# Patient Record
Sex: Female | Born: 1945 | Race: White | Hispanic: No | Smoking: Current every day smoker
Health system: Southern US, Community
[De-identification: ages and names within clinical notes are randomized; demographics above are authoritative.]

## PROBLEM LIST (undated history)

## (undated) ENCOUNTER — Encounter

## (undated) ENCOUNTER — Telehealth

## (undated) ENCOUNTER — Ambulatory Visit

## (undated) ENCOUNTER — Encounter
Attending: Student in an Organized Health Care Education/Training Program | Primary: Student in an Organized Health Care Education/Training Program

## (undated) ENCOUNTER — Ambulatory Visit: Payer: MEDICARE

## (undated) ENCOUNTER — Telehealth
Attending: Student in an Organized Health Care Education/Training Program | Primary: Student in an Organized Health Care Education/Training Program

## (undated) ENCOUNTER — Ambulatory Visit
Payer: MEDICARE | Attending: Student in an Organized Health Care Education/Training Program | Primary: Student in an Organized Health Care Education/Training Program

## (undated) ENCOUNTER — Encounter: Attending: Dermatology | Primary: Dermatology

## (undated) DIAGNOSIS — M1990 Unspecified osteoarthritis, unspecified site: Secondary | ICD-10-CM

## (undated) DIAGNOSIS — I639 Cerebral infarction, unspecified: Secondary | ICD-10-CM

## (undated) DIAGNOSIS — H9190 Unspecified hearing loss, unspecified ear: Secondary | ICD-10-CM

## (undated) DIAGNOSIS — Z974 Presence of external hearing-aid: Secondary | ICD-10-CM

## (undated) DIAGNOSIS — Z8639 Personal history of other endocrine, nutritional and metabolic disease: Secondary | ICD-10-CM

## (undated) DIAGNOSIS — I10 Essential (primary) hypertension: Secondary | ICD-10-CM

## (undated) HISTORY — DX: Cerebral infarction, unspecified: I63.9

## (undated) HISTORY — PX: ABDOMINAL HYSTERECTOMY: SHX81

---

## 2011-03-07 ENCOUNTER — Inpatient Hospital Stay: Payer: Self-pay | Admitting: Internal Medicine

## 2011-04-06 ENCOUNTER — Ambulatory Visit: Payer: Self-pay | Admitting: Internal Medicine

## 2011-08-11 LAB — CK TOTAL AND CKMB (NOT AT ARMC): CK, Total: 93 U/L (ref 21–215)

## 2011-08-11 LAB — COMPREHENSIVE METABOLIC PANEL
Albumin: 4 g/dL (ref 3.4–5.0)
Alkaline Phosphatase: 59 U/L (ref 50–136)
BUN: 26 mg/dL — ABNORMAL HIGH (ref 7–18)
Chloride: 104 mmol/L (ref 98–107)
Creatinine: 1.04 mg/dL (ref 0.60–1.30)
EGFR (African American): >60
Glucose: 83 mg/dL (ref 65–99)
Potassium: 3.7 mmol/L (ref 3.5–5.1)
SGOT(AST): 17 U/L (ref 15–37)

## 2011-08-11 LAB — URINALYSIS, COMPLETE
Bilirubin,UR: NEGATIVE
Glucose,UR: NEGATIVE mg/dL (ref 0–75)
Hyaline Cast: 3
Ketone: NEGATIVE
Leukocyte Esterase: NEGATIVE
Ph: 6 (ref 4.5–8.0)
Specific Gravity: 1.011 (ref 1.003–1.030)
Squamous Epithelial: 2

## 2011-08-11 LAB — CBC
HCT: 43.8 % (ref 35.0–47.0)
HGB: 14.8 g/dL (ref 12.0–16.0)
MCV: 100 fL (ref 80–100)
Platelet: 235 10*3/uL (ref 150–440)
RDW: 13 % (ref 11.5–14.5)
WBC: 10.9 10*3/uL (ref 3.6–11.0)

## 2011-08-11 LAB — PROTIME-INR: Prothrombin Time: 13.4 secs (ref 11.5–14.7)

## 2011-08-11 LAB — APTT: Activated PTT: 29 secs (ref 23.6–35.9)

## 2011-08-12 ENCOUNTER — Observation Stay: Payer: Self-pay | Admitting: Internal Medicine

## 2013-01-29 ENCOUNTER — Ambulatory Visit: Payer: Self-pay | Admitting: Internal Medicine

## 2013-03-02 ENCOUNTER — Ambulatory Visit: Payer: Self-pay

## 2014-04-16 DIAGNOSIS — I10 Essential (primary) hypertension: Secondary | ICD-10-CM | POA: Insufficient documentation

## 2014-04-16 DIAGNOSIS — I679 Cerebrovascular disease, unspecified: Secondary | ICD-10-CM | POA: Insufficient documentation

## 2014-04-16 DIAGNOSIS — E0520 Thyrotoxicosis with toxic multinodular goiter without thyrotoxic crisis or storm: Secondary | ICD-10-CM | POA: Insufficient documentation

## 2014-04-16 DIAGNOSIS — E785 Hyperlipidemia, unspecified: Secondary | ICD-10-CM | POA: Insufficient documentation

## 2014-10-20 NOTE — H&P (Signed)
PATIENT NAME:  Victoria Gutierrez MR#:  [id number] DATE OF BIRTH:  Jul 05, 1945  DATE OF ADMISSION:  08/12/2011  PRIMARY CARE PHYSICIAN: Dr. Lisette Grinder   CHIEF COMPLAINT: Slurred speech and disorientation.   HISTORY OF PRESENT ILLNESS: [name] is 69 year old Caucasian female with history of systemic hypertension and hyperlipidemia and chronic smoker. Patient was admitted September of last year with transient ischemic attack, however, her MRI was consistent with lacunar infarct. This time the daughter called EMS and patient was transported to the Emergency Department after an episode of slurred speech that lasted more than one hour associated with disorientation and confusion. The patient could not state her name when she was asked nor she could specify the date and could not name her son or her daughter. She was also noted to be fidgety, keep moving her left arm in a repetitive pattern and this was new. All these symptoms had resolved by the time she came to the Emergency Department. The patient denies having any headache. No blurring of vision. No double vision. No focal weakness.    REVIEW OF SYSTEMS: CONSTITUTIONAL: Denies any fever. No chills. No night sweats. No fatigue. EYES: Denies any blurring of vision. No double vision. ENT: No hearing impairment. No sore throat. No dysphagia. CARDIOVASCULAR: No chest pain. No shortness of breath. No edema. No syncope. RESPIRATORY: No cough. No sputum production. No chest pain. No shortness of breath. GASTROINTESTINAL: No abdominal pain. No nausea. No vomiting. No diarrhea. GENITOURINARY: No dysuria or frequency of urination. MUSCULOSKELETAL: No joint swelling or pain. No muscular pain or swelling. INTEGUMENTARY: No skin rash. No ulcers. NEUROLOGIC: No focal weakness. No seizure activity. No headache. She had an episode of disorientation and slurred speech as stated above. PSYCHIATRY: No anxiety. There is history of depression. ENDOCRINE: No heat or cold intolerance.  No night sweats. No nocturia.   PAST MEDICAL HISTORY:  1. History of systemic hypertension.  2. History of tobacco abuse.  3. History of hypercholesterolemia.  4. History of stroke in September 2012.   PAST SURGICAL HISTORY: History of hysterectomy.   SOCIAL HABITS: Chronic heavy smoker, 1 pack per day since age of 79. No history of alcohol or other drug abuse.   FAMILY HISTORY: Her mother suffered from stroke and hypertension.   SOCIAL HISTORY: She lives at home alone, cared by her daughter who keeps an eye on her. She works as Probation officer.   ADMISSION MEDICATIONS:  1. Simvastatin 40 mg once a day.  2. Potassium chloride 10 mEq once a day. 3. Metoprolol 100 mg twice a day. 4. Lisinopril 20 mg a day. 5. Hydrochlorothiazide 12.5 mg once a day. 6. Aspirin 325 mg once a day.  7. Citalopram 20 mg once a day.   ALLERGIES: No known drug allergies.   PHYSICAL EXAMINATION:  VITAL SIGNS: Blood pressure 157/78, respiratory rate 18, pulse 64, temperature 98.5, oxygen saturation 94%.   GENERAL APPEARANCE: Elderly female, healthy looking, in no acute distress. She appears to be upset that her daughter called EMS and brought to the Emergency Department.   HEAD/NECK: No pallor. No icterus. No cyanosis.   ENT: Hearing was normal. Nasal mucosa, lips, tongue were normal.   EYES: Normal iris and conjunctivae. Pupils about 4 mm, equal and reactive to light.   NECK: Supple. Trachea at midline. No thyromegaly. No cervical lymphadenopathy.   HEART: Normal S1, S2. No S3, S4. There is grade 2 faint systolic murmur at the aortic area. No carotid bruits.   RESPIRATORY:  Normal breathing pattern without use of accessory muscles. No rales. No wheezing.   ABDOMEN: Soft without tenderness. No hepatosplenomegaly. No masses. No hernias.   MUSCULOSKELETAL: No joint swelling. No clubbing.   SKIN: No ulcers. No subcutaneous nodules.   NEUROLOGIC: Cranial nerves II through XII were intact. No focal  motor deficit. Coordination movements and cerebellar signs were normal. Patient has no slurred speech at the time of my examination.   PSYCHIATRIC: Patient is alert, oriented to the place, the people and time. This is different than earlier when she was disoriented.   LABORATORY, DIAGNOSTIC AND RADIOLOGICAL DATA: CAT scan of the head showed no acute findings. 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 EKG showed normal sinus rhythm at rate of 62 per minute, Q waves in leads III and aVF. Incomplete right bundle branch block, otherwise unremarkable EKG and this is unchanged compared to her EKG in September 2012. Serum glucose 83, BUN 26, creatinine 1, sodium 142, potassium 3.7. Her liver function tests were normal. CPK 93. Troponin less than 0.02. CBC showed white count 10,000, hemoglobin 14, hematocrit 43, platelet count 235. Prothrombin time 13, INR 1. APTT 29. Urinalysis was unremarkable.   IMPRESSION:  1. An episode of slurred speech associated with confusion and disorientation lasted more than an hour and subsided. Symptoms are consistent with transient ischemic attack versus another lacunar infarct.  2. Systemic hypertension.  3. Hypercholesterolemia. 4. Ongoing tobacco abuse.  5. History of stroke in September 2012.   PLAN: Admit the patient to the medical floor and will place on telemetry. I will change aspirin to Plavix 75 mg a day. Continue her home medications, in particular her blood pressure medications. Frequent neurologic examination and follow up. Neurology consultation with Dr. Manuella Ghazi. I ordered MRI for the morning. The patient had echocardiogram done in September of last year and that showed normal findings, no evidence of thrombosis and ejection fraction was 55%. There is a note from the primary care that she had carotid ultrasound during that admission, however, I did not see that report in the medical records. The  patient herself indicates that she did have carotid ultrasound done at that time. Patient has to quit smoking and I placed her on nicotine patch. For deep vein thrombosis prophylaxis I placed her on Lovenox 40 mg subcutaneous once a day and for peptic ulcer disease prophylaxis on Protonix 40 mg a day.   TIME NEEDED TO EVALUATE THIS PATIENT: Took more than 55 minutes.   ____________________________ Clovis Pu. Lenore Manner, MD amd:cms D: 08/12/2011 02:23:39 ET T: 08/12/2011 08:21:17 ET JOB#: [id number]  cc: Clovis Pu. Lenore Manner, MD, <Dictator> John B. Sarina Ser, MD Mike Craze Irven Coe MD ELECTRONICALLY SIGNED 08/20/2011 22:16

## 2014-12-10 DIAGNOSIS — Z5181 Encounter for therapeutic drug level monitoring: Secondary | ICD-10-CM | POA: Diagnosis not present

## 2014-12-10 DIAGNOSIS — L400 Psoriasis vulgaris: Secondary | ICD-10-CM | POA: Diagnosis not present

## 2014-12-30 ENCOUNTER — Emergency Department
Admission: EM | Admit: 2014-12-30 | Discharge: 2014-12-30 | Disposition: A | Discharge status: Home / Self Care | Payer: Commercial Managed Care - HMO | Attending: Student | Admitting: Student

## 2014-12-30 ENCOUNTER — Emergency Department: Payer: Commercial Managed Care - HMO

## 2014-12-30 ENCOUNTER — Other Ambulatory Visit: Payer: Self-pay

## 2014-12-30 ENCOUNTER — Encounter: Payer: Self-pay | Admitting: Radiology

## 2014-12-30 DIAGNOSIS — S299XXA Unspecified injury of thorax, initial encounter: Secondary | ICD-10-CM | POA: Diagnosis not present

## 2014-12-30 DIAGNOSIS — I701 Atherosclerosis of renal artery: Secondary | ICD-10-CM | POA: Diagnosis not present

## 2014-12-30 DIAGNOSIS — R109 Unspecified abdominal pain: Secondary | ICD-10-CM | POA: Diagnosis not present

## 2014-12-30 DIAGNOSIS — I10 Essential (primary) hypertension: Secondary | ICD-10-CM | POA: Diagnosis not present

## 2014-12-30 DIAGNOSIS — M549 Dorsalgia, unspecified: Secondary | ICD-10-CM | POA: Diagnosis not present

## 2014-12-30 DIAGNOSIS — I700 Atherosclerosis of aorta: Secondary | ICD-10-CM | POA: Diagnosis not present

## 2014-12-30 DIAGNOSIS — J984 Other disorders of lung: Secondary | ICD-10-CM | POA: Diagnosis not present

## 2014-12-30 DIAGNOSIS — R112 Nausea with vomiting, unspecified: Secondary | ICD-10-CM | POA: Diagnosis not present

## 2014-12-30 DIAGNOSIS — M546 Pain in thoracic spine: Secondary | ICD-10-CM | POA: Insufficient documentation

## 2014-12-30 HISTORY — DX: Essential (primary) hypertension: I10

## 2014-12-30 LAB — URINALYSIS COMPLETE WITH MICROSCOPIC (ARMC ONLY)
Bilirubin Urine: NEGATIVE
Glucose, UA: 50 mg/dL — AB
Leukocytes, UA: NEGATIVE
NITRITE: NEGATIVE
PH: 7 (ref 5.0–8.0)
Protein, ur: 30 mg/dL — AB
Specific Gravity, Urine: 1.018 (ref 1.005–1.030)

## 2014-12-30 LAB — COMPREHENSIVE METABOLIC PANEL
ALT: 23 U/L (ref 14–54)
ANION GAP: 9 (ref 5–15)
AST: 24 U/L (ref 15–41)
Albumin: 4.4 g/dL (ref 3.5–5.0)
Alkaline Phosphatase: 58 U/L (ref 38–126)
BUN: 12 mg/dL (ref 6–20)
CO2: 27 mmol/L (ref 22–32)
Calcium: 9.1 mg/dL (ref 8.9–10.3)
Chloride: 102 mmol/L (ref 101–111)
Creatinine, Ser: 0.85 mg/dL (ref 0.44–1.00)
GFR calc non Af Amer: >60 mL/min (ref >60)
Glucose, Bld: 157 mg/dL — ABNORMAL HIGH (ref 65–99)
Potassium: 3.7 mmol/L (ref 3.5–5.1)
Sodium: 138 mmol/L (ref 135–145)
Total Bilirubin: 1 mg/dL (ref 0.3–1.2)
Total Protein: 7 g/dL (ref 6.5–8.1)

## 2014-12-30 LAB — CBC WITH DIFFERENTIAL/PLATELET
Basophils Absolute: 0 10*3/uL (ref 0–0.1)
Basophils Relative: 0 %
EOS ABS: 0.1 10*3/uL (ref 0–0.7)
Eosinophils Relative: 1 %
HCT: 44 % (ref 35.0–47.0)
HEMOGLOBIN: 15.1 g/dL (ref 12.0–16.0)
Lymphocytes Relative: 13 %
Lymphs Abs: 1.4 10*3/uL (ref 1.0–3.6)
MCH: 33.8 pg (ref 26.0–34.0)
MCHC: 34.4 g/dL (ref 32.0–36.0)
MCV: 98.3 fL (ref 80.0–100.0)
Monocytes Absolute: 0.3 10*3/uL (ref 0.2–0.9)
Monocytes Relative: 3 %
Neutro Abs: 9.1 10*3/uL — ABNORMAL HIGH (ref 1.4–6.5)
Neutrophils Relative %: 83 %
Platelets: 204 10*3/uL (ref 150–440)
RBC: 4.47 MIL/uL (ref 3.80–5.20)
RDW: 13.3 % (ref 11.5–14.5)
WBC: 10.9 10*3/uL (ref 3.6–11.0)

## 2014-12-30 LAB — LIPASE, BLOOD: Lipase: 35 U/L (ref 22–51)

## 2014-12-30 LAB — TROPONIN I

## 2014-12-30 MED ORDER — ONDANSETRON HCL 4 MG PO TABS: 4.0000 mg | ORAL_TABLET | Freq: Four times a day (QID) | ORAL | Status: DC | PRN

## 2014-12-30 MED ORDER — IOHEXOL 350 MG/ML SOLN
100.0000 mL | Freq: Once | INTRAVENOUS | Status: AC | PRN
  Administered 2014-12-30: 100 mL via INTRAVENOUS

## 2014-12-30 MED ORDER — ONDANSETRON HCL 4 MG/2ML IJ SOLN
INTRAMUSCULAR | Status: AC
  Administered 2014-12-30: 4 mg via INTRAVENOUS
  Filled 2014-12-30: qty 2

## 2014-12-30 MED ORDER — MORPHINE SULFATE 4 MG/ML IJ SOLN
INTRAMUSCULAR | Status: AC
  Administered 2014-12-30: 4 mg via INTRAVENOUS
  Filled 2014-12-30: qty 1

## 2014-12-30 MED ORDER — DIAZEPAM 2 MG PO TABS: 2.0000 mg | ORAL_TABLET | Freq: Three times a day (TID) | ORAL | Status: AC | PRN

## 2014-12-30 MED ORDER — SODIUM CHLORIDE 0.9 % IV BOLUS (SEPSIS)
1000.0000 mL | Freq: Once | INTRAVENOUS | Status: AC
  Administered 2014-12-30: 1000 mL via INTRAVENOUS

## 2014-12-30 MED ORDER — MORPHINE SULFATE 4 MG/ML IJ SOLN
4.0000 mg | Freq: Once | INTRAMUSCULAR | Status: AC
  Administered 2014-12-30: 4 mg via INTRAVENOUS

## 2014-12-30 MED ORDER — ONDANSETRON HCL 4 MG/2ML IJ SOLN
4.0000 mg | Freq: Once | INTRAMUSCULAR | Status: AC
  Administered 2014-12-30: 4 mg via INTRAVENOUS

## 2014-12-30 NOTE — ED Notes (Signed)
Pt reports bending down earlier today and having sudden onset of right sided back pain and nausea and vomiting x 2. Pt denies other symptom. No tenderness upon palpation of abd or right side of pts back.

## 2014-12-30 NOTE — ED Provider Notes (Signed)
[facility] Emergency Department Provider Note  ____________________________________________  Time seen: Approximately 6:41 PM  I have reviewed the triage vital signs and the nursing notes.   HISTORY  Chief Complaint Back Pain and Nausea    HPI Victoria Gutierrez is a 69 y.o. female with history of hypertension, hyperlipidemia, prior CVA presents for evaluation of sudden right back/flank pain which occurred just prior to arrival, constant since onset. Patient reports that she was bending down earlier when she developed the pain. It is described as sharp. Currently her pain is 10 out of 10/severe. It does not radiate. She has had associated nausea as well as 2 episodes of nonbloody nonbilious emesis. No chest pain, difficulty breathing, diarrhea, dysuria or hematuria. Prior to this morning, she had been in her usual state of health other than productive cough last week. Movement does not make her pain worse.   Past Medical History  Diagnosis Date  . Hypertension     There are no active problems to display for this patient.   No past surgical history on file.  No current outpatient prescriptions on file.  Allergies Review of patient's allergies indicates no known allergies.  No family history on file.  Social History History  Substance Use Topics  . Smoking status: Not on file  . Smokeless tobacco: Not on file  . Alcohol Use: Not on file   +smoker  Review of Systems Constitutional: No fever/chills Eyes: No visual changes. ENT: No sore throat. Cardiovascular: Denies chest pain. Respiratory: Denies shortness of breath. Gastrointestinal: No abdominal pain.  + nausea, + vomiting.  No diarrhea.  No constipation. Genitourinary: Negative for dysuria. Musculoskeletal: + for right flank/back pain. Skin: Negative for rash. Neurological: Negative for headaches, focal weakness or numbness.  10-point ROS otherwise  negative.  ____________________________________________   PHYSICAL EXAM:  VITAL SIGNS: ED Triage Vitals  Enc Vitals Group     BP 12/30/14 1839 176/85 mmHg     Pulse Rate 12/30/14 1839 62     Resp 12/30/14 1839 16     Temp 12/30/14 1839 98.5 F (36.9 C)     Temp Source 12/30/14 1839 Oral     SpO2 12/30/14 1839 99 %     Weight 12/30/14 1839 165 lb (74.844 kg)     Height 12/30/14 1839 5\' 6"  (1.676 m)     Head Cir --      Peak Flow --      Pain Score 12/30/14 1834 8     Pain Loc --      Pain Edu? --      Excl. in [place]? --     Constitutional: Alert and oriented. Appears pale and nauseated. Eyes: Conjunctivae are normal. PERRL. EOMI. Head: Atraumatic. Nose: No congestion/rhinnorhea. Mouth/Throat: Mucous membranes are moist.  Oropharynx non-erythematous. Neck: No stridor.   Cardiovascular: Normal rate, regular rhythm. Grossly normal heart sounds.  Good peripheral circulation. Respiratory: Normal respiratory effort.  No retractions. Lungs CTAB. Gastrointestinal: Soft and nontender. No distention. No abdominal bruits. No CVA tenderness. Genitourinary: deferred Musculoskeletal: No lower extremity tenderness nor edema.  No joint effusions. Neurologic:  Normal speech and language. No gross focal neurologic deficits are appreciated. Speech is normal. No gait instability. Skin:  Skin is warm, dry and intact. No rash noted. Psychiatric: Mood and affect are normal. Speech and behavior are normal.  ____________________________________________   LABS (all labs ordered are listed, but only abnormal results are displayed)  Labs Reviewed  CBC WITH DIFFERENTIAL/PLATELET - Abnormal; Notable  for the following:    Neutro Abs 9.1 (*)    All other components within normal limits  COMPREHENSIVE METABOLIC PANEL - Abnormal; Notable for the following:    Glucose, Bld 157 (*)    All other components within normal limits  URINALYSIS COMPLETEWITH MICROSCOPIC ([facility] ONLY) - Abnormal; Notable for the  following:    Color, Urine YELLOW (*)    APPearance HAZY (*)    Glucose, UA 50 (*)    Ketones, ur TRACE (*)    Hgb urine dipstick 1+ (*)    Protein, ur 30 (*)    Bacteria, UA RARE (*)    Squamous Epithelial / LPF 6-30 (*)    All other components within normal limits  LIPASE, BLOOD  TROPONIN I   ____________________________________________  EKG  ED ECG REPORT I, Joanne Gavel, the attending physician, personally viewed and interpreted this ECG.   Date: 12/30/2014  EKG Time: 18:50  Rate: 54  Rhythm: sinus bradycardia with marked sinus arrhythmia  Axis: Normal  Intervals:right bundle branch block  ST&T Change: No acute ST segment elevation, Q waves in lead 3, aVF  ____________________________________________  RADIOLOGY  CXR  IMPRESSION: Borderline cardiomegaly. No acute or active process. No fracture seen.   ____________________________________________   PROCEDURES  Procedure(s) performed: None  Critical Care performed: No  ____________________________________________   INITIAL IMPRESSION / ASSESSMENT AND PLAN / ED COURSE  Pertinent labs & imaging results that were available during my care of the patient were reviewed by me and considered in my medical decision making (see chart for details).  Victoria Gutierrez is a 69 y.o. female with history of hypertension, hyperlipidemia, prior CVA presents for evaluation of sudden right upper back/flank pain which occurred just prior to arrival. On exam, she appears quite nauseated, pale, is in distress secondary to pain. She has no tenderness to palpation throughout the back, no CVA tenderness, no change in pain with movement of the back/rotation of the torso. Plan for screening labs, EKG, CXR, urinalysis, antiemetics and pain control as well as IV fluids. Discussed EKG with Dr. Acie Fredrickson who has reviewed EKG and reports it is most consistent with right bundle-branch block, no  STEMI.  ----------------------------------------- 9:05 PM on 12/30/2014 ----------------------------------------- Labs reviewed and are generally unremarkable. Troponin negative. Urinalysis is notable for 6-30 red blood cells with multiple squamous cells. Her symptoms may represent nephrolithiasis however she is complaining of pain in the upper back just under the scapula (somewhat atypical for kidney stone) and given risk factors, somewhat ill appearance on arrival, will obtain CTA chest abdomen pelvis to rule out dissection. Pain and nausea have improved at this time. Care transferred to Dr. Joni Fears. ____________________________________________   FINAL CLINICAL IMPRESSION(S) / ED DIAGNOSES  Final diagnoses:  Flank pain, acute       Joanne Gavel, MD 12/30/14 2107

## 2014-12-30 NOTE — ED Provider Notes (Signed)
-----------------------------------------   11:29 PM on 12/30/2014 -----------------------------------------  Workup unremarkable. Repeat exam reveals patient is calm and comfortable sitting upright, drinking fluids. She has tenderness in the right paraspinous tissues of the back which reproduces her pain. This appears to be a musculoskeletal back pain and muscle strain. Workup is unremarkable and reassuring, she is medically stable good condition we'll discharge home to follow up with primary care.  Carrie Mew, MD 12/30/14 2329

## 2015-05-02 DIAGNOSIS — I679 Cerebrovascular disease, unspecified: Secondary | ICD-10-CM | POA: Diagnosis not present

## 2015-05-02 DIAGNOSIS — Z9119 Patient's noncompliance with other medical treatment and regimen: Secondary | ICD-10-CM | POA: Diagnosis not present

## 2015-05-02 DIAGNOSIS — I10 Essential (primary) hypertension: Secondary | ICD-10-CM | POA: Diagnosis not present

## 2015-05-02 DIAGNOSIS — E7800 Pure hypercholesterolemia, unspecified: Secondary | ICD-10-CM | POA: Diagnosis not present

## 2015-07-14 DIAGNOSIS — H2513 Age-related nuclear cataract, bilateral: Secondary | ICD-10-CM | POA: Diagnosis not present

## 2015-07-29 DIAGNOSIS — I10 Essential (primary) hypertension: Secondary | ICD-10-CM | POA: Diagnosis not present

## 2015-07-29 DIAGNOSIS — E7800 Pure hypercholesterolemia, unspecified: Secondary | ICD-10-CM | POA: Diagnosis not present

## 2015-08-05 DIAGNOSIS — E0520 Thyrotoxicosis with toxic multinodular goiter without thyrotoxic crisis or storm: Secondary | ICD-10-CM | POA: Diagnosis not present

## 2015-08-05 DIAGNOSIS — R319 Hematuria, unspecified: Secondary | ICD-10-CM | POA: Diagnosis not present

## 2015-08-05 DIAGNOSIS — Z720 Tobacco use: Secondary | ICD-10-CM | POA: Diagnosis not present

## 2015-08-05 DIAGNOSIS — I679 Cerebrovascular disease, unspecified: Secondary | ICD-10-CM | POA: Diagnosis not present

## 2015-08-05 DIAGNOSIS — I10 Essential (primary) hypertension: Secondary | ICD-10-CM | POA: Diagnosis not present

## 2015-08-05 DIAGNOSIS — E2839 Other primary ovarian failure: Secondary | ICD-10-CM | POA: Diagnosis not present

## 2015-08-05 DIAGNOSIS — Z0001 Encounter for general adult medical examination with abnormal findings: Secondary | ICD-10-CM | POA: Diagnosis not present

## 2015-08-05 DIAGNOSIS — E7800 Pure hypercholesterolemia, unspecified: Secondary | ICD-10-CM | POA: Diagnosis not present

## 2015-08-14 DIAGNOSIS — H04123 Dry eye syndrome of bilateral lacrimal glands: Secondary | ICD-10-CM | POA: Diagnosis not present

## 2016-02-06 DIAGNOSIS — I10 Essential (primary) hypertension: Secondary | ICD-10-CM | POA: Diagnosis not present

## 2016-02-11 DIAGNOSIS — I10 Essential (primary) hypertension: Secondary | ICD-10-CM | POA: Diagnosis not present

## 2016-02-11 DIAGNOSIS — E7800 Pure hypercholesterolemia, unspecified: Secondary | ICD-10-CM | POA: Diagnosis not present

## 2016-03-29 DIAGNOSIS — L400 Psoriasis vulgaris: Secondary | ICD-10-CM | POA: Diagnosis not present

## 2016-04-26 DIAGNOSIS — L400 Psoriasis vulgaris: Secondary | ICD-10-CM | POA: Diagnosis not present

## 2016-04-27 DIAGNOSIS — Z0000 Encounter for general adult medical examination without abnormal findings: Secondary | ICD-10-CM | POA: Diagnosis not present

## 2016-07-29 DIAGNOSIS — L400 Psoriasis vulgaris: Secondary | ICD-10-CM | POA: Diagnosis not present

## 2016-07-29 DIAGNOSIS — M2550 Pain in unspecified joint: Secondary | ICD-10-CM | POA: Diagnosis not present

## 2016-08-25 DIAGNOSIS — L409 Psoriasis, unspecified: Secondary | ICD-10-CM | POA: Diagnosis not present

## 2016-08-25 DIAGNOSIS — M2550 Pain in unspecified joint: Secondary | ICD-10-CM | POA: Diagnosis not present

## 2016-08-25 DIAGNOSIS — L408 Other psoriasis: Secondary | ICD-10-CM | POA: Diagnosis not present

## 2016-08-25 DIAGNOSIS — M151 Heberden's nodes (with arthropathy): Secondary | ICD-10-CM | POA: Diagnosis not present

## 2016-09-23 DIAGNOSIS — M19041 Primary osteoarthritis, right hand: Secondary | ICD-10-CM | POA: Diagnosis not present

## 2016-09-23 DIAGNOSIS — M19042 Primary osteoarthritis, left hand: Secondary | ICD-10-CM | POA: Diagnosis not present

## 2016-09-23 DIAGNOSIS — L409 Psoriasis, unspecified: Secondary | ICD-10-CM | POA: Diagnosis not present

## 2016-10-26 DIAGNOSIS — L400 Psoriasis vulgaris: Secondary | ICD-10-CM | POA: Diagnosis not present

## 2017-01-04 DIAGNOSIS — M25551 Pain in right hip: Secondary | ICD-10-CM | POA: Diagnosis not present

## 2017-01-04 DIAGNOSIS — G8929 Other chronic pain: Secondary | ICD-10-CM | POA: Diagnosis not present

## 2017-02-08 DIAGNOSIS — M5136 Other intervertebral disc degeneration, lumbar region: Secondary | ICD-10-CM | POA: Diagnosis not present

## 2017-02-08 DIAGNOSIS — M9902 Segmental and somatic dysfunction of thoracic region: Secondary | ICD-10-CM | POA: Diagnosis not present

## 2017-02-08 DIAGNOSIS — M5431 Sciatica, right side: Secondary | ICD-10-CM | POA: Diagnosis not present

## 2017-02-08 DIAGNOSIS — M9903 Segmental and somatic dysfunction of lumbar region: Secondary | ICD-10-CM | POA: Diagnosis not present

## 2017-02-09 DIAGNOSIS — M5431 Sciatica, right side: Secondary | ICD-10-CM | POA: Diagnosis not present

## 2017-02-09 DIAGNOSIS — M9902 Segmental and somatic dysfunction of thoracic region: Secondary | ICD-10-CM | POA: Diagnosis not present

## 2017-02-09 DIAGNOSIS — M9903 Segmental and somatic dysfunction of lumbar region: Secondary | ICD-10-CM | POA: Diagnosis not present

## 2017-02-09 DIAGNOSIS — M5136 Other intervertebral disc degeneration, lumbar region: Secondary | ICD-10-CM | POA: Diagnosis not present

## 2017-02-10 ENCOUNTER — Other Ambulatory Visit: Payer: Self-pay | Admitting: Internal Medicine

## 2017-02-10 DIAGNOSIS — M5431 Sciatica, right side: Secondary | ICD-10-CM | POA: Diagnosis not present

## 2017-02-11 DIAGNOSIS — M5136 Other intervertebral disc degeneration, lumbar region: Secondary | ICD-10-CM | POA: Diagnosis not present

## 2017-02-11 DIAGNOSIS — M9902 Segmental and somatic dysfunction of thoracic region: Secondary | ICD-10-CM | POA: Diagnosis not present

## 2017-02-11 DIAGNOSIS — M5431 Sciatica, right side: Secondary | ICD-10-CM | POA: Diagnosis not present

## 2017-02-11 DIAGNOSIS — M9903 Segmental and somatic dysfunction of lumbar region: Secondary | ICD-10-CM | POA: Diagnosis not present

## 2017-02-14 DIAGNOSIS — M9902 Segmental and somatic dysfunction of thoracic region: Secondary | ICD-10-CM | POA: Diagnosis not present

## 2017-02-14 DIAGNOSIS — M5431 Sciatica, right side: Secondary | ICD-10-CM | POA: Diagnosis not present

## 2017-02-14 DIAGNOSIS — M9903 Segmental and somatic dysfunction of lumbar region: Secondary | ICD-10-CM | POA: Diagnosis not present

## 2017-02-14 DIAGNOSIS — M5136 Other intervertebral disc degeneration, lumbar region: Secondary | ICD-10-CM | POA: Diagnosis not present

## 2017-02-17 ENCOUNTER — Ambulatory Visit
Admission: RE | Admit: 2017-02-17 | Discharge: 2017-02-17 | Disposition: A | Discharge status: Home / Self Care | Payer: Medicare HMO | Source: Ambulatory Visit | Attending: Internal Medicine | Admitting: Internal Medicine

## 2017-02-17 DIAGNOSIS — M48061 Spinal stenosis, lumbar region without neurogenic claudication: Secondary | ICD-10-CM | POA: Insufficient documentation

## 2017-02-17 DIAGNOSIS — M5126 Other intervertebral disc displacement, lumbar region: Secondary | ICD-10-CM | POA: Diagnosis not present

## 2017-02-17 DIAGNOSIS — M47896 Other spondylosis, lumbar region: Secondary | ICD-10-CM | POA: Diagnosis not present

## 2017-02-17 DIAGNOSIS — M5431 Sciatica, right side: Secondary | ICD-10-CM | POA: Diagnosis not present

## 2017-03-01 DIAGNOSIS — M5126 Other intervertebral disc displacement, lumbar region: Secondary | ICD-10-CM | POA: Diagnosis not present

## 2017-03-09 ENCOUNTER — Ambulatory Visit
Payer: Medicare HMO | Attending: Student in an Organized Health Care Education/Training Program | Admitting: Student in an Organized Health Care Education/Training Program

## 2017-03-09 ENCOUNTER — Encounter: Payer: Self-pay | Admitting: Student in an Organized Health Care Education/Training Program

## 2017-03-09 VITALS — BP 121/90 | HR 63 | Temp 98.5°F | Resp 22 | Ht 66.0 in | Wt 175.0 lb

## 2017-03-09 DIAGNOSIS — M9983 Other biomechanical lesions of lumbar region: Secondary | ICD-10-CM

## 2017-03-09 DIAGNOSIS — M79661 Pain in right lower leg: Secondary | ICD-10-CM | POA: Insufficient documentation

## 2017-03-09 DIAGNOSIS — E785 Hyperlipidemia, unspecified: Secondary | ICD-10-CM | POA: Diagnosis not present

## 2017-03-09 DIAGNOSIS — M25551 Pain in right hip: Secondary | ICD-10-CM | POA: Insufficient documentation

## 2017-03-09 DIAGNOSIS — M5126 Other intervertebral disc displacement, lumbar region: Secondary | ICD-10-CM | POA: Diagnosis not present

## 2017-03-09 DIAGNOSIS — I129 Hypertensive chronic kidney disease with stage 1 through stage 4 chronic kidney disease, or unspecified chronic kidney disease: Secondary | ICD-10-CM | POA: Diagnosis not present

## 2017-03-09 DIAGNOSIS — G8929 Other chronic pain: Secondary | ICD-10-CM | POA: Insufficient documentation

## 2017-03-09 DIAGNOSIS — M545 Low back pain: Secondary | ICD-10-CM | POA: Diagnosis not present

## 2017-03-09 DIAGNOSIS — M5116 Intervertebral disc disorders with radiculopathy, lumbar region: Secondary | ICD-10-CM | POA: Diagnosis not present

## 2017-03-09 DIAGNOSIS — M5416 Radiculopathy, lumbar region: Secondary | ICD-10-CM

## 2017-03-09 DIAGNOSIS — M48061 Spinal stenosis, lumbar region without neurogenic claudication: Secondary | ICD-10-CM | POA: Insufficient documentation

## 2017-03-09 DIAGNOSIS — Z8673 Personal history of transient ischemic attack (TIA), and cerebral infarction without residual deficits: Secondary | ICD-10-CM | POA: Diagnosis not present

## 2017-03-09 DIAGNOSIS — G458 Other transient cerebral ischemic attacks and related syndromes: Secondary | ICD-10-CM | POA: Diagnosis not present

## 2017-03-09 DIAGNOSIS — Z87442 Personal history of urinary calculi: Secondary | ICD-10-CM | POA: Insufficient documentation

## 2017-03-09 DIAGNOSIS — N189 Chronic kidney disease, unspecified: Secondary | ICD-10-CM | POA: Diagnosis not present

## 2017-03-09 NOTE — Progress Notes (Signed)
Patient's Name: Victoria Gutierrez  MRN: [id number]  Referring Provider: Madelyn Brunner, MD  DOB: [birth year]  PCP: Madelyn Brunner, MD  DOS: 03/09/2017  Note by: Gillis Santa, MD  Service setting: Ambulatory outpatient  Specialty: Interventional Pain Management  Location: [facility] (AMB) Pain Management Facility  Visit type: Initial Patient Evaluation  Patient type: New Patient   Primary Reason(s) for Visit: Encounter for initial evaluation of one or more chronic problems (new to examiner) potentially causing chronic pain, and posing a threat to normal musculoskeletal function. (Level of risk: High) CC: Back Pain (low and right); Hip Pain (right); and Leg Pain (to just below right knee)  HPI  Ms. Kun is a 71 y.o. year old, female patient, who comes today to see [place] for the first time for an initial evaluation of her chronic pain. She has Cerebral vascular disease; Hyperlipidemia; Hypertension; Localized osteoarthritis of hands, bilateral; Multiple joint pain; Psoriasis, unspecified; and Toxic multinodular goiter on her problem list. Today she comes in for evaluation of her Back Pain (low and right); Hip Pain (right); and Leg Pain (to just below right knee)  Pain Assessment: Location: Lower Back Radiating: right leg Onset: More than a month ago Duration: Chronic pain Quality: Dull, Constant, Aching, Burning Severity: 9 /10 (self-reported pain score)  Note: Reported level is compatible with observation.                   Effect on ADL:   Timing: Constant Modifying factors: rest ,medications  Onset and Duration: Sudden Cause of pain: Unknown Severity: Getting worse Timing: Not influenced by the time of the day Aggravating Factors: "always" Alleviating Factors: Lying down and Medications Associated Problems: Constipation, Dizziness and Pain that does not allow patient to sleep Quality of Pain: Aching, Disabling, Distressing, Dreadful, Lancinating, Sickening and Throbbing Previous  Examinations or Tests: MRI scan, X-rays and Chiropractic evaluation Previous Treatments: Narcotic medications, Steroid treatments by mouth and Traction  The patient comes into the clinics today for the first time for a chronic pain management evaluation.   71 year old female with a past medical history of cerebrovascular disease (on Plavix), hyperlipidemia, hypertension, psoriasis, toxic multinodular goiter who presents with right lower back pain that extends into her right hips and right thigh stopping at knee and occasionally radiates down her right leg. The pain started approximately 8 weeks ago. No inciting event, no heavy lifting or straining. Patient states that she has trouble standing up for extended periods of time. Sitting seems to help her pain. No bowel or bladder dysfunction. Patient has seen a chiropractor in the past but chiropractor was limited in what he could do secondary to patient's pain.  Patient has tried meloxicam in the past. 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  Today I took the time to provide the patient with information regarding my pain practice. The patient was informed that my practice is divided into two sections: an interventional pain management section, as well as a completely separate and distinct medication management section. I explained that I have procedure days for my interventional therapies, and evaluation days for follow-ups and medication management. Because of the amount of documentation required during both, they  are kept separated. This means that there is the possibility that she may be scheduled for a procedure on  one day, and medication management the next. I have also informed her that because of staffing and facility limitations, I no longer take patients for medication management only. To illustrate the reasons for this, I gave the patient the example of surgeons, and how inappropriate it would be to refer a patient to his/her care, just to write for the post-surgical antibiotics on a surgery done by a different surgeon.   Because interventional pain management is my board-certified specialty, the patient was informed that joining my practice means that they are open to any and all interventional therapies. I made it clear that this does not mean that they will be forced to have any procedures done. What this means is that I believe interventional therapies to be essential part of the diagnosis and proper management of chronic pain conditions. Therefore, patients not interested in these interventional alternatives will be better served under the care of a different practitioner.  The patient was also made aware of my Comprehensive Pain Management Safety Guidelines where by joining my practice, they limit all of their nerve blocks and joint injections to those done by our practice, for as long as we are retained to manage their care.   Historic Controlled Substance Pharmacotherapy Review  PMP and historical list of controlled substances:Percocet, 5 mg, quantity 60, last fill 03/01/2017 Highest opioid analgesic regimen found: As above Most recent opioid analgesic: As above Current opioid analgesics: As above Highest recorded MME/day: Approximately 15 mg mg/day MME/day: 15 mg/day Medications: The patient did not bring the medication(s) to the appointment, as requested in our "New Patient Package" Pharmacodynamics: Desired effects: Analgesia: The patient reports >50% benefit. Reported improvement in function: The patient reports medication allows her to accomplish basic ADLs. Clinically meaningful  improvement in function (CMIF): Sustained CMIF goals met Perceived effectiveness: Described as relatively effective, allowing for increase in activities of daily living (ADL) Undesirable effects: Side-effects or Adverse reactions: None reported Historical Monitoring: The patient  reports that she does not use drugs. List of all UDS Test(s): No results found for: MDMA, COCAINSCRNUR, PCPSCRNUR, PCPQUANT, CANNABQUANT, THCU, [place] List of all Serum Drug Screening Test(s):  No results found for: AMPHSCRSER, BARBSCRSER, BENZOSCRSER, COCAINSCRSER, PCPSCRSER, PCPQUANT, THCSCRSER, CANNABQUANT, OPIATESCRSER, OXYSCRSER, PROPOXSCRSER Historical Background Evaluation: [place] PDMP: Six (6) year initial data search conducted.              Department of public safety, offender search: Editor, commissioning Information) Non-contributory Risk Assessment Profile: Aberrant behavior: None observed or detected today Risk factors for fatal opioid overdose: None identified today Fatal overdose hazard ratio (HR): Calculation deferred Non-fatal overdose hazard ratio (HR): Calculation deferred Risk of opioid abuse or dependence: 0.7-3.0% with doses ? 36 MME/day and 6.1-26% with doses ? 120 MME/day. Substance use disorder (SUD) risk level: Moderate Opioid risk tool (ORT) (Total Score): 0     Opioid Risk Tool - 03/09/17 1418      Family History of Substance Abuse   Alcohol Negative   Illegal Drugs Negative   Rx Drugs Negative     Personal History of Substance Abuse   Alcohol Negative   Illegal Drugs Negative   Rx Drugs Negative     Age   Age between 43-45 years  No     History of Preadolescent Sexual Abuse   History of Preadolescent Sexual Abuse Negative or Female     Psychological Disease   Psychological  Disease Negative   Depression Negative     Total Score   Opioid Risk Tool Scoring 0   Opioid Risk Interpretation Low Risk     ORT Scoring interpretation table:  Score <3 = Low Risk for SUD  Score between 4-7 =  Moderate Risk for SUD  Score >8 = High Risk for Opioid Abuse    Meds   Current Outpatient Prescriptions:  .  citalopram (CELEXA) 20 MG tablet, TAKE 1 TABLET EVERY NIGHT, Disp: , Rfl:  .  clopidogrel (PLAVIX) 75 MG tablet, TAKE 1 TABLET EVERY DAY, Disp: , Rfl:  .  lisinopril (PRINIVIL,ZESTRIL) 20 MG tablet, TAKE 1 TABLET EVERY DAY, Disp: , Rfl:  .  Melatonin 5 MG TBDP, , Disp: , Rfl:  .  meloxicam (MOBIC) 7.5 MG tablet, TAKE 1 TABLET ONCE DAILY WITH FOOD AS NEEDED FOR JOINT PAIN , Disp: , Rfl:  .  metoprolol tartrate (LOPRESSOR) 50 MG tablet, TAKE 1 TABLET TWICE DAILY, Disp: , Rfl:  .  oxyCODONE-acetaminophen (PERCOCET/ROXICET) 5-325 MG tablet, Take 1 tablet by mouth every 6 (six) hours as needed for severe pain., Disp: , Rfl:  .  ondansetron (ZOFRAN) 4 MG tablet, Take 1 tablet (4 mg total) by mouth every 6 (six) hours as needed for nausea or vomiting., Disp: 20 tablet, Rfl: 1  Imaging Review    Lumbosacral Imaging: Lumbar MR wo contrast:  Results for orders placed during the hospital encounter of 02/17/17  MR LUMBAR SPINE WO CONTRAST   Narrative CLINICAL DATA:  One month history of right leg sciatica to the calf.  EXAM: MRI LUMBAR SPINE WITHOUT CONTRAST  TECHNIQUE: Multiplanar, multisequence MR imaging of the lumbar spine was performed. No intravenous contrast was administered.  COMPARISON:  CT chest, abdomen, and pelvis dated December 30, 2014.  FINDINGS: Segmentation:  Standard.  Alignment: Trace anterolisthesis of L4 on L5. Otherwise physiologic.  Vertebrae: Marrow heterogeneity is present. No fracture, evidence of discitis, or focal suspicious bone lesion. Scattered hemangiomas throughout the vertebral bodies.  Conus medullaris: Extends to the L1-L2 level and appears normal.  Paraspinal and other soft tissues: Negative.  Disc levels:  T11-T12: Only seen on the sagittal images. No spinal canal or neuroforaminal stenosis.  T12-L1: Only seen on the sagittal images. No  spinal canal or neuroforaminal stenosis.  L1-L2:  No spinal canal or neuroforaminal stenosis.  L2-L3: Diffuse disc bulge resulting in mild central spinal canal stenosis and mild left lateral recess narrowing. No significant neuroforaminal stenosis.  L3-L4: Diffuse disc bulge with superimposed right foraminal disc extrusion migrating superiorly resulting in severe right neuroforaminal stenosis and impingement on the exiting L3 nerve root. There is moderate right lateral recess narrowing and mild central spinal canal stenosis. Mild left neuroforaminal stenosis.  L4-L5: Small diffuse disc bulge and mild bilateral facet arthropathy resulting in mild bilateral neuroforaminal stenosis. No significant central spinal canal stenosis.  L5-S1: Small disc bulge and bilateral facet arthropathy resulting in mild left neuroforaminal stenosis. No significant central spinal canal or right neuroforaminal stenosis.  IMPRESSION: 1. Multilevel degenerative changes throughout the lumbar spine, as described above, with disc bulging and superimposed right foraminal disc extrusion at L3-L4 resulting in severe right neuroforaminal stenosis and impingement on the exiting L3 nerve root. 2. Mild central spinal canal stenosis at L2-L3 and L3-L4. 3. Marrow heterogeneity is present. Although this can be caused by marrow infiltrative processes, the most common causes include anemia, smoking, obesity, or advancing age.   Electronically Signed   By: Orville Govern.D.  On: 02/17/2017 09:32      Complexity Note: Imaging results reviewed. Results discussed using Layman's terms.               ROS  Cardiovascular History: High blood pressure and Blood thinners:  Anticoagulant Pulmonary or Respiratory History: Smoking Neurological History: Stroke (Residual deficits or weakness: first TIA 3 years ago) Review of Past Neurological Studies: No results found for this or any previous  visit. Psychological-Psychiatric History: No reported psychological or psychiatric signs or symptoms such as difficulty sleeping, anxiety, depression, delusions or hallucinations (schizophrenial), mood swings (bipolar disorders) or suicidal ideations or attempts Gastrointestinal History: No reported gastrointestinal signs or symptoms such as vomiting or evacuating blood, reflux, heartburn, alternating episodes of diarrhea and constipation, inflamed or scarred liver, or pancreas or irrregular and/or infrequent bowel movements Genitourinary History: No reported renal or genitourinary signs or symptoms such as difficulty voiding or producing urine, peeing blood, non-functioning kidney, kidney stones, difficulty emptying the bladder, difficulty controlling the flow of urine, or chronic kidney disease Hematological History: No reported hematological signs or symptoms such as prolonged bleeding, low or poor functioning platelets, bruising or bleeding easily, hereditary bleeding problems, low energy levels due to low hemoglobin or being anemic Endocrine History: No reported endocrine signs or symptoms such as high or low blood sugar, rapid heart rate due to high thyroid levels, obesity or weight gain due to slow thyroid or thyroid disease Rheumatologic History: Joint aches and or swelling due to excess weight (Osteoarthritis) Musculoskeletal History: Negative for myasthenia gravis, muscular dystrophy, multiple sclerosis or malignant hyperthermia Work History: Retired  Allergies  Ms. Maultsby has No Known Allergies.  Laboratory Chemistry  Inflammation Markers (CRP: Acute Phase) (ESR: Chronic Phase) No results found for: CRP, ESRSEDRATE               Renal Function Markers Lab Results  Component Value Date   BUN 12 12/30/2014   CREATININE 0.85 12/30/2014   GFRAA >60 12/30/2014   GFRNONAA >60 12/30/2014                 Hepatic Function Markers Lab Results  Component Value Date   AST 24 12/30/2014    ALT 23 12/30/2014   ALBUMIN 4.4 12/30/2014   ALKPHOS 58 12/30/2014                 Electrolytes Lab Results  Component Value Date   NA 138 12/30/2014   K 3.7 12/30/2014   CL 102 12/30/2014   CALCIUM 9.1 12/30/2014                 Neuropathy Markers No results found for: [id number]               Bone Pathology Markers Lab Results  Component Value Date   ALKPHOS 58 12/30/2014   CALCIUM 9.1 12/30/2014                 Coagulation Parameters Lab Results  Component Value Date   INR 1.0 08/11/2011   LABPROT 13.4 08/11/2011   APTT 29.0 08/11/2011   PLT 204 12/30/2014                 Cardiovascular Markers Lab Results  Component Value Date   HGB 15.1 12/30/2014   HCT 44.0 12/30/2014                 Note: Lab results reviewed.  [place]  Drug: Ms. Brissett  reports that she does not use drugs. Alcohol:  reports that she does not drink alcohol. Tobacco:  reports that she has been smoking Cigarettes.  She started smoking about 30 years ago. She has a 15.00 pack-year smoking history. She has never used smokeless tobacco. Medical:  has a past medical history of Hypertension. Family: family history includes Heart disease in her father and mother.  Past Surgical History:  Procedure Laterality Date  . ABDOMINAL HYSTERECTOMY     Active Ambulatory Problems    Diagnosis Date Noted  . Cerebral vascular disease 04/16/2014  . Hyperlipidemia 04/16/2014  . Hypertension 04/16/2014  . Localized osteoarthritis of hands, bilateral 09/23/2016  . Multiple joint pain 08/25/2016  . Psoriasis, unspecified 08/25/2016  . 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   Resolved Ambulatory Problems    Diagnosis Date Noted  . No Resolved Ambulatory Problems   Past Medical History:  Diagnosis Date  . Hypertension    Constitutional Exam  General appearance: Well nourished, well developed, and well hydrated. In no apparent acute distress Vitals:   03/09/17 1357  BP: 121/90  Pulse: 63  Resp: (!) 22   Temp: 98.5 F (36.9 C)  TempSrc: Oral  SpO2: 100%  Weight: 175 lb (79.4 kg)  Height: 5' 6"  (1.676 m)   BMI Assessment: Estimated body mass index is 28.25 kg/m as calculated from the following:   Height as of this encounter: 5' 6"  (1.676 m).   Weight as of this encounter: 175 lb (79.4 kg).  BMI interpretation table: BMI level Category Range association with higher incidence of chronic pain  <18 kg/m2 Underweight   18.5-24.9 kg/m2 Ideal body weight   25-29.9 kg/m2 Overweight Increased incidence by 20%  30-34.9 kg/m2 Obese (Class I) Increased incidence by 68%  35-39.9 kg/m2 Severe obesity (Class II) Increased incidence by 136%  >40 kg/m2 Extreme obesity (Class III) Increased incidence by 254%   BMI Readings from Last 4 Encounters:  03/09/17 28.25 kg/m  12/30/14 26.63 kg/m   Wt Readings from Last 4 Encounters:  03/09/17 175 lb (79.4 kg)  12/30/14 165 lb (74.8 kg)  Psych/Mental status: Alert, oriented x 3 (person, place, & time)       Eyes: PERLA Respiratory: No evidence of acute respiratory distress  Cervical Spine Area Exam  Skin & Axial Inspection: No masses, redness, edema, swelling, or associated skin lesions Alignment: Symmetrical Functional ROM: Unrestricted ROM      Stability: No instability detected Muscle Tone/Strength: Functionally intact. No obvious neuro-muscular anomalies detected. Sensory (Neurological): Unimpaired Palpation: No palpable anomalies              Upper Extremity (UE) Exam    Side: Right upper extremity  Side: Left upper extremity  Skin & Extremity Inspection: Skin color, temperature, and hair growth are WNL. No peripheral edema or cyanosis. No masses, redness, swelling, asymmetry, or associated skin lesions. No contractures.  Skin & Extremity Inspection: Skin color, temperature, and hair growth are WNL. No peripheral edema or cyanosis. No masses, redness, swelling, asymmetry, or associated skin lesions. No contractures.  Functional ROM:  Unrestricted ROM          Functional ROM: Unrestricted ROM          Muscle Tone/Strength: Functionally intact. No obvious neuro-muscular anomalies detected.  Muscle Tone/Strength: Functionally intact. No obvious neuro-muscular anomalies detected.  Sensory (Neurological): Unimpaired          Sensory (Neurological): Unimpaired          Palpation: No palpable anomalies  Palpation: No palpable anomalies              Specialized Test(s): Deferred         Specialized Test(s): Deferred          Thoracic Spine Area Exam  Skin & Axial Inspection: No masses, redness, or swelling Alignment: Symmetrical Functional ROM: Unrestricted ROM Stability: No instability detected Muscle Tone/Strength: Functionally intact. No obvious neuro-muscular anomalies detected. Sensory (Neurological): Unimpaired Muscle strength & Tone: No palpable anomalies  Lumbar Spine Area Exam  Skin & Axial Inspection: No masses, redness, or swelling Alignment: Asymmetric Functional ROM: Decreased ROM      Stability: No instability detected Muscle Tone/Strength: Functionally intact. No obvious neuro-muscular anomalies detected. Sensory (Neurological): Dermatomal pain pattern as well as articular pain pattern Palpation: Complains of area being tender to palpation Bilateral Fist Percussion Test Provocative Tests: Lumbar Hyperextension and rotation test: Positive on the right for facet joint pain. Lumbar Lateral bending test: Positive ipsilateral radicular pain, on the right. Positive for right-sided foraminal stenosis. Patrick's Maneuver: evaluation deferred today                    Gait & Posture Assessment  Ambulation: Unassisted Gait: Relatively normal for age and body habitus Posture: WNL   Lower Extremity Exam    Side: Right lower extremity  Side: Left lower extremity  Skin & Extremity Inspection: Skin color, temperature, and hair growth are WNL. No peripheral edema or cyanosis. No masses, redness, swelling,  asymmetry, or associated skin lesions. No contractures.  Skin & Extremity Inspection: Skin color, temperature, and hair growth are WNL. No peripheral edema or cyanosis. No masses, redness, swelling, asymmetry, or associated skin lesions. No contractures.  Functional ROM: Pain restricted ROM for all joints of the lower extremity  Functional ROM: Unrestricted ROM          Muscle Tone/Strength: L3 weakness  Muscle Tone/Strength: Functionally intact. No obvious neuro-muscular anomalies detected.  Sensory (Neurological): Dermatomal pain pattern  Sensory (Neurological): Unimpaired  Palpation: No palpable anomalies  Palpation: No palpable anomalies   Assessment  Primary Diagnosis & Pertinent Problem List: The primary encounter diagnosis was Lumbar radiculopathy. Diagnoses of Herniated lumbar intervertebral disc and Neuroforaminal stenosis of lumbar spine were also pertinent to this visit.  Visit Diagnosis (New problems to examiner): 1. Lumbar radiculopathy   2. Herniated lumbar intervertebral disc   3. Neuroforaminal stenosis of lumbar spine    Plan of Care (Initial workup plan)   71 year old female with a past medical history of cerebrovascular disease (on Plavix), hyperlipidemia, hypertension, psoriasis, toxic multinodular goiter who presents with right lower back pain that extends into her right hips and right thigh stopping at knee and occasionally radiates down her right leg. The pain started approximately 8 weeks ago. No inciting event, no heavy lifting or straining. Patient states that she has trouble standing up for extended periods of time. Sitting seems to help her pain. No bowel or bladder dysfunction. Patient has seen a chiropractor in the past but chiropractor was limited in what he could do secondary to patient's pain.  Patient has tried meloxicam in the past. Patient did have a lumbar MRI performed results of which are below but to summarize showed multilevel degenerative disc disease  throughout the lumbar spine with disc bulging and superimposed right foraminal disc extrusion at L3-L4 resulting in severe right neuroforaminal stenosis and impingement on the exiting L3 nerve root along with mild central canal stenosis at L2-L3 and L3-L4.  Patient was  expecting a lumbar epidural steroid injection today however I informed her that this was an evaluation in visit only. She was somewhat disappointed. I explained to her that she is currently on Plavix and that this precludes [place] from safely doing a lumbar epidural steroid injection. She has been on Plavix for the last 3 years for TIAs that she is still continuing to have. She is somewhat nervous about coming off of her Plavix given that she is still having many TIAs. I have told her that for me to do the procedure safely she will need to be off of her Plavix for 7 days. I've encouraged her to discuss this with her primary care physician and her neurosurgeon. We discussed the potential risks of coming off Plavix for her elective procedure as well as the perceived potential benefit that she could get from the procedure. The patient states that she will discuss this with her sister and her other caregivers further.  I will place a when necessary order for an L3-L4 lumbar epidural steroid injection targeting the right side. Once the patient has been cleared to calm off of Plavix, she can call our clinic to schedule her procedure. It is ok for patient to be on ASA 81 mg for procedure if discontinuing Plavix for 7 days.  Plan: -RIGHT L3-L4 lumbar epidural steroid injection pending discontinuation of Plavix for 7 days prior to procedure based on ASRA Neuraxial Anticoagulation Guidelines. Patient will discuss this with her primary care physician and her neurosurgeon especially since she is having mini TIAs and the risk of having a stroke while being off of Plavix will be higher in her if she wants to have procedure performed. Patient made aware of this  and understands risk of coming of Plavix for procedure.  -Ok for patient to be on ASA 81 mg for procedure if discontinuing Plavix for 7 days prior to procedure   Ordered Lab-work, Procedure(s), Referral(s), & Consult(s): Orders Placed This Encounter  Procedures  . Lumbar Epidural Injection   Ms. Porchia was informed that there is no guarantee that she would be a candidate for interventional therapies. The decision will be based on the results of diagnostic studies, as well as Ms. Louderback risk profile.  Procedure(s) under consideration:  -Right L3-L4 lumbar epidural steroid injection pending discontinuation of Plavix   Provider-requested follow-up: No Follow-up on file.  No future appointments.  Primary Care Physician: Madelyn Brunner, MD Location: [facility] Note by: Gillis Santa, M.D, Date: 03/09/2017; Time: 3:27 PM  Patient Instructions    It was nice meeting you today. As we discussed, to performing the lumbar epidural steroid injection safely, you will need to be off of your Plavix for 7 days.  Please have a discussion with your primary care physician and or neurosurgeon regarding the risks and benefits of discontinuing Plavix given your history of mini strokes.  I will place a order for a lumbar epidural steroid injection. If it is safe for you to stop your Plavix, and he would like to proceed with lumbar epidural steroid injection, please call our clinic to schedule that injection 7 days after you have stopped Plavix.    Pain Management Discharge Instructions  General Discharge Instructions :  If you need to reach your doctor call: Monday-Friday 8:00 am - 4:00 pm at [phone] or toll free [phone].  After clinic hours [phone] to have operator reach doctor.  Bring all of your medication bottles to all your appointments in the  pain clinic.  To cancel or reschedule your appointment with Pain Management please remember to call 24  hours in advance to avoid a fee.  Refer to the educational materials which you have been given on: General Risks, I had my Procedure. Discharge Instructions, Post Sedation.  Post Procedure Instructions:  The drugs you were given will stay in your system until tomorrow, so for the next 24 hours you should not drive, make any legal decisions or drink any alcoholic beverages.  You may eat anything you prefer, but it is better to start with liquids then soups and crackers, and gradually work up to solid foods.  Please notify your doctor immediately if you have any unusual bleeding, trouble breathing or pain that is not related to your normal pain.  Depending on the type of procedure that was done, some parts of your body may feel week and/or numb.  This usually clears up by tonight or the next day.  Walk with the use of an assistive device or accompanied by an adult for the 24 hours.  You may use ice on the affected area for the first 24 hours.  Put ice in a Ziploc bag and cover with a towel and place against area 15 minutes on 15 minutes off.  You may switch to heat after 24 hours.Epidural Steroid Injection Patient Information  Description: The epidural space surrounds the nerves as they exit the spinal cord.  In some patients, the nerves can be compressed and inflamed by a bulging disc or a tight spinal canal (spinal stenosis).  By injecting steroids into the epidural space, we can bring irritated nerves into direct contact with a potentially helpful medication.  These steroids act directly on the irritated nerves and can reduce swelling and inflammation which often leads to decreased pain.  Epidural steroids may be injected anywhere along the spine and from the neck to the low back depending upon the location of your pain.   After numbing the skin with local anesthetic (like Novocaine), a small needle is passed into the epidural space slowly.  You may experience a sensation of pressure while this  is being done.  The entire block usually last less than 10 minutes.  Conditions which may be treated by epidural steroids:   Low back and leg pain  Neck and arm pain  Spinal stenosis  Post-laminectomy syndrome  Herpes zoster (shingles) pain  Pain from compression fractures  Preparation for the injection:  1. Do not eat any solid food or dairy products within 8 hours of your appointment.  2. You may drink clear liquids up to 3 hours before appointment.  Clear liquids include water, black coffee, juice or soda.  No milk or cream please. 3. You may take your regular medication, including pain medications, with a sip of water before your appointment  Diabetics should hold regular insulin (if taken separately) and take 1/2 normal NPH dos the morning of the procedure.  Carry some sugar containing items with you to your appointment. 4. A driver must accompany you and be prepared to drive you home after your procedure.  5. Bring all your current medications with your. 6. An IV may be inserted and sedation may be given at the discretion of the physician.   7. A blood pressure cuff, EKG and other monitors will often be applied during the procedure.  Some patients may need to have extra oxygen administered for a short period. 8. You will be asked to provide medical information, including  your allergies, prior to the procedure.  We must know immediately if you are taking blood thinners (like Coumadin/Warfarin)  Or if you are allergic to IV iodine contrast (dye). We must know if you could possible be pregnant.  Possible side-effects:  Bleeding from needle site  Infection (rare, may require surgery)  Nerve injury (rare)  Numbness & tingling (temporary)  Difficulty urinating (rare, temporary)  Spinal headache ( a headache worse with upright posture)  Light -headedness (temporary)  Pain at injection site (several days)  Decreased blood pressure (temporary)  Weakness in arm/leg  (temporary)  Pressure sensation in back/neck (temporary)  Call if you experience:  Fever/chills associated with headache or increased back/neck pain.  Headache worsened by an upright position.  New onset weakness or numbness of an extremity below the injection site  Hives or difficulty breathing (go to the emergency room)  Inflammation or drainage at the infection site  Severe back/neck pain  Any new symptoms which are concerning to you  Please note:  Although the local anesthetic injected can often make your back or neck feel good for several hours after the injection, the pain will likely return.  It takes 3-7 days for steroids to work in the epidural space.  You may not notice any pain relief for at least that one week.  If effective, we will often do a series of three injections spaced 3-6 weeks apart to maximally decrease your pain.  After the initial series, we generally will wait several months before considering a repeat injection of the same type.  If you have any questions, please call [phone] [place]  What are the risk, side effects and possible complications? Generally speaking, most procedures are safe.  However, with any procedure there are risks, side effects, and the possibility of complications.  The risks and complications are dependent upon the sites that are lesioned, or the type of nerve block to be performed.  The closer the procedure is to the spine, the more serious the risks are.  Great care is taken when placing the radio frequency needles, block needles or lesioning probes, but sometimes complications can occur. 1. Infection: Any time there is an injection through the skin, there is a risk of infection.  This is why sterile conditions are used for these blocks.  There are four possible types of infection. 1. Localized skin infection. 2. Central Nervous System Infection-This can be in  the form of Meningitis, which can be deadly. 3. Epidural Infections-This can be in the form of an epidural abscess, which can cause pressure inside of the spine, causing compression of the spinal cord with subsequent paralysis. This would require an emergency surgery to decompress, and there are no guarantees that the patient would recover from the paralysis. 4. Discitis-This is an infection of the intervertebral discs.  It occurs in about 1% of discography procedures.  It is difficult to treat and it may lead to surgery.        2. Pain: the needles have to go through skin and soft tissues, will cause soreness.       3. Damage to internal structures:  The nerves to be lesioned may be near blood vessels or    other nerves which can be potentially damaged.       4. Bleeding: Bleeding is more common if the patient is taking blood thinners such as  aspirin, Coumadin, Ticiid, Plavix, etc., or if he/she have some genetic  predisposition  such as hemophilia. Bleeding into the spinal canal can cause compression of the spinal  cord with subsequent paralysis.  This would require an emergency surgery to  decompress and there are no guarantees that the patient would recover from the  paralysis.       5. Pneumothorax:  Puncturing of a lung is a possibility, every time a needle is introduced in  the area of the chest or upper back.  Pneumothorax refers to free air around the  collapsed lung(s), inside of the thoracic cavity (chest cavity).  Another two possible  complications related to a similar event would include: Hemothorax and Chylothorax.   These are variations of the Pneumothorax, where instead of air around the collapsed  lung(s), you may have blood or chyle, respectively.       6. Spinal headaches: They may occur with any procedures in the area of the spine.       7. Persistent CSF (Cerebro-Spinal Fluid) leakage: This is a rare problem, but may occur  with prolonged intrathecal or epidural catheters either due  to the formation of a fistulous  track or a dural tear.       8. Nerve damage: By working so close to the spinal cord, there is always a possibility of  nerve damage, which could be as serious as a permanent spinal cord injury with  paralysis.       9. Death:  Although rare, severe deadly allergic reactions known as "Anaphylactic  reaction" can occur to any of the medications used.      10. Worsening of the symptoms:  We can always make thing worse.  What are the chances of something like this happening? Chances of any of this occuring are extremely low.  By statistics, you have more of a chance of getting killed in a motor vehicle accident: while driving to the hospital than any of the above occurring .  Nevertheless, you should be aware that they are possibilities.  In general, it is similar to taking a shower.  Everybody knows that you can slip, hit your head and get killed.  Does that mean that you should not shower again?  Nevertheless always keep in mind that statistics do not mean anything if you happen to be on the wrong side of them.  Even if a procedure has a 1 (one) in a 1,000,000 (million) chance of going wrong, it you happen to be that one..Also, keep in mind that by statistics, you have more of a chance of having something go wrong when taking medications.  Who should not have this procedure? If you are on a blood thinning medication (e.g. Coumadin, Plavix, see list of "Blood Thinners"), or if you have an active infection going on, you should not have the procedure.  If you are taking any blood thinners, please inform your physician.  How should I prepare for this procedure?  Do not eat or drink anything at least six hours prior to the procedure.  Bring a driver with you .  It cannot be a taxi.  Come accompanied by an adult that can drive you back, and that is strong enough to help you if your legs get weak or numb from the local anesthetic.  Take all of your medicines the morning of  the procedure with just enough water to swallow them.  If you have diabetes, make sure that you are scheduled to have your procedure done first thing in the morning, whenever possible.  If you have diabetes, take only half of your insulin dose and notify our nurse that you have done so as soon as you arrive at the clinic.  If you are diabetic, but only take blood sugar pills (oral hypoglycemic), then do not take them on the morning of your procedure.  You may take them after you have had the procedure.  Do not take aspirin or any aspirin-containing medications, at least eleven (11) days prior to the procedure.  They may prolong bleeding.  Wear loose fitting clothing that may be easy to take off and that you would not mind if it got stained with Betadine or blood.  Do not wear any jewelry or perfume  Remove any nail coloring.  It will interfere with some of our monitoring equipment.  NOTE: Remember that this is not meant to be interpreted as a complete list of all possible complications.  Unforeseen problems may occur.  BLOOD THINNERS The following drugs contain aspirin or other products, which can cause increased bleeding during surgery and should not be taken for 2 weeks prior to and 1 week after surgery.  If you should need take something for relief of minor pain, you may take acetaminophen which is found in Tylenol,m Datril, Anacin-3 and Panadol. It is not blood thinner. The products listed below are.  Do not take any of the products listed below in addition to any listed on your instruction sheet.  A.P.C or A.P.C with Codeine Codeine Phosphate Capsules #3 Ibuprofen Ridaura  ABC compound Congesprin Imuran rimadil  Advil Cope Indocin Robaxisal  Alka-Seltzer Effervescent Pain Reliever and Antacid Coricidin or Coricidin-D  Indomethacin Rufen  Alka-Seltzer plus Cold Medicine Cosprin Ketoprofen S-A-C Tablets  Anacin Analgesic Tablets or Capsules Coumadin Korlgesic Salflex  Anacin Extra  Strength Analgesic tablets or capsules CP-2 Tablets Lanoril Salicylate  Anaprox Cuprimine Capsules Levenox Salocol  Anexsia-D Dalteparin Magan Salsalate  Anodynos Darvon compound Magnesium Salicylate Sine-off  Ansaid Dasin Capsules Magsal Sodium Salicylate  Anturane Depen Capsules Marnal Soma  APF Arthritis pain formula Dewitt's Pills Measurin Stanback  Argesic Dia-Gesic Meclofenamic Sulfinpyrazone  Arthritis Bayer Timed Release Aspirin Diclofenac Meclomen Sulindac  Arthritis pain formula Anacin Dicumarol Medipren Supac  Analgesic (Safety coated) Arthralgen Diffunasal Mefanamic Suprofen  Arthritis Strength Bufferin Dihydrocodeine Mepro Compound Suprol  Arthropan liquid Dopirydamole Methcarbomol with Aspirin Synalgos  ASA tablets/Enseals Disalcid Micrainin Tagament  Ascriptin Doan's Midol Talwin  Ascriptin A/D Dolene Mobidin Tanderil  Ascriptin Extra Strength Dolobid Moblgesic Ticlid  Ascriptin with Codeine Doloprin or Doloprin with Codeine Momentum Tolectin  Asperbuf Duoprin Mono-gesic Trendar  Aspergum Duradyne Motrin or Motrin IB Triminicin  Aspirin plain, buffered or enteric coated Durasal Myochrisine Trigesic  Aspirin Suppositories Easprin Nalfon Trillsate  Aspirin with Codeine Ecotrin Regular or Extra Strength Naprosyn Uracel  Atromid-S Efficin Naproxen Ursinus  Auranofin Capsules Elmiron Neocylate Vanquish  Axotal Emagrin Norgesic Verin  Azathioprine Empirin or Empirin with Codeine Normiflo Vitamin E  Azolid Emprazil Nuprin Voltaren  Bayer Aspirin plain, buffered or children's or timed BC Tablets or powders Encaprin Orgaran Warfarin Sodium  Buff-a-Comp Enoxaparin Orudis Zorpin  Buff-a-Comp with Codeine Equegesic Os-Cal-Gesic   Buffaprin Excedrin plain, buffered or Extra Strength Oxalid   Bufferin Arthritis Strength Feldene Oxphenbutazone   Bufferin plain or Extra Strength Feldene Capsules Oxycodone with Aspirin   Bufferin with Codeine Fenoprofen Fenoprofen Pabalate or  Pabalate-SF   Buffets II Flogesic Panagesic   Buffinol plain or Extra Strength Florinal or Florinal with Codeine Panwarfarin   Buf-Tabs Flurbiprofen Penicillamine   Butalbital  Compound Four-way cold tablets Penicillin   Butazolidin Fragmin Pepto-Bismol   Carbenicillin Geminisyn Percodan   Carna Arthritis Reliever Geopen Persantine   Carprofen Gold's salt Persistin   Chloramphenicol Goody's Phenylbutazone   Chloromycetin Haltrain Piroxlcam   Clmetidine heparin Plaquenil   Cllnoril Hyco-pap Ponstel   Clofibrate Hydroxy chloroquine Propoxyphen         Before stopping any of these medications, be sure to consult the physician who ordered them.  Some, such as Coumadin (Warfarin) are ordered to prevent or treat serious conditions such as "deep thrombosis", "pumonary embolisms", and other heart problems.  The amount of time that you may need off of the medication may also vary with the medication and the reason for which you were taking it.  If you are taking any of these medications, please make sure you notify your pain physician before you undergo any procedures.

## 2017-03-09 NOTE — Patient Instructions (Addendum)
It was nice meeting you today. As we discussed, to performing the lumbar epidural steroid injection safely, you will need to be off of your Plavix for 7 days.  Please have a discussion with your primary care physician and or neurosurgeon regarding the risks and benefits of discontinuing Plavix given your history of mini strokes.  I will place a order for a lumbar epidural steroid injection. If it is safe for you to stop your Plavix, and he would like to proceed with lumbar epidural steroid injection, please call our clinic to schedule that injection 7 days after you have stopped Plavix.    Pain Management Discharge Instructions  General Discharge Instructions :  If you need to reach your doctor call: Monday-Friday 8:00 am - 4:00 pm at [phone] or toll free [phone].  After clinic hours [phone] to have operator reach doctor.  Bring all of your medication bottles to all your appointments in the pain clinic.  To cancel or reschedule your appointment with Pain Management please remember to call 24 hours in advance to avoid a fee.  Refer to the educational materials which you have been given on: General Risks, I had my Procedure. Discharge Instructions, Post Sedation.  Post Procedure Instructions:  The drugs you were given will stay in your system until tomorrow, so for the next 24 hours you should not drive, make any legal decisions or drink any alcoholic beverages.  You may eat anything you prefer, but it is better to start with liquids then soups and crackers, and gradually work up to solid foods.  Please notify your doctor immediately if you have any unusual bleeding, trouble breathing or pain that is not related to your normal pain.  Depending on the type of procedure that was done, some parts of your body may feel week and/or numb.  This usually clears up by tonight or the next day.  Walk with the use of an assistive device or accompanied by an adult for the 24  hours.  You may use ice on the affected area for the first 24 hours.  Put ice in a Ziploc bag and cover with a towel and place against area 15 minutes on 15 minutes off.  You may switch to heat after 24 hours.Epidural Steroid Injection Patient Information  Description: The epidural space surrounds the nerves as they exit the spinal cord.  In some patients, the nerves can be compressed and inflamed by a bulging disc or a tight spinal canal (spinal stenosis).  By injecting steroids into the epidural space, we can bring irritated nerves into direct contact with a potentially helpful medication.  These steroids act directly on the irritated nerves and can reduce swelling and inflammation which often leads to decreased pain.  Epidural steroids may be injected anywhere along the spine and from the neck to the low back depending upon the location of your pain.   After numbing the skin with local anesthetic (like Novocaine), a small needle is passed into the epidural space slowly.  You may experience a sensation of pressure while this is being done.  The entire block usually last less than 10 minutes.  Conditions which may be treated by epidural steroids:   Low back and leg pain  Neck and arm pain  Spinal stenosis  Post-laminectomy syndrome  Herpes zoster (shingles) pain  Pain from compression fractures  Preparation for the injection:  1. Do not eat any solid food or dairy products within 8 hours of your appointment.  2. You  may drink clear liquids up to 3 hours before appointment.  Clear liquids include water, black coffee, juice or soda.  No milk or cream please. 3. You may take your regular medication, including pain medications, with a sip of water before your appointment  Diabetics should hold regular insulin (if taken separately) and take 1/2 normal NPH dos the morning of the procedure.  Carry some sugar containing items with you to your appointment. 4. A driver must accompany you and be  prepared to drive you home after your procedure.  5. Bring all your current medications with your. 6. An IV may be inserted and sedation may be given at the discretion of the physician.   7. A blood pressure cuff, EKG and other monitors will often be applied during the procedure.  Some patients may need to have extra oxygen administered for a short period. 8. You will be asked to provide medical information, including your allergies, prior to the procedure.  We must know immediately if you are taking blood thinners (like Coumadin/Warfarin)  Or if you are allergic to IV iodine contrast (dye). We must know if you could possible be pregnant.  Possible side-effects:  Bleeding from needle site  Infection (rare, may require surgery)  Nerve injury (rare)  Numbness & tingling (temporary)  Difficulty urinating (rare, temporary)  Spinal headache ( a headache worse with upright posture)  Light -headedness (temporary)  Pain at injection site (several days)  Decreased blood pressure (temporary)  Weakness in arm/leg (temporary)  Pressure sensation in back/neck (temporary)  Call if you experience:  Fever/chills associated with headache or increased back/neck pain.  Headache worsened by an upright position.  New onset weakness or numbness of an extremity below the injection site  Hives or difficulty breathing (go to the emergency room)  Inflammation or drainage at the infection site  Severe back/neck pain  Any new symptoms which are concerning to you  Please note:  Although the local anesthetic injected can often make your back or neck feel good for several hours after the injection, the pain will likely return.  It takes 3-7 days for steroids to work in the epidural space.  You may not notice any pain relief for at least that one week.  If effective, we will often do a series of three injections spaced 3-6 weeks apart to maximally decrease your pain.  After the initial series, we  generally will wait several months before considering a repeat injection of the same type.  If you have any questions, please call [phone] [place]  What are the risk, side effects and possible complications? Generally speaking, most procedures are safe.  However, with any procedure there are risks, side effects, and the possibility of complications.  The risks and complications are dependent upon the sites that are lesioned, or the type of nerve block to be performed.  The closer the procedure is to the spine, the more serious the risks are.  Great care is taken when placing the radio frequency needles, block needles or lesioning probes, but sometimes complications can occur. 1. Infection: Any time there is an injection through the skin, there is a risk of infection.  This is why sterile conditions are used for these blocks.  There are four possible types of infection. 1. Localized skin infection. 2. Central Nervous System Infection-This can be in the form of Meningitis, which can be deadly. 3. Epidural Infections-This can be in the form of  an epidural abscess, which can cause pressure inside of the spine, causing compression of the spinal cord with subsequent paralysis. This would require an emergency surgery to decompress, and there are no guarantees that the patient would recover from the paralysis. 4. Discitis-This is an infection of the intervertebral discs.  It occurs in about 1% of discography procedures.  It is difficult to treat and it may lead to surgery.        2. Pain: the needles have to go through skin and soft tissues, will cause soreness.       3. Damage to internal structures:  The nerves to be lesioned may be near blood vessels or    other nerves which can be potentially damaged.       4. Bleeding: Bleeding is more common if the patient is taking blood thinners such as  aspirin, Coumadin, Ticiid, Plavix,  etc., or if he/she have some genetic predisposition  such as hemophilia. Bleeding into the spinal canal can cause compression of the spinal  cord with subsequent paralysis.  This would require an emergency surgery to  decompress and there are no guarantees that the patient would recover from the  paralysis.       5. Pneumothorax:  Puncturing of a lung is a possibility, every time a needle is introduced in  the area of the chest or upper back.  Pneumothorax refers to free air around the  collapsed lung(s), inside of the thoracic cavity (chest cavity).  Another two possible  complications related to a similar event would include: Hemothorax and Chylothorax.   These are variations of the Pneumothorax, where instead of air around the collapsed  lung(s), you may have blood or chyle, respectively.       6. Spinal headaches: They may occur with any procedures in the area of the spine.       7. Persistent CSF (Cerebro-Spinal Fluid) leakage: This is a rare problem, but may occur  with prolonged intrathecal or epidural catheters either due to the formation of a fistulous  track or a dural tear.       8. Nerve damage: By working so close to the spinal cord, there is always a possibility of  nerve damage, which could be as serious as a permanent spinal cord injury with  paralysis.       9. Death:  Although rare, severe deadly allergic reactions known as "Anaphylactic  reaction" can occur to any of the medications used.      10. Worsening of the symptoms:  We can always make thing worse.  What are the chances of something like this happening? Chances of any of this occuring are extremely low.  By statistics, you have more of a chance of getting killed in a motor vehicle accident: while driving to the hospital than any of the above occurring .  Nevertheless, you should be aware that they are possibilities.  In general, it is similar to taking a shower.  Everybody knows that you can slip, hit your head and get killed.   Does that mean that you should not shower again?  Nevertheless always keep in mind that statistics do not mean anything if you happen to be on the wrong side of them.  Even if a procedure has a 1 (one) in a 1,000,000 (million) chance of going wrong, it you happen to be that one..Also, keep in mind that by statistics, you have more of a chance of having something go wrong when taking  medications.  Who should not have this procedure? If you are on a blood thinning medication (e.g. Coumadin, Plavix, see list of "Blood Thinners"), or if you have an active infection going on, you should not have the procedure.  If you are taking any blood thinners, please inform your physician.  How should I prepare for this procedure?  Do not eat or drink anything at least six hours prior to the procedure.  Bring a driver with you .  It cannot be a taxi.  Come accompanied by an adult that can drive you back, and that is strong enough to help you if your legs get weak or numb from the local anesthetic.  Take all of your medicines the morning of the procedure with just enough water to swallow them.  If you have diabetes, make sure that you are scheduled to have your procedure done first thing in the morning, whenever possible.  If you have diabetes, take only half of your insulin dose and notify our nurse that you have done so as soon as you arrive at the clinic.  If you are diabetic, but only take blood sugar pills (oral hypoglycemic), then do not take them on the morning of your procedure.  You may take them after you have had the procedure.  Do not take aspirin or any aspirin-containing medications, at least eleven (11) days prior to the procedure.  They may prolong bleeding.  Wear loose fitting clothing that may be easy to take off and that you would not mind if it got stained with Betadine or blood.  Do not wear any jewelry or perfume  Remove any nail coloring.  It will interfere with some of our monitoring  equipment.  NOTE: Remember that this is not meant to be interpreted as a complete list of all possible complications.  Unforeseen problems may occur.  BLOOD THINNERS The following drugs contain aspirin or other products, which can cause increased bleeding during surgery and should not be taken for 2 weeks prior to and 1 week after surgery.  If you should need take something for relief of minor pain, you may take acetaminophen which is found in Tylenol,m Datril, Anacin-3 and Panadol. It is not blood thinner. The products listed below are.  Do not take any of the products listed below in addition to any listed on your instruction sheet.  A.P.C or A.P.C with Codeine Codeine Phosphate Capsules #3 Ibuprofen Ridaura  ABC compound Congesprin Imuran rimadil  Advil Cope Indocin Robaxisal  Alka-Seltzer Effervescent Pain Reliever and Antacid Coricidin or Coricidin-D  Indomethacin Rufen  Alka-Seltzer plus Cold Medicine Cosprin Ketoprofen S-A-C Tablets  Anacin Analgesic Tablets or Capsules Coumadin Korlgesic Salflex  Anacin Extra Strength Analgesic tablets or capsules CP-2 Tablets Lanoril Salicylate  Anaprox Cuprimine Capsules Levenox Salocol  Anexsia-D Dalteparin Magan Salsalate  Anodynos Darvon compound Magnesium Salicylate Sine-off  Ansaid Dasin Capsules Magsal Sodium Salicylate  Anturane Depen Capsules Marnal Soma  APF Arthritis pain formula Dewitt's Pills Measurin Stanback  Argesic Dia-Gesic Meclofenamic Sulfinpyrazone  Arthritis Bayer Timed Release Aspirin Diclofenac Meclomen Sulindac  Arthritis pain formula Anacin Dicumarol Medipren Supac  Analgesic (Safety coated) Arthralgen Diffunasal Mefanamic Suprofen  Arthritis Strength Bufferin Dihydrocodeine Mepro Compound Suprol  Arthropan liquid Dopirydamole Methcarbomol with Aspirin Synalgos  ASA tablets/Enseals Disalcid Micrainin Tagament  Ascriptin Doan's Midol Talwin  Ascriptin A/D Dolene Mobidin Tanderil  Ascriptin Extra Strength Dolobid  Moblgesic Ticlid  Ascriptin with Codeine Doloprin or Doloprin with Codeine Momentum Tolectin  Asperbuf Duoprin Mono-gesic Nurse, mental health  Aspergum Duradyne Motrin or Motrin IB Triminicin  Aspirin plain, buffered or enteric coated Durasal Myochrisine Trigesic  Aspirin Suppositories Easprin Nalfon Trillsate  Aspirin with Codeine Ecotrin Regular or Extra Strength Naprosyn Uracel  Atromid-S Efficin Naproxen Ursinus  Auranofin Capsules Elmiron Neocylate Vanquish  Axotal Emagrin Norgesic Verin  Azathioprine Empirin or Empirin with Codeine Normiflo Vitamin E  Azolid Emprazil Nuprin Voltaren  Bayer Aspirin plain, buffered or children's or timed BC Tablets or powders Encaprin Orgaran Warfarin Sodium  Buff-a-Comp Enoxaparin Orudis Zorpin  Buff-a-Comp with Codeine Equegesic Os-Cal-Gesic   Buffaprin Excedrin plain, buffered or Extra Strength Oxalid   Bufferin Arthritis Strength Feldene Oxphenbutazone   Bufferin plain or Extra Strength Feldene Capsules Oxycodone with Aspirin   Bufferin with Codeine Fenoprofen Fenoprofen Pabalate or Pabalate-SF   Buffets II Flogesic Panagesic   Buffinol plain or Extra Strength Florinal or Florinal with Codeine Panwarfarin   Buf-Tabs Flurbiprofen Penicillamine   Butalbital Compound Four-way cold tablets Penicillin   Butazolidin Fragmin Pepto-Bismol   Carbenicillin Geminisyn Percodan   Carna Arthritis Reliever Geopen Persantine   Carprofen Gold's salt Persistin   Chloramphenicol Goody's Phenylbutazone   Chloromycetin Haltrain Piroxlcam   Clmetidine heparin Plaquenil   Cllnoril Hyco-pap Ponstel   Clofibrate Hydroxy chloroquine Propoxyphen         Before stopping any of these medications, be sure to consult the physician who ordered them.  Some, such as Coumadin (Warfarin) are ordered to prevent or treat serious conditions such as "deep thrombosis", "pumonary embolisms", and other heart problems.  The amount of time that you may need off of the medication may also vary with  the medication and the reason for which you were taking it.  If you are taking any of these medications, please make sure you notify your pain physician before you undergo any procedures.

## 2017-03-09 NOTE — Progress Notes (Signed)
Safety precautions to be maintained throughout the outpatient stay will include: orient to surroundings, keep bed in low position, maintain call bell within reach at all times, provide assistance with transfer out of bed and ambulation.  

## 2017-03-10 ENCOUNTER — Encounter: Payer: Self-pay | Admitting: Physician Assistant

## 2017-03-10 ENCOUNTER — Emergency Department
Admission: EM | Admit: 2017-03-10 | Discharge: 2017-03-11 | Disposition: A | Discharge status: Home / Self Care | Payer: Medicare HMO | Attending: Emergency Medicine | Admitting: Emergency Medicine

## 2017-03-10 DIAGNOSIS — M79604 Pain in right leg: Secondary | ICD-10-CM | POA: Diagnosis present

## 2017-03-10 DIAGNOSIS — Z79899 Other long term (current) drug therapy: Secondary | ICD-10-CM | POA: Insufficient documentation

## 2017-03-10 DIAGNOSIS — M5126 Other intervertebral disc displacement, lumbar region: Secondary | ICD-10-CM

## 2017-03-10 DIAGNOSIS — F17210 Nicotine dependence, cigarettes, uncomplicated: Secondary | ICD-10-CM | POA: Diagnosis not present

## 2017-03-10 DIAGNOSIS — M5116 Intervertebral disc disorders with radiculopathy, lumbar region: Secondary | ICD-10-CM | POA: Diagnosis not present

## 2017-03-10 DIAGNOSIS — M5417 Radiculopathy, lumbosacral region: Secondary | ICD-10-CM | POA: Diagnosis not present

## 2017-03-10 DIAGNOSIS — I10 Essential (primary) hypertension: Secondary | ICD-10-CM | POA: Diagnosis not present

## 2017-03-10 MED ORDER — HYDROMORPHONE HCL 1 MG/ML IJ SOLN
1.0000 mg | Freq: Once | INTRAMUSCULAR | Status: AC
  Administered 2017-03-10: 1 mg via INTRAMUSCULAR
  Filled 2017-03-10: qty 1

## 2017-03-10 MED ORDER — GABAPENTIN 300 MG PO CAPS
300.0000 mg | ORAL_CAPSULE | Freq: Once | ORAL | Status: DC
  Filled 2017-03-10: qty 1

## 2017-03-10 NOTE — ED Notes (Signed)
Difficult ambulation observed by this rn

## 2017-03-10 NOTE — ED Triage Notes (Signed)
Pt in with co chronic right hip pain states has had xray which were negative. Went to chiropractor without relief, had MRI done and was told she had some bulging discs. Went to orthopoedist and pain clinic and is scheduled to get injections done. Pt is on prednisone and percocet so is here for persistent hip and leg pain.

## 2017-03-10 NOTE — ED Provider Notes (Signed)
[facility] Emergency Department Provider Note ____________________________________________  Time seen: 2242  I have reviewed the triage vital signs and the nursing notes.  HISTORY  Chief Complaint  Back Pain  HPI Victoria Gutierrez is a 71 y.o. female presents to the ED for evaluation of persistent RLE pain. 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 She was scheduled for an [facility] yesterday with Dr. Holley Raring, but it was postponed due to her not having been told to discontinue her Plavix. She is currently taking oxycodone 5-325 mg TID-QID, tizanidine BID, and is finishing a medrol dose pack. She denies any recent injury, foot drop, or falls. She is here for intense RLE pain without relief with her current meds.   Past Medical History:  Diagnosis Date  . Hypertension     Patient Active Problem List   Diagnosis Date Noted  . Localized osteoarthritis of hands, bilateral 09/23/2016  . Multiple joint pain 08/25/2016  . Psoriasis, unspecified 08/25/2016  . Cerebral vascular disease 04/16/2014  . Hyperlipidemia 04/16/2014  . Hypertension 04/16/2014  . 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 ABDOMINAL HYSTERECTOMY      Prior to Admission medications   Medication Sig Start Date End Date Taking? Authorizing Provider  citalopram (CELEXA) 20 MG tablet TAKE 1 TABLET EVERY NIGHT 11/11/16   [provider]  clopidogrel (PLAVIX) 75 MG tablet TAKE 1 TABLET EVERY DAY 11/11/16   [provider]  lisinopril (PRINIVIL,ZESTRIL) 20 MG tablet TAKE 1 TABLET EVERY DAY 11/11/16   [provider]  Melatonin 5 MG TBDP  05/12/16   [provider]  meloxicam (MOBIC) 7.5 MG tablet TAKE 1 TABLET ONCE DAILY WITH FOOD AS NEEDED FOR JOINT PAIN  10/06/16   [provider]  metoprolol tartrate (LOPRESSOR) 50 MG tablet TAKE 1 TABLET TWICE DAILY 11/11/16   [provider]  ondansetron (ZOFRAN) 4 MG tablet Take 1 tablet (4 mg total) by mouth every 6 (six) hours as needed for nausea or vomiting. 12/30/14   Carrie Mew, MD  oxyCODONE (ROXICODONE) 5 MG immediate release tablet Take 1 tablet (5 mg total) by mouth every 8 (eight) hours as needed. 03/10/17 03/16/17  Jozalyn Baglio, Dannielle Karvonen, PA-C  oxyCODONE-acetaminophen (PERCOCET/ROXICET) 5-325 MG tablet Take 1 tablet by mouth every 6 (six) hours as needed for severe pain.    [provider]  pregabalin (LYRICA) 75 MG capsule Take 1 BID x 3 days; then increase to 2 tab BID 03/10/17   Nilo Fallin, Dannielle Karvonen, PA-C    Allergies Patient has no known allergies.  Family History  Problem Relation Age of Onset  . Heart disease Mother   . Heart disease Father     Social History Social History  Substance Use Topics  . Smoking status: Current Every Day Smoker    Packs/day: 0.50    Years: 30.00    Types: Cigarettes    Start date: 03/10/1987  . Smokeless tobacco: Never Used  . Alcohol use No    Review of Systems  Constitutional: Negative for fever. Gastrointestinal: Negative for abdominal pain, vomiting and diarrhea. Genitourinary: Negative for dysuria. Musculoskeletal: Positive for back pain with RLE radicular symptoms. Skin: Negative for rash. Neurological: Negative for headaches, focal weakness or numbness. ____________________________________________  PHYSICAL EXAM:  VITAL SIGNS: ED Triage Vitals  Enc Vitals Group     BP 03/10/17 2211 137/88     Pulse Rate 03/10/17  2211 86     Resp 03/10/17 2211 20     Temp 03/10/17 2211 98.5 F (36.9 C)     Temp Source 03/10/17 2211 Oral     SpO2 03/10/17 2211 96 %     Weight 03/10/17 2210 175 lb (79.4 kg)     Height 03/10/17 2210 5\' 6"  (1.676 m)     Head Circumference --      Peak Flow --      Pain Score 03/10/17 2210 10     Pain Loc --      Pain Edu? --      Excl. in [place]? --     Constitutional: Alert and oriented. Well appearing  and in no distress. Head: Normocephalic and atraumatic. Cardiovascular: Normal rate, regular rhythm. Normal distal pulses. Respiratory: Normal respiratory effort. No wheezes/rales/rhonchi. Gastrointestinal: Soft and nontender. No distention. Musculoskeletal: Nontender with normal range of motion in all extremities.  Neurologic: CN II-XII grossly intact. Equivocal LE DTRs bilaterally. Antalgic gait without ataxia. Normal speech and language. No gross focal neurologic deficits are appreciated. Skin:  Skin is warm, dry and intact. No rash noted. ____________________________________________   RADIOLOGY  MRI Lumbar Spine (02/17/2017)  IMPRESSION: 1. Multilevel degenerative changes throughout the lumbar spine, as described above, with disc bulging and superimposed right foraminal disc extrusion at L3-L4 resulting in severe right neuroforaminal stenosis and impingement on the exiting L3 nerve root. 2. Mild central spinal canal stenosis at L2-L3 and L3-L4. 3. Marrow heterogeneity is present. Although this can be caused by marrow infiltrative processes, the most common causes include anemia, smoking, obesity, or advancing age. ____________________________________________  LABORATORY  Labs Reviewed  URINALYSIS, COMPLETE (UACMP) WITH MICROSCOPIC - Abnormal; Notable for the following:       Result Value   Color, Urine YELLOW (*)    APPearance HAZY (*)    Glucose, UA 50 (*)    Hgb urine dipstick MODERATE (*)    Bacteria, UA RARE (*)    Squamous Epithelial / LPF 0-5 (*)    All other components within normal limits   PROCEDURES  Dilaudid 1 mg IM ____________________________________________  INITIAL IMPRESSION / ASSESSMENT AND PLAN / ED COURSE  Patient with MRI confirmed right L3-4 HNP with L3 nerve impingement.  Her MRI results are reviewed and the patient is found to have an acute on chronic pain presentation. The majority of the exam time was spent discussing the possible treatment  options and need to reschedule the ESI. She should follow-up with Dr. Gilford Rile, or a neurologist for ongoing management. Return precautions are reviewed. The patient and her sister thanked me for my time and consideration.  ____________________________________________  FINAL CLINICAL IMPRESSION(S) / ED DIAGNOSES  Final diagnoses:  HNP (herniated nucleus pulposus), lumbar  Lumbosacral radiculopathy at L3      Akoni Parton, Dannielle Karvonen, PA-C 03/11/17 0076    Hinda Kehr, MD 03/12/17 0330

## 2017-03-11 LAB — URINALYSIS, COMPLETE (UACMP) WITH MICROSCOPIC
Bilirubin Urine: NEGATIVE
Glucose, UA: 50 mg/dL — AB
Ketones, ur: NEGATIVE mg/dL
Leukocytes, UA: NEGATIVE
Nitrite: NEGATIVE
PH: 5 (ref 5.0–8.0)
Protein, ur: NEGATIVE mg/dL
Specific Gravity, Urine: 1.018 (ref 1.005–1.030)

## 2017-03-11 MED ORDER — OXYCODONE HCL 5 MG PO TABS: 5.0000 mg | ORAL_TABLET | Freq: Three times a day (TID) | ORAL | 0 refills | Status: AC | PRN

## 2017-03-11 MED ORDER — PREGABALIN 75 MG PO CAPS: ORAL_CAPSULE | ORAL | 1 refills | Status: DC

## 2017-03-11 NOTE — Discharge Instructions (Signed)
Take the prescription meds as directed. Follow-up with Dr. Holley Raring or a neurologist for ongoing management. Return to the ED as needed.

## 2017-03-16 ENCOUNTER — Telehealth: Payer: Self-pay | Admitting: *Deleted

## 2017-03-16 NOTE — Telephone Encounter (Signed)
Spoke with patients daughter and she states that Dr Derry Skill office called patient and told her it was OK for her to stop her Plavix for 7 days.  Informed daughter that we would need it in writing.  Will send document for approval to stop Plavix.

## 2017-03-22 DIAGNOSIS — Z8673 Personal history of transient ischemic attack (TIA), and cerebral infarction without residual deficits: Secondary | ICD-10-CM | POA: Diagnosis not present

## 2017-04-01 DIAGNOSIS — R4781 Slurred speech: Secondary | ICD-10-CM | POA: Diagnosis not present

## 2017-04-01 DIAGNOSIS — R55 Syncope and collapse: Secondary | ICD-10-CM | POA: Diagnosis not present

## 2017-04-04 ENCOUNTER — Other Ambulatory Visit: Payer: Self-pay | Admitting: Neurology

## 2017-04-04 DIAGNOSIS — R55 Syncope and collapse: Secondary | ICD-10-CM

## 2017-04-07 DIAGNOSIS — I10 Essential (primary) hypertension: Secondary | ICD-10-CM | POA: Diagnosis not present

## 2017-04-07 DIAGNOSIS — R55 Syncope and collapse: Secondary | ICD-10-CM | POA: Diagnosis not present

## 2017-04-07 DIAGNOSIS — S8012XA Contusion of left lower leg, initial encounter: Secondary | ICD-10-CM | POA: Diagnosis not present

## 2017-04-07 DIAGNOSIS — Z0000 Encounter for general adult medical examination without abnormal findings: Secondary | ICD-10-CM | POA: Diagnosis not present

## 2017-04-08 ENCOUNTER — Ambulatory Visit
Admission: RE | Admit: 2017-04-08 | Discharge: 2017-04-08 | Disposition: A | Discharge status: Home / Self Care | Payer: Medicare HMO | Source: Ambulatory Visit | Attending: Neurology | Admitting: Neurology

## 2017-04-08 DIAGNOSIS — G319 Degenerative disease of nervous system, unspecified: Secondary | ICD-10-CM | POA: Insufficient documentation

## 2017-04-08 DIAGNOSIS — R55 Syncope and collapse: Secondary | ICD-10-CM

## 2017-04-08 DIAGNOSIS — Z8673 Personal history of transient ischemic attack (TIA), and cerebral infarction without residual deficits: Secondary | ICD-10-CM | POA: Insufficient documentation

## 2017-04-08 DIAGNOSIS — R9082 White matter disease, unspecified: Secondary | ICD-10-CM | POA: Diagnosis not present

## 2017-04-08 DIAGNOSIS — R4781 Slurred speech: Secondary | ICD-10-CM | POA: Diagnosis not present

## 2017-04-21 ENCOUNTER — Telehealth: Payer: Self-pay

## 2017-04-21 ENCOUNTER — Ambulatory Visit: Payer: Self-pay | Admitting: Neurology

## 2017-04-21 NOTE — Telephone Encounter (Signed)
PT saw neurologist in [place] on 04/01/2017.

## 2017-04-24 DIAGNOSIS — R55 Syncope and collapse: Secondary | ICD-10-CM | POA: Diagnosis not present

## 2017-04-24 DIAGNOSIS — R4781 Slurred speech: Secondary | ICD-10-CM | POA: Diagnosis not present

## 2017-04-27 DIAGNOSIS — R9082 White matter disease, unspecified: Secondary | ICD-10-CM | POA: Diagnosis not present

## 2017-04-27 DIAGNOSIS — Z8673 Personal history of transient ischemic attack (TIA), and cerebral infarction without residual deficits: Secondary | ICD-10-CM | POA: Diagnosis not present

## 2017-04-27 DIAGNOSIS — R55 Syncope and collapse: Secondary | ICD-10-CM | POA: Diagnosis not present

## 2017-04-27 DIAGNOSIS — R4781 Slurred speech: Secondary | ICD-10-CM | POA: Diagnosis not present

## 2017-05-11 DIAGNOSIS — M545 Low back pain: Secondary | ICD-10-CM | POA: Diagnosis not present

## 2017-05-11 DIAGNOSIS — H9193 Unspecified hearing loss, bilateral: Secondary | ICD-10-CM | POA: Diagnosis not present

## 2017-05-11 DIAGNOSIS — J339 Nasal polyp, unspecified: Secondary | ICD-10-CM | POA: Diagnosis not present

## 2017-05-11 DIAGNOSIS — I10 Essential (primary) hypertension: Secondary | ICD-10-CM | POA: Diagnosis not present

## 2017-05-11 DIAGNOSIS — E785 Hyperlipidemia, unspecified: Secondary | ICD-10-CM | POA: Diagnosis not present

## 2017-05-11 DIAGNOSIS — I679 Cerebrovascular disease, unspecified: Secondary | ICD-10-CM | POA: Diagnosis not present

## 2017-05-12 ENCOUNTER — Telehealth: Payer: Self-pay | Admitting: Student in an Organized Health Care Education/Training Program

## 2017-05-12 NOTE — Telephone Encounter (Signed)
Dr Manuella Ghazi  Has given permission for patient to stop plavix. Michela Pitcher it would be in her chart she has [employer] so will need prior [place]. Patient wants nurse to call about plavix

## 2017-05-12 NOTE — Telephone Encounter (Signed)
Attempted to call, no answer, no voicemail

## 2017-05-16 NOTE — Telephone Encounter (Signed)
Attempted to reach patient, no answer and no voice mail.

## 2017-05-25 ENCOUNTER — Ambulatory Visit
Admission: RE | Admit: 2017-05-25 | Discharge: 2017-05-25 | Disposition: A | Discharge status: Home / Self Care | Payer: Medicare HMO | Source: Ambulatory Visit | Attending: Student in an Organized Health Care Education/Training Program | Admitting: Student in an Organized Health Care Education/Training Program

## 2017-05-25 ENCOUNTER — Encounter: Payer: Self-pay | Admitting: Student in an Organized Health Care Education/Training Program

## 2017-05-25 ENCOUNTER — Ambulatory Visit (HOSPITAL_BASED_OUTPATIENT_CLINIC_OR_DEPARTMENT_OTHER): Payer: Medicare HMO | Admitting: Student in an Organized Health Care Education/Training Program

## 2017-05-25 ENCOUNTER — Other Ambulatory Visit: Payer: Self-pay

## 2017-05-25 DIAGNOSIS — Z7902 Long term (current) use of antithrombotics/antiplatelets: Secondary | ICD-10-CM | POA: Diagnosis not present

## 2017-05-25 DIAGNOSIS — M5416 Radiculopathy, lumbar region: Secondary | ICD-10-CM

## 2017-05-25 DIAGNOSIS — M545 Low back pain: Secondary | ICD-10-CM | POA: Diagnosis not present

## 2017-05-25 DIAGNOSIS — M79604 Pain in right leg: Secondary | ICD-10-CM | POA: Diagnosis not present

## 2017-05-25 MED ORDER — LACTATED RINGERS IV SOLN
1000.0000 mL | Freq: Once | INTRAVENOUS | Status: AC
  Administered 2017-05-25: 1000 mL via INTRAVENOUS

## 2017-05-25 MED ORDER — DEXAMETHASONE SODIUM PHOSPHATE 10 MG/ML IJ SOLN
INTRAMUSCULAR | Status: AC
  Filled 2017-05-25: qty 1

## 2017-05-25 MED ORDER — SODIUM CHLORIDE 0.9 % IJ SOLN
INTRAMUSCULAR | Status: AC
  Filled 2017-05-25: qty 10

## 2017-05-25 MED ORDER — SODIUM CHLORIDE 0.9% FLUSH
2.0000 mL | Freq: Once | INTRAVENOUS | Status: AC
  Administered 2017-05-25: 10 mL

## 2017-05-25 MED ORDER — FENTANYL CITRATE (PF) 100 MCG/2ML IJ SOLN
25.0000 ug | INTRAMUSCULAR | Status: DC | PRN
  Administered 2017-05-25: 50 ug via INTRAVENOUS

## 2017-05-25 MED ORDER — DEXAMETHASONE SODIUM PHOSPHATE 10 MG/ML IJ SOLN
10.0000 mg | Freq: Once | INTRAMUSCULAR | Status: AC
  Administered 2017-05-25: 10 mg

## 2017-05-25 MED ORDER — ROPIVACAINE HCL 2 MG/ML IJ SOLN
INTRAMUSCULAR | Status: AC
  Filled 2017-05-25: qty 10

## 2017-05-25 MED ORDER — LIDOCAINE HCL (PF) 1 % IJ SOLN
4.5000 mL | Freq: Once | INTRAMUSCULAR | Status: AC
  Administered 2017-05-25: 5 mL

## 2017-05-25 MED ORDER — IOPAMIDOL (ISOVUE-M 200) INJECTION 41%
10.0000 mL | Freq: Once | INTRAMUSCULAR | Status: AC
  Administered 2017-05-25: 10 mL via EPIDURAL
  Filled 2017-05-25: qty 10

## 2017-05-25 MED ORDER — FENTANYL CITRATE (PF) 100 MCG/2ML IJ SOLN
INTRAMUSCULAR | Status: AC
  Filled 2017-05-25: qty 2

## 2017-05-25 MED ORDER — ROPIVACAINE HCL 2 MG/ML IJ SOLN
2.0000 mL | Freq: Once | INTRAMUSCULAR | Status: AC
  Administered 2017-05-25: 10 mL via EPIDURAL

## 2017-05-25 MED ORDER — LIDOCAINE HCL (PF) 1 % IJ SOLN
INTRAMUSCULAR | Status: AC
  Filled 2017-05-25: qty 5

## 2017-05-25 NOTE — Progress Notes (Signed)
Patient's Name: Victoria Gutierrez  MRN: [id number]  Referring Provider: Gillis Santa, MD  DOB: 11-07-1945  PCP: Madelyn Brunner, MD  DOS: 05/25/2017  Note by: Gillis Santa, MD  Service setting: Ambulatory outpatient  Specialty: Interventional Pain Management  Patient type: Established  Location: [facility] (AMB) Pain Management Facility  Visit type: Interventional Procedure   Primary Reason for Visit: Interventional Pain Management Treatment. CC: Back Pain (lower) and Leg Pain (right)  Procedure:  Anesthesia, Analgesia, Anxiolysis:  Type: Therapeutic Inter-Laminar Epidural Steroid Injection Region: Lumbar Level: L4-5 Level. Laterality: Right-Sided         Type: Local Anesthesia with Moderate (Conscious) Sedation Local Anesthetic: Lidocaine 1% Route: Intravenous (IV) IV Access: Secured Sedation: Meaningful verbal contact was maintained at all times during the procedure  Indication(s): Analgesia and Anxiety   Indications: 1. Lumbar radiculopathy    Pain Score: Pre-procedure: 4 /10 Post-procedure: 0-No pain/10  Pre-op Assessment:  Victoria Gutierrez is a 71 y.o. (year old), female patient, seen today for interventional treatment. She  has a past surgical history that includes Abdominal hysterectomy. Victoria Gutierrez has a current medication list which includes the following prescription(s): citalopram, clopidogrel, lisinopril, melatonin, pregabalin, hydrochlorothiazide, ibuprofen, meloxicam, potassium, and tramadol, and the following Facility-Administered Medications: fentanyl. Her primarily concern today is the Back Pain (lower) and Leg Pain (right)   Patient has received clearance from neurologist to stop Plavix for lumbar epidural steroid injection.  Patient's last dose of Plavix was 05/17/2017.   Initial Vital Signs: Blood pressure (!) 136/92, pulse 98, temperature 98.3 F (36.8 C), temperature source Oral, resp. rate 16, height 5\' 6"  (1.676 m), weight 173 lb (78.5 kg), SpO2 98 %. BMI: Estimated body  mass index is 27.92 kg/m as calculated from the following:   Height as of this encounter: 5\' 6"  (1.676 m).   Weight as of this encounter: 173 lb (78.5 kg).  Risk Assessment: Allergies: Reviewed. She has No Known Allergies.  Allergy Precautions: None required Coagulopathies: Reviewed. None identified.  Blood-thinner therapy: None at this time Active Infection(s): Reviewed. None identified. [name] is afebrile  Site Confirmation: Victoria Gutierrez was asked to confirm the procedure and laterality before marking the site Procedure checklist: Completed Consent: Before the procedure and under the influence of no sedative(s), amnesic(s), or anxiolytics, the patient was informed of the treatment options, risks and possible complications. To fulfill our ethical and legal obligations, as recommended by the American Medical Association's Code of Ethics, I have informed the patient of my clinical impression; the nature and purpose of the treatment or procedure; the risks, benefits, and possible complications of the intervention; the alternatives, including doing nothing; the risk(s) and benefit(s) of the alternative treatment(s) or procedure(s); and the risk(s) and benefit(s) of doing nothing. The patient was provided information about the general risks and possible complications associated with the procedure. These may include, but are not limited to: failure to achieve desired goals, infection, bleeding, organ or nerve damage, allergic reactions, paralysis, and death. In addition, the patient was informed of those risks and complications associated to Spine-related procedures, such as failure to decrease pain; infection (i.e.: Meningitis, epidural or intraspinal abscess); bleeding (i.e.: epidural hematoma, subarachnoid hemorrhage, or any other type of intraspinal or peri-dural bleeding); organ or nerve damage (i.e.: Any type of peripheral nerve, nerve root, or spinal cord injury) with subsequent damage to sensory,  motor, and/or autonomic systems, resulting in permanent pain, numbness, and/or weakness of one or several areas of the body; allergic reactions; (i.e.: anaphylactic reaction);  and/or death. Furthermore, the patient was informed of those risks and complications associated with the medications. These include, but are not limited to: allergic reactions (i.e.: anaphylactic or anaphylactoid reaction(s)); adrenal axis suppression; blood sugar elevation that in diabetics may result in ketoacidosis or comma; water retention that in patients with history of congestive heart failure may result in shortness of breath, pulmonary edema, and decompensation with resultant heart failure; weight gain; swelling or edema; medication-induced neural toxicity; particulate matter embolism and blood vessel occlusion with resultant organ, and/or nervous system infarction; and/or aseptic necrosis of one or more joints. Finally, the patient was informed that Medicine is not an exact science; therefore, there is also the possibility of unforeseen or unpredictable risks and/or possible complications that may result in a catastrophic outcome. The patient indicated having understood very clearly. We have given the patient no guarantees and we have made no promises. Enough time was given to the patient to ask questions, all of which were answered to the patient's satisfaction. Victoria Gutierrez has indicated that she wanted to continue with the procedure. Attestation: I, the ordering provider, attest that I have discussed with the patient the benefits, risks, side-effects, alternatives, likelihood of achieving goals, and potential problems during recovery for the procedure that I have provided informed consent. Date: 05/25/2017; Time: 9:17 AM  Pre-Procedure Preparation:  Monitoring: As per clinic protocol. Respiration, ETCO2, SpO2, BP, heart rate and rhythm monitor placed and checked for adequate function Safety Precautions: Patient was assessed  for positional comfort and pressure points before starting the procedure. Time-out: I initiated and conducted the "Time-out" before starting the procedure, as per protocol. The patient was asked to participate by confirming the accuracy of the "Time Out" information. Verification of the correct person, site, and procedure were performed and confirmed by me, the nursing staff, and the patient. "Time-out" conducted as per Joint Commission's Universal Protocol (UP.01.01.01). "Time-out" Date & Time: 05/25/2017; 0941 hrs.  Description of Procedure Process:   Position: Prone with head of the table was raised to facilitate breathing. Target Area: The interlaminar space, initially targeting the lower laminar border of the superior vertebral body. Approach: Paramedial approach. Area Prepped: Entire Posterior Lumbar Region Prepping solution: ChloraPrep (2% chlorhexidine gluconate and 70% isopropyl alcohol) Safety Precautions: Aspiration looking for blood return was conducted prior to all injections. At no point did we inject any substances, as a needle was being advanced. No attempts were made at seeking any paresthesias. Safe injection practices and needle disposal techniques used. Medications properly checked for expiration dates. SDV (single dose vial) medications used. Description of the Procedure: Protocol guidelines were followed. The procedure needle was introduced through the skin, ipsilateral to the reported pain, and advanced to the target area. Bone was contacted and the needle walked caudad, until the lamina was cleared. The epidural space was identified using "loss-of-resistance technique" with 2-3 ml of PF-NaCl (0.9% NSS), in a 5cc LOR glass syringe. Vitals:   05/25/17 0952 05/25/17 1002 05/25/17 1012 05/25/17 1022  BP: 139/82 106/70 126/70 (!) 141/68  Pulse: 92 82 82 90  Resp: 17 16 14 17   Temp: 97.7 F (36.5 C)   98.3 F (36.8 C)  TempSrc:      SpO2: 98% 94% 97% 97%  Weight:      Height:         Start Time: 0942 hrs. End Time: 0952 hrs. Materials:  Needle(s) Type: Epidural needle Gauge: 17G Length: 3.5-in Medication(s): We administered lactated ringers, fentaNYL, iopamidol, ropivacaine (PF) 2 mg/mL (0.2%),  sodium chloride flush, dexamethasone, and lidocaine (PF). Please see chart orders for dosing details. 8 cc total: 5 cc preservative-free saline, 2 cc of 0.2% ropivacaine, 1 cc of Decadron 10 mg/cc. Imaging Guidance (Spinal):  Type of Imaging Technique: Fluoroscopy Guidance (Spinal) Indication(s): Assistance in needle guidance and placement for procedures requiring needle placement in or near specific anatomical locations not easily accessible without such assistance. Exposure Time: Please see nurses notes. Contrast: Before injecting any contrast, we confirmed that the patient did not have an allergy to iodine, shellfish, or radiological contrast. Once satisfactory needle placement was completed at the desired level, radiological contrast was injected. Contrast injected under live fluoroscopy. No contrast complications. See chart for type and volume of contrast used. Fluoroscopic Guidance: I was personally present during the use of fluoroscopy. "Tunnel Vision Technique" used to obtain the best possible view of the target area. Parallax error corrected before commencing the procedure. "Direction-depth-direction" technique used to introduce the needle under continuous pulsed fluoroscopy. Once target was reached, antero-posterior, oblique, and lateral fluoroscopic projection used confirm needle placement in all planes. Images permanently stored in EMR. Interpretation: I personally interpreted the imaging intraoperatively. Adequate needle placement confirmed in multiple planes. Appropriate spread of contrast into desired area was observed. No evidence of afferent or efferent intravascular uptake. No intrathecal or subarachnoid spread observed. Permanent images saved into the patient's  record.  Antibiotic Prophylaxis:  Indication(s): None identified Antibiotic given: None  Post-operative Assessment:  EBL: None Complications: No immediate post-treatment complications observed by team, or reported by patient. Note: The patient tolerated the entire procedure well. A repeat set of vitals were taken after the procedure and the patient was kept under observation following institutional policy, for this type of procedure. Post-procedural neurological assessment was performed, showing return to baseline, prior to discharge. The patient was provided with post-procedure discharge instructions, including a section on how to identify potential problems. Should any problems arise concerning this procedure, the patient was given instructions to immediately contact us, at any time, without hesitation. In any case, we plan to contact the patient by telephone for a follow-up status report regarding this interventional procedure. Comments:  No additional relevant information. 5 out of 5 strength bilateral lower extremity: Plantar flexion, dorsiflexion, knee flexion, knee extension.   Plan of Care   Imaging Orders     DG C-Arm 1-60 Min-No Report Procedure Orders    No procedure(s) ordered today   Follow-up in 2 weeks for postprocedural evaluation.  Patient instructed to restart Plavix tomorrow, 24 hours after procedure.  Medications ordered for procedure: Meds ordered this encounter  Medications  . lactated ringers infusion 1,000 mL  . fentaNYL (SUBLIMAZE) injection 25-100 mcg    Make sure Narcan is available in the pyxis when using this medication. In the event of respiratory depression (RR< 8/min): Titrate NARCAN (naloxone) in increments of 0.1 to 0.2 mg IV at 2-3 minute intervals, until desired degree of reversal.  . iopamidol (ISOVUE-M) 41 % intrathecal injection 10 mL  . ropivacaine (PF) 2 mg/mL (0.2%) (NAROPIN) injection 2 mL  . sodium chloride flush (NS) 0.9 % injection 2 mL  .  dexamethasone (DECADRON) injection 10 mg  . lidocaine (PF) (XYLOCAINE) 1 % injection 4.5 mL   Medications administered: We administered lactated ringers, fentaNYL, iopamidol, ropivacaine (PF) 2 mg/mL (0.2%), sodium chloride flush, dexamethasone, and lidocaine (PF).  See the medical record for exact dosing, route, and time of administration.  This SmartLink is deprecated. Use AVSMEDLIST instead to display the medication list  for a patient. Disposition: Discharge home  Discharge Date & Time: 05/25/2017; 1025 hrs.   Physician-requested Follow-up: Return in about 2 weeks (around 06/08/2017) for Post Procedure Evaluation. Future Appointments  Date Time Provider [place]  06/09/2017 11:30 AM Gillis Santa, MD [facility] None   Primary Care Physician: Madelyn Brunner, MD Location: [facility] Outpatient Pain Management Facility Note by: Gillis Santa, MD Date: 05/25/2017; Time: 11:25 AM  Disclaimer:  Medicine is not an exact science. The only guarantee in medicine is that nothing is guaranteed. It is important to note that the decision to proceed with this intervention was based on the information collected from the patient. The Data and conclusions were drawn from the patient's questionnaire, the interview, and the physical examination. Because the information was provided in large part by the patient, it cannot be guaranteed that it has not been purposely or unconsciously manipulated. Every effort has been made to obtain as much relevant data as possible for this evaluation. It is important to note that the conclusions that lead to this procedure are derived in large part from the available data. Always take into account that the treatment will also be dependent on availability of resources and existing treatment guidelines, considered by other Pain Management Practitioners as being common knowledge and practice, at the time of the intervention. For Medico-Legal purposes, it is also important to  point out that variation in procedural techniques and pharmacological choices are the acceptable norm. The indications, contraindications, technique, and results of the above procedure should only be interpreted and judged by a Board-Certified Interventional Pain Specialist with extensive familiarity and expertise in the same exact procedure and technique.

## 2017-05-25 NOTE — Progress Notes (Signed)
Safety precautions to be maintained throughout the outpatient stay will include: orient to surroundings, keep bed in low position, maintain call bell within reach at all times, provide assistance with transfer out of bed and ambulation.  

## 2017-05-25 NOTE — Patient Instructions (Addendum)
Restart your Plavix tomorrow afternoon.  GENERAL RISKS AND COMPLICATIONS  What are the risk, side effects and possible complications? Generally speaking, most procedures are safe.  However, with any procedure there are risks, side effects, and the possibility of complications.  The risks and complications are dependent upon the sites that are lesioned, or the type of nerve block to be performed.  The closer the procedure is to the spine, the more serious the risks are.  Great care is taken when placing the radio frequency needles, block needles or lesioning probes, but sometimes complications can occur. 1. Infection: Any time there is an injection through the skin, there is a risk of infection.  This is why sterile conditions are used for these blocks.  There are four possible types of infection. 1. Localized skin infection. 2. Central Nervous System Infection-This can be in the form of Meningitis, which can be deadly. 3. Epidural Infections-This can be in the form of an epidural abscess, which can cause pressure inside of the spine, causing compression of the spinal cord with subsequent paralysis. This would require an emergency surgery to decompress, and there are no guarantees that the patient would recover from the paralysis. 4. Discitis-This is an infection of the intervertebral discs.  It occurs in about 1% of discography procedures.  It is difficult to treat and it may lead to surgery.        2. Pain: the needles have to go through skin and soft tissues, will cause soreness.       3. Damage to internal structures:  The nerves to be lesioned may be near blood vessels or    other nerves which can be potentially damaged.       4. Bleeding: Bleeding is more common if the patient is taking blood thinners such as  aspirin, Coumadin, Ticiid, Plavix, etc., or if he/she have some genetic predisposition  such as hemophilia. Bleeding into the spinal canal can cause compression of the spinal  cord with  subsequent paralysis.  This would require an emergency surgery to  decompress and there are no guarantees that the patient would recover from the  paralysis.       5. Pneumothorax:  Puncturing of a lung is a possibility, every time a needle is introduced in  the area of the chest or upper back.  Pneumothorax refers to free air around the  collapsed lung(s), inside of the thoracic cavity (chest cavity).  Another two possible  complications related to a similar event would include: Hemothorax and Chylothorax.   These are variations of the Pneumothorax, where instead of air around the collapsed  lung(s), you may have blood or chyle, respectively.       6. Spinal headaches: They may occur with any procedures in the area of the spine.       7. Persistent CSF (Cerebro-Spinal Fluid) leakage: This is a rare problem, but may occur  with prolonged intrathecal or epidural catheters either due to the formation of a fistulous  track or a dural tear.       8. Nerve damage: By working so close to the spinal cord, there is always a possibility of  nerve damage, which could be as serious as a permanent spinal cord injury with  paralysis.       9. Death:  Although rare, severe deadly allergic reactions known as "Anaphylactic  reaction" can occur to any of the medications used.      10. Worsening of the symptoms:  We can always make thing worse.  What are the chances of something like this happening? Chances of any of this occuring are extremely low.  By statistics, you have more of a chance of getting killed in a motor vehicle accident: while driving to the hospital than any of the above occurring .  Nevertheless, you should be aware that they are possibilities.  In general, it is similar to taking a shower.  Everybody knows that you can slip, hit your head and get killed.  Does that mean that you should not shower again?  Nevertheless always keep in mind that statistics do not mean anything if you happen to be on the wrong  side of them.  Even if a procedure has a 1 (one) in a 1,000,000 (million) chance of going wrong, it you happen to be that one..Also, keep in mind that by statistics, you have more of a chance of having something go wrong when taking medications.  Who should not have this procedure? If you are on a blood thinning medication (e.g. Coumadin, Plavix, see list of "Blood Thinners"), or if you have an active infection going on, you should not have the procedure.  If you are taking any blood thinners, please inform your physician.  How should I prepare for this procedure?  Do not eat or drink anything at least six hours prior to the procedure.  Bring a driver with you .  It cannot be a taxi.  Come accompanied by an adult that can drive you back, and that is strong enough to help you if your legs get weak or numb from the local anesthetic.  Take all of your medicines the morning of the procedure with just enough water to swallow them.  If you have diabetes, make sure that you are scheduled to have your procedure done first thing in the morning, whenever possible.  If you have diabetes, take only half of your insulin dose and notify our nurse that you have done so as soon as you arrive at the clinic.  If you are diabetic, but only take blood sugar pills (oral hypoglycemic), then do not take them on the morning of your procedure.  You may take them after you have had the procedure.  Do not take aspirin or any aspirin-containing medications, at least eleven (11) days prior to the procedure.  They may prolong bleeding.  Wear loose fitting clothing that may be easy to take off and that you would not mind if it got stained with Betadine or blood.  Do not wear any jewelry or perfume  Remove any nail coloring.  It will interfere with some of our monitoring equipment.  NOTE: Remember that this is not meant to be interpreted as a complete list of all possible complications.  Unforeseen problems may  occur.  BLOOD THINNERS The following drugs contain aspirin or other products, which can cause increased bleeding during surgery and should not be taken for 2 weeks prior to and 1 week after surgery.  If you should need take something for relief of minor pain, you may take acetaminophen which is found in Tylenol,m Datril, Anacin-3 and Panadol. It is not blood thinner. The products listed below are.  Do not take any of the products listed below in addition to any listed on your instruction sheet.  A.P.C or A.P.C with Codeine Codeine Phosphate Capsules #3 Ibuprofen Ridaura  ABC compound Congesprin Imuran rimadil  Advil Cope Indocin Robaxisal  Alka-Seltzer Effervescent Pain Reliever and Antacid  Coricidin or Coricidin-D  Indomethacin Rufen  Alka-Seltzer plus Cold Medicine Cosprin Ketoprofen S-A-C Tablets  Anacin Analgesic Tablets or Capsules Coumadin Korlgesic Salflex  Anacin Extra Strength Analgesic tablets or capsules CP-2 Tablets Lanoril Salicylate  Anaprox Cuprimine Capsules Levenox Salocol  Anexsia-D Dalteparin Magan Salsalate  Anodynos Darvon compound Magnesium Salicylate Sine-off  Ansaid Dasin Capsules Magsal Sodium Salicylate  Anturane Depen Capsules Marnal Soma  APF Arthritis pain formula Dewitt's Pills Measurin Stanback  Argesic Dia-Gesic Meclofenamic Sulfinpyrazone  Arthritis Bayer Timed Release Aspirin Diclofenac Meclomen Sulindac  Arthritis pain formula Anacin Dicumarol Medipren Supac  Analgesic (Safety coated) Arthralgen Diffunasal Mefanamic Suprofen  Arthritis Strength Bufferin Dihydrocodeine Mepro Compound Suprol  Arthropan liquid Dopirydamole Methcarbomol with Aspirin Synalgos  ASA tablets/Enseals Disalcid Micrainin Tagament  Ascriptin Doan's Midol Talwin  Ascriptin A/D Dolene Mobidin Tanderil  Ascriptin Extra Strength Dolobid Moblgesic Ticlid  Ascriptin with Codeine Doloprin or Doloprin with Codeine Momentum Tolectin  Asperbuf Duoprin Mono-gesic Trendar  Aspergum Duradyne  Motrin or Motrin IB Triminicin  Aspirin plain, buffered or enteric coated Durasal Myochrisine Trigesic  Aspirin Suppositories Easprin Nalfon Trillsate  Aspirin with Codeine Ecotrin Regular or Extra Strength Naprosyn Uracel  Atromid-S Efficin Naproxen Ursinus  Auranofin Capsules Elmiron Neocylate Vanquish  Axotal Emagrin Norgesic Verin  Azathioprine Empirin or Empirin with Codeine Normiflo Vitamin E  Azolid Emprazil Nuprin Voltaren  Bayer Aspirin plain, buffered or children's or timed BC Tablets or powders Encaprin Orgaran Warfarin Sodium  Buff-a-Comp Enoxaparin Orudis Zorpin  Buff-a-Comp with Codeine Equegesic Os-Cal-Gesic   Buffaprin Excedrin plain, buffered or Extra Strength Oxalid   Bufferin Arthritis Strength Feldene Oxphenbutazone   Bufferin plain or Extra Strength Feldene Capsules Oxycodone with Aspirin   Bufferin with Codeine Fenoprofen Fenoprofen Pabalate or Pabalate-SF   Buffets II Flogesic Panagesic   Buffinol plain or Extra Strength Florinal or Florinal with Codeine Panwarfarin   Buf-Tabs Flurbiprofen Penicillamine   Butalbital Compound Four-way cold tablets Penicillin   Butazolidin Fragmin Pepto-Bismol   Carbenicillin Geminisyn Percodan   Carna Arthritis Reliever Geopen Persantine   Carprofen Gold's salt Persistin   Chloramphenicol Goody's Phenylbutazone   Chloromycetin Haltrain Piroxlcam   Clmetidine heparin Plaquenil   Cllnoril Hyco-pap Ponstel   Clofibrate Hydroxy chloroquine Propoxyphen         Before stopping any of these medications, be sure to consult the physician who ordered them.  Some, such as Coumadin (Warfarin) are ordered to prevent or treat serious conditions such as "deep thrombosis", "pumonary embolisms", and other heart problems.  The amount of time that you may need off of the medication may also vary with the medication and the reason for which you were taking it.  If you are taking any of these medications, please make sure you notify your pain  physician before you undergo any procedures.         Pain Management Discharge Instructions  General Discharge Instructions :  If you need to reach your doctor call: Monday-Friday 8:00 am - 4:00 pm at [phone] or toll free [phone].  After clinic hours [phone] to have operator reach doctor.  Bring all of your medication bottles to all your appointments in the pain clinic.  To cancel or reschedule your appointment with Pain Management please remember to call 24 hours in advance to avoid a fee.  Refer to the educational materials which you have been given on: General Risks, I had my Procedure. Discharge Instructions, Post Sedation.  Post Procedure Instructions:  The drugs you were given will stay in your system until tomorrow, so  for the next 24 hours you should not drive, make any legal decisions or drink any alcoholic beverages.  You may eat anything you prefer, but it is better to start with liquids then soups and crackers, and gradually work up to solid foods.  Please notify your doctor immediately if you have any unusual bleeding, trouble breathing or pain that is not related to your normal pain.  Depending on the type of procedure that was done, some parts of your body may feel week and/or numb.  This usually clears up by tonight or the next day.  Walk with the use of an assistive device or accompanied by an adult for the 24 hours.  You may use ice on the affected area for the first 24 hours.  Put ice in a Ziploc bag and cover with a towel and place against area 15 minutes on 15 minutes off.  You may switch to heat after 24 hours.

## 2017-05-26 ENCOUNTER — Telehealth: Payer: Self-pay | Admitting: *Deleted

## 2017-05-26 NOTE — Telephone Encounter (Signed)
Denies complications post procedure. 

## 2017-06-09 ENCOUNTER — Ambulatory Visit
Payer: Medicare HMO | Attending: Student in an Organized Health Care Education/Training Program | Admitting: Student in an Organized Health Care Education/Training Program

## 2017-06-09 ENCOUNTER — Other Ambulatory Visit: Payer: Self-pay

## 2017-06-09 ENCOUNTER — Ambulatory Visit
Admission: RE | Admit: 2017-06-09 | Discharge: 2017-06-09 | Disposition: A | Discharge status: Home / Self Care | Payer: Medicare HMO | Source: Ambulatory Visit | Attending: Student in an Organized Health Care Education/Training Program | Admitting: Student in an Organized Health Care Education/Training Program

## 2017-06-09 ENCOUNTER — Encounter: Payer: Self-pay | Admitting: Student in an Organized Health Care Education/Training Program

## 2017-06-09 VITALS — BP 93/65 | HR 101 | Temp 97.8°F | Resp 18 | Ht 66.0 in | Wt 175.0 lb

## 2017-06-09 DIAGNOSIS — M25561 Pain in right knee: Principal | ICD-10-CM

## 2017-06-09 DIAGNOSIS — E785 Hyperlipidemia, unspecified: Secondary | ICD-10-CM | POA: Diagnosis not present

## 2017-06-09 DIAGNOSIS — M9983 Other biomechanical lesions of lumbar region: Secondary | ICD-10-CM

## 2017-06-09 DIAGNOSIS — I10 Essential (primary) hypertension: Secondary | ICD-10-CM | POA: Insufficient documentation

## 2017-06-09 DIAGNOSIS — F17210 Nicotine dependence, cigarettes, uncomplicated: Secondary | ICD-10-CM | POA: Insufficient documentation

## 2017-06-09 DIAGNOSIS — Z7902 Long term (current) use of antithrombotics/antiplatelets: Secondary | ICD-10-CM | POA: Diagnosis not present

## 2017-06-09 DIAGNOSIS — M48061 Spinal stenosis, lumbar region without neurogenic claudication: Secondary | ICD-10-CM

## 2017-06-09 DIAGNOSIS — Z8673 Personal history of transient ischemic attack (TIA), and cerebral infarction without residual deficits: Secondary | ICD-10-CM | POA: Diagnosis not present

## 2017-06-09 DIAGNOSIS — G8929 Other chronic pain: Secondary | ICD-10-CM | POA: Diagnosis not present

## 2017-06-09 DIAGNOSIS — M5416 Radiculopathy, lumbar region: Secondary | ICD-10-CM | POA: Diagnosis not present

## 2017-06-09 DIAGNOSIS — G458 Other transient cerebral ischemic attacks and related syndromes: Secondary | ICD-10-CM | POA: Diagnosis not present

## 2017-06-09 DIAGNOSIS — E0520 Thyrotoxicosis with toxic multinodular goiter without thyrotoxic crisis or storm: Secondary | ICD-10-CM | POA: Diagnosis not present

## 2017-06-09 DIAGNOSIS — Z79899 Other long term (current) drug therapy: Secondary | ICD-10-CM | POA: Insufficient documentation

## 2017-06-09 DIAGNOSIS — M4316 Spondylolisthesis, lumbar region: Secondary | ICD-10-CM | POA: Insufficient documentation

## 2017-06-09 DIAGNOSIS — M5126 Other intervertebral disc displacement, lumbar region: Secondary | ICD-10-CM

## 2017-06-09 DIAGNOSIS — M549 Dorsalgia, unspecified: Secondary | ICD-10-CM | POA: Diagnosis present

## 2017-06-09 DIAGNOSIS — I6782 Cerebral ischemia: Secondary | ICD-10-CM | POA: Diagnosis not present

## 2017-06-09 MED ORDER — PREGABALIN 75 MG PO CAPS: 75.0000 mg | ORAL_CAPSULE | Freq: Three times a day (TID) | ORAL | 1 refills | Status: DC

## 2017-06-09 MED ORDER — MELOXICAM 15 MG PO TABS: 15.0000 mg | ORAL_TABLET | Freq: Every day | ORAL | 0 refills | Status: AC

## 2017-06-09 NOTE — Patient Instructions (Signed)
1. Increase Lyrica to 75 mg three times a day 2. Mobic 15 mg daily for 14 days. Take after meal 3. Right knee xray 4. Follow up in 5-6 weeks

## 2017-06-09 NOTE — Progress Notes (Signed)
Patient's Name: Victoria Gutierrez  MRN: [id number]  Referring Provider: Madelyn Brunner, MD  DOB: 07-31-1945  PCP: Maryland Pink, MD  DOS: 06/09/2017  Note by: Gillis Santa, MD  Service setting: Ambulatory outpatient  Specialty: Interventional Pain Management  Location: [facility] (AMB) Pain Management Facility    Patient type: Established   Primary Reason(s) for Visit: Encounter for post-procedure evaluation of chronic illness with mild to moderate exacerbation CC: Back Pain  HPI  Ms. Ranker is a 71 y.o. year old, female patient, who comes today for a post-procedure evaluation. She has Cerebral vascular disease; Hyperlipidemia; Hypertension; Localized osteoarthritis of hands, bilateral; Multiple joint pain; Psoriasis, unspecified; and Toxic multinodular goiter on their problem list. Her primarily concern today is the Back Pain  Pain Assessment: Location: Left Back Radiating: goes down the left leg. Onset: More than a month ago Duration: Chronic pain Quality: Aching Severity: 2 /10 (self-reported pain score)  Note: Reported level is compatible with observation.                         When using our objective Pain Scale, levels between 6 and 10/10 are said to belong in an emergency room, as it progressively worsens from a 6/10, described as severely limiting, requiring emergency care not usually available at an outpatient pain management facility. At a 6/10 level, communication becomes difficult and requires great effort. Assistance to reach the emergency department may be required. Facial flushing and profuse sweating along with potentially dangerous increases in heart rate and blood pressure will be evident. Effect on ADL: pace self. Timing: Constant Modifying factors: medicines and rest.  Ms. Ladley comes in today for post-procedure evaluation after the treatment done on 05/25/2017.  Further details on both, my assessment(s), as well as the proposed treatment plan, please see  below.  Post-Procedure Assessment  05/25/2017 Procedure: R L4-5 ESI Pre-procedure pain score:  4/10 Post-procedure pain score: 0/10         Influential Factors: BMI: 28.25 kg/m Intra-procedural challenges: None observed.         Assessment challenges: None detected.              Reported side-effects: None.        Post-procedural adverse reactions or complications: None reported         Sedation: Please see nurses note. When no sedatives are used, the analgesic levels obtained are directly associated to the effectiveness of the local anesthetics. However, when sedation is provided, the level of analgesia obtained during the initial 1 hour following the intervention, is believed to be the result of a combination of factors. These factors may include, but are not limited to: 1. The effectiveness of the local anesthetics used. 2. The effects of the analgesic(s) and/or anxiolytic(s) used. 3. The degree of discomfort experienced by the patient at the time of the procedure. 4. The patients ability and reliability in recalling and recording the events. 5. The presence and influence of possible secondary gains and/or psychosocial factors. Reported result: Relief experienced during the 1st hour after the procedure: 20 % (Ultra-Short Term Relief)            Interpretative annotation: Clinically appropriate result. Analgesia during this period is likely to be Local Anesthetic and/or IV Sedative (Analgesic/Anxiolytic) related.          Effects of local anesthetic: The analgesic effects attained during this period are directly associated to the localized infiltration of local anesthetics and therefore cary  significant diagnostic value as to the etiological location, or anatomical origin, of the pain. Expected duration of relief is directly dependent on the pharmacodynamics of the local anesthetic used. Long-acting (4-6 hours) anesthetics used.  Reported result: Relief during the next 4 to 6 hour after the  procedure: 0 % (Short-Term Relief)            Interpretative annotation: Unexpected result. Analgesia during this period is likely to be Local Anesthetic-related.          Long-term benefit: Defined as the period of time past the expected duration of local anesthetics (1 hour for short-acting and 4-6 hours for long-acting). With the possible exception of prolonged sympathetic blockade from the local anesthetics, benefits during this period are typically attributed to, or associated with, other factors such as analgesic sensory neuropraxia, antiinflammatory effects, or beneficial biochemical changes provided by agents other than the local anesthetics.  Reported result: Extended relief following procedure: 0 % (Long-Term Relief)            Interpretative annotation: Unexpected result. No benefit. No permanent benefit expected. Inflammation plays a part in the etiology to the pain.          Current benefits: Defined as reported results that persistent at this point in time.   Analgesia: 0 %            Function: No benefit ROM: No benefit Interpretative annotation: No benefit. No long-term benefit expected. Results would argue against repeating therapy.          Interpretation: Results would suggest failure of therapy in achieving desired goal(s).                  Plan:  Please see "Plan of Care" for details.        Laboratory Chemistry  Inflammation Markers (CRP: Acute Phase) (ESR: Chronic Phase) No results found for: CRP, ESRSEDRATE, LATICACIDVEN               Rheumatology Markers No results found for: RF, ANA, Therisa [place], [facility]              Renal Function Markers Lab Results  Component Value Date   BUN 12 12/30/2014   CREATININE 0.85 12/30/2014   GFRAA >60 12/30/2014   GFRNONAA >60 12/30/2014                 Hepatic Function Markers Lab Results  Component Value Date   AST 24 12/30/2014   ALT 23 12/30/2014   ALBUMIN 4.4 12/30/2014   ALKPHOS 58 12/30/2014    LIPASE 35 12/30/2014                 Electrolytes Lab Results  Component Value Date   NA 138 12/30/2014   K 3.7 12/30/2014   CL 102 12/30/2014   CALCIUM 9.1 12/30/2014                 Neuropathy Markers No results found for: VITAMINB12, FOLATE, HGBA1C, HIV               Bone Pathology Markers No results found for: VD25OH, VD125OH2TOT, [id number], TD3220UR4, 25OHVITD1, 25OHVITD2, 25OHVITD3, TESTOFREE, TESTOSTERONE               Coagulation Parameters Lab Results  Component Value Date   INR 1.0 08/11/2011   LABPROT 13.4 08/11/2011   APTT 29.0 08/11/2011   PLT 204 12/30/2014  Cardiovascular Markers Lab Results  Component Value Date   CKTOTAL 93 08/11/2011   CKMB 0.8 08/11/2011   TROPONINI <0.03 12/30/2014   HGB 15.1 12/30/2014   HCT 44.0 12/30/2014                 CA Markers No results found for: CEA, CA125, LABCA2               Note: Lab results reviewed.    Meds   Current Outpatient Medications:  .  citalopram (CELEXA) 20 MG tablet, TAKE 1 TABLET EVERY NIGHT, Disp: , Rfl:  .  clopidogrel (PLAVIX) 75 MG tablet, TAKE 1 TABLET EVERY DAY, Disp: , Rfl:  .  hydrochlorothiazide (MICROZIDE) 12.5 MG capsule, Take 12.5 mg by mouth daily., Disp: , Rfl:  .  ibuprofen (ADVIL,MOTRIN) 800 MG tablet, Take 800 mg by mouth every 6 (six) hours as needed., Disp: , Rfl:  .  lisinopril (PRINIVIL,ZESTRIL) 20 MG tablet, TAKE 1 TABLET EVERY DAY, Disp: , Rfl:  .  Melatonin 5 MG TBDP, Take 5 mg by mouth at bedtime. , Disp: , Rfl:  .  Potassium 99 MG TABS, Take 0.5 tablets by mouth daily., Disp: , Rfl:  .  pregabalin (LYRICA) 75 MG capsule, Take 1 capsule (75 mg total) by mouth 3 (three) times daily., Disp: 90 capsule, Rfl: 1 .  traMADol (ULTRAM) 50 MG tablet, Take 50 mg by mouth every 12 (twelve) hours as needed., Disp: , Rfl: 0 .  meloxicam (MOBIC) 15 MG tablet, Take 1 tablet (15 mg total) by mouth daily for 14 days., Disp: 14 tablet, Rfl: 0  ROS  Constitutional:  Denies any fever or chills Gastrointestinal: No reported hemesis, hematochezia, vomiting, or acute GI distress Musculoskeletal: Denies any acute onset joint swelling, redness, loss of ROM, or weakness Neurological: No reported episodes of acute onset apraxia, aphasia, dysarthria, agnosia, amnesia, paralysis, loss of coordination, or loss of consciousness  Allergies  Ms. Mcelwee has No Known Allergies.  [place]  Drug: Ms. Ariel  reports that she does not use drugs. Alcohol:  reports that she does not drink alcohol. Tobacco:  reports that she has been smoking cigarettes.  She started smoking about 30 years ago. She has a 15.00 pack-year smoking history. she has never used smokeless tobacco. Medical:  has a past medical history of Hypertension and Stroke ([place]). Surgical: Ms. Moening  has a past surgical history that includes Abdominal hysterectomy. Family: family history includes Heart disease in her father and mother.  Constitutional Exam  General appearance: Well nourished, well developed, and well hydrated. In no apparent acute distress Vitals:   06/09/17 1155  BP: 93/65  Pulse: (!) 101  Resp: 18  Temp: 97.8 F (36.6 C)  TempSrc: Oral  SpO2: 100%  Weight: 175 lb (79.4 kg)  Height: '5\' 6"'$  (1.676 m)   BMI Assessment: Estimated body mass index is 28.25 kg/m as calculated from the following:   Height as of this encounter: '5\' 6"'$  (1.676 m).   Weight as of this encounter: 175 lb (79.4 kg).  BMI interpretation table: BMI level Category Range association with higher incidence of chronic pain  <18 kg/m2 Underweight   18.5-24.9 kg/m2 Ideal body weight   25-29.9 kg/m2 Overweight Increased incidence by 20%  30-34.9 kg/m2 Obese (Class I) Increased incidence by 68%  35-39.9 kg/m2 Severe obesity (Class II) Increased incidence by 136%  >40 kg/m2 Extreme obesity (Class III) Increased incidence by 254%   BMI Readings from Last 4 Encounters:  06/09/17  28.25 kg/m  05/25/17 27.92 kg/m  03/10/17  28.25 kg/m  03/09/17 28.25 kg/m   Wt Readings from Last 4 Encounters:  06/09/17 175 lb (79.4 kg)  05/25/17 173 lb (78.5 kg)  03/10/17 175 lb (79.4 kg)  03/09/17 175 lb (79.4 kg)  Psych/Mental status: Alert, oriented x 3 (person, place, & time)       Eyes: PERLA Respiratory: No evidence of acute respiratory distress  Cervical Spine Area Exam  Skin & Axial Inspection: No masses, redness, edema, swelling, or associated skin lesions Alignment: Symmetrical Functional ROM: Unrestricted ROM      Stability: No instability detected Muscle Tone/Strength: Functionally intact. No obvious neuro-muscular anomalies detected. Sensory (Neurological): Unimpaired Palpation: No palpable anomalies              Upper Extremity (UE) Exam    Side: Right upper extremity  Side: Left upper extremity  Skin & Extremity Inspection: Skin color, temperature, and hair growth are WNL. No peripheral edema or cyanosis. No masses, redness, swelling, asymmetry, or associated skin lesions. No contractures.  Skin & Extremity Inspection: Skin color, temperature, and hair growth are WNL. No peripheral edema or cyanosis. No masses, redness, swelling, asymmetry, or associated skin lesions. No contractures.  Functional ROM: Unrestricted ROM          Functional ROM: Unrestricted ROM          Muscle Tone/Strength: Functionally intact. No obvious neuro-muscular anomalies detected.  Muscle Tone/Strength: Functionally intact. No obvious neuro-muscular anomalies detected.  Sensory (Neurological): Unimpaired          Sensory (Neurological): Unimpaired          Palpation: No palpable anomalies              Palpation: No palpable anomalies              Specialized Test(s): Deferred         Specialized Test(s): Deferred          Thoracic Spine Area Exam  Skin & Axial Inspection: No masses, redness, or swelling Alignment: Symmetrical Functional ROM: Unrestricted ROM Stability: No instability detected Muscle Tone/Strength: Functionally  intact. No obvious neuro-muscular anomalies detected. Sensory (Neurological): Unimpaired Muscle strength & Tone: No palpable anomalies  Lumbar Spine Area Exam  Skin & Axial Inspection: No masses, redness, or swelling Alignment: Asymmetric Functional ROM: Decreased ROM      Stability: No instability detected Muscle Tone/Strength: Functionally intact. No obvious neuro-muscular anomalies detected. Sensory (Neurological): Dermatomal pain pattern as well as articular pain pattern Palpation: Complains of area being tender to palpation Bilateral Fist Percussion Test Provocative Tests: Lumbar Hyperextension and rotation test: Positive on the right for facet joint pain. Lumbar Lateral bending test: Positive ipsilateral radicular pain, on the right. Positive for right-sided foraminal stenosis. Patrick's Maneuver: evaluation deferred today                    Gait & Posture Assessment  Ambulation: Unassisted Gait: Relatively normal for age and body habitus Posture: WNL   Lower Extremity Exam    Side: Right lower extremity  Side: Left lower extremity  Skin & Extremity Inspection: Skin color, temperature, and hair growth are WNL. No peripheral edema or cyanosis. No masses, redness, swelling, asymmetry, or associated skin lesions. No contractures.  Skin & Extremity Inspection: Skin color, temperature, and hair growth are WNL. No peripheral edema or cyanosis. No masses, redness, swelling, asymmetry, or associated skin lesions. No contractures.  Functional ROM: Unrestricted ROM  Functional ROM: Unrestricted ROM          Muscle Tone/Strength: Functionally intact. No obvious neuro-muscular anomalies detected.  Muscle Tone/Strength: Functionally intact. No obvious neuro-muscular anomalies detected.  Sensory (Neurological): Unimpaired  Sensory (Neurological): Unimpaired  Palpation: No palpable anomalies  Palpation: No palpable anomalies   Assessment  Primary Diagnosis & Pertinent Problem List: The  primary encounter diagnosis was Lumbar radiculopathy. Diagnoses of Herniated lumbar intervertebral disc, Neuroforaminal stenosis of lumbar spine, Other specified transient cerebral ischemias, and Chronic pain of right knee were also pertinent to this visit.  Status Diagnosis  Persistent Persistent Persistent 1. Lumbar radiculopathy   2. Herniated lumbar intervertebral disc   3. Neuroforaminal stenosis of lumbar spine   4. Other specified transient cerebral ischemias   5. Chronic pain of right knee      71 year old female with a past medical history of cerebrovascular disease (on Plavix), hyperlipidemia, hypertension, psoriasis, toxic multinodular goiter who presents with right lower back pain that extends into her right hips and right thigh stopping at knee and occasionally radiates down her right leg. The pain started approximately 8 weeks ago. No inciting event, no heavy lifting or straining. Patient states that she has trouble standing up for extended periods of time. Sitting seems to help her pain. No bowel or bladder dysfunction. Patient has seen a chiropractor in the past but chiropractor was limited in what he could do secondary to patient's pain. Patient has tried meloxicam in the past which was somewhat effective. 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  Patient returns for follow-up status post right L3-L4 epidural steroid injection on 05/25/2017 which had no significant impact on her chronic pain symptoms.  At this point I do not recommend repeating this therapy.  We will try to optimize non-opioid analgesics today.  I will have her increase Lyrica to  75 mg 3 times daily along with meloxicam 50 mg daily for 14 days.  I will also obtain x-rays of her right knee to rule out any acute pathology since the patient did sustain a fall weeks ago.  Plan: -Do not recommend repeating lumbar epidural steroid injection -Optimization of non-opioid analgesics including increasing Lyrica 75 mg 3 times daily, meloxicam 15 mg daily for 14 days. -Right knee x-ray. -Follow-up in 4-6 weeks for medication management.  Plan of Care  Pharmacotherapy (Medications Ordered): Meds ordered this encounter  Medications  . pregabalin (LYRICA) 75 MG capsule    Sig: Take 1 capsule (75 mg total) by mouth 3 (three) times daily.    Dispense:  90 capsule    Refill:  1  . meloxicam (MOBIC) 15 MG tablet    Sig: Take 1 tablet (15 mg total) by mouth daily for 14 days.    Dispense:  14 tablet    Refill:  0   Lab-work, procedure(s), and/or referral(s): Orders Placed This Encounter  Procedures  . DG Knee 1-2 Views Right   Time Note: Greater than 50% of the 25 minute(s) of face-to-face time spent with Ms. [place], was spent in counseling/coordination of care regarding: treatment alternatives, medication side effects and realistic expectations.  Provider-requested follow-up: Return in about 6 weeks (around 07/21/2017) for Medication Management.  Future  Appointments  Date Time Provider [place]  07/21/2017 10:30 AM Gillis Santa, MD [facility] None    Primary Care Physician: [place] Pink, MD Location: [facility] Note by: Gillis Santa, M.D Date: 06/09/2017; Time: 1:21 PM  Patient Instructions  1. Increase Lyrica to 75 mg three times a day 2. Mobic 15 mg daily for 14 days. Take after meal 3. Right knee xray 4. Follow up in 5-6 weeks

## 2017-06-09 NOTE — Progress Notes (Signed)
Safety precautions to be maintained throughout the outpatient stay will include: orient to surroundings, keep bed in low position, maintain call bell within reach at all times, provide assistance with transfer out of bed and ambulation.  

## 2017-06-14 ENCOUNTER — Telehealth: Payer: Self-pay

## 2017-06-14 NOTE — Telephone Encounter (Signed)
Would you like [place] to call her, or would you like too?  Please advise. Thanks!!!!!

## 2017-06-16 NOTE — Telephone Encounter (Signed)
Spoke with patient and read findings from knee xray to her with no elaboration.  Patient continued with questions about treatment plan that I was unable to answer.

## 2017-06-16 NOTE — Telephone Encounter (Signed)
Patient called asking to get results of Knee xray, appt not until 07-21-17

## 2017-06-16 NOTE — Telephone Encounter (Signed)
Patient states that she does not have enough mobic to last until next appt time.  Continues to have pain in the sharp pain in the knee with some burning sensation from knee down to ankle.

## 2017-07-21 ENCOUNTER — Encounter: Payer: Self-pay | Admitting: Student in an Organized Health Care Education/Training Program

## 2017-07-21 ENCOUNTER — Other Ambulatory Visit: Payer: Self-pay

## 2017-07-21 ENCOUNTER — Ambulatory Visit
Payer: Medicare HMO | Attending: Student in an Organized Health Care Education/Training Program | Admitting: Student in an Organized Health Care Education/Training Program

## 2017-07-21 VITALS — BP 121/81 | HR 93 | Temp 98.7°F | Resp 16 | Ht 66.0 in | Wt 174.0 lb

## 2017-07-21 DIAGNOSIS — M5126 Other intervertebral disc displacement, lumbar region: Secondary | ICD-10-CM | POA: Diagnosis not present

## 2017-07-21 DIAGNOSIS — M5116 Intervertebral disc disorders with radiculopathy, lumbar region: Secondary | ICD-10-CM | POA: Diagnosis not present

## 2017-07-21 DIAGNOSIS — Z5181 Encounter for therapeutic drug level monitoring: Secondary | ICD-10-CM | POA: Insufficient documentation

## 2017-07-21 DIAGNOSIS — I10 Essential (primary) hypertension: Secondary | ICD-10-CM | POA: Insufficient documentation

## 2017-07-21 DIAGNOSIS — Z79899 Other long term (current) drug therapy: Secondary | ICD-10-CM | POA: Insufficient documentation

## 2017-07-21 DIAGNOSIS — F17210 Nicotine dependence, cigarettes, uncomplicated: Secondary | ICD-10-CM | POA: Insufficient documentation

## 2017-07-21 DIAGNOSIS — Z8673 Personal history of transient ischemic attack (TIA), and cerebral infarction without residual deficits: Secondary | ICD-10-CM | POA: Insufficient documentation

## 2017-07-21 DIAGNOSIS — M48061 Spinal stenosis, lumbar region without neurogenic claudication: Secondary | ICD-10-CM | POA: Diagnosis not present

## 2017-07-21 DIAGNOSIS — G458 Other transient cerebral ischemic attacks and related syndromes: Secondary | ICD-10-CM | POA: Diagnosis not present

## 2017-07-21 DIAGNOSIS — E0520 Thyrotoxicosis with toxic multinodular goiter without thyrotoxic crisis or storm: Secondary | ICD-10-CM | POA: Insufficient documentation

## 2017-07-21 DIAGNOSIS — L409 Psoriasis, unspecified: Secondary | ICD-10-CM | POA: Diagnosis not present

## 2017-07-21 DIAGNOSIS — E785 Hyperlipidemia, unspecified: Secondary | ICD-10-CM | POA: Insufficient documentation

## 2017-07-21 DIAGNOSIS — M5416 Radiculopathy, lumbar region: Secondary | ICD-10-CM

## 2017-07-21 DIAGNOSIS — M9983 Other biomechanical lesions of lumbar region: Secondary | ICD-10-CM | POA: Diagnosis not present

## 2017-07-21 DIAGNOSIS — G8929 Other chronic pain: Secondary | ICD-10-CM | POA: Diagnosis not present

## 2017-07-21 DIAGNOSIS — M25561 Pain in right knee: Secondary | ICD-10-CM

## 2017-07-21 MED ORDER — PREGABALIN 100 MG PO CAPS: 100.0000 mg | ORAL_CAPSULE | Freq: Three times a day (TID) | ORAL | 3 refills | Status: DC

## 2017-07-21 NOTE — Patient Instructions (Addendum)
Increase Lyrica to 100 mg three times a day  Rx for 3 months given to patient

## 2017-07-21 NOTE — Progress Notes (Signed)
Safety precautions to be maintained throughout the outpatient stay will include: orient to surroundings, keep bed in low position, maintain call bell within reach at all times, provide assistance with transfer out of bed and ambulation.  

## 2017-07-21 NOTE — Progress Notes (Signed)
Patient's Name: Victoria Gutierrez  MRN: [id number]  Referring Provider: Maryland Pink, MD  DOB: February 02, 1946  PCP: Maryland Pink, MD  DOS: 07/21/2017  Note by: Victoria Santa, MD  Service setting: Ambulatory outpatient  Specialty: Interventional Pain Management  Location: [facility] (AMB) Pain Management Facility    Patient type: Established   Primary Reason(s) for Visit: Encounter for prescription drug management. (Level of risk: moderate)  CC: Back Pain  HPI  Victoria Gutierrez is a 72 y.o. year old, female patient, who comes today for a medication management evaluation. She has Cerebral vascular disease; Hyperlipidemia; Hypertension; Localized osteoarthritis of hands, bilateral; Multiple joint pain; Psoriasis, unspecified; and Toxic multinodular goiter on their problem list. Her primarily concern today is the Back Pain  Pain Assessment: Location: Medial Back Radiating: goes down both legs, worse on the right. Onset: More than a month ago Duration: Chronic pain Quality: Aching, Sharp Severity: 5 /10 (self-reported pain score)  Note: Reported level is inconsistent with clinical observations. Clinically the patient looks like a 2/10 A 2/10 is viewed as "Mild to Moderate" and described as noticeable and distracting. Impossible to hide from other people. More frequent flare-ups. Still possible to adapt and function close to normal. It can be very annoying and may have occasional stronger flare-ups. With discipline, patients may get used to it and adapt.       When using our objective Pain Scale, levels between 6 and 10/10 are said to belong in an emergency room, as it progressively worsens from a 6/10, described as severely limiting, requiring emergency care not usually available at an outpatient pain management facility. At a 6/10 level, communication becomes difficult and requires great effort. Assistance to reach the emergency department may be required. Facial flushing and profuse sweating along with potentially  dangerous increases in heart rate and blood pressure will be evident. Effect on ADL: paxce self. Timing: Constant Modifying factors: medicines and rest.  Ms. Haydon was last scheduled for an appointment on 06/09/2017 for medication management. During today's appointment we reviewed Victoria Gutierrez chronic pain status, as well as her outpatient medication regimen.  The patient  reports that she does not use drugs. Her body mass index is 28.08 kg/m.  Further details on both, my assessment(s), as well as the proposed treatment plan, please see below.  Patient presents today for follow-up.  Right knee x-ray was reviewed which did not show any acute fractures. Patient did not notice any benefit from meloxicam 50 mg daily.  Is endorsing benefit from Lyrica 75 mg 3 times a day without any significant side effects.  Will increase dose to 100 mg 3 times daily.  Laboratory Chemistry  Inflammation Markers (CRP: Acute Phase) (ESR: Chronic Phase) No results found for: CRP, ESRSEDRATE, LATICACIDVEN               Rheumatology Markers No results found for: RF, ANA, Therisa Doyne, [facility]              Renal Function Markers Lab Results  Component Value Date   BUN 12 12/30/2014   CREATININE 0.85 12/30/2014   GFRAA >60 12/30/2014   GFRNONAA >60 12/30/2014                 Hepatic Function Markers Lab Results  Component Value Date   AST 24 12/30/2014   ALT 23 12/30/2014   ALBUMIN 4.4 12/30/2014   ALKPHOS 58 12/30/2014   LIPASE 35 12/30/2014  Electrolytes Lab Results  Component Value Date   NA 138 12/30/2014   K 3.7 12/30/2014   CL 102 12/30/2014   CALCIUM 9.1 12/30/2014                 Neuropathy Markers No results found for: VITAMINB12, FOLATE, HGBA1C, HIV               Bone Pathology Markers No results found for: VD25OH, Gertie Baron, [id number], [id number], 25OHVITD1, 25OHVITD2, 25OHVITD3, TESTOFREE, TESTOSTERONE               Coagulation  Parameters Lab Results  Component Value Date   INR 1.0 08/11/2011   LABPROT 13.4 08/11/2011   APTT 29.0 08/11/2011   PLT 204 12/30/2014                 Cardiovascular Markers Lab Results  Component Value Date   CKTOTAL 93 08/11/2011   CKMB 0.8 08/11/2011   TROPONINI <0.03 12/30/2014   HGB 15.1 12/30/2014   HCT 44.0 12/30/2014                 CA Markers No results found for: CEA, CA125, LABCA2               Note: Lab results reviewed.  Recent Diagnostic Imaging Results  DG Knee 1-2 Views Right CLINICAL DATA:  Pain in the right knee for several months after a fall  EXAM: RIGHT KNEE - 1-2 VIEW  COMPARISON:  None.  FINDINGS: Consider the patient's age, the knee joint spaces are well preserved. Very little degenerative spurring is present. No fracture is seen in no definite joint effusion is noted. Some calcification of the superficial femoral artery is noted into the popliteal artery.  IMPRESSION: No acute abnormality. The joint spaces are well preserved for age. No joint effusion.  Electronically Signed   By: Ivar Drape M.D.   On: 06/09/2017 14:57  Complexity Note: Imaging results reviewed. Results shared with Ms. Randleman, using Layman's terms.                         Meds   Current Outpatient Medications:  .  citalopram (CELEXA) 20 MG tablet, Take 20 mg by mouth daily., Disp: , Rfl:  .  clopidogrel (PLAVIX) 75 MG tablet, TAKE 1 TABLET EVERY DAY, Disp: , Rfl:  .  hydrochlorothiazide (MICROZIDE) 12.5 MG capsule, Take 12.5 mg by mouth daily., Disp: , Rfl:  .  ibuprofen (ADVIL,MOTRIN) 800 MG tablet, Take 800 mg by mouth every 6 (six) hours as needed., Disp: , Rfl:  .  lisinopril (PRINIVIL,ZESTRIL) 20 MG tablet, TAKE 1 TABLET EVERY DAY, Disp: , Rfl:  .  Melatonin 5 MG TBDP, Take 5 mg by mouth at bedtime. , Disp: , Rfl:  .  Potassium 99 MG TABS, Take 0.5 tablets by mouth daily., Disp: , Rfl:  .  pregabalin (LYRICA) 100 MG capsule, Take 1 capsule (100 mg total) by  mouth 3 (three) times daily., Disp: 90 capsule, Rfl: 3 .  traMADol (ULTRAM) 50 MG tablet, Take 50 mg by mouth every 12 (twelve) hours as needed., Disp: , Rfl: 0  ROS  Constitutional: Denies any fever or chills Gastrointestinal: No reported hemesis, hematochezia, vomiting, or acute GI distress Musculoskeletal: Denies any acute onset joint swelling, redness, loss of ROM, or weakness Neurological: No reported episodes of acute onset apraxia, aphasia, dysarthria, agnosia, amnesia, paralysis, loss of coordination, or loss of consciousness  Allergies  Ms. Enslow has  No Known Allergies.  [place]  Drug: Ms. Domke  reports that she does not use drugs. Alcohol:  reports that she does not drink alcohol. Tobacco:  reports that she has been smoking cigarettes.  She started smoking about 30 years ago. She has a 15.00 pack-year smoking history. she has never used smokeless tobacco. Medical:  has a past medical history of Hypertension and Stroke ([place]). Surgical: Ms. Zook  has a past surgical history that includes Abdominal hysterectomy. Family: family history includes Heart disease in her father and mother.  Constitutional Exam  General appearance: Well nourished, well developed, and well hydrated. In no apparent acute distress Vitals:   07/21/17 1059  BP: 121/81  Pulse: 93  Resp: 16  Temp: 98.7 F (37.1 C)  TempSrc: Oral  SpO2: 98%  Weight: 174 lb (78.9 kg)  Height: 5' 6"  (1.676 m)   BMI Assessment: Estimated body mass index is 28.08 kg/m as calculated from the following:   Height as of this encounter: 5' 6"  (1.676 m).   Weight as of this encounter: 174 lb (78.9 kg).  BMI interpretation table: BMI level Category Range association with higher incidence of chronic pain  <18 kg/m2 Underweight   18.5-24.9 kg/m2 Ideal body weight   25-29.9 kg/m2 Overweight Increased incidence by 20%  30-34.9 kg/m2 Obese (Class I) Increased incidence by 68%  35-39.9 kg/m2 Severe obesity (Class II) Increased  incidence by 136%  >40 kg/m2 Extreme obesity (Class III) Increased incidence by 254%   BMI Readings from Last 4 Encounters:  07/21/17 28.08 kg/m  06/09/17 28.25 kg/m  05/25/17 27.92 kg/m  03/10/17 28.25 kg/m   Wt Readings from Last 4 Encounters:  07/21/17 174 lb (78.9 kg)  06/09/17 175 lb (79.4 kg)  05/25/17 173 lb (78.5 kg)  03/10/17 175 lb (79.4 kg)  Psych/Mental status: Alert, oriented x 3 (person, place, & time)       Eyes: PERLA Respiratory: No evidence of acute respiratory distress  Cervical Spine Area Exam  Skin & Axial Inspection: No masses, redness, edema, swelling, or associated skin lesions Alignment: Symmetrical Functional ROM: Unrestricted ROM      Stability: No instability detected Muscle Tone/Strength: Functionally intact. No obvious neuro-muscular anomalies detected. Sensory (Neurological): Unimpaired Palpation: No palpable anomalies              Upper Extremity (UE) Exam    Side: Right upper extremity  Side: Left upper extremity  Skin & Extremity Inspection: Skin color, temperature, and hair growth are WNL. No peripheral edema or cyanosis. No masses, redness, swelling, asymmetry, or associated skin lesions. No contractures.  Skin & Extremity Inspection: Skin color, temperature, and hair growth are WNL. No peripheral edema or cyanosis. No masses, redness, swelling, asymmetry, or associated skin lesions. No contractures.  Functional ROM: Unrestricted ROM          Functional ROM: Unrestricted ROM          Muscle Tone/Strength: Functionally intact. No obvious neuro-muscular anomalies detected.  Muscle Tone/Strength: Functionally intact. No obvious neuro-muscular anomalies detected.  Sensory (Neurological): Unimpaired          Sensory (Neurological): Unimpaired          Palpation: No palpable anomalies              Palpation: No palpable anomalies              Specialized Test(s): Deferred         Specialized Test(s): Deferred  Thoracic Spine Area Exam   Skin & Axial Inspection: No masses, redness, or swelling Alignment: Symmetrical Functional ROM: Unrestricted ROM Stability: No instability detected Muscle Tone/Strength: Functionally intact. No obvious neuro-muscular anomalies detected. Sensory (Neurological): Unimpaired Muscle strength & Tone: No palpable anomalies Lumbar Spine Area Exam  Skin & Axial Inspection:No masses, redness, or swelling Alignment:Asymmetric Functional VFI:EPPIRJJOA ROM Stability:No instability detected Muscle Tone/Strength:Functionally intact. No obvious neuro-muscular anomalies detected. Sensory (Neurological):Dermatomal pain patternas well as articular pain pattern Palpation:Complains of area being tender to palpationBilateral Fist Percussion Test Provocative Tests: Lumbar Hyperextension and rotation test:Positiveon the right for facet joint pain. Lumbar Lateral bending test:Positiveipsilateral radicular pain, on the right. Positive for right-sided foraminal stenosis. Patrick's Maneuver:evaluation deferred today  Gait & Posture Assessment  Ambulation: Unassisted Gait: Relatively normal for age and body habitus Posture: WNL   Lower Extremity Exam    Side: Right lower extremity  Side: Left lower extremity  Skin & Extremity Inspection: Skin color, temperature, and hair growth are WNL. No peripheral edema or cyanosis. No masses, redness, swelling, asymmetry, or associated skin lesions. No contractures.  Skin & Extremity Inspection: Skin color, temperature, and hair growth are WNL. No peripheral edema or cyanosis. No masses, redness, swelling, asymmetry, or associated skin lesions. No contractures.  Functional ROM: Unrestricted ROM          Functional ROM: Unrestricted ROM          Muscle Tone/Strength: Functionally intact. No obvious neuro-muscular anomalies detected.  Muscle Tone/Strength: Functionally intact. No obvious neuro-muscular anomalies detected.  Sensory  (Neurological): Unimpaired  Sensory (Neurological): Unimpaired  Palpation: No palpable anomalies  Palpation: No palpable anomalies    Assessment  Primary Diagnosis & Pertinent Problem List: The primary encounter diagnosis was Lumbar radiculopathy. Diagnoses of Herniated lumbar intervertebral disc, Neuroforaminal stenosis of lumbar spine, Other specified transient cerebral ischemias, and Chronic pain of right knee were also pertinent to this visit.  Status Diagnosis  Persistent Persistent Persistent 1. Lumbar radiculopathy   2. Herniated lumbar intervertebral disc   3. Neuroforaminal stenosis of lumbar spine   4. Other specified transient cerebral ischemias   5. Chronic pain of right knee     72 year old female with a past medical history of cerebrovascular disease (on Plavix), hyperlipidemia, hypertension, psoriasis, toxic multinodular goiter who presents with right lower back pain that extends into herrighthips and right thigh stopping at kneeand occasionally radiates down her rightleg.No inciting event, no heavy lifting or straining. Patient states that she has trouble standing up for extended periods of time. Sitting seems to help her pain. No bowel or bladder dysfunction.Patient has seen a chiropractor in the pastbutchiropractor was limited in what he could do secondary to patient's pain. Patient has tried meloxicam in the past which was somewhat effective. 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 status post right L3-L4 epidural steroid injection on 05/25/2017 which had no significant impact on her chronic pain symptoms.  Patient presents today  for follow-up.  Right knee x-ray was reviewed which did not show any acute fractures.  Patient did not notice any benefit from meloxicam 50 mg daily.  Is endorsing benefit from Lyrica 75 mg 3 times a day without any significant side effects.  Will  increase dose to 100 mg 3 times daily.   Plan of Care  Pharmacotherapy (Medications Ordered): Meds ordered this encounter  Medications  . pregabalin (LYRICA) 100 MG capsule    Sig: Take 1 capsule (100 mg total) by mouth 3 (three) times daily.    Dispense:  90 capsule    Refill:  3    Do not place this medication, or any other prescription from our practice, on "Automatic Refill". Patient may have prescription filled one day early if pharmacy is closed on scheduled refill date.    Provider-requested follow-up: Return in about 3 months (around 10/19/2017) for Medication Management.  Future Appointments  Date Time Provider [place]  10/18/2017 10:45 AM Victoria Santa, MD [facility] None    Primary Care Physician: Maryland Pink, MD Location: [facility] Note by: Victoria Gutierrez, M.D Date: 07/21/2017; Time: 12:59 PM  Patient Instructions  Increase Lyrica to 100 mg three times a day  Rx for 3 months given to patient

## 2017-10-18 ENCOUNTER — Encounter: Payer: Self-pay | Admitting: Student in an Organized Health Care Education/Training Program

## 2017-12-01 DIAGNOSIS — L409 Psoriasis, unspecified: Secondary | ICD-10-CM | POA: Diagnosis not present

## 2017-12-01 DIAGNOSIS — G8929 Other chronic pain: Secondary | ICD-10-CM | POA: Diagnosis not present

## 2017-12-01 DIAGNOSIS — M5441 Lumbago with sciatica, right side: Secondary | ICD-10-CM | POA: Diagnosis not present

## 2017-12-01 DIAGNOSIS — M25552 Pain in left hip: Secondary | ICD-10-CM | POA: Diagnosis not present

## 2017-12-01 DIAGNOSIS — L989 Disorder of the skin and subcutaneous tissue, unspecified: Secondary | ICD-10-CM | POA: Diagnosis not present

## 2017-12-16 DIAGNOSIS — L989 Disorder of the skin and subcutaneous tissue, unspecified: Secondary | ICD-10-CM | POA: Diagnosis not present

## 2017-12-28 ENCOUNTER — Ambulatory Visit: Admit: 2017-12-28 | Discharge: 2017-12-29 | Payer: MEDICARE

## 2017-12-28 DIAGNOSIS — Z1159 Encounter for screening for other viral diseases: Secondary | ICD-10-CM

## 2017-12-28 DIAGNOSIS — Z79899 Other long term (current) drug therapy: Secondary | ICD-10-CM | POA: Diagnosis not present

## 2017-12-28 DIAGNOSIS — L409 Psoriasis, unspecified: Secondary | ICD-10-CM | POA: Diagnosis not present

## 2017-12-28 MED ORDER — CLOBETASOL 0.05 % SCALP SOLUTION
5 refills | 0 days | Status: CP
Start: 2017-12-28 — End: 2019-01-16

## 2017-12-28 MED ORDER — ADALIMUMAB 40 MG/0.8 ML SUBCUTANEOUS PEN KIT
0 refills | 0 days | Status: CP
Start: 2017-12-28 — End: 2018-02-28

## 2017-12-28 NOTE — Unmapped (Signed)
Dermatology Consult Note    A/P:    1. Scalp psoriasis - severe  - No cutaneous psoriasis. Unable to check nails. Prior evaluation with rheumatology with OA, not psoriatic arthritis.   - In past, failed topical steroids and ILK  - While we await Humira approval (worked well for sister), discussed use of the following treatments:   - Start clobetasol (TEMOVATE) 0.05 % external solution; Continue to apply twice daily to the scalp for itching  Dispense: 50 mL; Refill: 5  - Start OTC T-sal shampoo to break up the scales to allow for better penetration of topical steroid solution   - If Humira not covered or with a reasonable co-pay, then will pursue MTX 10mg  weekly with folic acid   - Side effects reviewed   - START adalimumab (HUMIRA) 40 mg/0.8 mL subcutaneous pen kit; 80 mg SQ first dose then 40 mg q 2weeks  Dispense: 7 each; Refill: 0    2. High risk medication use (Humira) - advised to have completed at [facility]   - CBC w/ Differential  - AST  - ALT  - BUN  - Creatinine  - Hepatitis B Core Antibody, total  - Hepatitis B Surface Antibody  - Hepatitis B Surface Antigen  - Hepatitis C Antibody  - Quantiferon TB Gold    Return in about 2 months (around 02/28/2018).        CC:  Consult for psoriasis     HPI:  Diane Pugh is a friendly 72 y.o. year old female seen today in consultation by Abbie Sons, MD at the request of Dr. Lynnea Ferrier for evaluation of psoriasis of scalp. Present 9 years. Continues to worsen. Very itchy with thick scales. In past, used multiple topical steroids and ILK injections which only help for a day or so. Previously saw a rheumatologist who dx OA. Continues to have joint pain in her hands. They tried to get Humira for her, but it was a large copay. We are unsure how far they went with the process. Denies cutaneous or nail involvement. Sister with nail psoriasis for which Humira works well. Patient would like to try this again. Denies history of liver dx or cancer.     Pertinent PMH:   No history of skin cancer   Scalp psoriasis     FH:   No family history of melanoma     SH:   Here with sister     ROS:  All other systems reviewed are negative.     PE:  General: Friendly and conversational female in no distress, resting comfortably.  Neuro: Alert and oriented x 3, answering questions appropriately.  Skin: Inspection and palpation of the head, neck, chest, abdomen, back, bilateral upper extremities, bilateral lower extremities was performed and notable for the following:  1. Significant thick erythematous scaly plaques nearly confluent of the scalp extending down towards posterior ears and neck   2. Unable to check nails with artificial nails in place   3. No cutaneous psoriasis     All other areas examined were normal or had no significant findings.   ______________________________________________________________________    The patient was seen and examined by Abbie Sons, MD who agrees with the assessment and plan as above.

## 2017-12-28 NOTE — Unmapped (Signed)
Per test claim for Humira at the [facility], patient needs Medication Assistance Program for Prior Authorization.

## 2017-12-28 NOTE — Unmapped (Addendum)
Recommend T-sal shampoo to use to break up the scale on the scalp. Then use the clobetasol solution twice daily for the itching.         Let's get labs at [facility]   I sent in the Humira to the [facility] and we will see if this can be covered with an appropriate copay     If this is not covered, we will use methotrexate pills instead.

## 2018-01-03 LAB — CBC W/ DIFFERENTIAL
BANDED NEUTROPHILS ABSOLUTE COUNT: 0 10*3/uL (ref 0.0–0.1)
BASOPHILS ABSOLUTE COUNT: 0 10*3/uL (ref 0.0–0.2)
BASOPHILS RELATIVE PERCENT: 0 %
EOSINOPHILS ABSOLUTE COUNT: 0.3 10*3/uL (ref 0.0–0.4)
EOSINOPHILS RELATIVE PERCENT: 4 %
HEMATOCRIT: 42.7 % (ref 34.0–46.6)
HEMOGLOBIN: 14.6 g/dL (ref 11.1–15.9)
IMMATURE GRANULOCYTES: 0 %
LYMPHOCYTES ABSOLUTE COUNT: 2.8 10*3/uL (ref 0.7–3.1)
LYMPHOCYTES RELATIVE PERCENT: 33 %
MEAN CORPUSCULAR HEMOGLOBIN CONC: 34.2 g/dL (ref 31.5–35.7)
MEAN CORPUSCULAR HEMOGLOBIN: 32.2 pg (ref 26.6–33.0)
MEAN CORPUSCULAR VOLUME: 94 fL (ref 79–97)
MONOCYTES ABSOLUTE COUNT: 0.7 10*3/uL (ref 0.1–0.9)
NEUTROPHILS ABSOLUTE COUNT: 4.6 10*3/uL (ref 1.4–7.0)
RED CELL DISTRIBUTION WIDTH: 12.8 % (ref 12.3–15.4)
WHITE BLOOD CELL COUNT: 8.4 10*3/uL (ref 3.4–10.8)

## 2018-01-03 LAB — HEPATITIS B SURFACE ANTIGEN
HEPATITIS B SURFACE ANTIGEN: NEGATIVE
Lab: NEGATIVE

## 2018-01-03 LAB — QUANTIFERON MITOGEN VALUE: Lab: >10

## 2018-01-03 LAB — CREATININE
GFR MDRD AF AMER: 75 mL/min/{1.73_m2}
GFR MDRD NON AF AMER: 65 mL/min/{1.73_m2}

## 2018-01-03 LAB — AST (SGOT): Lab: 19

## 2018-01-03 LAB — QUANTIFERON TB GOLD
QUANTIFERON MITOGEN VALUE: >10 [IU]/mL
QUANTIFERON TB GOLD: NEGATIVE
QUANTIFERON TB2 AG VALUE: 0.12 [IU]/mL

## 2018-01-03 LAB — ALT (SGPT): Lab: 16

## 2018-01-03 LAB — EOSINOPHILS ABSOLUTE COUNT: Lab: 0.3

## 2018-01-03 LAB — HEP C VIRUS AB: Lab: <0.1

## 2018-01-03 LAB — GFR MDRD NON AF AMER: Lab: 65

## 2018-01-03 LAB — HEPATITIS B CORE TOTAL ANTIBODY: Lab: NEGATIVE

## 2018-01-03 LAB — BLOOD UREA NITROGEN: Lab: 12

## 2018-01-03 LAB — HEP B SURFACE AB, QUAL: Lab: NONREACTIVE

## 2018-01-03 NOTE — Unmapped (Signed)
Discussed normal lab results with patient. Still awaiting status of Humira approval. Discussed this is likely delayed with the holiday.

## 2018-01-09 MED ORDER — FOLIC ACID 1 MG TABLET
ORAL_TABLET | 3 refills | 0.00000 days | Status: CP
Start: 2018-01-09 — End: 2018-02-28

## 2018-01-09 MED ORDER — METHOTREXATE SODIUM 2.5 MG TABLET
ORAL_TABLET | ORAL | 2 refills | 0.00000 days | Status: CP
Start: 2018-01-09 — End: 2018-02-28

## 2018-01-09 NOTE — Unmapped (Signed)
Call from patient.  States Humira not approved and wants to try the other medication-MTX.

## 2018-01-11 NOTE — Unmapped (Signed)
Called patient. Humira was thousands of dollars co-pay and did not qualify for manufacture assistance.     Discussed starting MTX. Sent in prescription to take 10mg  weekly of MTX and folic acid. Side effects reviewed. Noted it may take a number of weeks in order to see improvement. Patient in agreement.     Will review labs at next visit in 6 weeks with Dr. Linton Rump.

## 2018-01-24 DIAGNOSIS — L989 Disorder of the skin and subcutaneous tissue, unspecified: Secondary | ICD-10-CM | POA: Diagnosis not present

## 2018-01-24 DIAGNOSIS — D2372 Other benign neoplasm of skin of left lower limb, including hip: Secondary | ICD-10-CM | POA: Diagnosis not present

## 2018-01-24 NOTE — Unmapped (Signed)
Per Referral: Spoke with patient and at this time she would not like to apply for assistance for Humira. She has decided with provider to take another medication for now.

## 2018-02-15 DIAGNOSIS — M5126 Other intervertebral disc displacement, lumbar region: Secondary | ICD-10-CM | POA: Diagnosis not present

## 2018-02-23 ENCOUNTER — Other Ambulatory Visit: Payer: Self-pay | Admitting: Anesthesiology

## 2018-02-23 DIAGNOSIS — M5126 Other intervertebral disc displacement, lumbar region: Secondary | ICD-10-CM

## 2018-02-28 ENCOUNTER — Ambulatory Visit: Admit: 2018-02-28 | Discharge: 2018-03-01 | Payer: MEDICARE

## 2018-02-28 DIAGNOSIS — Z79899 Other long term (current) drug therapy: Secondary | ICD-10-CM

## 2018-02-28 DIAGNOSIS — L409 Psoriasis, unspecified: Principal | ICD-10-CM

## 2018-02-28 MED ORDER — FOLIC ACID 1 MG TABLET
ORAL_TABLET | 3 refills | 0 days | Status: CP
Start: 2018-02-28 — End: 2018-05-30

## 2018-02-28 MED ORDER — METHOTREXATE SODIUM 2.5 MG TABLET
ORAL_TABLET | 3 refills | 0 days | Status: CP
Start: 2018-02-28 — End: 2018-05-30

## 2018-03-03 DIAGNOSIS — Z79899 Other long term (current) drug therapy: Secondary | ICD-10-CM | POA: Diagnosis not present

## 2018-03-03 DIAGNOSIS — H6123 Impacted cerumen, bilateral: Secondary | ICD-10-CM | POA: Diagnosis not present

## 2018-03-03 DIAGNOSIS — H903 Sensorineural hearing loss, bilateral: Secondary | ICD-10-CM | POA: Diagnosis not present

## 2018-03-03 DIAGNOSIS — L409 Psoriasis, unspecified: Secondary | ICD-10-CM | POA: Diagnosis not present

## 2018-03-10 ENCOUNTER — Ambulatory Visit: Admission: RE | Admit: 2018-03-10 | Payer: Medicare HMO | Source: Ambulatory Visit

## 2018-03-18 ENCOUNTER — Emergency Department: Payer: Medicare HMO

## 2018-03-18 ENCOUNTER — Other Ambulatory Visit: Payer: Self-pay

## 2018-03-18 ENCOUNTER — Emergency Department
Admission: EM | Admit: 2018-03-18 | Discharge: 2018-03-18 | Disposition: A | Discharge status: Home / Self Care | Payer: Medicare HMO | Attending: Emergency Medicine | Admitting: Emergency Medicine

## 2018-03-18 ENCOUNTER — Encounter: Payer: Self-pay | Admitting: Emergency Medicine

## 2018-03-18 DIAGNOSIS — Z79899 Other long term (current) drug therapy: Secondary | ICD-10-CM | POA: Insufficient documentation

## 2018-03-18 DIAGNOSIS — I10 Essential (primary) hypertension: Secondary | ICD-10-CM | POA: Insufficient documentation

## 2018-03-18 DIAGNOSIS — K5730 Diverticulosis of large intestine without perforation or abscess without bleeding: Secondary | ICD-10-CM | POA: Diagnosis not present

## 2018-03-18 DIAGNOSIS — Z7902 Long term (current) use of antithrombotics/antiplatelets: Secondary | ICD-10-CM | POA: Insufficient documentation

## 2018-03-18 DIAGNOSIS — F17210 Nicotine dependence, cigarettes, uncomplicated: Secondary | ICD-10-CM | POA: Insufficient documentation

## 2018-03-18 DIAGNOSIS — Z8673 Personal history of transient ischemic attack (TIA), and cerebral infarction without residual deficits: Secondary | ICD-10-CM | POA: Insufficient documentation

## 2018-03-18 DIAGNOSIS — R109 Unspecified abdominal pain: Secondary | ICD-10-CM | POA: Diagnosis present

## 2018-03-18 DIAGNOSIS — R319 Hematuria, unspecified: Secondary | ICD-10-CM | POA: Diagnosis not present

## 2018-03-18 DIAGNOSIS — K5790 Diverticulosis of intestine, part unspecified, without perforation or abscess without bleeding: Secondary | ICD-10-CM | POA: Insufficient documentation

## 2018-03-18 LAB — COMPREHENSIVE METABOLIC PANEL
ALBUMIN: 4.3 g/dL (ref 3.5–5.0)
ALK PHOS: 64 U/L (ref 38–126)
ALT: 25 U/L (ref 0–44)
AST: 23 U/L (ref 15–41)
Anion gap: 8 (ref 5–15)
BILIRUBIN TOTAL: 0.8 mg/dL (ref 0.3–1.2)
BUN: 16 mg/dL (ref 8–23)
CALCIUM: 9.2 mg/dL (ref 8.9–10.3)
CO2: 26 mmol/L (ref 22–32)
Chloride: 107 mmol/L (ref 98–111)
Creatinine, Ser: 0.95 mg/dL (ref 0.44–1.00)
GFR calc Af Amer: >60 mL/min (ref >60)
GFR, EST NON AFRICAN AMERICAN: 58 mL/min — AB (ref >60)
GLUCOSE: 143 mg/dL — AB (ref 70–99)
POTASSIUM: 4.5 mmol/L (ref 3.5–5.1)
Sodium: 141 mmol/L (ref 135–145)
TOTAL PROTEIN: 6.7 g/dL (ref 6.5–8.1)

## 2018-03-18 LAB — URINALYSIS, COMPLETE (UACMP) WITH MICROSCOPIC
Bacteria, UA: NONE SEEN
Bilirubin Urine: NEGATIVE
GLUCOSE, UA: NEGATIVE mg/dL
Ketones, ur: NEGATIVE mg/dL
LEUKOCYTES UA: NEGATIVE
NITRITE: NEGATIVE
PH: 7 (ref 5.0–8.0)
PROTEIN: 30 mg/dL — AB
RBC / HPF: >50 RBC/hpf — ABNORMAL HIGH (ref 0–5)
SPECIFIC GRAVITY, URINE: 1.014 (ref 1.005–1.030)

## 2018-03-18 LAB — LIPASE, BLOOD: Lipase: 31 U/L (ref 11–51)

## 2018-03-18 LAB — CBC
HEMATOCRIT: 41.1 % (ref 35.0–47.0)
Hemoglobin: 14.3 g/dL (ref 12.0–16.0)
MCH: 34.3 pg — ABNORMAL HIGH (ref 26.0–34.0)
MCHC: 34.7 g/dL (ref 32.0–36.0)
MCV: 98.8 fL (ref 80.0–100.0)
PLATELETS: 227 10*3/uL (ref 150–440)
RBC: 4.16 MIL/uL (ref 3.80–5.20)
RDW: 14.4 % (ref 11.5–14.5)
WBC: 13 10*3/uL — AB (ref 3.6–11.0)

## 2018-03-18 MED ORDER — SODIUM CHLORIDE 0.9 % IV BOLUS
500.0000 mL | Freq: Once | INTRAVENOUS | Status: AC
  Administered 2018-03-18: 500 mL via INTRAVENOUS

## 2018-03-18 MED ORDER — AMOXICILLIN-POT CLAVULANATE 875-125 MG PO TABS
1.0000 | ORAL_TABLET | Freq: Once | ORAL | Status: AC
  Administered 2018-03-18: 1 via ORAL
  Filled 2018-03-18: qty 1

## 2018-03-18 MED ORDER — IOPAMIDOL (ISOVUE-300) INJECTION 61%
30.0000 mL | Freq: Once | INTRAVENOUS | Status: AC | PRN
  Administered 2018-03-18: 30 mL via ORAL

## 2018-03-18 MED ORDER — IOPAMIDOL (ISOVUE-300) INJECTION 61%
100.0000 mL | Freq: Once | INTRAVENOUS | Status: AC | PRN
  Administered 2018-03-18: 100 mL via INTRAVENOUS

## 2018-03-18 MED ORDER — ONDANSETRON HCL 4 MG/2ML IJ SOLN
4.0000 mg | Freq: Once | INTRAMUSCULAR | Status: AC
  Administered 2018-03-18: 4 mg via INTRAVENOUS
  Filled 2018-03-18: qty 2

## 2018-03-18 MED ORDER — ONDANSETRON 4 MG PO TBDP: 4.0000 mg | ORAL_TABLET | Freq: Four times a day (QID) | ORAL | 0 refills | Status: DC | PRN

## 2018-03-18 MED ORDER — AMOXICILLIN-POT CLAVULANATE 875-125 MG PO TABS: 1.0000 | ORAL_TABLET | Freq: Two times a day (BID) | ORAL | 0 refills | Status: AC

## 2018-03-18 NOTE — ED Notes (Signed)
Pt attempted to collect urine sample and missed the cup - advised pt the next time she had to void that I would place hat in the toilet to collect sample

## 2018-03-18 NOTE — Discharge Instructions (Signed)
You were seen in the emergency room for abdominal pain. It is important that you follow up closely with your primary care doctor in the next couple of days. ° ° °Please return to the emergency room right away if you are to develop a fever, severe nausea, your pain becomes severe or worsens, you are unable to keep food down, begin vomiting any dark or bloody fluid, you develop any dark or bloody stools, feel dehydrated, or other new concerns or symptoms arise. ° ° °

## 2018-03-18 NOTE — ED Notes (Signed)
Patient transported to CT 

## 2018-03-18 NOTE — ED Notes (Signed)
Pt does not have to void at this time.

## 2018-03-18 NOTE — ED Notes (Signed)
Pt refuses to keep monitoring equipment in place - she removes the BP cuff and pulse ox and gets out of bed unassisted  CT notified that pt had finished with contrast

## 2018-03-18 NOTE — ED Provider Notes (Signed)
[facility] Emergency Department Provider Note   ____________________________________________   First MD Initiated Contact with Patient 03/18/18 1219     (approximate)  I have reviewed the triage vital signs and the nursing notes.   HISTORY  Chief Complaint Abdominal Pain    HPI Victoria Gutierrez is a 72 y.o. female previous history of hypertension stroke  Patient presents today, this morning she was feeling well until 7 AM when she began experiencing nausea vomiting and left lower abdominal pain.  The abdominal pain was severe and lasted until about noon, went away in the car while she was driving here.  She denies any further nausea or vomiting but did vomit about 4-5 times at home, some of which was mucousy and slightly green  No diarrhea.  No fevers or chills.  No chest pain or trouble breathing.  Reports all of her pain is gone and her nausea seems to be improved as well.  Has never had similar symptoms.  No sick contacts.  Past Medical History:  Diagnosis Date  . Hypertension   . Stroke [facility])     Patient Active Problem List   Diagnosis Date Noted  . Localized osteoarthritis of hands, bilateral 09/23/2016  . Multiple joint pain 08/25/2016  . Psoriasis, unspecified 08/25/2016  . Cerebral vascular disease 04/16/2014  . Hyperlipidemia 04/16/2014  . Hypertension 04/16/2014  . 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 ABDOMINAL HYSTERECTOMY      Prior to Admission medications   Medication Sig Start Date End Date Taking? Authorizing Provider  citalopram (CELEXA) 20 MG tablet Take 20 mg by mouth daily.   Yes [provider]  clopidogrel (PLAVIX) 75 MG tablet TAKE 1 TABLET EVERY DAY 11/11/16  Yes [provider]  folic acid (FOLVITE) 1 MG tablet Take 1 tablet by mouth daily. ON NON-METHOTREXATE DAYS 01/09/18  Yes [provider]  hydrochlorothiazide (MICROZIDE) 12.5 MG  capsule Take 12.5 mg by mouth daily.   Yes [provider]  lisinopril (PRINIVIL,ZESTRIL) 20 MG tablet TAKE 1 TABLET EVERY DAY 11/11/16  Yes [provider]  Melatonin 5 MG TBDP Take 5 mg by mouth at bedtime.  05/12/16  Yes [provider]  methotrexate (RHEUMATREX) 2.5 MG tablet Take 15 mg by mouth once a week. 02/22/18  Yes [provider]  Potassium 99 MG TABS Take 0.5 tablets by mouth daily.   Yes [provider]  pregabalin (LYRICA) 100 MG capsule Take 1 capsule (100 mg total) by mouth 3 (three) times daily. 07/21/17  Yes Gillis Santa, MD  traMADol (ULTRAM) 50 MG tablet Take 50 mg by mouth every 12 (twelve) hours as needed. 05/11/17  Yes [provider]  amoxicillin-clavulanate (AUGMENTIN) 875-125 MG tablet Take 1 tablet by mouth 2 (two) times daily for 7 days. 03/18/18 03/25/18  Delman Kitten, MD  ondansetron (ZOFRAN ODT) 4 MG disintegrating tablet Take 1 tablet (4 mg total) by mouth every 6 (six) hours as needed for nausea or vomiting. 03/18/18   Delman Kitten, MD    Allergies Patient has no known allergies.  Family History  Problem Relation Age of Onset  . Heart disease Mother   . Heart disease Father     Social History Social History   Tobacco Use  . Smoking status: Current Every Day Smoker    Packs/day: 0.50    Years: 30.00    Pack years: 15.00    Types: Cigarettes  Start date: 03/10/1987  . Smokeless tobacco: Never Used  Substance Use Topics  . Alcohol use: No  . Drug use: No    Review of Systems Constitutional: No fever/chills Eyes: No visual changes. ENT: No sore throat. Cardiovascular: Denies chest pain. Respiratory: Denies shortness of breath. Gastrointestinal: Pain was on the left lower abdomen now fully relieved.  No diarrhea.  No constipation. Genitourinary: Negative for dysuria. Musculoskeletal: Negative for back pain. Skin: Negative for rash. Neurological: Negative for headaches, focal weakness or  numbness.    ____________________________________________   PHYSICAL EXAM:  VITAL SIGNS: ED Triage Vitals  Enc Vitals Group     BP 03/18/18 1218 (!) 157/95     Pulse Rate 03/18/18 1219 78     Resp 03/18/18 1219 15     Temp 03/18/18 1219 98.6 F (37 C)     Temp Source 03/18/18 1219 Oral     SpO2 03/18/18 1219 98 %     Weight 03/18/18 1204 172 lb (78 kg)     Height 03/18/18 1204 5\' 6"  (1.676 m)     Head Circumference --      Peak Flow --      Pain Score 03/18/18 1202 1     Pain Loc --      Pain Edu? --      Excl. in [place]? --     Constitutional: Alert and oriented. Well appearing and in no acute distress. Eyes: Conjunctivae are normal. Head: Atraumatic. Nose: No congestion/rhinnorhea. Mouth/Throat: Mucous membranes are moist. Neck: No stridor.   Cardiovascular: Normal rate, regular rhythm. Grossly normal heart sounds.  Good peripheral circulation. Respiratory: Normal respiratory effort.  No retractions. Lungs CTAB. Gastrointestinal: Soft and nontender except for moderate discomfort in the left lower quadrant only.  No rebound or guarding. No distention.  Patient reports not pain except when pressing the left lower abdomen she reports that he is still "sore".  No rebound or guarding. Musculoskeletal: No lower extremity tenderness nor edema. Neurologic:  Normal speech and language. No gross focal neurologic deficits are appreciated.  Skin:  Skin is warm, dry and intact. No rash noted. Psychiatric: Mood and affect are normal. Speech and behavior are normal.  ____________________________________________   LABS (all labs ordered are listed, but only abnormal results are displayed)  Labs Reviewed  COMPREHENSIVE METABOLIC PANEL - Abnormal; Notable for the following components:      Result Value   Glucose, Bld 143 (*)    GFR calc non Af Amer 58 (*)    All other components within normal limits  CBC - Abnormal; Notable for the following components:   WBC 13.0 (*)    MCH 34.3  (*)    All other components within normal limits  URINALYSIS, COMPLETE (UACMP) WITH MICROSCOPIC - Abnormal; Notable for the following components:   Color, Urine YELLOW (*)    APPearance CLOUDY (*)    Hgb urine dipstick LARGE (*)    Protein, ur 30 (*)    RBC / HPF >50 (*)    All other components within normal limits  LIPASE, BLOOD   ____________________________________________    Ct Abdomen Pelvis W Contrast  CT abdomen pelvis reviewed by me    IMPRESSION: 1. No acute findings in the abdomen or pelvis. No findings to explain the patient's history of left lower quadrant pain. 2. Left colonic diverticulosis without diverticulitis. Electronically Signed   By: Misty Stanley M.D.   On: 03/18/2018 15:31   ____________________________________________   PROCEDURES  Procedure(s) performed:  None  Procedures  Critical Care performed: No  ____________________________________________   INITIAL IMPRESSION / ASSESSMENT AND PLAN / ED COURSE  Pertinent labs & imaging results that were available during my care of the patient were reviewed by me and considered in my medical decision making (see chart for details).  Differential diagnosis includes but is not limited to, abdominal perforation, aortic dissection, cholecystitis, appendicitis, diverticulitis, colitis, esophagitis/gastritis, kidney stone, pyelonephritis, urinary tract infection, aortic aneurysm. All are considered in decision and treatment plan. Based upon the patient's presentation and risk factors, proceed with CT scan given the focality of left lower quadrant discomfort to examination elevated level/risk for diverticulitis is considered in addition to other etiologies including but not limited to infection, renal disease, renal colic, arterial or vascular etiology.  No neurologic cardiac or pulmonary symptoms.  ----------------------------------------- 3:50 PM on 03/18/2018 -----------------------------------------  Patient  currently pain-free.  Tolerated drinking her contrast well without any further nausea or emesis.  Reports she feels well at the present time.  Still slightly tender in the left lower quadrant, and given this though her CT is negative sensitivity for diverticulitis is not 100% today slightly suspicious this could be early diverticulitis given her diverticulosis.  Discussed with her and her daughter, we will treat her with Augmentin, close follow-up with her primary doctor and discuss careful return precautions for which she and family are agreeable.  She will also follow-up with her primary doctor for repeat urinalysis as we did discuss that there is blood in her urine and she should have a repeat test done with her primary doctor.  Patient agreeable       ____________________________________________   FINAL CLINICAL IMPRESSION(S) / ED DIAGNOSES  Final diagnoses:  Diverticulosis  Hematuria, unspecified type  Mild diverticulitis suspected    NEW MEDICATIONS STARTED DURING THIS VISIT:  New Prescriptions   AMOXICILLIN-CLAVULANATE (AUGMENTIN) 875-125 MG TABLET    Take 1 tablet by mouth 2 (two) times daily for 7 days.   ONDANSETRON (ZOFRAN ODT) 4 MG DISINTEGRATING TABLET    Take 1 tablet (4 mg total) by mouth every 6 (six) hours as needed for nausea or vomiting.     Note:  This document was prepared using Dragon voice recognition software and may include unintentional dictation errors.     Delman Kitten, MD 03/18/18 [phone]

## 2018-03-18 NOTE — ED Triage Notes (Addendum)
Pt arrived via POV with daughter with reports of LLQ abd pain that started this morning when she was trying to go to the bathroom.  Pt reports she has had some discomfort with urination, denies any diarrhea or hx of any GI problems. Pt states the pain is tolerable right now but was a 9/10 on the way to ED. Pt also states she vomited this morning as well from the pain.

## 2018-03-23 DIAGNOSIS — K5792 Diverticulitis of intestine, part unspecified, without perforation or abscess without bleeding: Secondary | ICD-10-CM | POA: Diagnosis not present

## 2018-03-23 DIAGNOSIS — Z780 Asymptomatic menopausal state: Secondary | ICD-10-CM | POA: Diagnosis not present

## 2018-04-02 ENCOUNTER — Ambulatory Visit: Payer: Medicare HMO

## 2018-04-04 DIAGNOSIS — D235 Other benign neoplasm of skin of trunk: Secondary | ICD-10-CM | POA: Diagnosis not present

## 2018-04-04 DIAGNOSIS — L989 Disorder of the skin and subcutaneous tissue, unspecified: Secondary | ICD-10-CM | POA: Diagnosis not present

## 2018-04-05 DIAGNOSIS — M8588 Other specified disorders of bone density and structure, other site: Secondary | ICD-10-CM | POA: Diagnosis not present

## 2018-04-06 ENCOUNTER — Other Ambulatory Visit: Payer: Self-pay | Admitting: Family Medicine

## 2018-04-06 DIAGNOSIS — Z1231 Encounter for screening mammogram for malignant neoplasm of breast: Secondary | ICD-10-CM

## 2018-04-24 ENCOUNTER — Ambulatory Visit
Admission: RE | Admit: 2018-04-24 | Discharge: 2018-04-24 | Disposition: A | Discharge status: Home / Self Care | Payer: Medicare HMO | Source: Ambulatory Visit | Attending: Anesthesiology | Admitting: Anesthesiology

## 2018-04-24 DIAGNOSIS — M48061 Spinal stenosis, lumbar region without neurogenic claudication: Secondary | ICD-10-CM | POA: Insufficient documentation

## 2018-04-24 DIAGNOSIS — M545 Low back pain: Secondary | ICD-10-CM | POA: Diagnosis not present

## 2018-04-24 DIAGNOSIS — M5126 Other intervertebral disc displacement, lumbar region: Secondary | ICD-10-CM | POA: Diagnosis not present

## 2018-04-25 DIAGNOSIS — H2513 Age-related nuclear cataract, bilateral: Secondary | ICD-10-CM | POA: Diagnosis not present

## 2018-04-26 ENCOUNTER — Ambulatory Visit
Admission: RE | Admit: 2018-04-26 | Discharge: 2018-04-26 | Disposition: A | Discharge status: Home / Self Care | Payer: Medicare HMO | Source: Ambulatory Visit | Attending: Family Medicine | Admitting: Family Medicine

## 2018-04-26 DIAGNOSIS — Z1231 Encounter for screening mammogram for malignant neoplasm of breast: Secondary | ICD-10-CM

## 2018-05-30 ENCOUNTER — Ambulatory Visit: Admit: 2018-05-30 | Discharge: 2018-05-31 | Payer: MEDICARE

## 2018-05-30 DIAGNOSIS — Z79899 Other long term (current) drug therapy: Secondary | ICD-10-CM

## 2018-05-30 DIAGNOSIS — L409 Psoriasis, unspecified: Secondary | ICD-10-CM | POA: Diagnosis not present

## 2018-05-30 MED ORDER — FOLIC ACID 1 MG TABLET
ORAL_TABLET | 3 refills | 0 days | Status: CP
Start: 2018-05-30 — End: 2019-01-16

## 2018-05-30 MED ORDER — METHOTREXATE SODIUM 2.5 MG TABLET
ORAL_TABLET | 3 refills | 0 days | Status: CP
Start: 2018-05-30 — End: 2019-01-19

## 2018-06-12 DIAGNOSIS — I10 Essential (primary) hypertension: Secondary | ICD-10-CM | POA: Diagnosis not present

## 2018-06-12 DIAGNOSIS — M545 Low back pain: Secondary | ICD-10-CM | POA: Diagnosis not present

## 2018-06-12 DIAGNOSIS — Z23 Encounter for immunization: Secondary | ICD-10-CM | POA: Diagnosis not present

## 2018-06-12 DIAGNOSIS — Z0000 Encounter for general adult medical examination without abnormal findings: Secondary | ICD-10-CM | POA: Diagnosis not present

## 2018-06-12 DIAGNOSIS — M810 Age-related osteoporosis without current pathological fracture: Secondary | ICD-10-CM | POA: Diagnosis not present

## 2018-06-12 DIAGNOSIS — R300 Dysuria: Secondary | ICD-10-CM | POA: Diagnosis not present

## 2018-07-06 DIAGNOSIS — M5126 Other intervertebral disc displacement, lumbar region: Secondary | ICD-10-CM | POA: Diagnosis not present

## 2018-08-11 DIAGNOSIS — I10 Essential (primary) hypertension: Secondary | ICD-10-CM | POA: Diagnosis not present

## 2018-08-11 DIAGNOSIS — Z6827 Body mass index (BMI) 27.0-27.9, adult: Secondary | ICD-10-CM | POA: Diagnosis not present

## 2018-08-11 DIAGNOSIS — M5126 Other intervertebral disc displacement, lumbar region: Secondary | ICD-10-CM | POA: Diagnosis not present

## 2018-12-27 DIAGNOSIS — I639 Cerebral infarction, unspecified: Secondary | ICD-10-CM

## 2018-12-27 HISTORY — DX: Cerebral infarction, unspecified: I63.9

## 2019-01-17 ENCOUNTER — Ambulatory Visit: Admit: 2019-01-17 | Discharge: 2019-01-18 | Payer: MEDICARE

## 2019-01-17 DIAGNOSIS — Z79899 Other long term (current) drug therapy: Secondary | ICD-10-CM

## 2019-01-17 DIAGNOSIS — L409 Psoriasis, unspecified: Secondary | ICD-10-CM | POA: Diagnosis not present

## 2019-01-17 MED ORDER — CLOBETASOL 0.05 % SCALP SOLUTION
5 refills | 0 days | Status: CP
Start: 2019-01-17 — End: ?

## 2019-01-17 MED ORDER — FOLIC ACID 1 MG TABLET
ORAL_TABLET | 3 refills | 0 days | Status: CP
Start: 2019-01-17 — End: 2019-01-19

## 2019-01-19 MED ORDER — FOLIC ACID 1 MG TABLET
ORAL_TABLET | 3 refills | 0 days | Status: CP
Start: 2019-01-19 — End: ?

## 2019-01-19 MED ORDER — METHOTREXATE SODIUM 2.5 MG TABLET
ORAL_TABLET | 3 refills | 0 days | Status: CP
Start: 2019-01-19 — End: ?

## 2019-01-20 ENCOUNTER — Emergency Department (HOSPITAL_COMMUNITY): Payer: Medicare HMO

## 2019-01-20 ENCOUNTER — Other Ambulatory Visit: Payer: Self-pay

## 2019-01-20 ENCOUNTER — Inpatient Hospital Stay (HOSPITAL_COMMUNITY)
Admission: EM | Admit: 2019-01-20 | Discharge: 2019-01-22 | DRG: 065 | Disposition: A | Discharge status: Home / Self Care | Payer: Medicare HMO | Attending: Family Medicine | Admitting: Family Medicine

## 2019-01-20 ENCOUNTER — Observation Stay (HOSPITAL_COMMUNITY): Payer: Medicare HMO

## 2019-01-20 DIAGNOSIS — G459 Transient cerebral ischemic attack, unspecified: Secondary | ICD-10-CM | POA: Diagnosis not present

## 2019-01-20 DIAGNOSIS — E785 Hyperlipidemia, unspecified: Secondary | ICD-10-CM | POA: Diagnosis not present

## 2019-01-20 DIAGNOSIS — I4510 Unspecified right bundle-branch block: Secondary | ICD-10-CM | POA: Diagnosis not present

## 2019-01-20 DIAGNOSIS — R29818 Other symptoms and signs involving the nervous system: Secondary | ICD-10-CM | POA: Diagnosis not present

## 2019-01-20 DIAGNOSIS — F17210 Nicotine dependence, cigarettes, uncomplicated: Secondary | ICD-10-CM | POA: Diagnosis not present

## 2019-01-20 DIAGNOSIS — Z8673 Personal history of transient ischemic attack (TIA), and cerebral infarction without residual deficits: Secondary | ICD-10-CM | POA: Diagnosis not present

## 2019-01-20 DIAGNOSIS — G8101 Flaccid hemiplegia affecting right dominant side: Secondary | ICD-10-CM | POA: Diagnosis not present

## 2019-01-20 DIAGNOSIS — Z8249 Family history of ischemic heart disease and other diseases of the circulatory system: Secondary | ICD-10-CM | POA: Diagnosis not present

## 2019-01-20 DIAGNOSIS — L409 Psoriasis, unspecified: Secondary | ICD-10-CM | POA: Diagnosis not present

## 2019-01-20 DIAGNOSIS — I6501 Occlusion and stenosis of right vertebral artery: Secondary | ICD-10-CM | POA: Diagnosis not present

## 2019-01-20 DIAGNOSIS — E876 Hypokalemia: Secondary | ICD-10-CM | POA: Diagnosis present

## 2019-01-20 DIAGNOSIS — R29810 Facial weakness: Secondary | ICD-10-CM | POA: Diagnosis not present

## 2019-01-20 DIAGNOSIS — I63322 Cerebral infarction due to thrombosis of left anterior cerebral artery: Secondary | ICD-10-CM | POA: Diagnosis not present

## 2019-01-20 DIAGNOSIS — I679 Cerebrovascular disease, unspecified: Secondary | ICD-10-CM | POA: Diagnosis not present

## 2019-01-20 DIAGNOSIS — Z20828 Contact with and (suspected) exposure to other viral communicable diseases: Secondary | ICD-10-CM | POA: Diagnosis present

## 2019-01-20 DIAGNOSIS — I10 Essential (primary) hypertension: Secondary | ICD-10-CM | POA: Diagnosis present

## 2019-01-20 DIAGNOSIS — R531 Weakness: Secondary | ICD-10-CM | POA: Diagnosis not present

## 2019-01-20 DIAGNOSIS — I639 Cerebral infarction, unspecified: Secondary | ICD-10-CM | POA: Diagnosis not present

## 2019-01-20 DIAGNOSIS — I6522 Occlusion and stenosis of left carotid artery: Secondary | ICD-10-CM | POA: Diagnosis not present

## 2019-01-20 DIAGNOSIS — Z7902 Long term (current) use of antithrombotics/antiplatelets: Secondary | ICD-10-CM

## 2019-01-20 LAB — URINALYSIS, ROUTINE W REFLEX MICROSCOPIC
Bilirubin Urine: NEGATIVE
Glucose, UA: NEGATIVE mg/dL
Ketones, ur: NEGATIVE mg/dL
Leukocytes,Ua: NEGATIVE
Nitrite: NEGATIVE
Protein, ur: NEGATIVE mg/dL
Specific Gravity, Urine: 1.032 — ABNORMAL HIGH (ref 1.005–1.030)
pH: 8 (ref 5.0–8.0)

## 2019-01-20 LAB — I-STAT CHEM 8, ED
BUN: 15 mg/dL (ref 8–23)
Calcium, Ion: 1.03 mmol/L — ABNORMAL LOW (ref 1.15–1.40)
Chloride: 109 mmol/L (ref 98–111)
Creatinine, Ser: 1.2 mg/dL — ABNORMAL HIGH (ref 0.44–1.00)
Glucose, Bld: 143 mg/dL — ABNORMAL HIGH (ref 70–99)
HCT: 42 % (ref 36.0–46.0)
Hemoglobin: 14.3 g/dL (ref 12.0–15.0)
Potassium: 3.3 mmol/L — ABNORMAL LOW (ref 3.5–5.1)
Sodium: 141 mmol/L (ref 135–145)
TCO2: 26 mmol/L (ref 22–32)

## 2019-01-20 LAB — RAPID URINE DRUG SCREEN, HOSP PERFORMED
Amphetamines: NOT DETECTED
Barbiturates: NOT DETECTED
Benzodiazepines: NOT DETECTED
Cocaine: NOT DETECTED
Opiates: NOT DETECTED
Tetrahydrocannabinol: POSITIVE — AB

## 2019-01-20 LAB — ETHANOL: Alcohol, Ethyl (B): <10 mg/dL (ref <10)

## 2019-01-20 LAB — COMPREHENSIVE METABOLIC PANEL
ALT: 17 U/L (ref 0–44)
AST: 19 U/L (ref 15–41)
Albumin: 3.4 g/dL — ABNORMAL LOW (ref 3.5–5.0)
Alkaline Phosphatase: 74 U/L (ref 38–126)
Anion gap: 14 (ref 5–15)
BUN: 14 mg/dL (ref 8–23)
CO2: 18 mmol/L — ABNORMAL LOW (ref 22–32)
Calcium: 8.9 mg/dL (ref 8.9–10.3)
Chloride: 110 mmol/L (ref 98–111)
Creatinine, Ser: 1.25 mg/dL — ABNORMAL HIGH (ref 0.44–1.00)
GFR calc Af Amer: 50 mL/min — ABNORMAL LOW (ref >60)
GFR calc non Af Amer: 43 mL/min — ABNORMAL LOW (ref >60)
Glucose, Bld: 143 mg/dL — ABNORMAL HIGH (ref 70–99)
Potassium: 3.6 mmol/L (ref 3.5–5.1)
Sodium: 142 mmol/L (ref 135–145)
Total Bilirubin: 0.6 mg/dL (ref 0.3–1.2)
Total Protein: 5.4 g/dL — ABNORMAL LOW (ref 6.5–8.1)

## 2019-01-20 LAB — APTT: aPTT: 28 seconds (ref 24–36)

## 2019-01-20 LAB — CBC
HCT: 44.6 % (ref 36.0–46.0)
Hemoglobin: 15 g/dL (ref 12.0–15.0)
MCH: 34.4 pg — ABNORMAL HIGH (ref 26.0–34.0)
MCHC: 33.6 g/dL (ref 30.0–36.0)
MCV: 102.3 fL — ABNORMAL HIGH (ref 80.0–100.0)
Platelets: 223 10*3/uL (ref 150–400)
RBC: 4.36 MIL/uL (ref 3.87–5.11)
RDW: 12.2 % (ref 11.5–15.5)
WBC: 11.2 10*3/uL — ABNORMAL HIGH (ref 4.0–10.5)
nRBC: 0 % (ref 0.0–0.2)

## 2019-01-20 LAB — SARS CORONAVIRUS 2 BY RT PCR (HOSPITAL ORDER, PERFORMED IN ~~LOC~~ HOSPITAL LAB): SARS Coronavirus 2: NEGATIVE

## 2019-01-20 LAB — DIFFERENTIAL
Abs Immature Granulocytes: 0.04 10*3/uL (ref 0.00–0.07)
Basophils Absolute: 0.1 10*3/uL (ref 0.0–0.1)
Basophils Relative: 1 %
Eosinophils Absolute: 0.5 10*3/uL (ref 0.0–0.5)
Eosinophils Relative: 5 %
Immature Granulocytes: 0 %
Lymphocytes Relative: 22 %
Lymphs Abs: 2.5 10*3/uL (ref 0.7–4.0)
Monocytes Absolute: 0.8 10*3/uL (ref 0.1–1.0)
Monocytes Relative: 8 %
Neutro Abs: 7.2 10*3/uL (ref 1.7–7.7)
Neutrophils Relative %: 64 %

## 2019-01-20 LAB — PROTIME-INR
INR: 1 (ref 0.8–1.2)
Prothrombin Time: 13 seconds (ref 11.4–15.2)

## 2019-01-20 MED ORDER — ASPIRIN 325 MG PO TABS: 325.0000 mg | ORAL_TABLET | Freq: Once | ORAL | Status: DC

## 2019-01-20 MED ORDER — ASPIRIN 325 MG PO TABS: 325.0000 mg | ORAL_TABLET | Freq: Every day | ORAL | Status: DC

## 2019-01-20 MED ORDER — ACETAMINOPHEN 650 MG RE SUPP: 650.0000 mg | RECTAL | Status: DC | PRN

## 2019-01-20 MED ORDER — ASPIRIN 300 MG RE SUPP: 300.0000 mg | Freq: Every day | RECTAL | Status: DC

## 2019-01-20 MED ORDER — ACETAMINOPHEN 325 MG PO TABS: 650.0000 mg | ORAL_TABLET | ORAL | Status: DC | PRN

## 2019-01-20 MED ORDER — IOHEXOL 350 MG/ML SOLN
75.0000 mL | Freq: Once | INTRAVENOUS | Status: AC | PRN
  Administered 2019-01-20: 75 mL via INTRAVENOUS

## 2019-01-20 MED ORDER — CLOPIDOGREL BISULFATE 75 MG PO TABS
75.0000 mg | ORAL_TABLET | Freq: Every day | ORAL | Status: DC
  Administered 2019-01-21 – 2019-01-22 (×2): 75 mg via ORAL
  Filled 2019-01-20 (×2): qty 1

## 2019-01-20 MED ORDER — STROKE: EARLY STAGES OF RECOVERY BOOK
Freq: Once | Status: AC
  Administered 2019-01-21: 02:00:00
  Filled 2019-01-20: qty 1

## 2019-01-20 MED ORDER — ACETAMINOPHEN 160 MG/5ML PO SOLN: 650.0000 mg | ORAL | Status: DC | PRN

## 2019-01-20 MED ORDER — ENOXAPARIN SODIUM 40 MG/0.4ML ~~LOC~~ SOLN
40.0000 mg | SUBCUTANEOUS | Status: DC
  Administered 2019-01-21 – 2019-01-22 (×2): 40 mg via SUBCUTANEOUS
  Filled 2019-01-20 (×2): qty 0.4

## 2019-01-20 NOTE — Code Documentation (Signed)
Responded to code stroke called at 1942, pt arrived at 2009 with right sided weakness. CBG 143, NIH 3, LSN 6384, CT negative for acute changes. Repeat NIH 2 and pt able to walk to stretcher, TPA not given, "too good to treat." PT TPA window ends at 2245.

## 2019-01-20 NOTE — Consult Note (Signed)
Neurology Consultation Reason for Consult: Right-sided weakness Referring Physician: Wilson Singer, S  CC: Right-sided weakness  History is obtained from: Patient, daughter  HPI: Victoria Gutierrez is a 73 y.o. female with a history of previous TIA " 4 to 5 years ago" who presents with right-sided weakness that started abruptly around 6:15 PM.  She had been outside at the pool for about 5 hours and got out of the pool to eat some ice cream, and was initially normal, but then found that she was dropping her spoon.  She slumped over to the right, and her daughter tried to get her to sit up and was having trouble.  She noted that she was completely flaccid on the right, and therefore 911 was called.  En route, the patient's symptoms began to improve and by the time of arrival, her symptoms are relatively mild.  CTA revealed left MCA occlusion, on review of previous imaging there is an MRI from 2018 which reveals this as chronic.    LKW: 6:15 PM tpa given?: no, rapidly improving mild symptoms Premorbid modified rankin scale: 1    ROS: A 14 point ROS was performed and is negative except as noted in the HPI.   Past Medical History:  Diagnosis Date  . Hypertension   . Stroke [facility])      Family History  Problem Relation Age of Onset  . Heart disease Mother   . Heart disease Father   . Breast cancer Neg Hx      Social History:  reports that she has been smoking cigarettes. She started smoking about 31 years ago. She has a 15.00 pack-year smoking history. She has never used smokeless tobacco. She reports that she does not drink alcohol or use drugs.   Exam: Current vital signs: BP (!) 185/101   Pulse 84   Temp 98.4 F (36.9 C) (Oral)   Resp (!) 22   Wt 79.9 kg   SpO2 96%   BMI 28.43 kg/m  Vital signs in last 24 hours: Temp:  [98.4 F (36.9 C)] 98.4 F (36.9 C) (07/25 2048) Pulse Rate:  [84-85] 84 (07/25 2200) Resp:  [15-24] 22 (07/25 2200) BP: (185-218)/(101-128) 185/101 (07/25  2200) SpO2:  [96 %-100 %] 96 % (07/25 2200) Weight:  [79.9 kg] 79.9 kg (07/25 2048)   Physical Exam  Constitutional: Appears well-developed and well-nourished.  Psych: Affect appropriate to situation Eyes: No scleral injection HENT: No OP obstrucion Head: Normocephalic.  Cardiovascular: Normal rate and regular rhythm.  Respiratory: Effort normal, non-labored breathing GI: Soft.  No distension. There is no tenderness.  Skin: WDI  Neuro: Mental Status: Patient is awake, alert, oriented to person, place, month, year, and situation. Patient is able to give a clear and coherent history. No signs of aphasia or neglect Cranial Nerves: II: Visual Fields are full. Pupils are equal, round, and reactive to light.   III,IV, VI: EOMI without ptosis or diploplia.  V: Facial sensation is symmetric to temperature VII: Facial movement is symmetric.  VIII: hearing is intact to voice X: Uvula elevates symmetrically XI: Shoulder shrug is symmetric. XII: tongue is midline without atrophy or fasciculations.  Motor: Tone is normal. Bulk is normal. 5/5 strength was present in bilateral upper extremities, and left leg, and the right leg she has 4+/5 strength though by the end she does not drift. Sensory: Sensation is symmetric to light touch and temperature in the arms and legs. Cerebellar: FNF intact bilaterally, condition intact on the left, impaired consistent with  weakness in the right   I have reviewed labs in epic and the results pertinent to this consultation are: Creatinine 1.25  I have reviewed the images obtained: CT/CTA- chronic appearing occlusion of the left  Impression: 73 year old female with rapidly improving right-sided weakness.  Given that she has mild deficits and is able to stand and walk, I would not recommend IV TPA at this time due to mild symptoms.  I discussed this with the patient who expressed understanding.  Though she does have an MCA occlusion, based on previous  imaging I suspect that this is chronic, and her symptoms are mild enough that I would not favor intervention at this time.  Recommendations: - HgbA1c, fasting lipid panel - MRI  of the brain without contrast - Frequent neuro checks - Echocardiogram - Prophylactic therapy-Antiplatelet med: Aspirin -81 mg and Plavix 75 mg for 3 weeks - Risk factor modification - Telemetry monitoring - PT consult, OT consult, Speech consult - Stroke team to follow    Roland Rack, MD [facility] [phone]  If 7pm- 7am, please page neurology on call as listed in [place].

## 2019-01-20 NOTE — H&P (Signed)
History and Physical    [name]:[id number] DOB: [birth year] DOA: 01/20/2019  PCP: Maryland Pink, MD  Patient coming from: Home  I have personally briefly reviewed patient's old medical records in [place]  Chief Complaint: R sided weakness  HPI: Victoria Gutierrez is a 73 y.o. female with medical history significant of HTN, stroke, CVD.  Patient presents to ED with c/o RUE and RLE weakness.  Onset 1845, sudden.  Presents to ED.   ED Course: Symptoms gradually improving while in ED.  Now good strength RUE.  Still some RLE weakness however.  No associated speech deficits, no headache, no CP.  Very hypertensive in ED with SBP initially 210s, now 185.  CTA head and neck show chronic M1 occlusion with collaterals.   Review of Systems: As per HPI, otherwise all review of systems negative.  Past Medical History:  Diagnosis Date   Hypertension    Stroke [facility])     Past Surgical History:  Procedure Laterality Date   ABDOMINAL HYSTERECTOMY       reports that she has been smoking cigarettes. She started smoking about 31 years ago. She has a 15.00 pack-year smoking history. She has never used smokeless tobacco. She reports that she does not drink alcohol or use drugs.  No Known Allergies  Family History  Problem Relation Age of Onset   Heart disease Mother    Heart disease Father    Breast cancer Neg Hx      Prior to Admission medications   Medication Sig Start Date End Date Taking? Authorizing Provider  citalopram (CELEXA) 20 MG tablet Take 20 mg by mouth daily.    [provider]  clopidogrel (PLAVIX) 75 MG tablet TAKE 1 TABLET EVERY DAY 11/11/16   [provider]  folic acid (FOLVITE) 1 MG tablet Take 1 tablet by mouth daily. ON NON-METHOTREXATE DAYS 01/09/18   [provider]  hydrochlorothiazide (MICROZIDE) 12.5 MG capsule Take 12.5 mg by mouth daily.    [provider]  lisinopril (PRINIVIL,ZESTRIL) 20 MG tablet  TAKE 1 TABLET EVERY DAY 11/11/16   [provider]  Melatonin 5 MG TBDP Take 5 mg by mouth at bedtime.  05/12/16   [provider]  methotrexate (RHEUMATREX) 2.5 MG tablet Take 15 mg by mouth once a week. 02/22/18   [provider]  ondansetron (ZOFRAN ODT) 4 MG disintegrating tablet Take 1 tablet (4 mg total) by mouth every 6 (six) hours as needed for nausea or vomiting. 03/18/18   Delman Kitten, MD  Potassium 99 MG TABS Take 0.5 tablets by mouth daily.    [provider]  pregabalin (LYRICA) 100 MG capsule Take 1 capsule (100 mg total) by mouth 3 (three) times daily. 07/21/17   Gillis Santa, MD  traMADol (ULTRAM) 50 MG tablet Take 50 mg by mouth every 12 (twelve) hours as needed. 05/11/17   [provider]    Physical Exam: Vitals:   01/20/19 2100 01/20/19 2130 01/20/19 2145 01/20/19 2200  BP: (!) 218/108 (!) 208/128  (!) 185/101  Pulse: 84   84  Resp: (!) 22 (!) 24 15 (!) 22  Temp:      TempSrc:      SpO2: 98%   96%  Weight:        Constitutional: NAD, calm, comfortable Eyes: PERRL, lids and conjunctivae normal ENMT: Mucous membranes are moist. Posterior pharynx clear of any exudate or lesions.Normal dentition.  Neck: normal, supple, no masses, no thyromegaly Respiratory:  clear to auscultation bilaterally, no wheezing, no crackles. Normal respiratory effort. No accessory muscle use.  Cardiovascular: Regular rate and rhythm, no murmurs / rubs / gallops. No extremity edema. 2+ pedal pulses. No carotid bruits.  Abdomen: no tenderness, no masses palpated. No hepatosplenomegaly. Bowel sounds positive.  Musculoskeletal: no clubbing / cyanosis. No joint deformity upper and lower extremities. Good ROM, no contractures. Normal muscle tone.  Skin: no rashes, lesions, ulcers. No induration Neurologic: RLE 4/5, 5/5 elsewhere, no facial droop, no aphasia Psychiatric: Normal judgment and insight. Alert and oriented x 3. Normal mood.    Labs on Admission:  I have personally reviewed following labs and imaging studies  CBC: Recent Labs  Lab 01/20/19 2015 01/20/19 2017  WBC 11.2*  --   NEUTROABS 7.2  --   HGB 15.0 14.3  HCT 44.6 42.0  MCV 102.3*  --   PLT 223  --    Basic Metabolic Panel: Recent Labs  Lab 01/20/19 2015 01/20/19 2017  NA 142 141  K 3.6 3.3*  CL 110 109  CO2 18*  --   GLUCOSE 143* 143*  BUN 14 15  CREATININE 1.25* 1.20*  CALCIUM 8.9  --    GFR: CrCl cannot be calculated (Unknown ideal weight.). Liver Function Tests: Recent Labs  Lab 01/20/19 2015  AST 19  ALT 17  ALKPHOS 74  BILITOT 0.6  PROT 5.4*  ALBUMIN 3.4*   No results for input(s): LIPASE, AMYLASE in the last 168 hours. No results for input(s): AMMONIA in the last 168 hours. Coagulation Profile: Recent Labs  Lab 01/20/19 2015  INR 1.0   Cardiac Enzymes: No results for input(s): CKTOTAL, CKMB, CKMBINDEX, TROPONINI in the last 168 hours. BNP (last 3 results) No results for input(s): PROBNP in the last 8760 hours. HbA1C: No results for input(s): HGBA1C in the last 72 hours. CBG: No results for input(s): GLUCAP in the last 168 hours. Lipid Profile: No results for input(s): CHOL, HDL, LDLCALC, TRIG, CHOLHDL, LDLDIRECT in the last 72 hours. Thyroid Function Tests: No results for input(s): TSH, T4TOTAL, FREET4, T3FREE, THYROIDAB in the last 72 hours. Anemia Panel: No results for input(s): VITAMINB12, FOLATE, FERRITIN, TIBC, IRON, RETICCTPCT in the last 72 hours. Urine analysis:    Component Value Date/Time   COLORURINE YELLOW 01/20/2019 2111   APPEARANCEUR HAZY (A) 01/20/2019 2111   APPEARANCEUR Hazy 08/11/2011 2253   LABSPEC 1.032 (H) 01/20/2019 2111   LABSPEC 1.011 08/11/2011 2253   PHURINE 8.0 01/20/2019 2111   GLUCOSEU NEGATIVE 01/20/2019 2111   GLUCOSEU Negative 08/11/2011 2253   HGBUR SMALL (A) 01/20/2019 2111   BILIRUBINUR NEGATIVE 01/20/2019 2111   BILIRUBINUR Negative 08/11/2011 2253   Ernest 01/20/2019 2111     PROTEINUR NEGATIVE 01/20/2019 2111   NITRITE NEGATIVE 01/20/2019 2111   LEUKOCYTESUR NEGATIVE 01/20/2019 2111   LEUKOCYTESUR Negative 08/11/2011 2253    Radiological Exams on Admission: Ct Angio Head W Or Wo Contrast  Result Date: 01/20/2019 CLINICAL DATA:  Code stroke.  Right-sided weakness. EXAM: CT ANGIOGRAPHY HEAD AND NECK TECHNIQUE: Multidetector CT imaging of the head and neck was performed using the standard protocol during bolus administration of intravenous contrast. Multiplanar CT image reconstructions and MIPs were obtained to evaluate the vascular anatomy. Carotid stenosis measurements (when applicable) are obtained utilizing NASCET criteria, using the distal internal carotid diameter as the denominator. CONTRAST:  51mL OMNIPAQUE IOHEXOL 350 MG/ML SOLN COMPARISON:  CT head without contrast 01/20/2019. MRI brain 04/08/2017 FINDINGS: CTA NECK FINDINGS Aortic arch: Atherosclerotic calcifications  are present at the origins the great vessels. There is no significant stenosis of greater than 50% relative to the more distal vessels. Calcifications are greatest at the subclavian artery. There is no aneurysm. Right carotid system: The right common carotid artery is within normal limits. Atherosclerotic calcifications are present along the medial aspect of the proximal right ICA. The minimal luminal diameter is 3 mm. The more distal right ICA is within normal limits. Left carotid system: The left common carotid artery is within normal limits. Dense calcifications are present without a significant stenosis relative to the more distal vessel. The more distal left ICA is normal. Vertebral arteries: Dense calcifications are present at the origin of the dominant right vertebral artery. There is a high-grade stenosis. There is no stenosis at the non dominant left vertebral artery origin. There is a moderate tandem stenosis at the entry into the spinal canal at C6 on the right. The more distal right vertebral  artery is within normal limits. Calcifications are present at C1 without significant stenosis. The left vertebral artery is hypoplastic without focal stenosis in the neck. Skeleton: Multilevel degenerative changes are present in the cervical spine. Endplate changes are most pronounced at C5-6 and C6-7 with osseous foraminal narrowing bilaterally. No focal lytic or blastic lesions are present. Advanced facet hypertrophy is present at C2-3 and C3-4. Other neck: No focal mucosal or submucosal lesions are present. A dominant right thyroid nodule measures 2.1 x 1.3 x 1.6 cm. No significant adenopathy is present. The salivary glands are within normal limits. Upper chest: Mild dependent atelectasis is present. Lung apices are otherwise clear without focal nodule, mass, or airspace disease. The upper mediastinum is clear. Review of the MIP images confirms the above findings CTA HEAD FINDINGS Anterior circulation: Dense atherosclerotic calcifications are present throughout the cavernous internal carotid arteries bilaterally without significant stenosis relative to the more distal vessel. ICA termini are within normal limits. Right M1 segment is normal. Left A1 segment is dominant. The anterior communicating artery is patent. There is chronic proximal left MCA occlusion with collateral flow to left MCA branches. Right MCA bifurcation is intact. There is some attenuation of distal right MCA and bilateral distal ACA branches without a significant proximal stenosis or occlusion. Posterior circulation: The left vertebral artery terminates at the PICA. Dense atherosclerotic calcifications are present at the vertebral artery origin on the right. There is at least 50% stenosis relative to the more distal vessel. The basilar artery is small with moderate distal stenosis. The basilar artery terminates at the superior cerebellar arteries with small P1 segments extending into fetal type posterior cerebral arteries bilaterally. There is  mild narrowing of the proximal P2 segments bilaterally without a significant stenosis or occlusion. There is some attenuation of distal PCA branch vessels bilaterally. Venous sinuses: The dural sinuses are patent. The right transverse sinus is dominant. The straight sinus and deep cerebral veins are intact. Cortical veins are within normal limits. Anatomic variants: Fetal type posterior cerebral arteries bilaterally. Review of the MIP images confirms the above findings IMPRESSION: 1. 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 2. 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 3. Atherosclerotic changes of the proximal internal carotid arteries bilaterally without significant stenosis relative to the more distal vessels. 4. 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 5. Tandem stenosis of the right vertebral artery at the C6 level. 6. Dominant right thyroid nodule. This was noted on previous nuclear medicine study. Consider further  evaluation with thyroid ultrasound. If patient is clinically hyperthyroid, consider nuclear medicine thyroid uptake and scan. 7. Atherosclerotic changes within the cavernous internal carotid arteries bilaterally without a significant stenosis. 8. Collateral flow to left MCA branches. 9. Hypoplastic basilar artery with distal narrowing. Fetal type posterior cerebral arteries are present. Electronically Signed   By: San Morelle M.D.   On: 01/20/2019 20:55   Ct Angio Neck W Or Wo Contrast  Result Date: 01/20/2019 CLINICAL DATA:  Code stroke.  Right-sided weakness. EXAM: CT ANGIOGRAPHY HEAD AND NECK TECHNIQUE: Multidetector CT imaging of the head and neck was performed using the standard protocol during bolus administration of intravenous contrast. Multiplanar CT image reconstructions and MIPs were obtained to evaluate the vascular anatomy. Carotid stenosis measurements (when applicable) are obtained utilizing NASCET  criteria, using the distal internal carotid diameter as the denominator. CONTRAST:  73mL OMNIPAQUE IOHEXOL 350 MG/ML SOLN COMPARISON:  CT head without contrast 01/20/2019. MRI brain 04/08/2017 FINDINGS: CTA NECK FINDINGS Aortic arch: Atherosclerotic calcifications are present at the origins the great vessels. There is no significant stenosis of greater than 50% relative to the more distal vessels. Calcifications are greatest at the subclavian artery. There is no aneurysm. Right carotid system: The right common carotid artery is within normal limits. Atherosclerotic calcifications are present along the medial aspect of the proximal right ICA. The minimal luminal diameter is 3 mm. The more distal right ICA is within normal limits. Left carotid system: The left common carotid artery is within normal limits. Dense calcifications are present without a significant stenosis relative to the more distal vessel. The more distal left ICA is normal. Vertebral arteries: Dense calcifications are present at the origin of the dominant right vertebral artery. There is a high-grade stenosis. There is no stenosis at the non dominant left vertebral artery origin. There is a moderate tandem stenosis at the entry into the spinal canal at C6 on the right. The more distal right vertebral artery is within normal limits. Calcifications are present at C1 without significant stenosis. The left vertebral artery is hypoplastic without focal stenosis in the neck. Skeleton: Multilevel degenerative changes are present in the cervical spine. Endplate changes are most pronounced at C5-6 and C6-7 with osseous foraminal narrowing bilaterally. No focal lytic or blastic lesions are present. Advanced facet hypertrophy is present at C2-3 and C3-4. Other neck: No focal mucosal or submucosal lesions are present. A dominant right thyroid nodule measures 2.1 x 1.3 x 1.6 cm. No significant adenopathy is present. The salivary glands are within normal limits.  Upper chest: Mild dependent atelectasis is present. Lung apices are otherwise clear without focal nodule, mass, or airspace disease. The upper mediastinum is clear. Review of the MIP images confirms the above findings CTA HEAD FINDINGS Anterior circulation: Dense atherosclerotic calcifications are present throughout the cavernous internal carotid arteries bilaterally without significant stenosis relative to the more distal vessel. ICA termini are within normal limits. Right M1 segment is normal. Left A1 segment is dominant. The anterior communicating artery is patent. There is chronic proximal left MCA occlusion with collateral flow to left MCA branches. Right MCA bifurcation is intact. There is some attenuation of distal right MCA and bilateral distal ACA branches without a significant proximal stenosis or occlusion. Posterior circulation: The left vertebral artery terminates at the PICA. Dense atherosclerotic calcifications are present at the vertebral artery origin on the right. There is at least 50% stenosis relative to the more distal vessel. The basilar artery is small with moderate distal stenosis.  The basilar artery terminates at the superior cerebellar arteries with small P1 segments extending into fetal type posterior cerebral arteries bilaterally. There is mild narrowing of the proximal P2 segments bilaterally without a significant stenosis or occlusion. There is some attenuation of distal PCA branch vessels bilaterally. Venous sinuses: The dural sinuses are patent. The right transverse sinus is dominant. The straight sinus and deep cerebral veins are intact. Cortical veins are within normal limits. Anatomic variants: Fetal type posterior cerebral arteries bilaterally. Review of the MIP images confirms the above findings IMPRESSION: 1. 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 2. 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 3. Atherosclerotic changes of the proximal internal  carotid arteries bilaterally without significant stenosis relative to the more distal vessels. 4. 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 5. Tandem stenosis of the right vertebral artery at the C6 level. 6. Dominant right thyroid nodule. This was noted on previous nuclear medicine study. Consider further evaluation with thyroid ultrasound. If patient is clinically hyperthyroid, consider nuclear medicine thyroid uptake and scan. 7. Atherosclerotic changes within the cavernous internal carotid arteries bilaterally without a significant stenosis. 8. Collateral flow to left MCA branches. 9. Hypoplastic basilar artery with distal narrowing. Fetal type posterior cerebral arteries are present. Electronically Signed   By: San Morelle M.D.   On: 01/20/2019 20:55   Ct Head Code Stroke Wo Contrast  Result Date: 01/20/2019 CLINICAL DATA:  Code stroke.  Right-sided weakness EXAM: CT HEAD WITHOUT CONTRAST TECHNIQUE: Contiguous axial images were obtained from the base of the skull through the vertex without intravenous contrast. COMPARISON:  MRI brain 04/08/2017. CT head without contrast 08/12/2011 FINDINGS: Brain: Remote lacunar infarcts are again noted at the left caudate head and extending into the corona radiata. Extensive periventricular white matter hypoattenuation is noted bilaterally. No acute infarct, hemorrhage, or mass lesion is present. Basal ganglia are unchanged. Insular ribbon is intact. No focal cortical lesion is present. The brainstem and cerebellum are within normal limits. The ventricles are of normal size. There is some ex vacuo dilation of the left lateral ventricle. No significant extraaxial fluid collection is present. Vascular: Atherosclerotic calcifications are present within the cavernous internal carotid arteries bilaterally and at the dural margin of the right vertebral artery. There is no hyperdense vessel. Skull: Calvarium is intact. No focal lytic or  blastic lesions are present. Sinuses/Orbits: A large polyp or mucous retention cyst is again noted within the left maxillary sinus. Mild mucosal thickening is present in the maxillary sinuses bilaterally. The paranasal sinuses and mastoid air cells are otherwise clear. The globes and orbits are within normal limits. ASPECTS [facility] Stroke Program Early CT Score) - Ganglionic level infarction (caudate, lentiform nuclei, internal capsule, insula, M1-M3 cortex): 7/7 - Supraganglionic infarction (M4-M6 cortex): 3/3 Total score (0-10 with 10 being normal): 10/10 IMPRESSION: 1. No acute intracranial abnormality or significant interval change. 2. Remote lacunar infarcts involving the left caudate head and centrum semi ovale. 3. Advanced atrophy and diffuse white matter disease is otherwise stable. 4. ASPECTS is 10/10 The above was relayed via text pager to Dr. Leonel Ramsay IS on 01/20/2019 at 20:24 . Electronically Signed   By: San Morelle M.D.   On: 01/20/2019 20:25    EKG: Independently reviewed.  Assessment/Plan Principal Problem:   TIA (transient ischemic attack) Active Problems:   Cerebral vascular disease   Hyperlipidemia   Hypertension   Psoriasis, unspecified    1. TIA / Stroke - 1.  Neuro consulted 2. Symptoms improving, NIH 2, not severe enough for TPA at this time 3. Stroke pathway 4. Continue plavix 5. MRI brain 6. 2d echo 7. PT/OT/SLP 8. A1C, FLP 2. HTN - 1. Hold home BP meds and allow permissive HTN 3. HLD - 1. Continue statin 4. Psoriasis - 1. Continue home meds once med rec completed  DVT prophylaxis: Lovenox Code Status: Full Family Communication: No family in room Disposition Plan: Home after admit Consults called: None Admission status: Place in obs, convert to IP if MRI positive    Rosario Duey, [place] [facility]  How to contact the [facility] Attending or Consulting provider [place] or covering provider during after hours [place], for this patient?   1. Check the care team in [facility] and look for a) attending/consulting [facility] provider listed and b) the [facility] team listed 2. Log into [email or web address]  Amion Physician Scheduling and messaging for groups and whole hospitals  On call and physician scheduling software for group practices, residents, hospitalists and other medical providers for call, clinic, rotation and shift schedules. On[employer] is a hospital-wide system for scheduling doctors and paging doctors on call. EasyPlot is for scientific plotting and data analysis.  [email or web address]  and use [place]'s universal password to access. If you do not have the password, please contact the hospital operator.  3. Locate the [facility] provider you are looking for under [facility] and page to a number that you can be directly reached. 4. If you still have difficulty reaching the provider, please page the [facility] (Director on Call) for the Hospitalists listed on amion for assistance.  01/20/2019, 10:23 PM

## 2019-01-20 NOTE — ED Notes (Signed)
ED TO INPATIENT HANDOFF REPORT  ED Nurse Name and Phone #: William Hamburger, [place]  S Name/Age/Gender Victoria Gutierrez 73 y.o. female Room/Bed: 018C/018C  Code Status   Code Status: Full Code  Home/SNF/Other Home Patient oriented to: situation Is this baseline? No   Triage Complete: Triage complete  Chief Complaint CODE STROKE  Triage Note Pt reported dropped the spoon out of an ice cream cup and felt weakness on right side; unable to lift arm and leg on left side. Incident occurred approx. 1845   Allergies No Known Allergies  Level of Care/Admitting Diagnosis ED Disposition    ED Disposition Condition Comment   Admit  Hospital Area: [place] [[id number]]  Level of Care: Telemetry Medical [104]  I expect the patient will be discharged within 24 hours: No (not a candidate for 5C-Observation unit)  Covid Evaluation: Asymptomatic Screening Protocol (No Symptoms)  Diagnosis: TIA (transient ischemic attack) [633354]  Admitting Physician: Etta Quill [phone]  Attending Physician: Etta Quill [[id number]  PT Class (Do Not Modify): Observation [104]  PT Acc Code (Do Not Modify): Observation [10022]       B Medical/Surgery History Past Medical History:  Diagnosis Date  . Hypertension   . Stroke [facility])    Past Surgical History:  Procedure Laterality Date  . ABDOMINAL HYSTERECTOMY       A IV Location/Drains/Wounds Patient Lines/Drains/Airways Status   Active Line/Drains/Airways    Name:   Placement date:   Placement time:   Site:   Days:   Peripheral IV 01/20/19 Left Antecubital   01/20/19    2042    Antecubital   less than 1          Intake/Output Last 24 hours No intake or output data in the 24 hours ending 01/20/19 2210  Labs/Imaging Results for orders placed or performed during the hospital encounter of 01/20/19 (from the past 48 hour(s))  Ethanol     Status: None   Collection Time: 01/20/19  8:15 PM  Result Value Ref Range   Alcohol,  Ethyl (B) <10 <10 mg/dL    Comment: (NOTE) Lowest detectable limit for serum alcohol is 10 mg/dL. For medical purposes only. Performed at [facility], [place] [address] [place], [place] [address]   Protime-INR     Status: None   Collection Time: 01/20/19  8:15 PM  Result Value Ref Range   Prothrombin Time 13.0 11.4 - 15.2 seconds   INR 1.0 0.8 - 1.2    Comment: (NOTE) INR goal varies based on device and disease states. Performed at [facility], [place] [address], [place], [place] [address]   APTT     Status: None   Collection Time: 01/20/19  8:15 PM  Result Value Ref Range   aPTT 28 24 - 36 seconds    Comment: Performed at [place] [address] [place], [place] [address]  CBC     Status: Abnormal   Collection Time: 01/20/19  8:15 PM  Result Value Ref Range   WBC 11.2 (H) 4.0 - 10.5 K/uL   RBC 4.36 3.87 - 5.11 MIL/uL   Hemoglobin 15.0 12.0 - 15.0 g/dL   HCT 44.6 36.0 - 46.0 %   MCV 102.3 (H) 80.0 - 100.0 fL   MCH 34.4 (H) 26.0 - 34.0 pg   MCHC 33.6 30.0 - 36.0 g/dL   RDW 12.2 11.5 - 15.5 %   Platelets 223 150 - 400 K/uL  nRBC 0.0 0.0 - 0.2 %    Comment: Performed at [facility], [place] [address], [place], [place] [address]  Differential     Status: None   Collection Time: 01/20/19  8:15 PM  Result Value Ref Range   Neutrophils Relative % 64 %   Neutro Abs 7.2 1.7 - 7.7 K/uL   Lymphocytes Relative 22 %   Lymphs Abs 2.5 0.7 - 4.0 K/uL   Monocytes Relative 8 %   Monocytes Absolute 0.8 0.1 - 1.0 K/uL   Eosinophils Relative 5 %   Eosinophils Absolute 0.5 0.0 - 0.5 K/uL   Basophils Relative 1 %   Basophils Absolute 0.1 0.0 - 0.1 K/uL   Immature Granulocytes 0 %   Abs Immature Granulocytes 0.04 0.00 - 0.07 K/uL    Comment: Performed at [place] [place] [address], [place], [place] [address]  Comprehensive metabolic panel     Status: Abnormal   Collection Time: 01/20/19  8:15 PM  Result Value Ref Range   Sodium 142 135 - 145 mmol/L    Potassium 3.6 3.5 - 5.1 mmol/L   Chloride 110 98 - 111 mmol/L   CO2 18 (L) 22 - 32 mmol/L   Glucose, Bld 143 (H) 70 - 99 mg/dL   BUN 14 8 - 23 mg/dL   Creatinine, Ser 1.25 (H) 0.44 - 1.00 mg/dL   Calcium 8.9 8.9 - 10.3 mg/dL   Total Protein 5.4 (L) 6.5 - 8.1 g/dL   Albumin 3.4 (L) 3.5 - 5.0 g/dL   AST 19 15 - 41 U/L   ALT 17 0 - 44 U/L   Alkaline Phosphatase 74 38 - 126 U/L   Total Bilirubin 0.6 0.3 - 1.2 mg/dL   GFR calc non Af Amer 43 (L) >60 mL/min   GFR calc Af Amer 50 (L) >60 mL/min   Anion gap 14 5 - 15    Comment: Performed at [facility], [place] [address], [place], [place] [address]  I-stat chem 8, ED     Status: Abnormal   Collection Time: 01/20/19  8:17 PM  Result Value Ref Range   Sodium 141 135 - 145 mmol/L   Potassium 3.3 (L) 3.5 - 5.1 mmol/L   Chloride 109 98 - 111 mmol/L   BUN 15 8 - 23 mg/dL   Creatinine, Ser 1.20 (H) 0.44 - 1.00 mg/dL   Glucose, Bld 143 (H) 70 - 99 mg/dL   Calcium, Ion 1.03 (L) 1.15 - 1.40 mmol/L   TCO2 26 22 - 32 mmol/L   Hemoglobin 14.3 12.0 - 15.0 g/dL   HCT 42.0 36.0 - 46.0 %  Urine rapid drug screen (hosp performed)     Status: Abnormal   Collection Time: 01/20/19  9:11 PM  Result Value Ref Range   Opiates NONE DETECTED NONE DETECTED   Cocaine NONE DETECTED NONE DETECTED   Benzodiazepines NONE DETECTED NONE DETECTED   Amphetamines NONE DETECTED NONE DETECTED   Tetrahydrocannabinol POSITIVE (A) NONE DETECTED   Barbiturates NONE DETECTED NONE DETECTED    Comment: (NOTE) DRUG SCREEN FOR MEDICAL PURPOSES ONLY.  IF CONFIRMATION IS NEEDED FOR ANY PURPOSE, NOTIFY LAB WITHIN 5 DAYS. LOWEST DETECTABLE LIMITS FOR URINE DRUG SCREEN Drug Class                     Cutoff (ng/mL) Amphetamine and metabolites    1000 Barbiturate and metabolites    200 Benzodiazepine  099 Tricyclics and metabolites     300 Opiates and metabolites        300 Cocaine and metabolites        300 THC                            50 Performed at  [facility], [place] [address] [place], [place] [address]   Urinalysis, Routine w reflex microscopic     Status: Abnormal   Collection Time: 01/20/19  9:11 PM  Result Value Ref Range   Color, Urine YELLOW YELLOW   APPearance HAZY (A) CLEAR   Specific Gravity, Urine 1.032 (H) 1.005 - 1.030   pH 8.0 5.0 - 8.0   Glucose, UA NEGATIVE NEGATIVE mg/dL   Hgb urine dipstick SMALL (A) NEGATIVE   Bilirubin Urine NEGATIVE NEGATIVE   Ketones, ur NEGATIVE NEGATIVE mg/dL   Protein, ur NEGATIVE NEGATIVE mg/dL   Nitrite NEGATIVE NEGATIVE   Leukocytes,Ua NEGATIVE NEGATIVE   RBC / HPF 6-10 0 - 5 RBC/hpf   WBC, UA 6-10 0 - 5 WBC/hpf   Bacteria, UA RARE (A) NONE SEEN   Squamous Epithelial / LPF 0-5 0 - 5   Mucus PRESENT     Comment: Performed at [facility], [place] [address], [place], [place] [address]   Ct Angio Head W Or Wo Contrast  Result Date: 01/20/2019 CLINICAL DATA:  Code stroke.  Right-sided weakness. EXAM: CT ANGIOGRAPHY HEAD AND NECK TECHNIQUE: Multidetector CT imaging of the head and neck was performed using the standard protocol during bolus administration of intravenous contrast. Multiplanar CT image reconstructions and MIPs were obtained to evaluate the vascular anatomy. Carotid stenosis measurements (when applicable) are obtained utilizing NASCET criteria, using the distal internal carotid diameter as the denominator. CONTRAST:  80mL OMNIPAQUE IOHEXOL 350 MG/ML SOLN COMPARISON:  CT head without contrast 01/20/2019. MRI brain 04/08/2017 FINDINGS: CTA NECK FINDINGS Aortic arch: Atherosclerotic calcifications are present at the origins the great vessels. There is no significant stenosis of greater than 50% relative to the more distal vessels. Calcifications are greatest at the subclavian artery. There is no aneurysm. Right carotid system: The right common carotid artery is within normal limits. Atherosclerotic calcifications are present along the medial aspect of the proximal right ICA.  The minimal luminal diameter is 3 mm. The more distal right ICA is within normal limits. Left carotid system: The left common carotid artery is within normal limits. Dense calcifications are present without a significant stenosis relative to the more distal vessel. The more distal left ICA is normal. Vertebral arteries: Dense calcifications are present at the origin of the dominant right vertebral artery. There is a high-grade stenosis. There is no stenosis at the non dominant left vertebral artery origin. There is a moderate tandem stenosis at the entry into the spinal canal at C6 on the right. The more distal right vertebral artery is within normal limits. Calcifications are present at C1 without significant stenosis. The left vertebral artery is hypoplastic without focal stenosis in the neck. Skeleton: Multilevel degenerative changes are present in the cervical spine. Endplate changes are most pronounced at C5-6 and C6-7 with osseous foraminal narrowing bilaterally. No focal lytic or blastic lesions are present. Advanced facet hypertrophy is present at C2-3 and C3-4. Other neck: No focal mucosal or submucosal lesions are present. A dominant right thyroid nodule measures 2.1 x 1.3 x 1.6 cm. No significant adenopathy is present. The salivary glands are  within normal limits. Upper chest: Mild dependent atelectasis is present. Lung apices are otherwise clear without focal nodule, mass, or airspace disease. The upper mediastinum is clear. Review of the MIP images confirms the above findings CTA HEAD FINDINGS Anterior circulation: Dense atherosclerotic calcifications are present throughout the cavernous internal carotid arteries bilaterally without significant stenosis relative to the more distal vessel. ICA termini are within normal limits. Right M1 segment is normal. Left A1 segment is dominant. The anterior communicating artery is patent. There is chronic proximal left MCA occlusion with collateral flow to left MCA  branches. Right MCA bifurcation is intact. There is some attenuation of distal right MCA and bilateral distal ACA branches without a significant proximal stenosis or occlusion. Posterior circulation: The left vertebral artery terminates at the PICA. Dense atherosclerotic calcifications are present at the vertebral artery origin on the right. There is at least 50% stenosis relative to the more distal vessel. The basilar artery is small with moderate distal stenosis. The basilar artery terminates at the superior cerebellar arteries with small P1 segments extending into fetal type posterior cerebral arteries bilaterally. There is mild narrowing of the proximal P2 segments bilaterally without a significant stenosis or occlusion. There is some attenuation of distal PCA branch vessels bilaterally. Venous sinuses: The dural sinuses are patent. The right transverse sinus is dominant. The straight sinus and deep cerebral veins are intact. Cortical veins are within normal limits. Anatomic variants: Fetal type posterior cerebral arteries bilaterally. Review of the MIP images confirms the above findings IMPRESSION: 1. 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 2. 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 3. Atherosclerotic changes of the proximal internal carotid arteries bilaterally without significant stenosis relative to the more distal vessels. 4. 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 5. Tandem stenosis of the right vertebral artery at the C6 level. 6. Dominant right thyroid nodule. This was noted on previous nuclear medicine study. Consider further evaluation with thyroid ultrasound. If patient is clinically hyperthyroid, consider nuclear medicine thyroid uptake and scan. 7. Atherosclerotic changes within the cavernous internal carotid arteries bilaterally without a significant stenosis. 8. Collateral flow to left MCA branches. 9. Hypoplastic basilar  artery with distal narrowing. Fetal type posterior cerebral arteries are present. Electronically Signed   By: San Morelle M.D.   On: 01/20/2019 20:55   Ct Angio Neck W Or Wo Contrast  Result Date: 01/20/2019 CLINICAL DATA:  Code stroke.  Right-sided weakness. EXAM: CT ANGIOGRAPHY HEAD AND NECK TECHNIQUE: Multidetector CT imaging of the head and neck was performed using the standard protocol during bolus administration of intravenous contrast. Multiplanar CT image reconstructions and MIPs were obtained to evaluate the vascular anatomy. Carotid stenosis measurements (when applicable) are obtained utilizing NASCET criteria, using the distal internal carotid diameter as the denominator. CONTRAST:  45mL OMNIPAQUE IOHEXOL 350 MG/ML SOLN COMPARISON:  CT head without contrast 01/20/2019. MRI brain 04/08/2017 FINDINGS: CTA NECK FINDINGS Aortic arch: Atherosclerotic calcifications are present at the origins the great vessels. There is no significant stenosis of greater than 50% relative to the more distal vessels. Calcifications are greatest at the subclavian artery. There is no aneurysm. Right carotid system: The right common carotid artery is within normal limits. Atherosclerotic calcifications are present along the medial aspect of the proximal right ICA. The minimal luminal diameter is 3 mm. The more distal right ICA is within normal limits. Left carotid system: The left common carotid artery is within normal limits. Dense calcifications  are present without a significant stenosis relative to the more distal vessel. The more distal left ICA is normal. Vertebral arteries: Dense calcifications are present at the origin of the dominant right vertebral artery. There is a high-grade stenosis. There is no stenosis at the non dominant left vertebral artery origin. There is a moderate tandem stenosis at the entry into the spinal canal at C6 on the right. The more distal right vertebral artery is within normal limits.  Calcifications are present at C1 without significant stenosis. The left vertebral artery is hypoplastic without focal stenosis in the neck. Skeleton: Multilevel degenerative changes are present in the cervical spine. Endplate changes are most pronounced at C5-6 and C6-7 with osseous foraminal narrowing bilaterally. No focal lytic or blastic lesions are present. Advanced facet hypertrophy is present at C2-3 and C3-4. Other neck: No focal mucosal or submucosal lesions are present. A dominant right thyroid nodule measures 2.1 x 1.3 x 1.6 cm. No significant adenopathy is present. The salivary glands are within normal limits. Upper chest: Mild dependent atelectasis is present. Lung apices are otherwise clear without focal nodule, mass, or airspace disease. The upper mediastinum is clear. Review of the MIP images confirms the above findings CTA HEAD FINDINGS Anterior circulation: Dense atherosclerotic calcifications are present throughout the cavernous internal carotid arteries bilaterally without significant stenosis relative to the more distal vessel. ICA termini are within normal limits. Right M1 segment is normal. Left A1 segment is dominant. The anterior communicating artery is patent. There is chronic proximal left MCA occlusion with collateral flow to left MCA branches. Right MCA bifurcation is intact. There is some attenuation of distal right MCA and bilateral distal ACA branches without a significant proximal stenosis or occlusion. Posterior circulation: The left vertebral artery terminates at the PICA. Dense atherosclerotic calcifications are present at the vertebral artery origin on the right. There is at least 50% stenosis relative to the more distal vessel. The basilar artery is small with moderate distal stenosis. The basilar artery terminates at the superior cerebellar arteries with small P1 segments extending into fetal type posterior cerebral arteries bilaterally. There is mild narrowing of the proximal  P2 segments bilaterally without a significant stenosis or occlusion. There is some attenuation of distal PCA branch vessels bilaterally. Venous sinuses: The dural sinuses are patent. The right transverse sinus is dominant. The straight sinus and deep cerebral veins are intact. Cortical veins are within normal limits. Anatomic variants: Fetal type posterior cerebral arteries bilaterally. Review of the MIP images confirms the above findings IMPRESSION: 1. 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 2. 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 3. Atherosclerotic changes of the proximal internal carotid arteries bilaterally without significant stenosis relative to the more distal vessels. 4. 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 5. Tandem stenosis of the right vertebral artery at the C6 level. 6. Dominant right thyroid nodule. This was noted on previous nuclear medicine study. Consider further evaluation with thyroid ultrasound. If patient is clinically hyperthyroid, consider nuclear medicine thyroid uptake and scan. 7. Atherosclerotic changes within the cavernous internal carotid arteries bilaterally without a significant stenosis. 8. Collateral flow to left MCA branches. 9. Hypoplastic basilar artery with distal narrowing. Fetal type posterior cerebral arteries are present. Electronically Signed   By: San Morelle M.D.   On: 01/20/2019 20:55   Ct Head Code Stroke Wo Contrast  Result Date: 01/20/2019 CLINICAL DATA:  Code stroke.  Right-sided weakness EXAM: CT HEAD WITHOUT  CONTRAST TECHNIQUE: Contiguous axial images were obtained from the base of the skull through the vertex without intravenous contrast. COMPARISON:  MRI brain 04/08/2017. CT head without contrast 08/12/2011 FINDINGS: Brain: Remote lacunar infarcts are again noted at the left caudate head and extending into the corona radiata. Extensive periventricular white matter  hypoattenuation is noted bilaterally. No acute infarct, hemorrhage, or mass lesion is present. Basal ganglia are unchanged. Insular ribbon is intact. No focal cortical lesion is present. The brainstem and cerebellum are within normal limits. The ventricles are of normal size. There is some ex vacuo dilation of the left lateral ventricle. No significant extraaxial fluid collection is present. Vascular: Atherosclerotic calcifications are present within the cavernous internal carotid arteries bilaterally and at the dural margin of the right vertebral artery. There is no hyperdense vessel. Skull: Calvarium is intact. No focal lytic or blastic lesions are present. Sinuses/Orbits: A large polyp or mucous retention cyst is again noted within the left maxillary sinus. Mild mucosal thickening is present in the maxillary sinuses bilaterally. The paranasal sinuses and mastoid air cells are otherwise clear. The globes and orbits are within normal limits. ASPECTS [facility] Stroke Program Early CT Score) - Ganglionic level infarction (caudate, lentiform nuclei, internal capsule, insula, M1-M3 cortex): 7/7 - Supraganglionic infarction (M4-M6 cortex): 3/3 Total score (0-10 with 10 being normal): 10/10 IMPRESSION: 1. No acute intracranial abnormality or significant interval change. 2. Remote lacunar infarcts involving the left caudate head and centrum semi ovale. 3. Advanced atrophy and diffuse white matter disease is otherwise stable. 4. ASPECTS is 10/10 The above was relayed via text pager to Dr. Leonel Ramsay IS on 01/20/2019 at 20:24 . Electronically Signed   By: San Morelle M.D.   On: 01/20/2019 20:25    Pending Labs Unresulted Labs (From admission, onward)    Start     Ordered   01/21/19 0500  Hemoglobin A1c  Tomorrow morning,   R     01/20/19 2204   01/21/19 0500  Lipid panel  Tomorrow morning,   R    Comments: Fasting    01/20/19 2204   01/20/19 2201  SARS Coronavirus 2 (CEPHEID - Performed in [place]  hospital lab), Hosp Order  (Asymptomatic Patients Labs)  Once,   STAT    Question:  Rule Out  Answer:  Yes   01/20/19 2200          Vitals/Pain Today's Vitals   01/20/19 2100 01/20/19 2130 01/20/19 2145 01/20/19 2200  BP: (!) 218/108 (!) 208/128  (!) 185/101  Pulse: 84   84  Resp: (!) 22 (!) 24 15 (!) 22  Temp:      TempSrc:      SpO2: 98%   96%  Weight:      PainSc:        Isolation Precautions No active isolations  Medications Medications   stroke: mapping our early stages of recovery book (has no administration in time range)  acetaminophen (TYLENOL) tablet 650 mg (has no administration in time range)    Or  acetaminophen (TYLENOL) solution 650 mg (has no administration in time range)    Or  acetaminophen (TYLENOL) suppository 650 mg (has no administration in time range)  enoxaparin (LOVENOX) injection 40 mg (has no administration in time range)  aspirin suppository 300 mg (has no administration in time range)    Or  aspirin tablet 325 mg (has no administration in time range)  aspirin tablet 325 mg (has no administration in time range)  iohexol (OMNIPAQUE)  350 MG/ML injection 75 mL (75 mLs Intravenous Contrast Given 01/20/19 2030)    Mobility walks with person assist     Focused Assessments Neuro Assessment Handoff:  Swallow screen pass? Yes    NIH Stroke Scale ( + Modified Stroke Scale Criteria)  Interval: Initial Level of Consciousness (1a.)   : Alert, keenly responsive LOC Questions (1b. )   +: Answers both questions correctly LOC Commands (1c. )   + : Performs both tasks correctly Best Gaze (2. )  +: Normal Visual (3. )  +: No visual loss Facial Palsy (4. )    : Normal symmetrical movements Motor Arm, Left (5a. )   +: No drift Motor Arm, Right (5b. )   +: No drift Motor Leg, Left (6a. )   +: No drift Motor Leg, Right (6b. )   +: Drift Limb Ataxia (7. ): Absent Sensory (8. )   +: Normal, no sensory loss Best Language (9. )   +: No  aphasia Dysarthria (10. ): Normal Extinction/Inattention (11.)   +: No Abnormality Modified SS Total  +: 1 Complete NIHSS TOTAL: 2 Last date known well: 01/20/19 Last time known well: 1815 Neuro Assessment: Exceptions to WDL Neuro Checks:   Initial (01/20/19 2016)  Last Documented NIHSS Modified Score: 1 (01/20/19 2116) Has TPA been given? No If patient is a Neuro Trauma and patient is going to OR before floor call report to [place] nurse: [phone] or [phone]     R Recommendations: See Admitting Provider Note  Report given to:   Additional Notes:  [place] 01/20/2019 1815; increased weakness on right side witnessed by family member. Hx of TIA & HTN. Initial NIH 3 (arm and leg weakness) otherwise A\&ox4. Modifies NIH is 1. IV LAC. VSS

## 2019-01-20 NOTE — ED Triage Notes (Signed)
Pt reported dropped the spoon out of an ice cream cup and felt weakness on right side; unable to lift arm and leg on left side. Incident occurred approx. [place]

## 2019-01-20 NOTE — ED Provider Notes (Signed)
[place] EMERGENCY DEPARTMENT Provider Note   CSN: [id number] Arrival date & time: 01/20/19  2009  An emergency department physician performed an initial assessment on this suspected stroke patient at 2009.  History   Chief Complaint Chief Complaint  Patient presents with   Code Stroke    HPI Victoria Gutierrez is a 73 y.o. female with a past medical history of HTN, stroke who presents to ED as a code stroke. She had sudden onset of weakness on R side while holding a spoon at 1845.  She reports feeling like she could not move her right arm and right leg.  States that her symptoms gradually improved while being in the ED.  She denies any recent injuries or falls, headache, vision changes, vomiting, chest pain.  She was told to discontinue her antihypertensive several years ago due to hypotension.     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      Home Medications    Prior to Admission medications   Medication Sig Start Date End Date Taking? Authorizing Provider  citalopram (CELEXA) 20 MG tablet Take 20 mg by mouth daily.    [provider]  clopidogrel (PLAVIX) 75 MG tablet TAKE 1 TABLET EVERY DAY 11/11/16   [provider]  folic acid (FOLVITE) 1 MG tablet Take 1 tablet by mouth daily. ON NON-METHOTREXATE DAYS 01/09/18   [provider]  hydrochlorothiazide (MICROZIDE) 12.5 MG capsule Take 12.5 mg by mouth daily.    [provider]    lisinopril (PRINIVIL,ZESTRIL) 20 MG tablet TAKE 1 TABLET EVERY DAY 11/11/16   [provider]  Melatonin 5 MG TBDP Take 5 mg by mouth at bedtime.  05/12/16   [provider]  methotrexate (RHEUMATREX) 2.5 MG tablet Take 15 mg by mouth once a week. 02/22/18   [provider]  ondansetron (ZOFRAN ODT) 4 MG disintegrating tablet Take 1 tablet (4 mg total) by mouth every 6 (six) hours as needed for nausea or vomiting. 03/18/18   Delman Kitten, MD  Potassium 99 MG TABS Take 0.5 tablets by mouth daily.    [provider]  pregabalin (LYRICA) 100 MG capsule Take 1 capsule (100 mg total) by mouth 3 (three) times daily. 07/21/17   Gillis Santa, MD  traMADol (ULTRAM) 50 MG tablet Take 50 mg by mouth every 12 (twelve) hours as needed. 05/11/17   [provider]    Family History Family History  Problem Relation Age of Onset   Heart disease Mother    Heart disease Father    Breast cancer Neg Hx     Social History Social History   Tobacco Use   Smoking status: Current Every Day Smoker    Packs/day: 0.50    Years: 30.00    Pack years: 15.00    Types: Cigarettes    Start date: 03/10/1987   Smokeless tobacco: Never Used  Substance Use Topics   Alcohol use: No   Drug  use: No     Allergies   Patient has no known allergies.   Review of Systems Review of Systems  Constitutional: Negative for appetite change, chills and fever.  HENT: Negative for ear pain, rhinorrhea, sneezing and sore throat.   Eyes: Negative for photophobia and visual disturbance.  Respiratory: Negative for cough, chest tightness, shortness of breath and wheezing.   Cardiovascular: Negative for chest pain and palpitations.  Gastrointestinal: Negative for abdominal pain, blood in stool, constipation, diarrhea, nausea and vomiting.  Genitourinary: Negative for dysuria, hematuria and urgency.  Musculoskeletal: Negative for myalgias.  Skin: Negative for rash.  Neurological:  Positive for weakness and numbness. Negative for dizziness and light-headedness.     Physical Exam Updated Vital Signs BP (!) 204/102 (BP Location: Right Arm)    Pulse 85    Temp 98.4 F (36.9 C) (Oral)    Resp 18    Wt 79.9 kg    SpO2 100%    BMI 28.43 kg/m   Physical Exam Vitals signs and nursing note reviewed.  Constitutional:      General: She is not in acute distress.    Appearance: She is well-developed.  HENT:     Head: Normocephalic and atraumatic.     Nose: Nose normal.  Eyes:     General: No scleral icterus.       Right eye: No discharge.        Left eye: No discharge.     Conjunctiva/sclera: Conjunctivae normal.     Pupils: Pupils are equal, round, and reactive to light.  Neck:     Musculoskeletal: Normal range of motion and neck supple.  Cardiovascular:     Rate and Rhythm: Normal rate and regular rhythm.     Heart sounds: Normal heart sounds. No murmur. No friction rub. No gallop.   Pulmonary:     Effort: Pulmonary effort is normal. No respiratory distress.     Breath sounds: Normal breath sounds.  Abdominal:     General: Bowel sounds are normal. There is no distension.     Palpations: Abdomen is soft.     Tenderness: There is no abdominal tenderness. There is no guarding.  Musculoskeletal: Normal range of motion.  Skin:    General: Skin is warm and dry.     Findings: No rash.  Neurological:     General: No focal deficit present.     Mental Status: She is alert and oriented to person, place, and time.     Cranial Nerves: No cranial nerve deficit.     Sensory: No sensory deficit.     Motor: No weakness or abnormal muscle tone.     Coordination: Coordination normal.     Comments: Strength 5/5 in bilateral UE and LE. Sensation intact to light touch of bilateral upper and lower extremities.  No facial asymmetry noted.      ED Treatments / Results  Labs (all labs ordered are listed, but only abnormal results are displayed) Labs Reviewed  CBC - Abnormal;  Notable for the following components:      Result Value   WBC 11.2 (*)    MCV 102.3 (*)    MCH 34.4 (*)    All other components within normal limits  COMPREHENSIVE METABOLIC PANEL - Abnormal; Notable for the following components:   CO2 18 (*)    Glucose, Bld 143 (*)    Creatinine, Ser 1.25 (*)    Total Protein 5.4 (*)    Albumin 3.4 (*)  GFR calc non Af Amer 43 (*)    GFR calc Af Amer 50 (*)    All other components within normal limits  I-STAT CHEM 8, ED - Abnormal; Notable for the following components:   Potassium 3.3 (*)    Creatinine, Ser 1.20 (*)    Glucose, Bld 143 (*)    Calcium, Ion 1.03 (*)    All other components within normal limits  ETHANOL  PROTIME-INR  APTT  DIFFERENTIAL  RAPID URINE DRUG SCREEN, HOSP PERFORMED  URINALYSIS, ROUTINE W REFLEX MICROSCOPIC    EKG None  Radiology Ct Angio Head W Or Wo Contrast  Result Date: 01/20/2019 CLINICAL DATA:  Code stroke.  Right-sided weakness. EXAM: CT ANGIOGRAPHY HEAD AND NECK TECHNIQUE: Multidetector CT imaging of the head and neck was performed using the standard protocol during bolus administration of intravenous contrast. Multiplanar CT image reconstructions and MIPs were obtained to evaluate the vascular anatomy. Carotid stenosis measurements (when applicable) are obtained utilizing NASCET criteria, using the distal internal carotid diameter as the denominator. CONTRAST:  34mL OMNIPAQUE IOHEXOL 350 MG/ML SOLN COMPARISON:  CT head without contrast 01/20/2019. MRI brain 04/08/2017 FINDINGS: CTA NECK FINDINGS Aortic arch: Atherosclerotic calcifications are present at the origins the great vessels. There is no significant stenosis of greater than 50% relative to the more distal vessels. Calcifications are greatest at the subclavian artery. There is no aneurysm. Right carotid system: The right common carotid artery is within normal limits. Atherosclerotic calcifications are present along the medial aspect of the proximal right  ICA. The minimal luminal diameter is 3 mm. The more distal right ICA is within normal limits. Left carotid system: The left common carotid artery is within normal limits. Dense calcifications are present without a significant stenosis relative to the more distal vessel. The more distal left ICA is normal. Vertebral arteries: Dense calcifications are present at the origin of the dominant right vertebral artery. There is a high-grade stenosis. There is no stenosis at the non dominant left vertebral artery origin. There is a moderate tandem stenosis at the entry into the spinal canal at C6 on the right. The more distal right vertebral artery is within normal limits. Calcifications are present at C1 without significant stenosis. The left vertebral artery is hypoplastic without focal stenosis in the neck. Skeleton: Multilevel degenerative changes are present in the cervical spine. Endplate changes are most pronounced at C5-6 and C6-7 with osseous foraminal narrowing bilaterally. No focal lytic or blastic lesions are present. Advanced facet hypertrophy is present at C2-3 and C3-4. Other neck: No focal mucosal or submucosal lesions are present. A dominant right thyroid nodule measures 2.1 x 1.3 x 1.6 cm. No significant adenopathy is present. The salivary glands are within normal limits. Upper chest: Mild dependent atelectasis is present. Lung apices are otherwise clear without focal nodule, mass, or airspace disease. The upper mediastinum is clear. Review of the MIP images confirms the above findings CTA HEAD FINDINGS Anterior circulation: Dense atherosclerotic calcifications are present throughout the cavernous internal carotid arteries bilaterally without significant stenosis relative to the more distal vessel. ICA termini are within normal limits. Right M1 segment is normal. Left A1 segment is dominant. The anterior communicating artery is patent. There is chronic proximal left MCA occlusion with collateral flow to left  MCA branches. Right MCA bifurcation is intact. There is some attenuation of distal right MCA and bilateral distal ACA branches without a significant proximal stenosis or occlusion. Posterior circulation: The left vertebral artery terminates at the PICA. Dense  atherosclerotic calcifications are present at the vertebral artery origin on the right. There is at least 50% stenosis relative to the more distal vessel. The basilar artery is small with moderate distal stenosis. The basilar artery terminates at the superior cerebellar arteries with small P1 segments extending into fetal type posterior cerebral arteries bilaterally. There is mild narrowing of the proximal P2 segments bilaterally without a significant stenosis or occlusion. There is some attenuation of distal PCA branch vessels bilaterally. Venous sinuses: The dural sinuses are patent. The right transverse sinus is dominant. The straight sinus and deep cerebral veins are intact. Cortical veins are within normal limits. Anatomic variants: Fetal type posterior cerebral arteries bilaterally. Review of the MIP images confirms the above findings IMPRESSION: 1. 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 2. 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 3. Atherosclerotic changes of the proximal internal carotid arteries bilaterally without significant stenosis relative to the more distal vessels. 4. 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 5. Tandem stenosis of the right vertebral artery at the C6 level. 6. Dominant right thyroid nodule. This was noted on previous nuclear medicine study. Consider further evaluation with thyroid ultrasound. If patient is clinically hyperthyroid, consider nuclear medicine thyroid uptake and scan. 7. Atherosclerotic changes within the cavernous internal carotid arteries bilaterally without a significant stenosis. 8. Collateral flow to left MCA branches. 9. Hypoplastic basilar  artery with distal narrowing. Fetal type posterior cerebral arteries are present. Electronically Signed   By: San Morelle M.D.   On: 01/20/2019 20:55   Ct Angio Neck W Or Wo Contrast  Result Date: 01/20/2019 CLINICAL DATA:  Code stroke.  Right-sided weakness. EXAM: CT ANGIOGRAPHY HEAD AND NECK TECHNIQUE: Multidetector CT imaging of the head and neck was performed using the standard protocol during bolus administration of intravenous contrast. Multiplanar CT image reconstructions and MIPs were obtained to evaluate the vascular anatomy. Carotid stenosis measurements (when applicable) are obtained utilizing NASCET criteria, using the distal internal carotid diameter as the denominator. CONTRAST:  43mL OMNIPAQUE IOHEXOL 350 MG/ML SOLN COMPARISON:  CT head without contrast 01/20/2019. MRI brain 04/08/2017 FINDINGS: CTA NECK FINDINGS Aortic arch: Atherosclerotic calcifications are present at the origins the great vessels. There is no significant stenosis of greater than 50% relative to the more distal vessels. Calcifications are greatest at the subclavian artery. There is no aneurysm. Right carotid system: The right common carotid artery is within normal limits. Atherosclerotic calcifications are present along the medial aspect of the proximal right ICA. The minimal luminal diameter is 3 mm. The more distal right ICA is within normal limits. Left carotid system: The left common carotid artery is within normal limits. Dense calcifications are present without a significant stenosis relative to the more distal vessel. The more distal left ICA is normal. Vertebral arteries: Dense calcifications are present at the origin of the dominant right vertebral artery. There is a high-grade stenosis. There is no stenosis at the non dominant left vertebral artery origin. There is a moderate tandem stenosis at the entry into the spinal canal at C6 on the right. The more distal right vertebral artery is within normal limits.  Calcifications are present at C1 without significant stenosis. The left vertebral artery is hypoplastic without focal stenosis in the neck. Skeleton: Multilevel degenerative changes are present in the cervical spine. Endplate changes are most pronounced at C5-6 and C6-7 with osseous foraminal narrowing bilaterally. No focal lytic or blastic lesions are present. Advanced facet hypertrophy  is present at C2-3 and C3-4. Other neck: No focal mucosal or submucosal lesions are present. A dominant right thyroid nodule measures 2.1 x 1.3 x 1.6 cm. No significant adenopathy is present. The salivary glands are within normal limits. Upper chest: Mild dependent atelectasis is present. Lung apices are otherwise clear without focal nodule, mass, or airspace disease. The upper mediastinum is clear. Review of the MIP images confirms the above findings CTA HEAD FINDINGS Anterior circulation: Dense atherosclerotic calcifications are present throughout the cavernous internal carotid arteries bilaterally without significant stenosis relative to the more distal vessel. ICA termini are within normal limits. Right M1 segment is normal. Left A1 segment is dominant. The anterior communicating artery is patent. There is chronic proximal left MCA occlusion with collateral flow to left MCA branches. Right MCA bifurcation is intact. There is some attenuation of distal right MCA and bilateral distal ACA branches without a significant proximal stenosis or occlusion. Posterior circulation: The left vertebral artery terminates at the PICA. Dense atherosclerotic calcifications are present at the vertebral artery origin on the right. There is at least 50% stenosis relative to the more distal vessel. The basilar artery is small with moderate distal stenosis. The basilar artery terminates at the superior cerebellar arteries with small P1 segments extending into fetal type posterior cerebral arteries bilaterally. There is mild narrowing of the proximal  P2 segments bilaterally without a significant stenosis or occlusion. There is some attenuation of distal PCA branch vessels bilaterally. Venous sinuses: The dural sinuses are patent. The right transverse sinus is dominant. The straight sinus and deep cerebral veins are intact. Cortical veins are within normal limits. Anatomic variants: Fetal type posterior cerebral arteries bilaterally. Review of the MIP images confirms the above findings IMPRESSION: 1. 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 2. 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 3. Atherosclerotic changes of the proximal internal carotid arteries bilaterally without significant stenosis relative to the more distal vessels. 4. 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 5. Tandem stenosis of the right vertebral artery at the C6 level. 6. Dominant right thyroid nodule. This was noted on previous nuclear medicine study. Consider further evaluation with thyroid ultrasound. If patient is clinically hyperthyroid, consider nuclear medicine thyroid uptake and scan. 7. Atherosclerotic changes within the cavernous internal carotid arteries bilaterally without a significant stenosis. 8. Collateral flow to left MCA branches. 9. Hypoplastic basilar artery with distal narrowing. Fetal type posterior cerebral arteries are present. Electronically Signed   By: San Morelle M.D.   On: 01/20/2019 20:55   Ct Head Code Stroke Wo Contrast  Result Date: 01/20/2019 CLINICAL DATA:  Code stroke.  Right-sided weakness EXAM: CT HEAD WITHOUT CONTRAST TECHNIQUE: Contiguous axial images were obtained from the base of the skull through the vertex without intravenous contrast. COMPARISON:  MRI brain 04/08/2017. CT head without contrast 08/12/2011 FINDINGS: Brain: Remote lacunar infarcts are again noted at the left caudate head and extending into the corona radiata. Extensive periventricular white matter  hypoattenuation is noted bilaterally. No acute infarct, hemorrhage, or mass lesion is present. Basal ganglia are unchanged. Insular ribbon is intact. No focal cortical lesion is present. The brainstem and cerebellum are within normal limits. The ventricles are of normal size. There is some ex vacuo dilation of the left lateral ventricle. No significant extraaxial fluid collection is present. Vascular: Atherosclerotic calcifications are present within the cavernous internal carotid arteries bilaterally and at the dural margin of the right vertebral artery. There  is no hyperdense vessel. Skull: Calvarium is intact. No focal lytic or blastic lesions are present. Sinuses/Orbits: A large polyp or mucous retention cyst is again noted within the left maxillary sinus. Mild mucosal thickening is present in the maxillary sinuses bilaterally. The paranasal sinuses and mastoid air cells are otherwise clear. The globes and orbits are within normal limits. ASPECTS [facility] Stroke Program Early CT Score) - Ganglionic level infarction (caudate, lentiform nuclei, internal capsule, insula, M1-M3 cortex): 7/7 - Supraganglionic infarction (M4-M6 cortex): 3/3 Total score (0-10 with 10 being normal): 10/10 IMPRESSION: 1. No acute intracranial abnormality or significant interval change. 2. Remote lacunar infarcts involving the left caudate head and centrum semi ovale. 3. Advanced atrophy and diffuse white matter disease is otherwise stable. 4. ASPECTS is 10/10 The above was relayed via text pager to Dr. Leonel Ramsay IS on 01/20/2019 at 20:24 . Electronically Signed   By: San Morelle M.D.   On: 01/20/2019 20:25    Procedures .Critical Care Performed by: Delia Heady, PA-C Authorized by: Delia Heady, PA-C   Critical care provider statement:    Critical care time (minutes):  35   Critical care time was exclusive of:  Separately billable procedures and treating other patients   Critical care was necessary to treat or  prevent imminent or life-threatening deterioration of the following conditions:  CNS failure or compromise and circulatory failure   Critical care was time spent personally by me on the following activities:  Development of treatment plan with patient or surrogate, discussions with consultants, ordering and performing treatments and interventions, ordering and review of laboratory studies, ordering and review of radiographic studies, review of old charts and obtaining history from patient or surrogate   I assumed direction of critical care for this patient from another provider in my specialty: no     (including critical care time)  Medications Ordered in ED Medications  iohexol (OMNIPAQUE) 350 MG/ML injection 75 mL (75 mLs Intravenous Contrast Given 01/20/19 2030)     Initial Impression / Assessment and Plan / ED Course  I have reviewed the triage vital signs and the nursing notes.  Pertinent labs & imaging results that were available during my care of the patient were reviewed by me and considered in my medical decision making (see chart for details).        Patient presents to ED as code stroke for sudden onset of right upper and lower extremity weakness.  Symptoms gradually improve while arrival to ED. Symmetric strength noted in bilateral upper and lower extremities, no facial asymmetry. CTA shows chronic occlusion. Per neurology, Dr. Leonel Ramsay, patient not candidate for TPA.  Asked that we admit to hospitalist for TIA work-up.  Final Clinical Impressions(s) / ED Diagnoses   Final diagnoses:  TIA (transient ischemic attack)    ED Discharge Orders    None       Delia Heady, PA-C 01/20/19 2159    Virgel Manifold, MD 01/21/19 2351

## 2019-01-21 ENCOUNTER — Encounter (HOSPITAL_COMMUNITY): Payer: Self-pay | Admitting: *Deleted

## 2019-01-21 ENCOUNTER — Inpatient Hospital Stay (HOSPITAL_COMMUNITY): Payer: Medicare HMO

## 2019-01-21 DIAGNOSIS — L409 Psoriasis, unspecified: Secondary | ICD-10-CM | POA: Diagnosis present

## 2019-01-21 DIAGNOSIS — I639 Cerebral infarction, unspecified: Secondary | ICD-10-CM | POA: Diagnosis present

## 2019-01-21 DIAGNOSIS — F17210 Nicotine dependence, cigarettes, uncomplicated: Secondary | ICD-10-CM | POA: Diagnosis present

## 2019-01-21 DIAGNOSIS — I63322 Cerebral infarction due to thrombosis of left anterior cerebral artery: Secondary | ICD-10-CM | POA: Diagnosis not present

## 2019-01-21 DIAGNOSIS — G459 Transient cerebral ischemic attack, unspecified: Secondary | ICD-10-CM | POA: Diagnosis present

## 2019-01-21 DIAGNOSIS — Z8249 Family history of ischemic heart disease and other diseases of the circulatory system: Secondary | ICD-10-CM | POA: Diagnosis not present

## 2019-01-21 DIAGNOSIS — E876 Hypokalemia: Secondary | ICD-10-CM | POA: Diagnosis present

## 2019-01-21 DIAGNOSIS — I10 Essential (primary) hypertension: Secondary | ICD-10-CM | POA: Diagnosis present

## 2019-01-21 DIAGNOSIS — Z20828 Contact with and (suspected) exposure to other viral communicable diseases: Secondary | ICD-10-CM | POA: Diagnosis present

## 2019-01-21 DIAGNOSIS — E785 Hyperlipidemia, unspecified: Secondary | ICD-10-CM | POA: Diagnosis present

## 2019-01-21 DIAGNOSIS — I679 Cerebrovascular disease, unspecified: Secondary | ICD-10-CM | POA: Diagnosis not present

## 2019-01-21 DIAGNOSIS — Z7902 Long term (current) use of antithrombotics/antiplatelets: Secondary | ICD-10-CM | POA: Diagnosis not present

## 2019-01-21 DIAGNOSIS — I4510 Unspecified right bundle-branch block: Secondary | ICD-10-CM | POA: Diagnosis present

## 2019-01-21 DIAGNOSIS — G8101 Flaccid hemiplegia affecting right dominant side: Secondary | ICD-10-CM | POA: Diagnosis present

## 2019-01-21 DIAGNOSIS — Z8673 Personal history of transient ischemic attack (TIA), and cerebral infarction without residual deficits: Secondary | ICD-10-CM | POA: Diagnosis not present

## 2019-01-21 LAB — LIPID PANEL
Cholesterol: 162 mg/dL (ref 0–200)
HDL: 34 mg/dL — ABNORMAL LOW (ref >40)
LDL Cholesterol: 105 mg/dL — ABNORMAL HIGH (ref 0–99)
Total CHOL/HDL Ratio: 4.8 RATIO
Triglycerides: 115 mg/dL (ref <150)
VLDL: 23 mg/dL (ref 0–40)

## 2019-01-21 LAB — HEMOGLOBIN A1C
Hgb A1c MFr Bld: 5.7 % — ABNORMAL HIGH (ref 4.8–5.6)
Mean Plasma Glucose: 116.89 mg/dL

## 2019-01-21 LAB — ECHOCARDIOGRAM COMPLETE
Height: 66 in
Weight: 2783.09 oz

## 2019-01-21 MED ORDER — HYDRALAZINE HCL 20 MG/ML IJ SOLN: 10.0000 mg | Freq: Four times a day (QID) | INTRAMUSCULAR | Status: DC | PRN

## 2019-01-21 MED ORDER — MELATONIN 3 MG PO TABS
6.0000 mg | ORAL_TABLET | Freq: Every day | ORAL | Status: DC
  Administered 2019-01-21: 6 mg via ORAL
  Filled 2019-01-21 (×3): qty 2

## 2019-01-21 MED ORDER — POTASSIUM CHLORIDE CRYS ER 20 MEQ PO TBCR
20.0000 meq | EXTENDED_RELEASE_TABLET | Freq: Two times a day (BID) | ORAL | Status: AC
  Administered 2019-01-21 (×2): 20 meq via ORAL
  Filled 2019-01-21 (×2): qty 1

## 2019-01-21 MED ORDER — LISINOPRIL 20 MG PO TABS
20.0000 mg | ORAL_TABLET | Freq: Every day | ORAL | Status: DC
  Administered 2019-01-21 – 2019-01-22 (×2): 20 mg via ORAL
  Filled 2019-01-21 (×2): qty 1

## 2019-01-21 MED ORDER — ATORVASTATIN CALCIUM 40 MG PO TABS
40.0000 mg | ORAL_TABLET | Freq: Every day | ORAL | Status: DC
  Administered 2019-01-21: 40 mg via ORAL
  Filled 2019-01-21: qty 1

## 2019-01-21 MED ORDER — ASPIRIN EC 81 MG PO TBEC
81.0000 mg | DELAYED_RELEASE_TABLET | Freq: Every day | ORAL | Status: DC
  Administered 2019-01-21 – 2019-01-22 (×2): 81 mg via ORAL
  Filled 2019-01-21 (×2): qty 1

## 2019-01-21 MED ORDER — CITALOPRAM HYDROBROMIDE 20 MG PO TABS
20.0000 mg | ORAL_TABLET | Freq: Every day | ORAL | Status: DC
  Administered 2019-01-21 – 2019-01-22 (×2): 20 mg via ORAL
  Filled 2019-01-21: qty 1
  Filled 2019-01-21 (×2): qty 2
  Filled 2019-01-21: qty 1

## 2019-01-21 MED ORDER — METHOTREXATE 2.5 MG PO TABS
12.5000 mg | ORAL_TABLET | ORAL | Status: DC
  Administered 2019-01-21: 09:00:00 12.5 mg via ORAL
  Filled 2019-01-21: qty 5

## 2019-01-21 MED ORDER — LABETALOL HCL 5 MG/ML IV SOLN: 5.0000 mg | INTRAVENOUS | Status: AC | PRN

## 2019-01-21 NOTE — Evaluation (Signed)
Occupational Therapy Evaluation Patient Details Name: Victoria Gutierrez MRN: [id number] DOB: 1945-09-25 Today's Date: 01/21/2019    History of Present Illness Patient is a 73 y/o female presenting to the ED on 7/25/020 with primary complaints of R sided weakness. PMH significant for HTN, CVA. MRI showing Multiple small foci of acute ischemia within the left anterior cerebral artery territory. No hemorrhage or mass effect.    Clinical Impression   Pt admitted with above. She demonstrates the below listed deficits and will benefit from continued OT to maximize safety and independence with BADLs.  OT eval limited to EOB and bed level activity due to elevated BB 201/128.   She indicates she is close to baseline.  She scored 2/28 on the short blessed test - missing one recall detail.  She, though has a flat affect and requires increased time to process info (HOH could be contributing).   She was able to perform bed mobility with supervision, and grooming activities with supervision.  Bil. UE strength WFL, and no obvious visual deficits.   Anticipate she will require min A or less with ADLs, but unable to formally assess due to HTN.  She reports she lives with her daughter, who works during the day, her grand daughter.  She reports she was fully independent PTA.  Will continue to follow acutely.       Follow Up Recommendations  No OT follow up;Supervision - Intermittent    Equipment Recommendations  None recommended by OT    Recommendations for Other Services       Precautions / Restrictions Precautions Precautions: Fall Restrictions Weight Bearing Restrictions: No      Mobility Bed Mobility Overal bed mobility: Needs Assistance Bed Mobility: Sit to Supine;Supine to Sit     Supine to sit: Supervision Sit to supine: Supervision   General bed mobility comments: for safety  Transfers                 General transfer comment: deferred; patient only coming to EOB - BP elevated; MD  in room    Balance Overall balance assessment: Needs assistance Sitting-balance support: No upper extremity supported;Feet supported Sitting balance-Leahy Scale: Fair                                     ADL either performed or assessed with clinical judgement   ADL Overall ADL's : Needs assistance/impaired Eating/Feeding: Independent Eating/Feeding Details (indicate cue type and reason): Pt reports she was able to feed self with Rt UE today and no drops  Grooming: Wash/dry hands;Wash/dry face;Oral care;Brushing hair;Set up;Sitting   Upper Body Bathing: Supervision/ safety;Set up;Sitting                             General ADL Comments: Limited eval due to BP 201/128 while seated EOB      Vision Baseline Vision/History: No visual deficits Patient Visual Report: No change from baseline Vision Assessment?: No apparent visual deficits Additional Comments: pursuites WFL, fields [facility]      Perception Perception Perception Tested?: Yes   Praxis Praxis Praxis tested?: Within functional limits    Pertinent Vitals/Pain       Hand Dominance Right   Extremity/Trunk Assessment Upper Extremity Assessment Upper Extremity Assessment: Overall WFL for tasks assessed   Lower Extremity Assessment Lower Extremity Assessment: Defer to PT evaluation   Cervical / Trunk  Assessment Cervical / Trunk Assessment: Normal   Communication Communication Communication: HOH   Cognition Arousal/Alertness: Awake/alert Behavior During Therapy: Flat affect Overall Cognitive Status: Within Functional Limits for tasks assessed                                 General Comments: Pt WFL for basic tasks.  Short Blessed Test administered with pt scoring 02/28 - she was unable to recall one piece of info provided.  patient HOH - difficult to assess full processing; when asked about her symptoms she denied weakness but then stated she couldn't move her R side    General Comments  Dr. Erlinda Hong in room during PT/OT eval - aware of elevated BP - advised to only do bed level eval    Exercises     Shoulder Instructions      Home Living Family/patient expects to be discharged to:: Private residence Living Arrangements: Children(daughter and grandchildren) Available Help at Discharge: Family;Available PRN/intermittently Type of Home: House Home Access: Stairs to enter [employer] of Steps: 3 Entrance Stairs-Rails: Left;Right Home Layout: One level     Bathroom Shower/Tub: Occupational psychologist: Handicapped height     Home Equipment: None          Prior Functioning/Environment Level of Independence: Independent        Comments: drives        OT Problem List: Decreased activity tolerance;Decreased cognition;Decreased safety awareness      OT Treatment/Interventions: Self-care/ADL training;DME and/or AE instruction;Therapeutic activities;Cognitive remediation/compensation;Patient/family education;Balance training    OT Goals(Current goals can be found in the care plan section) Acute Rehab OT Goals Patient Stated Goal: I need to go home OT Goal Formulation: With patient Time For Goal Achievement: 02/04/19 Potential to Achieve Goals: Good ADL Goals Pt Will Perform Grooming: with modified independence;standing Pt Will Perform Upper Body Bathing: with modified independence;sitting;standing Pt Will Perform Lower Body Bathing: with modified independence;sit to/from stand Pt Will Perform Upper Body Dressing: with modified independence;sitting Pt Will Perform Lower Body Dressing: with modified independence;sit to/from stand Pt Will Transfer to Toilet: with modified independence;ambulating;regular height toilet;grab bars Pt Will Perform Toileting - Clothing Manipulation and hygiene: with modified independence;sit to/from stand Pt Will Perform Tub/Shower Transfer: Shower transfer;with modified  independence;ambulating Additional ADL Goal #1: Pt will perform moderately complex path finding activity with min cues  OT Frequency: Min 2X/week   Barriers to D/C:            Co-evaluation PT/OT/SLP Co-Evaluation/Treatment: Yes Reason for Co-Treatment: For patient/therapist safety PT goals addressed during session: Mobility/safety with mobility;Balance OT goals addressed during session: ADL's and self-care      AM-PAC OT "6 Clicks" Daily Activity     Outcome Measure Help from another person eating meals?: None Help from another person taking care of personal grooming?: A Little Help from another person toileting, which includes using toliet, bedpan, or urinal?: A Little Help from another person bathing (including washing, rinsing, drying)?: A Lot Help from another person to put on and taking off regular upper body clothing?: A Little Help from another person to put on and taking off regular lower body clothing?: Total 6 Click Score: 16   End of Session Nurse Communication: Mobility status  Activity Tolerance: Patient tolerated treatment well Patient left: in bed;with call bell/phone within reach;with bed alarm set  OT Visit Diagnosis: Unsteadiness on feet (R26.81);Cognitive communication deficit (R41.841) Symptoms and signs involving  cognitive functions: Cerebral infarction                Time: 1315-1340 OT Time Calculation (min): 25 min Charges:  OT General Charges $OT Visit: 1 Visit OT Evaluation $OT Eval Moderate Complexity: 1 Mod  Lucille Passy, OTR/L Acute Rehabilitation Services Pager [phone] Office [phone]   Lucille Passy M 01/21/2019, 2:51 PM

## 2019-01-21 NOTE — Progress Notes (Signed)
MD aware of pt BP and stated he will order PRN antihypertensive with parameters  Informed pt on plan of care

## 2019-01-21 NOTE — Progress Notes (Signed)
Secure chat sent to MD regarding pt elevated blood pressure Pt denies pain at this time  Pt sitting at edge of bed  Will continue to monitor

## 2019-01-21 NOTE — Progress Notes (Signed)
PROGRESS NOTE    Victoria Gutierrez  ZOX:[id number] DOB: 19-Jun-1946 DOA: 01/20/2019 PCP: [place] Pink, MD   Brief Narrative: Victoria Gutierrez is a 73 y.o. female with medical history significant of HTN, stroke, CVD. Patient presented with secondary to right sided weakness. Patient found to have multiple left sided infarcts.   Assessment & Plan:   Principal Problem:   TIA (transient ischemic attack) Active Problems:   Cerebral vascular disease   Hyperlipidemia   Hypertension   Psoriasis, unspecified   Stroke Multiple left ACA territory infarcts. Possibly embolic vs large vessel per neurology assessment. Patient's symptoms resolved. CT head and neck significant for chronic proximal left M1 occlusion and high grade stenosis of dominant right vertebral artery. Transthoracic Echocardiogram significant for no embolic source. LDL of 105, hemoglobin A1C of 5.7%. Started on aspirin and Plavix with plans. -Neurology recommendations: outpatient 30 day cardiac event monitor, DAPT x3 months, then aspirin alone  Essential hypertension Significantly uncontrolled. Permissive hypertension with recommendations for BP <220/120. -Continue Lisinopril and Hydralazine IV prn  Hyperlipidemia LDL elevated at 105. -Atorvastatin 40 mg daily  Psoriasis -Continue methotrexate  Tobacco abuse -Smoking cessation discussed   DVT prophylaxis: Lovenox Code Status:   Code Status: Full Code Family Communication: None Disposition Plan: Discharge in 24 hours pending improvement of blood pressure   Consultants:   Neurology  Procedures:   Transthoracic Echocardiogram (01/21/2019) IMPRESSIONS    1. The left ventricle has a visually estimated ejection fraction of 55%. The cavity size was normal. There is mildly increased left ventricular wall thickness. Left ventricular diastolic Doppler parameters are consistent with impaired relaxation.  2. The right ventricle has normal systolic function. The cavity  was normal. There is no increase in right ventricular wall thickness. Right ventricular systolic pressure could not be assessed.  3. The aortic valve is tricuspid. Mild aortic annular calcification noted.  4. The mitral valve is grossly normal. Mild calcification of the mitral valve leaflet.  5. The tricuspid valve is grossly normal.  6. The aorta is normal in size and structure.  Antimicrobials:  None    Subjective: No issues. Symptoms resolved.  Objective: Vitals:   01/21/19 0600 01/21/19 0759 01/21/19 1206 01/21/19 1538  BP: (!) 176/87 (!) 188/111 (!) 210/126 (!) 212/109  Pulse: 72 76 80 73  Resp: 20 16 17    Temp: 97.7 F (36.5 C)  98 F (36.7 C)   TempSrc: Oral Oral Oral   SpO2: 95% 95% 97%   Weight:      Height:        Intake/Output Summary (Last 24 hours) at 01/21/2019 1628 Last data filed at 01/21/2019 1555 Gross per 24 hour  Intake 760 ml  Output --  Net 760 ml   Filed Weights   01/20/19 2000 01/20/19 2048 01/21/19 0131  Weight: 79.9 kg 79.9 kg 78.9 kg    Examination:  General exam: Appears calm and comfortable Respiratory system: Clear to auscultation. Respiratory effort normal. Cardiovascular system: S1 & S2 heard, RRR. No murmurs, rubs, gallops or clicks. Gastrointestinal system: Abdomen is nondistended, soft and nontender. No organomegaly or masses felt. Normal bowel sounds heard. Central nervous system: Alert and oriented. No focal neurological deficits. Extremities: No edema. No calf tenderness Skin: No cyanosis. No rashes Psychiatry: Judgement and insight appear normal. Mood & affect appropriate.     Data Reviewed: I have personally reviewed following labs and imaging studies  CBC: Recent Labs  Lab 01/20/19 2015 01/20/19 2017  WBC 11.2*  --  NEUTROABS 7.2  --   HGB 15.0 14.3  HCT 44.6 42.0  MCV 102.3*  --   PLT 223  --    Basic Metabolic Panel: Recent Labs  Lab 01/20/19 2015 01/20/19 2017  NA 142 141  K 3.6 3.3*  CL 110 109   CO2 18*  --   GLUCOSE 143* 143*  BUN 14 15  CREATININE 1.25* 1.20*  CALCIUM 8.9  --    GFR: Estimated Creatinine Clearance: 44.9 mL/min (A) (by C-G formula based on SCr of 1.2 mg/dL (H)). Liver Function Tests: Recent Labs  Lab 01/20/19 2015  AST 19  ALT 17  ALKPHOS 74  BILITOT 0.6  PROT 5.4*  ALBUMIN 3.4*   No results for input(s): LIPASE, AMYLASE in the last 168 hours. No results for input(s): AMMONIA in the last 168 hours. Coagulation Profile: Recent Labs  Lab 01/20/19 2015  INR 1.0   Cardiac Enzymes: No results for input(s): CKTOTAL, CKMB, CKMBINDEX, TROPONINI in the last 168 hours. BNP (last 3 results) No results for input(s): PROBNP in the last 8760 hours. HbA1C: Recent Labs    01/21/19 0408  HGBA1C 5.7*   CBG: No results for input(s): GLUCAP in the last 168 hours. Lipid Profile: Recent Labs    01/21/19 0408  CHOL 162  HDL 34*  LDLCALC 105*  TRIG 115  CHOLHDL 4.8   Thyroid Function Tests: No results for input(s): TSH, T4TOTAL, FREET4, T3FREE, THYROIDAB in the last 72 hours. Anemia Panel: No results for input(s): VITAMINB12, FOLATE, FERRITIN, TIBC, IRON, RETICCTPCT in the last 72 hours. Sepsis Labs: No results for input(s): PROCALCITON, LATICACIDVEN in the last 168 hours.  Recent Results (from the past 240 hour(s))  SARS Coronavirus 2 (CEPHEID - Performed in [place] hospital lab), Hosp Order     Status: None   Collection Time: 01/20/19 10:07 PM   Specimen: Nasopharyngeal Swab  Result Value Ref Range Status   SARS Coronavirus 2 NEGATIVE NEGATIVE Final    Comment: (NOTE) If result is NEGATIVE SARS-CoV-2 target nucleic acids are NOT DETECTED. The SARS-CoV-2 RNA is generally detectable in upper and lower  respiratory specimens during the acute phase of infection. The lowest  concentration of SARS-CoV-2 viral copies this assay can detect is 250  copies / mL. A negative result does not preclude SARS-CoV-2 infection  and should not be used as  the sole basis for treatment or other  patient management decisions.  A negative result may occur with  improper specimen collection / handling, submission of specimen other  than nasopharyngeal swab, presence of viral mutation(s) within the  areas targeted by this assay, and inadequate number of viral copies  (<250 copies / mL). A negative result must be combined with clinical  observations, patient history, and epidemiological information. If result is POSITIVE SARS-CoV-2 target nucleic acids are DETECTED. The SARS-CoV-2 RNA is generally detectable in upper and lower  respiratory specimens dur ing the acute phase of infection.  Positive  results are indicative of active infection with SARS-CoV-2.  Clinical  correlation with patient history and other diagnostic information is  necessary to determine patient infection status.  Positive results do  not rule out bacterial infection or co-infection with other viruses. If result is PRESUMPTIVE POSTIVE SARS-CoV-2 nucleic acids MAY BE PRESENT.   A presumptive positive result was obtained on the submitted specimen  and confirmed on repeat testing.  While 2019 novel coronavirus  (SARS-CoV-2) nucleic acids may be present in the submitted sample  additional confirmatory testing  may be necessary for epidemiological  and / or clinical management purposes  to differentiate between  SARS-CoV-2 and other Sarbecovirus currently known to infect humans.  If clinically indicated additional testing with an alternate test  methodology [phone]) is advised. The SARS-CoV-2 RNA is generally  detectable in upper and lower respiratory sp ecimens during the acute  phase of infection. The expected result is Negative. Fact Sheet for Patients:  StrictlyIdeas.no Fact Sheet for Healthcare Providers: BankingDealers.co.za This test is not yet approved or cleared by the [place] FDA and has been authorized for  detection and/or diagnosis of SARS-CoV-2 by FDA under an Emergency Use Authorization (EUA).  This EUA will remain in effect (meaning this test can be used) for the duration of the COVID-19 declaration under Section 564(b)(1) of the Act, 21 U.S.C. section 360bbb-3(b)(1), unless the authorization is terminated or revoked sooner. Performed at [facility], [address], [place], [place] [address]          Radiology Studies: Ct Angio Head W Or Wo Contrast  Result Date: 01/20/2019 CLINICAL DATA:  Code stroke.  Right-sided weakness. EXAM: CT ANGIOGRAPHY HEAD AND NECK TECHNIQUE: Multidetector CT imaging of the head and neck was performed using the standard protocol during bolus administration of intravenous contrast. Multiplanar CT image reconstructions and MIPs were obtained to evaluate the vascular anatomy. Carotid stenosis measurements (when applicable) are obtained utilizing NASCET criteria, using the distal internal carotid diameter as the denominator. CONTRAST:  99mL OMNIPAQUE IOHEXOL 350 MG/ML SOLN COMPARISON:  CT head without contrast 01/20/2019. MRI brain 04/08/2017 FINDINGS: CTA NECK FINDINGS Aortic arch: Atherosclerotic calcifications are present at the origins the great vessels. There is no significant stenosis of greater than 50% relative to the more distal vessels. Calcifications are greatest at the subclavian artery. There is no aneurysm. Right carotid system: The right common carotid artery is within normal limits. Atherosclerotic calcifications are present along the medial aspect of the proximal right ICA. The minimal luminal diameter is 3 mm. The more distal right ICA is within normal limits. Left carotid system: The left common carotid artery is within normal limits. Dense calcifications are present without a significant stenosis relative to the more distal vessel. The more distal left ICA is normal. Vertebral arteries: Dense calcifications are present at the origin of the  dominant right vertebral artery. There is a high-grade stenosis. There is no stenosis at the non dominant left vertebral artery origin. There is a moderate tandem stenosis at the entry into the spinal canal at C6 on the right. The more distal right vertebral artery is within normal limits. Calcifications are present at C1 without significant stenosis. The left vertebral artery is hypoplastic without focal stenosis in the neck. Skeleton: Multilevel degenerative changes are present in the cervical spine. Endplate changes are most pronounced at C5-6 and C6-7 with osseous foraminal narrowing bilaterally. No focal lytic or blastic lesions are present. Advanced facet hypertrophy is present at C2-3 and C3-4. Other neck: No focal mucosal or submucosal lesions are present. A dominant right thyroid nodule measures 2.1 x 1.3 x 1.6 cm. No significant adenopathy is present. The salivary glands are within normal limits. Upper chest: Mild dependent atelectasis is present. Lung apices are otherwise clear without focal nodule, mass, or airspace disease. The upper mediastinum is clear. Review of the MIP images confirms the above findings CTA HEAD FINDINGS Anterior circulation: Dense atherosclerotic calcifications are present throughout the cavernous internal carotid arteries bilaterally without significant stenosis relative to the more distal vessel.  ICA termini are within normal limits. Right M1 segment is normal. Left A1 segment is dominant. The anterior communicating artery is patent. There is chronic proximal left MCA occlusion with collateral flow to left MCA branches. Right MCA bifurcation is intact. There is some attenuation of distal right MCA and bilateral distal ACA branches without a significant proximal stenosis or occlusion. Posterior circulation: The left vertebral artery terminates at the PICA. Dense atherosclerotic calcifications are present at the vertebral artery origin on the right. There is at least 50% stenosis  relative to the more distal vessel. The basilar artery is small with moderate distal stenosis. The basilar artery terminates at the superior cerebellar arteries with small P1 segments extending into fetal type posterior cerebral arteries bilaterally. There is mild narrowing of the proximal P2 segments bilaterally without a significant stenosis or occlusion. There is some attenuation of distal PCA branch vessels bilaterally. Venous sinuses: The dural sinuses are patent. The right transverse sinus is dominant. The straight sinus and deep cerebral veins are intact. Cortical veins are within normal limits. Anatomic variants: Fetal type posterior cerebral arteries bilaterally. Review of the MIP images confirms the above findings IMPRESSION: 1. 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 2. 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 3. Atherosclerotic changes of the proximal internal carotid arteries bilaterally without significant stenosis relative to the more distal vessels. 4. 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 5. Tandem stenosis of the right vertebral artery at the C6 level. 6. Dominant right thyroid nodule. This was noted on previous nuclear medicine study. Consider further evaluation with thyroid ultrasound. If patient is clinically hyperthyroid, consider nuclear medicine thyroid uptake and scan. 7. Atherosclerotic changes within the cavernous internal carotid arteries bilaterally without a significant stenosis. 8. Collateral flow to left MCA branches. 9. Hypoplastic basilar artery with distal narrowing. Fetal type posterior cerebral arteries are present. Electronically Signed   By: San Morelle M.D.   On: 01/20/2019 20:55   Dg Chest 2 View  Result Date: 01/20/2019 CLINICAL DATA:  73 year old female with right-sided weakness. EXAM: CHEST - 2 VIEW COMPARISON:  Chest radiograph dated 12/30/2014 FINDINGS: The lungs are clear. There is no  pleural effusion or pneumothorax. The cardiac silhouette is within normal limits. No acute osseous pathology. Degenerative changes of the spine. IMPRESSION: No active cardiopulmonary disease. Electronically Signed   By: Anner Crete M.D.   On: 01/20/2019 22:43   Ct Angio Neck W Or Wo Contrast  Result Date: 01/20/2019 CLINICAL DATA:  Code stroke.  Right-sided weakness. EXAM: CT ANGIOGRAPHY HEAD AND NECK TECHNIQUE: Multidetector CT imaging of the head and neck was performed using the standard protocol during bolus administration of intravenous contrast. Multiplanar CT image reconstructions and MIPs were obtained to evaluate the vascular anatomy. Carotid stenosis measurements (when applicable) are obtained utilizing NASCET criteria, using the distal internal carotid diameter as the denominator. CONTRAST:  15mL OMNIPAQUE IOHEXOL 350 MG/ML SOLN COMPARISON:  CT head without contrast 01/20/2019. MRI brain 04/08/2017 FINDINGS: CTA NECK FINDINGS Aortic arch: Atherosclerotic calcifications are present at the origins the great vessels. There is no significant stenosis of greater than 50% relative to the more distal vessels. Calcifications are greatest at the subclavian artery. There is no aneurysm. Right carotid system: The right common carotid artery is within normal limits. Atherosclerotic calcifications are present along the medial aspect of the proximal right ICA. The minimal luminal diameter is 3 mm. The more distal right ICA is within normal limits.  Left carotid system: The left common carotid artery is within normal limits. Dense calcifications are present without a significant stenosis relative to the more distal vessel. The more distal left ICA is normal. Vertebral arteries: Dense calcifications are present at the origin of the dominant right vertebral artery. There is a high-grade stenosis. There is no stenosis at the non dominant left vertebral artery origin. There is a moderate tandem stenosis at the entry  into the spinal canal at C6 on the right. The more distal right vertebral artery is within normal limits. Calcifications are present at C1 without significant stenosis. The left vertebral artery is hypoplastic without focal stenosis in the neck. Skeleton: Multilevel degenerative changes are present in the cervical spine. Endplate changes are most pronounced at C5-6 and C6-7 with osseous foraminal narrowing bilaterally. No focal lytic or blastic lesions are present. Advanced facet hypertrophy is present at C2-3 and C3-4. Other neck: No focal mucosal or submucosal lesions are present. A dominant right thyroid nodule measures 2.1 x 1.3 x 1.6 cm. No significant adenopathy is present. The salivary glands are within normal limits. Upper chest: Mild dependent atelectasis is present. Lung apices are otherwise clear without focal nodule, mass, or airspace disease. The upper mediastinum is clear. Review of the MIP images confirms the above findings CTA HEAD FINDINGS Anterior circulation: Dense atherosclerotic calcifications are present throughout the cavernous internal carotid arteries bilaterally without significant stenosis relative to the more distal vessel. ICA termini are within normal limits. Right M1 segment is normal. Left A1 segment is dominant. The anterior communicating artery is patent. There is chronic proximal left MCA occlusion with collateral flow to left MCA branches. Right MCA bifurcation is intact. There is some attenuation of distal right MCA and bilateral distal ACA branches without a significant proximal stenosis or occlusion. Posterior circulation: The left vertebral artery terminates at the PICA. Dense atherosclerotic calcifications are present at the vertebral artery origin on the right. There is at least 50% stenosis relative to the more distal vessel. The basilar artery is small with moderate distal stenosis. The basilar artery terminates at the superior cerebellar arteries with small P1 segments  extending into fetal type posterior cerebral arteries bilaterally. There is mild narrowing of the proximal P2 segments bilaterally without a significant stenosis or occlusion. There is some attenuation of distal PCA branch vessels bilaterally. Venous sinuses: The dural sinuses are patent. The right transverse sinus is dominant. The straight sinus and deep cerebral veins are intact. Cortical veins are within normal limits. Anatomic variants: Fetal type posterior cerebral arteries bilaterally. Review of the MIP images confirms the above findings IMPRESSION: 1. 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 2. 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 3. Atherosclerotic changes of the proximal internal carotid arteries bilaterally without significant stenosis relative to the more distal vessels. 4. 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 5. Tandem stenosis of the right vertebral artery at the C6 level. 6. Dominant right thyroid nodule. This was noted on previous nuclear medicine study. Consider further evaluation with thyroid ultrasound. If patient is clinically hyperthyroid, consider nuclear medicine thyroid uptake and scan. 7. Atherosclerotic changes within the cavernous internal carotid arteries bilaterally without a significant stenosis. 8. Collateral flow to left MCA branches. 9. Hypoplastic basilar artery with distal narrowing. Fetal type posterior cerebral arteries are present. Electronically Signed   By: San Morelle M.D.   On: 01/20/2019 20:55   Mr Brain Wo Contrast  Result Date: 01/20/2019  CLINICAL DATA:  Right-sided weakness. EXAM: MRI HEAD WITHOUT CONTRAST TECHNIQUE: Multiplanar, multiecho pulse sequences of the brain and surrounding structures were obtained without intravenous contrast. COMPARISON:  Brain MRI 04/08/2017 FINDINGS: BRAIN: There are punctate areas of abnormal diffusion restriction within paramedian superior left  hemisphere, within the frontal lobe. These are in the distribution of the anterior cerebral artery. Early confluent hyperintense T2-weighted signal of the periventricular and deep white matter, most commonly due to chronic ischemic microangiopathy. The cerebral and cerebellar volume are age-appropriate. There is no hydrocephalus. The midline structures are normal. There are multiple old small vessel infarcts of the periventricular white matter. VASCULAR: Susceptibility-sensitive sequences show no chronic microhemorrhage or superficial siderosis. The major intracranial arterial and venous sinus flow voids are normal. SKULL AND UPPER CERVICAL SPINE: Calvarial bone marrow signal is normal. There is no skull base mass. The visualized upper cervical spine and soft tissues are normal. SINUSES/ORBITS: Maxillary retention cyst. No mastoid or middle ear effusion. The orbits are normal. IMPRESSION: 1. Multiple small foci of acute ischemia within the left anterior cerebral artery territory. No hemorrhage or mass effect. 2. Severe chronic ischemic microangiopathy. Electronically Signed   By: Ulyses Jarred M.D.   On: 01/20/2019 23:52   Ct Head Code Stroke Wo Contrast  Result Date: 01/20/2019 CLINICAL DATA:  Code stroke.  Right-sided weakness EXAM: CT HEAD WITHOUT CONTRAST TECHNIQUE: Contiguous axial images were obtained from the base of the skull through the vertex without intravenous contrast. COMPARISON:  MRI brain 04/08/2017. CT head without contrast 08/12/2011 FINDINGS: Brain: Remote lacunar infarcts are again noted at the left caudate head and extending into the corona radiata. Extensive periventricular white matter hypoattenuation is noted bilaterally. No acute infarct, hemorrhage, or mass lesion is present. Basal ganglia are unchanged. Insular ribbon is intact. No focal cortical lesion is present. The brainstem and cerebellum are within normal limits. The ventricles are of normal size. There is some ex vacuo dilation  of the left lateral ventricle. No significant extraaxial fluid collection is present. Vascular: Atherosclerotic calcifications are present within the cavernous internal carotid arteries bilaterally and at the dural margin of the right vertebral artery. There is no hyperdense vessel. Skull: Calvarium is intact. No focal lytic or blastic lesions are present. Sinuses/Orbits: A large polyp or mucous retention cyst is again noted within the left maxillary sinus. Mild mucosal thickening is present in the maxillary sinuses bilaterally. The paranasal sinuses and mastoid air cells are otherwise clear. The globes and orbits are within normal limits. ASPECTS [facility] Stroke Program Early CT Score) - Ganglionic level infarction (caudate, lentiform nuclei, internal capsule, insula, M1-M3 cortex): 7/7 - Supraganglionic infarction (M4-M6 cortex): 3/3 Total score (0-10 with 10 being normal): 10/10 IMPRESSION: 1. No acute intracranial abnormality or significant interval change. 2. Remote lacunar infarcts involving the left caudate head and centrum semi ovale. 3. Advanced atrophy and diffuse white matter disease is otherwise stable. 4. ASPECTS is 10/10 The above was relayed via text pager to Dr. Leonel Ramsay IS on 01/20/2019 at 20:24 . Electronically Signed   By: San Morelle M.D.   On: 01/20/2019 20:25        Scheduled Meds:  aspirin EC  81 mg Oral Daily   atorvastatin  40 mg Oral q1800   citalopram  20 mg Oral Daily   clopidogrel  75 mg Oral Daily   enoxaparin (LOVENOX) injection  40 mg Subcutaneous Q24H   lisinopril  20 mg Oral Daily   Melatonin  6 mg Oral QHS   methotrexate  12.5 mg Oral Q Sun   potassium chloride  20 mEq Oral BID   Continuous Infusions:   LOS: 0 days     Cordelia Poche, MD [facility] 01/21/2019, 4:28 PM  If 7PM-7AM, please contact night-coverage [email or web address]

## 2019-01-21 NOTE — Progress Notes (Signed)
MD Erlinda Hong on the unit, informed of pt elevated blood pressure MD aware and stated he will order antihypertensive orally for pt

## 2019-01-21 NOTE — Progress Notes (Addendum)
STROKE TEAM PROGRESS NOTE   SUBJECTIVE (INTERVAL HISTORY) Her PTs are at the bedside.  Pt BP high on lying and sitting, not able to perform PT today. Her BP 210/126, started PRN hydralazine and resume lisinopril home meds.   OBJECTIVE Vitals:   01/21/19 0400 01/21/19 0600 01/21/19 0759 01/21/19 1206  BP: (!) 197/102 (!) 176/87 (!) 188/111 (!) 210/126  Pulse: 79 72 76 80  Resp: 18 20 16 17   Temp: 98.4 F (36.9 C) 97.7 F (36.5 C)  98 F (36.7 C)  TempSrc: Oral Oral Oral Oral  SpO2: 96% 95% 95% 97%  Weight:      Height:        CBC:  Recent Labs  Lab 01/20/19 2015 01/20/19 2017  WBC 11.2*  --   NEUTROABS 7.2  --   HGB 15.0 14.3  HCT 44.6 42.0  MCV 102.3*  --   PLT 223  --     Basic Metabolic Panel:  Recent Labs  Lab 01/20/19 2015 01/20/19 2017  NA 142 141  K 3.6 3.3*  CL 110 109  CO2 18*  --   GLUCOSE 143* 143*  BUN 14 15  CREATININE 1.25* 1.20*  CALCIUM 8.9  --     Lipid Panel:     Component Value Date/Time   CHOL 162 01/21/2019 0408   TRIG 115 01/21/2019 0408   HDL 34 (L) 01/21/2019 0408   CHOLHDL 4.8 01/21/2019 0408   VLDL 23 01/21/2019 0408   LDLCALC 105 (H) 01/21/2019 0408   HgbA1c:  Lab Results  Component Value Date   HGBA1C 5.7 (H) 01/21/2019   Urine Drug Screen:     Component Value Date/Time   LABOPIA NONE DETECTED 01/20/2019 2111   COCAINSCRNUR NONE DETECTED 01/20/2019 2111   LABBENZ NONE DETECTED 01/20/2019 2111   AMPHETMU NONE DETECTED 01/20/2019 2111   THCU POSITIVE (A) 01/20/2019 2111   LABBARB NONE DETECTED 01/20/2019 2111    Alcohol Level     Component Value Date/Time   ETH <10 01/20/2019 2015    IMAGING  Ct Angio Head W Or Wo Contrast Ct Angio Neck W Or Wo Contrast 01/20/2019 IMPRESSION:  1. 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  2. 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  3. Atherosclerotic changes of the proximal internal carotid arteries bilaterally without significant  stenosis relative to the more distal vessels.  4. 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  5. Tandem stenosis of the right vertebral artery at the C6 level.  6. Dominant right thyroid nodule. This was noted on previous nuclear medicine study. Consider further evaluation with thyroid ultrasound. If patient is clinically hyperthyroid, consider nuclear medicine thyroid uptake and scan.  7. Atherosclerotic changes within the cavernous internal carotid arteries bilaterally without a significant stenosis.  8. Collateral flow to left MCA branches.  9. Hypoplastic basilar artery with distal narrowing. Fetal type posterior cerebral arteries are present.     Dg Chest 2 View 01/20/2019 IMPRESSION:  No active cardiopulmonary disease.    Mr Brain Wo Contrast 01/20/2019 IMPRESSION:  1. Multiple small foci of acute ischemia within the left anterior cerebral artery territory. No hemorrhage or mass effect.  2. Severe chronic ischemic microangiopathy.    Ct Head Code Stroke Wo Contrast 01/20/2019 IMPRESSION:  1. No acute intracranial abnormality or significant interval change.  2. Remote lacunar infarcts involving the left caudate head and centrum semi ovale.  3. Advanced atrophy and diffuse  white matter disease is otherwise stable.  4. ASPECTS is 10/10   Transthoracic Echocardiogram  Pending   ECG - SR rate 87 BPM - RBBB - Old inferior Mi   PHYSICAL EXAM  Temp:  [97.7 F (36.5 C)-98.4 F (36.9 C)] 98 F (36.7 C) (07/26 1206) Pulse Rate:  [72-86] 80 (07/26 1206) Resp:  [15-24] 17 (07/26 1206) BP: (176-218)/(87-128) 210/126 (07/26 1206) SpO2:  [95 %-100 %] 97 % (07/26 1206) Weight:  [78.9 kg-79.9 kg] 78.9 kg (07/26 0131)  General - Well nourished, well developed, in no apparent distress.  Ophthalmologic - fundi not visualized due to noncooperation.  Cardiovascular - Regular rate and rhythm.  Mental Status -  Level of arousal and orientation to  time, place, and person were intact. Language including expression, naming, repetition, comprehension was assessed and found intact. Fund of Knowledge was assessed and was intact.  Cranial Nerves II - XII - II - Visual field intact OU. III, IV, VI - Extraocular movements intact. V - Facial sensation intact bilaterally. VII - Facial movement intact bilaterally. VIII - Hearing & vestibular intact bilaterally. X - Palate elevates symmetrically. XI - Chin turning & shoulder shrug intact bilaterally. XII - Tongue protrusion intact.  Motor Strength - The patient's strength was normal in all extremities and pronator drift was absent.  Bulk was normal and fasciculations were absent.   Motor Tone - Muscle tone was assessed at the neck and appendages and was normal.  Reflexes - The patient's reflexes were symmetrical in all extremities and she had no pathological reflexes.  Sensory - Light touch, temperature/pinprick were assessed and were symmetrical.    Coordination - The patient had normal movements in the hands with no ataxia or dysmetria.  Tremor was absent.  Gait and Station - deferred.   ASSESSMENT/PLAN Ms. RONNITA PAZ is a 73 y.o. female with history of a TIA / stroke, ongoing tobacco use, and hypertension presenting with right sided weakness that started improving enroute. She did not receive IV t-PA due to improving / mild deficits.  Stroke: multiple small left ACA territory infarcts - possibly large vessel disease from left ICA bulb atherosclerosis but can not rule out cardioembolic source   Resultant  Back to baseline  CT head  - no acute findings. Remote lacunar infarcts involving the left caudate head and centrum semiovale.   MRI head - Multiple small foci of acute ischemia within the left anterior cerebral artery territory. Severe chronic ischemic microangiopathy.   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 High-grade stenosis of the dominant  right vertebral artery at its origin. Tandem stenosis of the right vertebral artery at the C6 level. B/l dense calcification at ICA bulbs.  2D Echo - pending  Recommend 30 day cardiac event monitoring as outpt to rule out afib  [employer] Virus 2 - negative  LDL - 105  HgbA1c - 5.7  UDS - THCU  VTE prophylaxis - Lovenox  Diet  - Heart healthy with thin liquids.  No antithrombotic prior to admission, now on aspirin 81 mg daily and clopidogrel 75 mg daily. Continue DAPT for 3 months given multifocal intracranial stenosis/occlusion and then ASA alone.  Patient counseled to be compliant with her antithrombotic medications  Ongoing aggressive stroke risk factor management  Therapy recommendations:  pending  Disposition:  Pending  Intra- and extracranial stenosis  2012 MRI showed left M1 patent, 2018 MRI showed left M1 occlusion  This admission left M1 occlusion chronic  CTA head and neck also showed right VA tandem stenosis, hypoplastic BA, right dominant VA origine high grade stenosis  CTA neck also showed dense calcification b/l ICA bulbs. Given her left MCA chronic occlusion and this time left ACA event, concerning for left ICA bulb atherosclerosis.  On DAPT for 3 months, consider VVS follow up as outpt.   Hx stroke/TIA   2012 MRI showed left MCA scattered infarcts  Left M1 still patent on MRI  Not on antiplatelet at home  Hypertension  Blood pressure high and DBP > 120 . Permissive hypertension (OK if < 220/120) but gradually normalize in 3-5 days . Add hydralazine IV PRN given bradycardia . Resume lisinopril home meds . Hold off HCTZ now given elevated Cre . Long-term BP goal 130-150 given chronic left M1 occlusion  Hyperlipidemia  Lipid lowering medication PTA:  none  LDL 105, goal < 70  Current lipid lowering medication: Lipitor 40 mg daily   Continue statin at discharge  Tobacco abuse  Current smoker  Smoking cessation counseling  provided  Pt is willing to quit  Other Stroke Risk Factors  Advanced age  THC abuse  Overweight, Body mass index is 28.08 kg/m., recommend weight loss, diet and exercise as appropriate   Other Active Problems  Hypokalemia - 3.3 - supplement  Hospital day # 0  Neurology will sign off. Please call with questions. Pt will follow up with stroke clinic NP at [facility] in about 4 weeks. Thanks for the consult.  Rosalin Hawking, MD PhD Stroke Neurology 01/21/2019 3:39 PM    To contact Stroke Continuity provider, please refer to [email or web address] After hours, contact General Neurology

## 2019-01-21 NOTE — Progress Notes (Signed)
  Echocardiogram 2D Echocardiogram has been performed.  Victoria Gutierrez 01/21/2019, 3:25 PM

## 2019-01-21 NOTE — Progress Notes (Signed)
Pt daughter called back, requesting to speak to MD Erlinda Hong for an update  Paged neuro PA to inform

## 2019-01-21 NOTE — Progress Notes (Signed)
Pt daughter, Amy called, [phone]  Update provided per pt request  Pt daughter also asking for an MD update  Informed MD via secure chat

## 2019-01-21 NOTE — Progress Notes (Signed)
Pt admitted from ED with the diagnosis of Stroke, alert and oriented, denies any pain at this time, settled in bed with call light within pt's reach, tele monitor put and verified on pt, was however reassured and will continue to monitor, safety measures explained and initiated accordingly, v/s stable. Obasogie-Asidi, Tiari Andringa Efe

## 2019-01-21 NOTE — Evaluation (Signed)
Physical Therapy Evaluation Patient Details Name: Victoria Gutierrez MRN: [id number] DOB: [birth year] Today's Date: 01/21/2019   History of Present Illness  Patient is a 73 y/o female presenting to the ED on 7/25/020 with primary complaints of R sided weakness. PMH significant for HTN, CVA. MRI showing Multiple small foci of acute ischemia within the left anterior cerebral artery territory. No hemorrhage or mass effect.     Clinical Impression  Patient admitted with the above. Patient reports independence at baseline for mobility and ADLs. Patient today hypertensive with MD in room and aware. PT/OT refraining from OOB mobility due to elevated BP. Patient able to come into sitting EOB with supervision with patient sitting EOB x~15 minutes with good seated balance. No subjective complaints from patient. Good strength at B LE with baseline sensory deficits due to back pain. Will continue to assess need for PT at discharge. Will follow acutely.      Follow Up Recommendations (will continue to assess when safe to mobilize based on BP)    Equipment Recommendations  (will continue to assess for need)    Recommendations for Other Services       Precautions / Restrictions Precautions Precautions: Fall Restrictions Weight Bearing Restrictions: No      Mobility  Bed Mobility Overal bed mobility: Needs Assistance Bed Mobility: Sit to Supine;Supine to Sit     Supine to sit: Supervision Sit to supine: Supervision   General bed mobility comments: for safety  Transfers                 General transfer comment: deferred; patient only coming to EOB - BP elevated; MD in room  Ambulation/Gait             General Gait Details: deferred  Stairs            Wheelchair Mobility    Modified Rankin (Stroke Patients Only) Modified Rankin (Stroke Patients Only) Pre-Morbid Rankin Score: No symptoms Modified Rankin: Moderately severe disability     Balance Overall balance  assessment: Needs assistance Sitting-balance support: No upper extremity supported;Feet supported Sitting balance-Leahy Scale: Fair                                       Pertinent Vitals/Pain      Home Living Family/patient expects to be discharged to:: Private residence Living Arrangements: Children(daughter and grandchildren) Available Help at Discharge: Family;Available PRN/intermittently Type of Home: House Home Access: Stairs to enter Entrance Stairs-Rails: Chemical engineer of Steps: 3 Home Layout: One level Home Equipment: None      Prior Function Level of Independence: Independent         Comments: drives     Hand Dominance   Dominant Hand: Right    Extremity/Trunk Assessment   Upper Extremity Assessment Upper Extremity Assessment: Defer to OT evaluation    Lower Extremity Assessment Lower Extremity Assessment: Overall WFL for tasks assessed(in sitting)    Cervical / Trunk Assessment Cervical / Trunk Assessment: Normal  Communication   Communication: HOH  Cognition Arousal/Alertness: Awake/alert Behavior During Therapy: Flat affect Overall Cognitive Status: Within Functional Limits for tasks assessed                                 General Comments: patient HOH - difficult to assess full processing; when asked about her symptoms she  denied weakness but then stated she couldn't move her R side      General Comments General comments (skin integrity, edema, etc.): Dr. Erlinda Hong in room during PT/OT eval - aware of elevated BP - advised to only do bed level eval    Exercises     Assessment/Plan    PT Assessment Patient needs continued PT services  PT Problem List Decreased strength;Decreased activity tolerance;Decreased balance;Decreased mobility;Decreased knowledge of use of DME;Decreased safety awareness       PT Treatment Interventions DME instruction;Gait training;Stair training;Functional mobility  training;Therapeutic exercise;Therapeutic activities;Balance training;Neuromuscular re-education;Patient/family education    PT Goals (Current goals can be found in the Care Plan section)  Acute Rehab PT Goals Patient Stated Goal: I need to go home PT Goal Formulation: With patient Time For Goal Achievement: 02/04/19 Potential to Achieve Goals: Good    Frequency Min 4X/week   Barriers to discharge        Co-evaluation PT/OT/SLP Co-Evaluation/Treatment: Yes Reason for Co-Treatment: Necessary to address cognition/behavior during functional activity;For patient/therapist safety;To address functional/ADL transfers PT goals addressed during session: Mobility/safety with mobility;Balance         AM-PAC PT "6 Clicks" Mobility  Outcome Measure Help needed turning from your back to your side while in a flat bed without using bedrails?: A Little Help needed moving from lying on your back to sitting on the side of a flat bed without using bedrails?: A Little Help needed moving to and from a bed to a chair (including a wheelchair)?: A Little Help needed standing up from a chair using your arms (e.g., wheelchair or bedside chair)?: A Little Help needed to walk in hospital room?: A Lot Help needed climbing 3-5 steps with a railing? : A Lot 6 Click Score: 16    End of Session   Activity Tolerance: Patient tolerated treatment well Patient left: in bed;with call bell/phone within reach;with bed alarm set Nurse Communication: Mobility status PT Visit Diagnosis: Unsteadiness on feet (R26.81);Other abnormalities of gait and mobility (R26.89);Muscle weakness (generalized) (M62.81)    Time: 1315-1340 PT Time Calculation (min) (ACUTE ONLY): 25 min   Charges:   PT Evaluation $PT Eval Moderate Complexity: 1 Mod           Lanney Gins, PT, DPT Supplemental Physical Therapist 01/21/19 2:27 PM Pager: [phone] Office: [phone]

## 2019-01-22 ENCOUNTER — Other Ambulatory Visit: Payer: Self-pay | Admitting: Cardiology

## 2019-01-22 DIAGNOSIS — I639 Cerebral infarction, unspecified: Secondary | ICD-10-CM

## 2019-01-22 LAB — BASIC METABOLIC PANEL
Anion gap: 9 (ref 5–15)
BUN: 14 mg/dL (ref 8–23)
CO2: 23 mmol/L (ref 22–32)
Calcium: 9.4 mg/dL (ref 8.9–10.3)
Chloride: 107 mmol/L (ref 98–111)
Creatinine, Ser: 1.2 mg/dL — ABNORMAL HIGH (ref 0.44–1.00)
GFR calc Af Amer: 52 mL/min — ABNORMAL LOW (ref >60)
GFR calc non Af Amer: 45 mL/min — ABNORMAL LOW (ref >60)
Glucose, Bld: 117 mg/dL — ABNORMAL HIGH (ref 70–99)
Potassium: 4.3 mmol/L (ref 3.5–5.1)
Sodium: 139 mmol/L (ref 135–145)

## 2019-01-22 MED ORDER — LISINOPRIL 40 MG PO TABS: 40.0000 mg | ORAL_TABLET | Freq: Every day | ORAL | 0 refills | Status: DC

## 2019-01-22 MED ORDER — HYDROCHLOROTHIAZIDE 12.5 MG PO CAPS
12.5000 mg | ORAL_CAPSULE | Freq: Every day | ORAL | Status: DC
  Administered 2019-01-22: 12.5 mg via ORAL
  Filled 2019-01-22: qty 1

## 2019-01-22 MED ORDER — CLOPIDOGREL BISULFATE 75 MG PO TABS: 75.0000 mg | ORAL_TABLET | Freq: Every day | ORAL | 2 refills | Status: AC

## 2019-01-22 MED ORDER — ATORVASTATIN CALCIUM 40 MG PO TABS: 40.0000 mg | ORAL_TABLET | Freq: Every day | ORAL | 0 refills | Status: DC

## 2019-01-22 MED ORDER — ASPIRIN 81 MG PO TBEC: 81.0000 mg | DELAYED_RELEASE_TABLET | Freq: Every day | ORAL | 0 refills | Status: DC

## 2019-01-22 MED ORDER — LISINOPRIL 20 MG PO TABS
20.0000 mg | ORAL_TABLET | Freq: Once | ORAL | Status: AC
  Administered 2019-01-22: 15:00:00 20 mg via ORAL
  Filled 2019-01-22: qty 1

## 2019-01-22 NOTE — Progress Notes (Signed)
Physical Therapy Treatment Patient Details Name: Victoria Gutierrez MRN: [id number] DOB: 10-Sep-1945 Today's Date: 01/22/2019    History of Present Illness Patient is a 73 y/o female presenting to the ED on 7/25/020 with primary complaints of R sided weakness. PMH significant for HTN, CVA. MRI showing Multiple small foci of acute ischemia within the left anterior cerebral artery territory. No hemorrhage or mass effect.     PT Comments    Patient seen for mobility progression - improved BP this session. Patient performing functional mobility at [facility] guard to supervision level assist. Mild instability with gait, however able to self-correct without need for physical assist. Recommend OPPT at discharge. Will continue to follow.    Follow Up Recommendations  Outpatient PT;Supervision - Intermittent     Equipment Recommendations  None recommended by PT    Recommendations for Other Services       Precautions / Restrictions Precautions Precautions: Fall Restrictions Weight Bearing Restrictions: No    Mobility  Bed Mobility Overal bed mobility: Modified Independent                Transfers Overall transfer level: Needs assistance Equipment used: None Transfers: Sit to/from Stand;Stand Pivot Transfers Sit to Stand: Supervision Stand pivot transfers: Supervision       General transfer comment: for safety and immediate standing balance  Ambulation/Gait Ambulation/Gait assistance: Min guard Gait Distance (Feet): 250 Feet Assistive device: None Gait Pattern/deviations: Decreased step length - right;Decreased stride length;Drifts right/left Gait velocity: decreased   General Gait Details: mild instability with R <> L drifting - patient reports decreased balance at baseline due to R knee pain.    Stairs Stairs: Yes Stairs assistance: Min guard Stair Management: One rail Right;Step to pattern;Forwards Number of Stairs: 2     Wheelchair Mobility    Modified Rankin  (Stroke Patients Only) Modified Rankin (Stroke Patients Only) Pre-Morbid Rankin Score: No symptoms Modified Rankin: Moderate disability     Balance Overall balance assessment: Needs assistance Sitting-balance support: No upper extremity supported;Feet supported Sitting balance-Leahy Scale: Good     Standing balance support: No upper extremity supported;During functional activity Standing balance-Leahy Scale: Fair                              Cognition Arousal/Alertness: Awake/alert Behavior During Therapy: Flat affect Overall Cognitive Status: Within Functional Limits for tasks assessed                                        Exercises      General Comments        Pertinent Vitals/Pain Pain Assessment: No/denies pain    Home Living                      Prior Function            PT Goals (current goals can now be found in the care plan section) Acute Rehab PT Goals Patient Stated Goal: I need to go home PT Goal Formulation: With patient Time For Goal Achievement: 02/04/19 Potential to Achieve Goals: Good Progress towards PT goals: Progressing toward goals    Frequency    Min 4X/week      PT Plan Current plan remains appropriate    Co-evaluation              AM-PAC PT "6  Clicks" Mobility   Outcome Measure  Help needed turning from your back to your side while in a flat bed without using bedrails?: None Help needed moving from lying on your back to sitting on the side of a flat bed without using bedrails?: None Help needed moving to and from a bed to a chair (including a wheelchair)?: A Little Help needed standing up from a chair using your arms (e.g., wheelchair or bedside chair)?: A Little Help needed to walk in hospital room?: A Little Help needed climbing 3-5 steps with a railing? : A Little 6 Click Score: 20    End of Session Equipment Utilized During Treatment: Gait belt Activity Tolerance: Patient  tolerated treatment well Patient left: in bed;with call bell/phone within reach Nurse Communication: Mobility status PT Visit Diagnosis: Unsteadiness on feet (R26.81);Other abnormalities of gait and mobility (R26.89);Muscle weakness (generalized) (M62.81)     Time: 2878-6767 PT Time Calculation (min) (ACUTE ONLY): 28 min  Charges:  $Gait Training: 8-22 mins $Therapeutic Activity: 8-22 mins                      Lanney Gins, PT, DPT Supplemental Physical Therapist 01/22/19 9:32 AM Pager: [phone] Office: [phone]

## 2019-01-22 NOTE — Progress Notes (Signed)
Occupational Therapy Treatment Patient Details Name: Victoria Gutierrez MRN: [id number] DOB: 09-27-1945 Today's Date: 01/22/2019    History of present illness Patient is a 73 y/o female presenting to the ED on 7/25/020 with primary complaints of R sided weakness. PMH significant for HTN, CVA. MRI showing Multiple small foci of acute ischemia within the left anterior cerebral artery territory. No hemorrhage or mass effect.    OT comments  Pt making progress towards occupational therapy goals. Pt given Pillbox Test and was able to complete in 4 minutes. Pt with one medication errors when setting up pill box for the week. Pt finished with extra time as was asked if she wanted to check over box but declined. OT reviewed with pt and recommended she have daughter assist with medications or check over medications once home for safety. Pt agreeable to this suggestion. Pt continues to benefit from OT intervention at this time.    Follow Up Recommendations  No OT follow up;Supervision - Intermittent    Equipment Recommendations  None recommended by OT       Precautions / Restrictions Precautions Precautions: Fall Restrictions Weight Bearing Restrictions: No       Mobility Bed Mobility Overal bed mobility: Modified Independent Bed Mobility: Sit to Supine;Supine to Sit     Supine to sit: Supervision Sit to supine: Supervision      Transfers Overall transfer level: Needs assistance Equipment used: None Transfers: Sit to/from [employer] Transfers Sit to Stand: Supervision Stand pivot transfers: Supervision       General transfer comment: for safety and immediate standing balance    Balance Overall balance assessment: Needs assistance Sitting-balance support: No upper extremity supported;Feet supported Sitting balance-Leahy Scale: Good     Standing balance support: No upper extremity supported;During functional activity Standing balance-Leahy Scale: Fair        ADL  either performed or assessed with clinical judgement        Vision Baseline Vision/History: No visual deficits Patient Visual Report: No change from baseline Vision Assessment?: No apparent visual deficits          Cognition Arousal/Alertness: Awake/alert Behavior During Therapy: Flat affect Overall Cognitive Status: Within Functional Limits for tasks assessed        General Comments: Pt WFL for basic tasks.  Short Blessed Test administered with pt scoring 02/28 - she was unable to recall one piece of info provided.  patient HOH - difficult to assess full processing; when asked about her symptoms she denied weakness but then stated she couldn't move her R side                   Pertinent Vitals/ Pain       Pain Assessment: No/denies pain      Frequency  Min 2X/week        Progress Toward Goals  OT Goals(current goals can now be found in the care plan section)  Progress towards OT goals: Progressing toward goals  Acute Rehab OT Goals Patient Stated Goal: I need to go home OT Goal Formulation: With patient Time For Goal Achievement: 02/05/19 Potential to Achieve Goals: Good  Plan Discharge plan remains appropriate       AM-PAC OT "6 Clicks" Daily Activity     Outcome Measure   Help from another person eating meals?: None Help from another person taking care of personal grooming?: A Little Help from another person toileting, which includes using toliet, bedpan, or urinal?: A Little Help from another person bathing (  including washing, rinsing, drying)?: A Little Help from another person to put on and taking off regular upper body clothing?: A Little Help from another person to put on and taking off regular lower body clothing?: A Little 6 Click Score: 19    End of Session    OT Visit Diagnosis: Unsteadiness on feet (R26.81);Cognitive communication deficit (R41.841) Symptoms and signs involving cognitive functions: Cerebral infarction   Activity  Tolerance Patient tolerated treatment well   Patient Left in bed;with call bell/phone within reach;with bed alarm set   Nurse Communication Mobility status        Time: 8937-3428 OT Time Calculation (min): 14 min  Charges: OT General Charges $OT Visit: 1 Visit OT Treatments $Therapeutic Activity: 8-22 mins   Gypsy Decant 01/22/2019, 12:23 PM

## 2019-01-22 NOTE — Discharge Summary (Signed)
Physician Discharge Summary  Victoria Gutierrez RSW:[id number] DOB: 1946-04-04 DOA: 01/20/2019  PCP: [place] Pink, MD  Admit date: 01/20/2019 Discharge date: 01/22/2019  Admitted From: Home Disposition: Home  Recommendations for Outpatient Follow-up:  1. Follow up with PCP in 1 week 2. Follow up with Neurology and Vascular surgery 3. Please obtain BMP/CBC in one week since increasing lisinopril 4. Please follow up on the following pending results: None  Home Health: None Equipment/Devices: None  Discharge Condition: Stable CODE STATUS: Full code Diet recommendation: Heart healthy   Brief/Interim Summary:  Admission HPI written by Etta Quill, DO   Chief Complaint: R sided weakness  HPI: Victoria Gutierrez is a 73 y.o. female with medical history significant of HTN, stroke, CVD.  Patient presents to ED with c/o RUE and RLE weakness.  Onset 1845, sudden.  Presents to ED.   ED Course: Symptoms gradually improving while in ED.  Now good strength RUE.  Still some RLE weakness however.  No associated speech deficits, no headache, no CP.  Very hypertensive in ED with SBP initially 210s, now 185.  CTA head and neck show chronic M1 occlusion with collaterals.    Hospital course:  Stroke Multiple left ACA territory infarcts. Possibly embolic vs large vessel per neurology assessment. Patient's symptoms resolved. CT head and neck significant for chronic proximal left M1 occlusion and high grade stenosis of dominant right vertebral artery. Transthoracic Echocardiogram significant for no embolic source. LDL of 105, hemoglobin A1C of 5.7%. Started on aspirin and Plavix x3 months. Started on Lipitor. Recommendation for outpatient 30 day cardiac event monitor; request placed to cardiology.   Essential hypertension Significantly uncontrolled. Permissive hypertension with recommendations for BP <220/120 initially but prior to discharge, home regimen of hydrochlorothiazide  started and lisinopril increased to 40 mg daily. May need to be titrated further. Recommended BP goal of 130-150/80-90.  Hyperlipidemia LDL elevated at 105. Atorvastatin 40 mg daily  Psoriasis Continue methotrexate  Tobacco abuse Smoking cessation discussed during admission.   Discharge Diagnoses:  Principal Problem:   Ischemic stroke [facility]) Active Problems:   Cerebral vascular disease   Hyperlipidemia   Hypertension   Psoriasis, unspecified    Discharge Instructions  Discharge Instructions    Ambulatory referral to Neurology   Complete by: As directed    Follow up with stroke clinic NP (Jessica Vanschaick or Cecille Rubin, if both not available, consider Zachery Dauer, or Ahern) at [facility] in about 4 weeks. Thanks.     Allergies as of 01/22/2019   No Known Allergies     Medication List    TAKE these medications   aspirin 81 MG EC tablet Take 1 tablet (81 mg total) by mouth daily. Start taking on: January 23, 2019   atorvastatin 40 MG tablet Commonly known as: LIPITOR Take 1 tablet (40 mg total) by mouth daily at 6 PM.   citalopram 20 MG tablet Commonly known as: CELEXA Take 20 mg by mouth daily.   clopidogrel 75 MG tablet Commonly known as: PLAVIX Take 1 tablet (75 mg total) by mouth daily. Take for 90 days Start taking on: January 23, 2019 What changed: additional instructions   hydrochlorothiazide 12.5 MG capsule Commonly known as: MICROZIDE Take 12.5 mg by mouth daily.   lisinopril 40 MG tablet Commonly known as: ZESTRIL Take 1 tablet (40 mg total) by mouth daily. What changed:   medication strength  how much to take   Melatonin 5 MG Tbdp Take 5 mg by mouth at  bedtime.   methotrexate 2.5 MG tablet Commonly known as: RHEUMATREX Take 12.5 mg by mouth every Sunday.       No Known Allergies  Consultations:  Neurology   Procedures/Studies: Ct Angio Head W Or Wo Contrast  Result Date: 01/20/2019 CLINICAL DATA:  Code stroke.  Right-sided  weakness. EXAM: CT ANGIOGRAPHY HEAD AND NECK TECHNIQUE: Multidetector CT imaging of the head and neck was performed using the standard protocol during bolus administration of intravenous contrast. Multiplanar CT image reconstructions and MIPs were obtained to evaluate the vascular anatomy. Carotid stenosis measurements (when applicable) are obtained utilizing NASCET criteria, using the distal internal carotid diameter as the denominator. CONTRAST:  91mL OMNIPAQUE IOHEXOL 350 MG/ML SOLN COMPARISON:  CT head without contrast 01/20/2019. MRI brain 04/08/2017 FINDINGS: CTA NECK FINDINGS Aortic arch: Atherosclerotic calcifications are present at the origins the great vessels. There is no significant stenosis of greater than 50% relative to the more distal vessels. Calcifications are greatest at the subclavian artery. There is no aneurysm. Right carotid system: The right common carotid artery is within normal limits. Atherosclerotic calcifications are present along the medial aspect of the proximal right ICA. The minimal luminal diameter is 3 mm. The more distal right ICA is within normal limits. Left carotid system: The left common carotid artery is within normal limits. Dense calcifications are present without a significant stenosis relative to the more distal vessel. The more distal left ICA is normal. Vertebral arteries: Dense calcifications are present at the origin of the dominant right vertebral artery. There is a high-grade stenosis. There is no stenosis at the non dominant left vertebral artery origin. There is a moderate tandem stenosis at the entry into the spinal canal at C6 on the right. The more distal right vertebral artery is within normal limits. Calcifications are present at C1 without significant stenosis. The left vertebral artery is hypoplastic without focal stenosis in the neck. Skeleton: Multilevel degenerative changes are present in the cervical spine. Endplate changes are most pronounced at C5-6  and C6-7 with osseous foraminal narrowing bilaterally. No focal lytic or blastic lesions are present. Advanced facet hypertrophy is present at C2-3 and C3-4. Other neck: No focal mucosal or submucosal lesions are present. A dominant right thyroid nodule measures 2.1 x 1.3 x 1.6 cm. No significant adenopathy is present. The salivary glands are within normal limits. Upper chest: Mild dependent atelectasis is present. Lung apices are otherwise clear without focal nodule, mass, or airspace disease. The upper mediastinum is clear. Review of the MIP images confirms the above findings CTA HEAD FINDINGS Anterior circulation: Dense atherosclerotic calcifications are present throughout the cavernous internal carotid arteries bilaterally without significant stenosis relative to the more distal vessel. ICA termini are within normal limits. Right M1 segment is normal. Left A1 segment is dominant. The anterior communicating artery is patent. There is chronic proximal left MCA occlusion with collateral flow to left MCA branches. Right MCA bifurcation is intact. There is some attenuation of distal right MCA and bilateral distal ACA branches without a significant proximal stenosis or occlusion. Posterior circulation: The left vertebral artery terminates at the PICA. Dense atherosclerotic calcifications are present at the vertebral artery origin on the right. There is at least 50% stenosis relative to the more distal vessel. The basilar artery is small with moderate distal stenosis. The basilar artery terminates at the superior cerebellar arteries with small P1 segments extending into fetal type posterior cerebral arteries bilaterally. There is mild narrowing of the proximal P2 segments bilaterally without a  significant stenosis or occlusion. There is some attenuation of distal PCA branch vessels bilaterally. Venous sinuses: The dural sinuses are patent. The right transverse sinus is dominant. The straight sinus and deep cerebral  veins are intact. Cortical veins are within normal limits. Anatomic variants: Fetal type posterior cerebral arteries bilaterally. Review of the MIP images confirms the above findings IMPRESSION: 1. 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 2. 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 3. Atherosclerotic changes of the proximal internal carotid arteries bilaterally without significant stenosis relative to the more distal vessels. 4. 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 5. Tandem stenosis of the right vertebral artery at the C6 level. 6. Dominant right thyroid nodule. This was noted on previous nuclear medicine study. Consider further evaluation with thyroid ultrasound. If patient is clinically hyperthyroid, consider nuclear medicine thyroid uptake and scan. 7. Atherosclerotic changes within the cavernous internal carotid arteries bilaterally without a significant stenosis. 8. Collateral flow to left MCA branches. 9. Hypoplastic basilar artery with distal narrowing. Fetal type posterior cerebral arteries are present. Electronically Signed   By: San Morelle M.D.   On: 01/20/2019 20:55   Dg Chest 2 View  Result Date: 01/20/2019 CLINICAL DATA:  73 year old female with right-sided weakness. EXAM: CHEST - 2 VIEW COMPARISON:  Chest radiograph dated 12/30/2014 FINDINGS: The lungs are clear. There is no pleural effusion or pneumothorax. The cardiac silhouette is within normal limits. No acute osseous pathology. Degenerative changes of the spine. IMPRESSION: No active cardiopulmonary disease. Electronically Signed   By: Anner Crete M.D.   On: 01/20/2019 22:43   Ct Angio Neck W Or Wo Contrast  Result Date: 01/20/2019 CLINICAL DATA:  Code stroke.  Right-sided weakness. EXAM: CT ANGIOGRAPHY HEAD AND NECK TECHNIQUE: Multidetector CT imaging of the head and neck was performed using the standard protocol during bolus administration of  intravenous contrast. Multiplanar CT image reconstructions and MIPs were obtained to evaluate the vascular anatomy. Carotid stenosis measurements (when applicable) are obtained utilizing NASCET criteria, using the distal internal carotid diameter as the denominator. CONTRAST:  68mL OMNIPAQUE IOHEXOL 350 MG/ML SOLN COMPARISON:  CT head without contrast 01/20/2019. MRI brain 04/08/2017 FINDINGS: CTA NECK FINDINGS Aortic arch: Atherosclerotic calcifications are present at the origins the great vessels. There is no significant stenosis of greater than 50% relative to the more distal vessels. Calcifications are greatest at the subclavian artery. There is no aneurysm. Right carotid system: The right common carotid artery is within normal limits. Atherosclerotic calcifications are present along the medial aspect of the proximal right ICA. The minimal luminal diameter is 3 mm. The more distal right ICA is within normal limits. Left carotid system: The left common carotid artery is within normal limits. Dense calcifications are present without a significant stenosis relative to the more distal vessel. The more distal left ICA is normal. Vertebral arteries: Dense calcifications are present at the origin of the dominant right vertebral artery. There is a high-grade stenosis. There is no stenosis at the non dominant left vertebral artery origin. There is a moderate tandem stenosis at the entry into the spinal canal at C6 on the right. The more distal right vertebral artery is within normal limits. Calcifications are present at C1 without significant stenosis. The left vertebral artery is hypoplastic without focal stenosis in the neck. Skeleton: Multilevel degenerative changes are present in the cervical spine. Endplate changes are most pronounced at C5-6 and C6-7 with osseous foraminal narrowing bilaterally. No  focal lytic or blastic lesions are present. Advanced facet hypertrophy is present at C2-3 and C3-4. Other neck: No  focal mucosal or submucosal lesions are present. A dominant right thyroid nodule measures 2.1 x 1.3 x 1.6 cm. No significant adenopathy is present. The salivary glands are within normal limits. Upper chest: Mild dependent atelectasis is present. Lung apices are otherwise clear without focal nodule, mass, or airspace disease. The upper mediastinum is clear. Review of the MIP images confirms the above findings CTA HEAD FINDINGS Anterior circulation: Dense atherosclerotic calcifications are present throughout the cavernous internal carotid arteries bilaterally without significant stenosis relative to the more distal vessel. ICA termini are within normal limits. Right M1 segment is normal. Left A1 segment is dominant. The anterior communicating artery is patent. There is chronic proximal left MCA occlusion with collateral flow to left MCA branches. Right MCA bifurcation is intact. There is some attenuation of distal right MCA and bilateral distal ACA branches without a significant proximal stenosis or occlusion. Posterior circulation: The left vertebral artery terminates at the PICA. Dense atherosclerotic calcifications are present at the vertebral artery origin on the right. There is at least 50% stenosis relative to the more distal vessel. The basilar artery is small with moderate distal stenosis. The basilar artery terminates at the superior cerebellar arteries with small P1 segments extending into fetal type posterior cerebral arteries bilaterally. There is mild narrowing of the proximal P2 segments bilaterally without a significant stenosis or occlusion. There is some attenuation of distal PCA branch vessels bilaterally. Venous sinuses: The dural sinuses are patent. The right transverse sinus is dominant. The straight sinus and deep cerebral veins are intact. Cortical veins are within normal limits. Anatomic variants: Fetal type posterior cerebral arteries bilaterally. Review of the MIP images confirms the above  findings IMPRESSION: 1. 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 2. 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 3. Atherosclerotic changes of the proximal internal carotid arteries bilaterally without significant stenosis relative to the more distal vessels. 4. 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 5. Tandem stenosis of the right vertebral artery at the C6 level. 6. Dominant right thyroid nodule. This was noted on previous nuclear medicine study. Consider further evaluation with thyroid ultrasound. If patient is clinically hyperthyroid, consider nuclear medicine thyroid uptake and scan. 7. Atherosclerotic changes within the cavernous internal carotid arteries bilaterally without a significant stenosis. 8. Collateral flow to left MCA branches. 9. Hypoplastic basilar artery with distal narrowing. Fetal type posterior cerebral arteries are present. Electronically Signed   By: San Morelle M.D.   On: 01/20/2019 20:55   Mr Brain Wo Contrast  Result Date: 01/20/2019 CLINICAL DATA:  Right-sided weakness. EXAM: MRI HEAD WITHOUT CONTRAST TECHNIQUE: Multiplanar, multiecho pulse sequences of the brain and surrounding structures were obtained without intravenous contrast. COMPARISON:  Brain MRI 04/08/2017 FINDINGS: BRAIN: There are punctate areas of abnormal diffusion restriction within paramedian superior left hemisphere, within the frontal lobe. These are in the distribution of the anterior cerebral artery. Early confluent hyperintense T2-weighted signal of the periventricular and deep white matter, most commonly due to chronic ischemic microangiopathy. The cerebral and cerebellar volume are age-appropriate. There is no hydrocephalus. The midline structures are normal. There are multiple old small vessel infarcts of the periventricular white matter. VASCULAR: Susceptibility-sensitive sequences show no chronic microhemorrhage or superficial  siderosis. The major intracranial arterial and venous sinus flow voids are normal. SKULL AND UPPER CERVICAL SPINE: Calvarial bone marrow signal  is normal. There is no skull base mass. The visualized upper cervical spine and soft tissues are normal. SINUSES/ORBITS: Maxillary retention cyst. No mastoid or middle ear effusion. The orbits are normal. IMPRESSION: 1. Multiple small foci of acute ischemia within the left anterior cerebral artery territory. No hemorrhage or mass effect. 2. Severe chronic ischemic microangiopathy. Electronically Signed   By: Ulyses Jarred M.D.   On: 01/20/2019 23:52   Ct Head Code Stroke Wo Contrast  Result Date: 01/20/2019 CLINICAL DATA:  Code stroke.  Right-sided weakness EXAM: CT HEAD WITHOUT CONTRAST TECHNIQUE: Contiguous axial images were obtained from the base of the skull through the vertex without intravenous contrast. COMPARISON:  MRI brain 04/08/2017. CT head without contrast 08/12/2011 FINDINGS: Brain: Remote lacunar infarcts are again noted at the left caudate head and extending into the corona radiata. Extensive periventricular white matter hypoattenuation is noted bilaterally. No acute infarct, hemorrhage, or mass lesion is present. Basal ganglia are unchanged. Insular ribbon is intact. No focal cortical lesion is present. The brainstem and cerebellum are within normal limits. The ventricles are of normal size. There is some ex vacuo dilation of the left lateral ventricle. No significant extraaxial fluid collection is present. Vascular: Atherosclerotic calcifications are present within the cavernous internal carotid arteries bilaterally and at the dural margin of the right vertebral artery. There is no hyperdense vessel. Skull: Calvarium is intact. No focal lytic or blastic lesions are present. Sinuses/Orbits: A large polyp or mucous retention cyst is again noted within the left maxillary sinus. Mild mucosal thickening is present in the maxillary sinuses bilaterally. The  paranasal sinuses and mastoid air cells are otherwise clear. The globes and orbits are within normal limits. ASPECTS [facility] Stroke Program Early CT Score) - Ganglionic level infarction (caudate, lentiform nuclei, internal capsule, insula, M1-M3 cortex): 7/7 - Supraganglionic infarction (M4-M6 cortex): 3/3 Total score (0-10 with 10 being normal): 10/10 IMPRESSION: 1. No acute intracranial abnormality or significant interval change. 2. Remote lacunar infarcts involving the left caudate head and centrum semi ovale. 3. Advanced atrophy and diffuse white matter disease is otherwise stable. 4. ASPECTS is 10/10 The above was relayed via text pager to Dr. Leonel Ramsay IS on 01/20/2019 at 20:24 . Electronically Signed   By: San Morelle M.D.   On: 01/20/2019 20:25      Transthoracic Echocardiogram (01/21/2019) IMPRESSIONS   1. The left ventricle has a visually estimated ejection fraction of 55%. The cavity size was normal. There is mildly increased left ventricular wall thickness. Left ventricular diastolic Doppler parameters are consistent with impaired relaxation. 2. The right ventricle has normal systolic function. The cavity was normal. There is no increase in right ventricular wall thickness. Right ventricular systolic pressure could not be assessed. 3. The aortic valve is tricuspid. Mild aortic annular calcification noted. 4. The mitral valve is grossly normal. Mild calcification of the mitral valve leaflet. 5. The tricuspid valve is grossly normal. 6. The aorta is normal in size and structure.   Subjective: No issues overnight. No issues this morning.  Discharge Exam: Vitals:   01/22/19 0840 01/22/19 1156  BP: (!) 186/106 (!) 190/100  Pulse: 87 75  Resp: 16 (!) 9  Temp: 97.8 F (36.6 C) 98.4 F (36.9 C)  SpO2: 98% 98%   Vitals:   01/21/19 2359 01/22/19 0340 01/22/19 0840 01/22/19 1156  BP: (!) 193/89 (!) 175/85 (!) 186/106 (!) 190/100  Pulse: 70 69 87 75  Resp: 17 16  16  (!) 9  Temp: 99.2 F (37.3 C)  99.1 F (37.3 C) 97.8 F (36.6 C) 98.4 F (36.9 C)  TempSrc: Oral Oral Oral Oral  SpO2: 97% 97% 98% 98%  Weight:      Height:        General: Pt is alert, awake, not in acute distress Cardiovascular: RRR, S1/S2 +, no rubs, no gallops Respiratory: CTA bilaterally, no wheezing, no rhonchi Abdominal: Soft, NT, ND, bowel sounds + Extremities: no edema, no cyanosis    The results of significant diagnostics from this hospitalization (including imaging, microbiology, ancillary and laboratory) are listed below for reference.     Microbiology: Recent Results (from the past 240 hour(s))  SARS Coronavirus 2 (CEPHEID - Performed in [place] hospital lab), Hosp Order     Status: None   Collection Time: 01/20/19 10:07 PM   Specimen: Nasopharyngeal Swab  Result Value Ref Range Status   SARS Coronavirus 2 NEGATIVE NEGATIVE Final    Comment: (NOTE) If result is NEGATIVE SARS-CoV-2 target nucleic acids are NOT DETECTED. The SARS-CoV-2 RNA is generally detectable in upper and lower  respiratory specimens during the acute phase of infection. The lowest  concentration of SARS-CoV-2 viral copies this assay can detect is 250  copies / mL. A negative result does not preclude SARS-CoV-2 infection  and should not be used as the sole basis for treatment or other  patient management decisions.  A negative result may occur with  improper specimen collection / handling, submission of specimen other  than nasopharyngeal swab, presence of viral mutation(s) within the  areas targeted by this assay, and inadequate number of viral copies  (<250 copies / mL). A negative result must be combined with clinical  observations, patient history, and epidemiological information. If result is POSITIVE SARS-CoV-2 target nucleic acids are DETECTED. The SARS-CoV-2 RNA is generally detectable in upper and lower  respiratory specimens dur ing the acute phase of infection.  Positive    results are indicative of active infection with SARS-CoV-2.  Clinical  correlation with patient history and other diagnostic information is  necessary to determine patient infection status.  Positive results do  not rule out bacterial infection or co-infection with other viruses. If result is PRESUMPTIVE POSTIVE SARS-CoV-2 nucleic acids MAY BE PRESENT.   A presumptive positive result was obtained on the submitted specimen  and confirmed on repeat testing.  While 2019 novel coronavirus  (SARS-CoV-2) nucleic acids may be present in the submitted sample  additional confirmatory testing may be necessary for epidemiological  and / or clinical management purposes  to differentiate between  SARS-CoV-2 and other Sarbecovirus currently known to infect humans.  If clinically indicated additional testing with an alternate test  methodology [phone]) is advised. The SARS-CoV-2 RNA is generally  detectable in upper and lower respiratory sp ecimens during the acute  phase of infection. The expected result is Negative. Fact Sheet for Patients:  StrictlyIdeas.no Fact Sheet for Healthcare Providers: BankingDealers.co.za This test is not yet approved or cleared by the [place] FDA and has been authorized for detection and/or diagnosis of SARS-CoV-2 by FDA under an Emergency Use Authorization (EUA).  This EUA will remain in effect (meaning this test can be used) for the duration of the COVID-19 declaration under Section 564(b)(1) of the Act, 21 U.S.C. section 360bbb-3(b)(1), unless the authorization is terminated or revoked sooner. Performed at [facility], [place] [address], [place], [place] [address]      Labs: BNP (last 3 results) No results for input(s): BNP in the last 8760 hours.  Basic Metabolic Panel: Recent Labs  Lab 01/20/19 2015 01/20/19 2017 01/22/19 0554  NA 142 141 139  K 3.6 3.3* 4.3  CL 110 109 107  CO2 18*  --  23   GLUCOSE 143* 143* 117*  BUN 14 15 14   CREATININE 1.25* 1.20* 1.20*  CALCIUM 8.9  --  9.4   Liver Function Tests: Recent Labs  Lab 01/20/19 2015  AST 19  ALT 17  ALKPHOS 74  BILITOT 0.6  PROT 5.4*  ALBUMIN 3.4*   No results for input(s): LIPASE, AMYLASE in the last 168 hours. No results for input(s): AMMONIA in the last 168 hours. CBC: Recent Labs  Lab 01/20/19 2015 01/20/19 2017  WBC 11.2*  --   NEUTROABS 7.2  --   HGB 15.0 14.3  HCT 44.6 42.0  MCV 102.3*  --   PLT 223  --    Cardiac Enzymes: No results for input(s): CKTOTAL, CKMB, CKMBINDEX, TROPONINI in the last 168 hours. BNP: Invalid input(s): POCBNP CBG: No results for input(s): GLUCAP in the last 168 hours. D-Dimer No results for input(s): DDIMER in the last 72 hours. Hgb A1c Recent Labs    01/21/19 0408  HGBA1C 5.7*   Lipid Profile Recent Labs    01/21/19 0408  CHOL 162  HDL 34*  LDLCALC 105*  TRIG 115  CHOLHDL 4.8   Thyroid function studies No results for input(s): TSH, T4TOTAL, T3FREE, THYROIDAB in the last 72 hours.  Invalid input(s): FREET3 Anemia work up No results for input(s): VITAMINB12, FOLATE, FERRITIN, TIBC, IRON, RETICCTPCT in the last 72 hours. Urinalysis    Component Value Date/Time   COLORURINE YELLOW 01/20/2019 2111   APPEARANCEUR HAZY (A) 01/20/2019 2111   APPEARANCEUR Hazy 08/11/2011 2253   LABSPEC 1.032 (H) 01/20/2019 2111   LABSPEC 1.011 08/11/2011 2253   PHURINE 8.0 01/20/2019 2111   GLUCOSEU NEGATIVE 01/20/2019 2111   GLUCOSEU Negative 08/11/2011 2253   HGBUR SMALL (A) 01/20/2019 2111   BILIRUBINUR NEGATIVE 01/20/2019 2111   BILIRUBINUR Negative 08/11/2011 [place] 01/20/2019 2111   PROTEINUR NEGATIVE 01/20/2019 2111   NITRITE NEGATIVE 01/20/2019 2111   LEUKOCYTESUR NEGATIVE 01/20/2019 2111   LEUKOCYTESUR Negative 08/11/2011 2253   Sepsis Labs Invalid input(s): PROCALCITONIN,  WBC,  LACTICIDVEN Microbiology Recent Results (from the past  240 hour(s))  SARS Coronavirus 2 (CEPHEID - Performed in [facility] hospital lab), Hosp Order     Status: None   Collection Time: 01/20/19 10:07 PM   Specimen: Nasopharyngeal Swab  Result Value Ref Range Status   SARS Coronavirus 2 NEGATIVE NEGATIVE Final    Comment: (NOTE) If result is NEGATIVE SARS-CoV-2 target nucleic acids are NOT DETECTED. The SARS-CoV-2 RNA is generally detectable in upper and lower  respiratory specimens during the acute phase of infection. The lowest  concentration of SARS-CoV-2 viral copies this assay can detect is 250  copies / mL. A negative result does not preclude SARS-CoV-2 infection  and should not be used as the sole basis for treatment or other  patient management decisions.  A negative result may occur with  improper specimen collection / handling, submission of specimen other  than nasopharyngeal swab, presence of viral mutation(s) within the  areas targeted by this assay, and inadequate number of viral copies  (<250 copies / mL). A negative result must be combined with clinical  observations, patient history, and epidemiological information. If result is POSITIVE SARS-CoV-2 target nucleic acids are DETECTED. The SARS-CoV-2 RNA is generally detectable in upper  and lower  respiratory specimens dur ing the acute phase of infection.  Positive  results are indicative of active infection with SARS-CoV-2.  Clinical  correlation with patient history and other diagnostic information is  necessary to determine patient infection status.  Positive results do  not rule out bacterial infection or co-infection with other viruses. If result is PRESUMPTIVE POSTIVE SARS-CoV-2 nucleic acids MAY BE PRESENT.   A presumptive positive result was obtained on the submitted specimen  and confirmed on repeat testing.  While 2019 novel coronavirus  (SARS-CoV-2) nucleic acids may be present in the submitted sample  additional confirmatory testing may be necessary for  epidemiological  and / or clinical management purposes  to differentiate between  SARS-CoV-2 and other Sarbecovirus currently known to infect humans.  If clinically indicated additional testing with an alternate test  methodology [phone]) is advised. The SARS-CoV-2 RNA is generally  detectable in upper and lower respiratory sp ecimens during the acute  phase of infection. The expected result is Negative. Fact Sheet for Patients:  StrictlyIdeas.no Fact Sheet for Healthcare Providers: BankingDealers.co.za This test is not yet approved or cleared by the [place] FDA and has been authorized for detection and/or diagnosis of SARS-CoV-2 by FDA under an Emergency Use Authorization (EUA).  This EUA will remain in effect (meaning this test can be used) for the duration of the COVID-19 declaration under Section 564(b)(1) of the Act, 21 U.S.C. section 360bbb-3(b)(1), unless the authorization is terminated or revoked sooner. Performed at [facility], [place] [address], [place], [place] [address]      Time coordinating discharge: 35 minutes  SIGNED:   Cordelia Poche, MD [facility] 01/22/2019, 12:34 PM

## 2019-01-22 NOTE — Discharge Instructions (Signed)
Victoria Gutierrez,  You were in the hospital and found to have a stroke. Your medications have been adjusted. I am also increasing your blood pressure medications to get your blood pressure better controlled. The neurologist would like your systolic blood pressure (top number) to be between [phone] and your diastolic (bottom number) to be between 80-90. Please return if you have recurrence of symptoms.

## 2019-01-22 NOTE — TOC Transition Note (Signed)
Transition of Care [facility]) - CM/SW Discharge Note   Patient Details  Name: Victoria Gutierrez MRN: [id number] Date of Birth: May 23, 1946  [facility] CM/SW Contact:  Geralynn Ochs, LCSW Phone Number: 01/22/2019, 3:41 PM   Clinical Narrative:  CSW met with patient to set up outpatient rehab. Patient requested outpatient closer to her home. CSW discussed with patient how she was positive for marijuana on admission, asked if she would like resources to quit. Patient declined. Patient set up with outpatient therapy at [facility].     Final next level of care: OP Rehab Barriers to Discharge: Barriers Resolved   Patient Goals and CMS Choice Patient states their goals for this hospitalization and ongoing recovery are:: to get back home CMS Medicare.gov Compare Post Acute Care list provided to:: Patient Choice offered to / list presented to : Patient  Discharge Placement                       Discharge Plan and Services                                     Social Determinants of Health (SDOH) Interventions     Readmission Risk Interventions No flowsheet data found.

## 2019-01-23 ENCOUNTER — Telehealth: Payer: Self-pay | Admitting: *Deleted

## 2019-01-23 NOTE — Telephone Encounter (Signed)
Patient's daughter returned your call.  

## 2019-01-23 NOTE — Telephone Encounter (Signed)
Please call [phone] to set up cardiac event monitor

## 2019-01-23 NOTE — Telephone Encounter (Signed)
[employer] to ship a 30 day cardiac event monitor to the patients home.  Instructions reviewed briefly as they are included in the monitor kit. 

## 2019-01-26 DIAGNOSIS — I69398 Other sequelae of cerebral infarction: Secondary | ICD-10-CM | POA: Diagnosis not present

## 2019-01-26 DIAGNOSIS — M810 Age-related osteoporosis without current pathological fracture: Secondary | ICD-10-CM | POA: Diagnosis not present

## 2019-01-26 DIAGNOSIS — I10 Essential (primary) hypertension: Secondary | ICD-10-CM | POA: Diagnosis not present

## 2019-01-26 DIAGNOSIS — M19042 Primary osteoarthritis, left hand: Secondary | ICD-10-CM | POA: Diagnosis not present

## 2019-01-26 DIAGNOSIS — M19041 Primary osteoarthritis, right hand: Secondary | ICD-10-CM | POA: Diagnosis not present

## 2019-01-26 DIAGNOSIS — L409 Psoriasis, unspecified: Secondary | ICD-10-CM | POA: Diagnosis not present

## 2019-01-26 DIAGNOSIS — M6281 Muscle weakness (generalized): Secondary | ICD-10-CM | POA: Diagnosis not present

## 2019-01-26 DIAGNOSIS — F17210 Nicotine dependence, cigarettes, uncomplicated: Secondary | ICD-10-CM | POA: Diagnosis not present

## 2019-01-26 DIAGNOSIS — M545 Low back pain: Secondary | ICD-10-CM | POA: Diagnosis not present

## 2019-01-28 ENCOUNTER — Ambulatory Visit (INDEPENDENT_AMBULATORY_CARE_PROVIDER_SITE_OTHER): Payer: Medicare HMO

## 2019-01-28 DIAGNOSIS — I639 Cerebral infarction, unspecified: Secondary | ICD-10-CM

## 2019-01-28 DIAGNOSIS — I4891 Unspecified atrial fibrillation: Secondary | ICD-10-CM | POA: Diagnosis not present

## 2019-01-29 DIAGNOSIS — M19041 Primary osteoarthritis, right hand: Secondary | ICD-10-CM | POA: Diagnosis not present

## 2019-01-29 DIAGNOSIS — F17210 Nicotine dependence, cigarettes, uncomplicated: Secondary | ICD-10-CM | POA: Diagnosis not present

## 2019-01-29 DIAGNOSIS — M6281 Muscle weakness (generalized): Secondary | ICD-10-CM | POA: Diagnosis not present

## 2019-01-29 DIAGNOSIS — M19042 Primary osteoarthritis, left hand: Secondary | ICD-10-CM | POA: Diagnosis not present

## 2019-01-29 DIAGNOSIS — I639 Cerebral infarction, unspecified: Secondary | ICD-10-CM | POA: Diagnosis not present

## 2019-01-29 DIAGNOSIS — M545 Low back pain: Secondary | ICD-10-CM | POA: Diagnosis not present

## 2019-01-29 DIAGNOSIS — M810 Age-related osteoporosis without current pathological fracture: Secondary | ICD-10-CM | POA: Diagnosis not present

## 2019-01-29 DIAGNOSIS — I69398 Other sequelae of cerebral infarction: Secondary | ICD-10-CM | POA: Diagnosis not present

## 2019-01-29 DIAGNOSIS — I10 Essential (primary) hypertension: Secondary | ICD-10-CM | POA: Diagnosis not present

## 2019-01-29 DIAGNOSIS — R300 Dysuria: Secondary | ICD-10-CM | POA: Diagnosis not present

## 2019-01-29 DIAGNOSIS — L409 Psoriasis, unspecified: Secondary | ICD-10-CM | POA: Diagnosis not present

## 2019-01-30 DIAGNOSIS — M6281 Muscle weakness (generalized): Secondary | ICD-10-CM | POA: Diagnosis not present

## 2019-01-30 DIAGNOSIS — M19042 Primary osteoarthritis, left hand: Secondary | ICD-10-CM | POA: Diagnosis not present

## 2019-01-30 DIAGNOSIS — M810 Age-related osteoporosis without current pathological fracture: Secondary | ICD-10-CM | POA: Diagnosis not present

## 2019-01-30 DIAGNOSIS — I69398 Other sequelae of cerebral infarction: Secondary | ICD-10-CM | POA: Diagnosis not present

## 2019-01-30 DIAGNOSIS — I10 Essential (primary) hypertension: Secondary | ICD-10-CM | POA: Diagnosis not present

## 2019-01-30 DIAGNOSIS — M545 Low back pain: Secondary | ICD-10-CM | POA: Diagnosis not present

## 2019-01-30 DIAGNOSIS — M19041 Primary osteoarthritis, right hand: Secondary | ICD-10-CM | POA: Diagnosis not present

## 2019-01-30 DIAGNOSIS — F17210 Nicotine dependence, cigarettes, uncomplicated: Secondary | ICD-10-CM | POA: Diagnosis not present

## 2019-01-30 DIAGNOSIS — L409 Psoriasis, unspecified: Secondary | ICD-10-CM | POA: Diagnosis not present

## 2019-01-31 DIAGNOSIS — F17210 Nicotine dependence, cigarettes, uncomplicated: Secondary | ICD-10-CM | POA: Diagnosis not present

## 2019-01-31 DIAGNOSIS — M545 Low back pain: Secondary | ICD-10-CM | POA: Diagnosis not present

## 2019-01-31 DIAGNOSIS — I10 Essential (primary) hypertension: Secondary | ICD-10-CM | POA: Diagnosis not present

## 2019-01-31 DIAGNOSIS — R300 Dysuria: Secondary | ICD-10-CM | POA: Diagnosis not present

## 2019-01-31 DIAGNOSIS — I69398 Other sequelae of cerebral infarction: Secondary | ICD-10-CM | POA: Diagnosis not present

## 2019-01-31 DIAGNOSIS — M19041 Primary osteoarthritis, right hand: Secondary | ICD-10-CM | POA: Diagnosis not present

## 2019-01-31 DIAGNOSIS — I639 Cerebral infarction, unspecified: Secondary | ICD-10-CM | POA: Diagnosis not present

## 2019-01-31 DIAGNOSIS — M19042 Primary osteoarthritis, left hand: Secondary | ICD-10-CM | POA: Diagnosis not present

## 2019-01-31 DIAGNOSIS — L409 Psoriasis, unspecified: Secondary | ICD-10-CM | POA: Diagnosis not present

## 2019-01-31 DIAGNOSIS — M6281 Muscle weakness (generalized): Secondary | ICD-10-CM | POA: Diagnosis not present

## 2019-01-31 DIAGNOSIS — M810 Age-related osteoporosis without current pathological fracture: Secondary | ICD-10-CM | POA: Diagnosis not present

## 2019-02-01 DIAGNOSIS — I69398 Other sequelae of cerebral infarction: Secondary | ICD-10-CM | POA: Diagnosis not present

## 2019-02-01 DIAGNOSIS — L409 Psoriasis, unspecified: Secondary | ICD-10-CM | POA: Diagnosis not present

## 2019-02-01 DIAGNOSIS — M19041 Primary osteoarthritis, right hand: Secondary | ICD-10-CM | POA: Diagnosis not present

## 2019-02-01 DIAGNOSIS — M545 Low back pain: Secondary | ICD-10-CM | POA: Diagnosis not present

## 2019-02-01 DIAGNOSIS — I10 Essential (primary) hypertension: Secondary | ICD-10-CM | POA: Diagnosis not present

## 2019-02-01 DIAGNOSIS — M810 Age-related osteoporosis without current pathological fracture: Secondary | ICD-10-CM | POA: Diagnosis not present

## 2019-02-01 DIAGNOSIS — F17210 Nicotine dependence, cigarettes, uncomplicated: Secondary | ICD-10-CM | POA: Diagnosis not present

## 2019-02-01 DIAGNOSIS — M6281 Muscle weakness (generalized): Secondary | ICD-10-CM | POA: Diagnosis not present

## 2019-02-01 DIAGNOSIS — M19042 Primary osteoarthritis, left hand: Secondary | ICD-10-CM | POA: Diagnosis not present

## 2019-02-02 DIAGNOSIS — M19042 Primary osteoarthritis, left hand: Secondary | ICD-10-CM | POA: Diagnosis not present

## 2019-02-02 DIAGNOSIS — I69398 Other sequelae of cerebral infarction: Secondary | ICD-10-CM | POA: Diagnosis not present

## 2019-02-02 DIAGNOSIS — M545 Low back pain: Secondary | ICD-10-CM | POA: Diagnosis not present

## 2019-02-02 DIAGNOSIS — L409 Psoriasis, unspecified: Secondary | ICD-10-CM | POA: Diagnosis not present

## 2019-02-02 DIAGNOSIS — M6281 Muscle weakness (generalized): Secondary | ICD-10-CM | POA: Diagnosis not present

## 2019-02-02 DIAGNOSIS — F17210 Nicotine dependence, cigarettes, uncomplicated: Secondary | ICD-10-CM | POA: Diagnosis not present

## 2019-02-02 DIAGNOSIS — M19041 Primary osteoarthritis, right hand: Secondary | ICD-10-CM | POA: Diagnosis not present

## 2019-02-02 DIAGNOSIS — I10 Essential (primary) hypertension: Secondary | ICD-10-CM | POA: Diagnosis not present

## 2019-02-02 DIAGNOSIS — M810 Age-related osteoporosis without current pathological fracture: Secondary | ICD-10-CM | POA: Diagnosis not present

## 2019-02-03 DIAGNOSIS — F17210 Nicotine dependence, cigarettes, uncomplicated: Secondary | ICD-10-CM | POA: Diagnosis not present

## 2019-02-03 DIAGNOSIS — M19042 Primary osteoarthritis, left hand: Secondary | ICD-10-CM | POA: Diagnosis not present

## 2019-02-03 DIAGNOSIS — L409 Psoriasis, unspecified: Secondary | ICD-10-CM | POA: Diagnosis not present

## 2019-02-03 DIAGNOSIS — M6281 Muscle weakness (generalized): Secondary | ICD-10-CM | POA: Diagnosis not present

## 2019-02-03 DIAGNOSIS — I69398 Other sequelae of cerebral infarction: Secondary | ICD-10-CM | POA: Diagnosis not present

## 2019-02-03 DIAGNOSIS — M19041 Primary osteoarthritis, right hand: Secondary | ICD-10-CM | POA: Diagnosis not present

## 2019-02-03 DIAGNOSIS — M545 Low back pain: Secondary | ICD-10-CM | POA: Diagnosis not present

## 2019-02-03 DIAGNOSIS — I10 Essential (primary) hypertension: Secondary | ICD-10-CM | POA: Diagnosis not present

## 2019-02-03 DIAGNOSIS — M810 Age-related osteoporosis without current pathological fracture: Secondary | ICD-10-CM | POA: Diagnosis not present

## 2019-02-05 DIAGNOSIS — I69398 Other sequelae of cerebral infarction: Secondary | ICD-10-CM | POA: Diagnosis not present

## 2019-02-05 DIAGNOSIS — M6281 Muscle weakness (generalized): Secondary | ICD-10-CM | POA: Diagnosis not present

## 2019-02-05 DIAGNOSIS — M545 Low back pain: Secondary | ICD-10-CM | POA: Diagnosis not present

## 2019-02-05 DIAGNOSIS — M19041 Primary osteoarthritis, right hand: Secondary | ICD-10-CM | POA: Diagnosis not present

## 2019-02-05 DIAGNOSIS — M810 Age-related osteoporosis without current pathological fracture: Secondary | ICD-10-CM | POA: Diagnosis not present

## 2019-02-05 DIAGNOSIS — L409 Psoriasis, unspecified: Secondary | ICD-10-CM | POA: Diagnosis not present

## 2019-02-05 DIAGNOSIS — F17210 Nicotine dependence, cigarettes, uncomplicated: Secondary | ICD-10-CM | POA: Diagnosis not present

## 2019-02-05 DIAGNOSIS — I10 Essential (primary) hypertension: Secondary | ICD-10-CM | POA: Diagnosis not present

## 2019-02-05 DIAGNOSIS — M19042 Primary osteoarthritis, left hand: Secondary | ICD-10-CM | POA: Diagnosis not present

## 2019-02-06 DIAGNOSIS — L409 Psoriasis, unspecified: Secondary | ICD-10-CM | POA: Diagnosis not present

## 2019-02-06 DIAGNOSIS — M6281 Muscle weakness (generalized): Secondary | ICD-10-CM | POA: Diagnosis not present

## 2019-02-06 DIAGNOSIS — M19041 Primary osteoarthritis, right hand: Secondary | ICD-10-CM | POA: Diagnosis not present

## 2019-02-06 DIAGNOSIS — M545 Low back pain: Secondary | ICD-10-CM | POA: Diagnosis not present

## 2019-02-06 DIAGNOSIS — I10 Essential (primary) hypertension: Secondary | ICD-10-CM | POA: Diagnosis not present

## 2019-02-06 DIAGNOSIS — F17210 Nicotine dependence, cigarettes, uncomplicated: Secondary | ICD-10-CM | POA: Diagnosis not present

## 2019-02-06 DIAGNOSIS — I69398 Other sequelae of cerebral infarction: Secondary | ICD-10-CM | POA: Diagnosis not present

## 2019-02-06 DIAGNOSIS — M810 Age-related osteoporosis without current pathological fracture: Secondary | ICD-10-CM | POA: Diagnosis not present

## 2019-02-06 DIAGNOSIS — M19042 Primary osteoarthritis, left hand: Secondary | ICD-10-CM | POA: Diagnosis not present

## 2019-02-07 DIAGNOSIS — M810 Age-related osteoporosis without current pathological fracture: Secondary | ICD-10-CM | POA: Diagnosis not present

## 2019-02-07 DIAGNOSIS — M6281 Muscle weakness (generalized): Secondary | ICD-10-CM | POA: Diagnosis not present

## 2019-02-07 DIAGNOSIS — M19041 Primary osteoarthritis, right hand: Secondary | ICD-10-CM | POA: Diagnosis not present

## 2019-02-07 DIAGNOSIS — I10 Essential (primary) hypertension: Secondary | ICD-10-CM | POA: Diagnosis not present

## 2019-02-07 DIAGNOSIS — M19042 Primary osteoarthritis, left hand: Secondary | ICD-10-CM | POA: Diagnosis not present

## 2019-02-07 DIAGNOSIS — I69398 Other sequelae of cerebral infarction: Secondary | ICD-10-CM | POA: Diagnosis not present

## 2019-02-07 DIAGNOSIS — F17210 Nicotine dependence, cigarettes, uncomplicated: Secondary | ICD-10-CM | POA: Diagnosis not present

## 2019-02-07 DIAGNOSIS — M545 Low back pain: Secondary | ICD-10-CM | POA: Diagnosis not present

## 2019-02-07 DIAGNOSIS — L409 Psoriasis, unspecified: Secondary | ICD-10-CM | POA: Diagnosis not present

## 2019-02-08 DIAGNOSIS — M545 Low back pain: Secondary | ICD-10-CM | POA: Diagnosis not present

## 2019-02-08 DIAGNOSIS — I69398 Other sequelae of cerebral infarction: Secondary | ICD-10-CM | POA: Diagnosis not present

## 2019-02-08 DIAGNOSIS — M19042 Primary osteoarthritis, left hand: Secondary | ICD-10-CM | POA: Diagnosis not present

## 2019-02-08 DIAGNOSIS — M6281 Muscle weakness (generalized): Secondary | ICD-10-CM | POA: Diagnosis not present

## 2019-02-08 DIAGNOSIS — M19041 Primary osteoarthritis, right hand: Secondary | ICD-10-CM | POA: Diagnosis not present

## 2019-02-08 DIAGNOSIS — F17210 Nicotine dependence, cigarettes, uncomplicated: Secondary | ICD-10-CM | POA: Diagnosis not present

## 2019-02-08 DIAGNOSIS — M810 Age-related osteoporosis without current pathological fracture: Secondary | ICD-10-CM | POA: Diagnosis not present

## 2019-02-08 DIAGNOSIS — I10 Essential (primary) hypertension: Secondary | ICD-10-CM | POA: Diagnosis not present

## 2019-02-08 DIAGNOSIS — L409 Psoriasis, unspecified: Secondary | ICD-10-CM | POA: Diagnosis not present

## 2019-02-09 DIAGNOSIS — F17210 Nicotine dependence, cigarettes, uncomplicated: Secondary | ICD-10-CM | POA: Diagnosis not present

## 2019-02-09 DIAGNOSIS — L409 Psoriasis, unspecified: Secondary | ICD-10-CM | POA: Diagnosis not present

## 2019-02-09 DIAGNOSIS — M19041 Primary osteoarthritis, right hand: Secondary | ICD-10-CM | POA: Diagnosis not present

## 2019-02-09 DIAGNOSIS — M6281 Muscle weakness (generalized): Secondary | ICD-10-CM | POA: Diagnosis not present

## 2019-02-09 DIAGNOSIS — M19042 Primary osteoarthritis, left hand: Secondary | ICD-10-CM | POA: Diagnosis not present

## 2019-02-09 DIAGNOSIS — M810 Age-related osteoporosis without current pathological fracture: Secondary | ICD-10-CM | POA: Diagnosis not present

## 2019-02-09 DIAGNOSIS — M545 Low back pain: Secondary | ICD-10-CM | POA: Diagnosis not present

## 2019-02-09 DIAGNOSIS — I10 Essential (primary) hypertension: Secondary | ICD-10-CM | POA: Diagnosis not present

## 2019-02-09 DIAGNOSIS — I69398 Other sequelae of cerebral infarction: Secondary | ICD-10-CM | POA: Diagnosis not present

## 2019-02-13 DIAGNOSIS — M19041 Primary osteoarthritis, right hand: Secondary | ICD-10-CM | POA: Diagnosis not present

## 2019-02-13 DIAGNOSIS — M545 Low back pain: Secondary | ICD-10-CM | POA: Diagnosis not present

## 2019-02-13 DIAGNOSIS — R300 Dysuria: Secondary | ICD-10-CM | POA: Diagnosis not present

## 2019-02-13 DIAGNOSIS — I69398 Other sequelae of cerebral infarction: Secondary | ICD-10-CM | POA: Diagnosis not present

## 2019-02-13 DIAGNOSIS — I10 Essential (primary) hypertension: Secondary | ICD-10-CM | POA: Diagnosis not present

## 2019-02-13 DIAGNOSIS — F17210 Nicotine dependence, cigarettes, uncomplicated: Secondary | ICD-10-CM | POA: Diagnosis not present

## 2019-02-13 DIAGNOSIS — M19042 Primary osteoarthritis, left hand: Secondary | ICD-10-CM | POA: Diagnosis not present

## 2019-02-13 DIAGNOSIS — L409 Psoriasis, unspecified: Secondary | ICD-10-CM | POA: Diagnosis not present

## 2019-02-13 DIAGNOSIS — M810 Age-related osteoporosis without current pathological fracture: Secondary | ICD-10-CM | POA: Diagnosis not present

## 2019-02-13 DIAGNOSIS — M6281 Muscle weakness (generalized): Secondary | ICD-10-CM | POA: Diagnosis not present

## 2019-02-14 DIAGNOSIS — M545 Low back pain: Secondary | ICD-10-CM | POA: Diagnosis not present

## 2019-02-14 DIAGNOSIS — M6281 Muscle weakness (generalized): Secondary | ICD-10-CM | POA: Diagnosis not present

## 2019-02-14 DIAGNOSIS — L409 Psoriasis, unspecified: Secondary | ICD-10-CM | POA: Diagnosis not present

## 2019-02-14 DIAGNOSIS — M19041 Primary osteoarthritis, right hand: Secondary | ICD-10-CM | POA: Diagnosis not present

## 2019-02-14 DIAGNOSIS — I10 Essential (primary) hypertension: Secondary | ICD-10-CM | POA: Diagnosis not present

## 2019-02-14 DIAGNOSIS — M19042 Primary osteoarthritis, left hand: Secondary | ICD-10-CM | POA: Diagnosis not present

## 2019-02-14 DIAGNOSIS — F17210 Nicotine dependence, cigarettes, uncomplicated: Secondary | ICD-10-CM | POA: Diagnosis not present

## 2019-02-14 DIAGNOSIS — I69398 Other sequelae of cerebral infarction: Secondary | ICD-10-CM | POA: Diagnosis not present

## 2019-02-14 DIAGNOSIS — M810 Age-related osteoporosis without current pathological fracture: Secondary | ICD-10-CM | POA: Diagnosis not present

## 2019-02-20 ENCOUNTER — Ambulatory Visit (INDEPENDENT_AMBULATORY_CARE_PROVIDER_SITE_OTHER): Payer: Medicare HMO | Admitting: Vascular Surgery

## 2019-02-20 ENCOUNTER — Other Ambulatory Visit: Payer: Self-pay

## 2019-02-20 ENCOUNTER — Encounter: Payer: Self-pay | Admitting: Vascular Surgery

## 2019-02-20 DIAGNOSIS — I6523 Occlusion and stenosis of bilateral carotid arteries: Secondary | ICD-10-CM

## 2019-02-20 DIAGNOSIS — I6529 Occlusion and stenosis of unspecified carotid artery: Secondary | ICD-10-CM | POA: Insufficient documentation

## 2019-02-20 NOTE — Progress Notes (Signed)
Patient name: Victoria Gutierrez MRN: [id number] DOB: 15-Aug-1945 Sex: female  REASON FOR CONSULT: Evaluate ICA dense calcification in the setting of recent left ACA stroke  HPI: Victoria Gutierrez is a 73 y.o. female, with history of hypertension as well as tobacco abuse that presents for evaluation of her carotid disease.  Patient was initially hospitalized back on July 25 after she presented with right arm weakness as a code stroke.  Ultimately she underwent extensive work-up including MRI of her brain that showed a small foci of acute ischemia within the left anterior cerebral artery circulation.  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  CTA neck showed atherosclerotic changes of the ICA without significant stenosis including no significant stenosis within the cavernous internal carotid arteries.  Neurology put her on dual antiplatelet therapy for 3 months.  Patient reports she had a TIA years ago with some slurred speech but no other focal symptoms like she has now.  She is trying to cut down her smoking.  Working with therapy and most of her strength is slowly returning.  Still feels weak on the right side.  Past Medical History:  Diagnosis Date  . Hypertension   . Stroke [facility])     Past Surgical History:  Procedure Laterality Date  . ABDOMINAL HYSTERECTOMY      Family History  Problem Relation Age of Onset  . Heart disease Mother   . Heart disease Father   . Breast cancer Neg Hx     SOCIAL HISTORY: Social History   Socioeconomic History  . Marital status: Divorced    Spouse name: Not on file  . Number of children: Not on file  . Years of education: Not on file  . Highest education level: Not on file  Occupational History  . Not on file  Social Needs  . Financial resource strain: Not hard at all  . Food insecurity    Worry: Patient refused    Inability: Patient refused  . Transportation needs    Medical: Patient refused    Non-medical: Patient  refused  Tobacco Use  . Smoking status: Current Every Day Smoker    Packs/day: 0.50    Years: 30.00    Pack years: 15.00    Types: Cigarettes    Start date: 03/10/1987  . Smokeless tobacco: Never Used  Substance and Sexual Activity  . Alcohol use: No  . Drug use: No  . Sexual activity: Not on file  Lifestyle  . Physical activity    Days per week: Patient refused    Minutes per session: Patient refused  . Stress: Not at all  Relationships  . Social Herbalist on phone: Patient refused    Gets together: Patient refused    Attends religious service: Patient refused    Active member of club or organization: Patient refused    Attends meetings of clubs or organizations: Patient refused    Relationship status: Patient refused  . Intimate partner violence    Fear of current or ex partner: Patient refused    Emotionally abused: Patient refused    Physically abused: Patient refused    Forced sexual activity: Patient refused  Other Topics Concern  . Not on file  Social History Narrative  . Not on file    No Known Allergies  Current Outpatient Medications  Medication Sig Dispense Refill  . aspirin EC 81 MG EC tablet Take 1 tablet (81 mg total) by mouth daily.  30 tablet 0  . atorvastatin (LIPITOR) 40 MG tablet Take 1 tablet (40 mg total) by mouth daily at 6 PM. 30 tablet 0  . citalopram (CELEXA) 20 MG tablet Take 20 mg by mouth daily.    . clopidogrel (PLAVIX) 75 MG tablet Take 1 tablet (75 mg total) by mouth daily. Take for 90 days 30 tablet 2  . hydrochlorothiazide (MICROZIDE) 12.5 MG capsule Take 12.5 mg by mouth daily.    Marland Kitchen lisinopril (ZESTRIL) 40 MG tablet Take 1 tablet (40 mg total) by mouth daily. 30 tablet 0  . Melatonin 5 MG TBDP Take 5 mg by mouth at bedtime.     . methotrexate (RHEUMATREX) 2.5 MG tablet Take 12.5 mg by mouth every Sunday.   2   No current facility-administered medications for this visit.     REVIEW OF SYSTEMS:  [X]  denotes positive  finding, [ ]  denotes negative finding Cardiac  Comments:  Chest pain or chest pressure:    Shortness of breath upon exertion:    Short of breath when lying flat:    Irregular heart rhythm:        Vascular    Pain in calf, thigh, or hip brought on by ambulation:    Pain in feet at night that wakes you up from your sleep:     Blood clot in your veins:    Leg swelling:         Pulmonary    Oxygen at home:    Productive cough:     Wheezing:         Neurologic    Sudden weakness in arms or legs:  x Previously - right arm  Sudden numbness in arms or legs:     Sudden onset of difficulty speaking or slurred speech:    Temporary loss of vision in one eye:     Problems with dizziness:  x       Gastrointestinal    Blood in stool:     Vomited blood:         Genitourinary    Burning when urinating:  x   Blood in urine:        Psychiatric    Major depression:         Hematologic    Bleeding problems:    Problems with blood clotting too easily:        Skin    Rashes or ulcers:        Constitutional    Fever or chills:      PHYSICAL EXAM: Vitals:   02/20/19 1529  BP: 122/80  Pulse: 79  Resp: 14  Temp: 98.5 F (36.9 C)  SpO2: 98%  Weight: 170 lb (77.1 kg)  Height: 5\' 6"  (1.676 m)    GENERAL: The patient is a well-nourished female, in no acute distress. The vital signs are documented above. CARDIAC: There is a regular rate and rhythm.  VASCULAR:  2+ carotid pulse bilaterally PULMONARY: There is good air exchange bilaterally without wheezing or rales. ABDOMEN: Soft and non-tender with normal pitched bowel sounds.  MUSCULOSKELETAL: There are no major deformities or cyanosis. NEUROLOGIC: Right upper and lower extremity 4+/5, left upper and lower extremity 5/5, CN II-XII grossly intact SKIN: There are no ulcers or rashes noted. PSYCHIATRIC: The patient has a normal affect.  DATA:   I independently reviewed CTA neck from 01/20/2019 and although the bilateral carotid  bifurcations are calcified I see no evidence of flow-limiting stenosis.  Assessment/Plan:  73 year old  female that was recently hospitalized with left ACA stroke and vascular surgery has been asked to evaluate her carotid disease as a possible etiology.  I reviewed her CTA and her bilateral carotid arteries including at the bifurcation and into the cavernous segment are calcified but there is no significant flow-limiting stenosis.  As reported by radiology there is atherosclerotic changes with no significant stenosis.  I do not think she needs any carotid intervention at this time as I explained to both the patient and her daughter.  We typically reserve carotid intervention for more than 50% stenosis in the setting of symptomatic lesion.  I simply do not see anything that warrants surgical intervention in her left carotid.  Offered follow-up in one year with repeat carotid duplex for ongoing surveillance.  She is worried about the right vertebral stenosis but discussed that this is not in the vascular distribution of where her stroke was and this is asymptomatic.   Marty Heck, MD [facility] Office: [phone] Pager: [phone]

## 2019-02-28 ENCOUNTER — Encounter: Payer: Self-pay | Admitting: Adult Health

## 2019-02-28 ENCOUNTER — Other Ambulatory Visit: Payer: Self-pay

## 2019-02-28 ENCOUNTER — Ambulatory Visit (INDEPENDENT_AMBULATORY_CARE_PROVIDER_SITE_OTHER): Payer: Medicare HMO | Admitting: Adult Health

## 2019-02-28 VITALS — BP 134/90 | HR 79 | Temp 96.6°F | Ht 66.0 in | Wt 170.2 lb

## 2019-02-28 DIAGNOSIS — Z720 Tobacco use: Secondary | ICD-10-CM | POA: Diagnosis not present

## 2019-02-28 DIAGNOSIS — E785 Hyperlipidemia, unspecified: Secondary | ICD-10-CM

## 2019-02-28 DIAGNOSIS — I10 Essential (primary) hypertension: Secondary | ICD-10-CM | POA: Diagnosis not present

## 2019-02-28 DIAGNOSIS — I63522 Cerebral infarction due to unspecified occlusion or stenosis of left anterior cerebral artery: Secondary | ICD-10-CM

## 2019-02-28 NOTE — Progress Notes (Signed)
I agree with the above plan 

## 2019-02-28 NOTE — Progress Notes (Signed)
[facility] [address] [place]. [place] [address] [phone]       HOSPITAL FOLLOW UP NOTE  Ms. [name] Date of Birth:  1946-05-01 Medical Record Number:  [id number]   Reason for Referral:  hospital stroke follow up    CHIEF COMPLAINT:  Chief Complaint  Patient presents with   Hospitalization Follow-up    Daughter present. Rm 9. No new concerns at this time.     HPI: Victoria Gutierrez being seen today for in office hospital follow-up regarding multiple small left ACA territory infarcts on 01/20/2019.  History obtained from patient, daughter and chart review. Reviewed all radiology images and labs personally.  Victoria Gutierrez is a 73 y.o. female with history of a TIA / stroke, ongoing tobacco use, and hypertension  presented to [facility] ED on 01/20/2019 with right sided weakness that started improving enroute.   Neurology consulted (Dr. Erlinda Hong) and stroke work-up revealed multiple small left ACA territory infarcts possibly secondary to large vessel disease source but unable to rule out cardioembolic source.  She did not receive IV t-PA due to improving / mild deficits.  MRI head showed left anterior cerebral anterior territory infarcts and severe chronic ischemic microangiopathy.  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  2D echo unremarkable without cardiac source of embolus or PFO.  Recommended undergoing 30-day cardiac event monitor outpatient to rule out atrial fibrillation.  Initiated DAPT for 3 months given intracranial stenosis/occlusion and aspirin alone.  HTN stable with recommendation of BP goal 1 30-1 50 given chronic left M1 occlusion.  LDL 105 initiate atorvastatin 40 mg daily.  A1c 5.7 without evidence of DM.  Current tobacco use of smoking  cessation counseling provided.  Other shortness factors include prior stroke, advanced age, THC use and overweight.  She was discharged home in stable condition with with recommendation of outpatient therapy for continued right lower extremity weakness.  Victoria Gutierrez is being seen today for hospital follow-up and is accompanied by her daughter.  She continues to have residual right-sided weakness but does endorse improvement.  Daughter states that initially upon returning home, she did have worsening of right-sided weakness impairing mobility and functional use of right side.  Once therapies were started approximately 1 week later, weakness significantly improved.  No other symptoms accompanied worsening weakness.  She was initially using a rolling walker but has now graduated to a cane that she will use outdoors and is typically not needed indoors.  She did have a couple falls initially returning to home but no falls recently.  She continues to participate in PT/OT along with maintaining HEP.  Continues aspirin 81 mg and clopidogrel 75 mg daily without bleeding or bruising.  Continues atorvastatin without myalgias.  Blood pressure today satisfactory at 134/90.  She completed 30-day cardiac event monitor yesterday and daughter plans on mailing back today or tomorrow.  Continues tobacco use but has drastically decreased from previously smoking 1 pack/day and now 3 to 5/day.  She does plan on continuation of decreasing now until complete cessation. She was evaluated by vascular surgery who did not recommend any surgical intervention at that time and to follow-up in 1 year for surveillance monitoring.  No further concerns at this time.    ROS:   14 system review of systems performed  and negative with exception of dizziness, weakness and tremors  PMH:  Past Medical History:  Diagnosis Date   Hypertension    Stroke ([place])     PSH:  Past Surgical History:  Procedure Laterality Date   ABDOMINAL  HYSTERECTOMY      Social History:  Social History   Socioeconomic History   Marital status: Divorced    Spouse name: Not on file   Number of children: Not on file   Years of education: Not on file   Highest education level: Not on file  Occupational History   Not on file  Social Needs   Financial resource strain: Not hard at all   Food insecurity    Worry: Patient refused    Inability: Patient refused   Transportation needs    Medical: Patient refused    Non-medical: Patient refused  Tobacco Use   Smoking status: Current Every Day Smoker    Packs/day: 0.50    Years: 30.00    Pack years: 15.00    Types: Cigarettes    Start date: 03/10/1987   Smokeless tobacco: Never Used  Substance and Sexual Activity   Alcohol use: No   Drug use: No   Sexual activity: Not on file  Lifestyle   Physical activity    Days per week: Patient refused    Minutes per session: Patient refused   Stress: Not at all  Relationships   Social connections    Talks on phone: Patient refused    Gets together: Patient refused    Attends religious service: Patient refused    Active member of club or organization: Patient refused    Attends meetings of clubs or organizations: Patient refused    Relationship status: Patient refused   Intimate partner violence    Fear of current or ex partner: Patient refused    Emotionally abused: Patient refused    Physically abused: Patient refused    Forced sexual activity: Patient refused  Other Topics Concern   Not on file  Social History Narrative   Not on file    Family History:  Family History  Problem Relation Age of Onset   Heart disease Mother    Heart disease Father    Breast cancer Neg Hx     Medications:   Current Outpatient Medications on File Prior to Visit  Medication Sig Dispense Refill   aspirin EC 81 MG EC tablet Take 1 tablet (81 mg total) by mouth daily. 30 tablet 0   atorvastatin (LIPITOR) 40 MG tablet Take  1 tablet (40 mg total) by mouth daily at 6 PM. 30 tablet 0   citalopram (CELEXA) 20 MG tablet Take 20 mg by mouth daily.     clopidogrel (PLAVIX) 75 MG tablet Take 1 tablet (75 mg total) by mouth daily. Take for 90 days 30 tablet 2   hydrochlorothiazide (MICROZIDE) 12.5 MG capsule Take 12.5 mg by mouth daily.     lisinopril (ZESTRIL) 10 MG tablet Take 10 mg by mouth daily.     Melatonin 5 MG TBDP Take 5 mg by mouth at bedtime.      methotrexate (RHEUMATREX) 2.5 MG tablet Take 12.5 mg by mouth every Sunday.   2   No current facility-administered medications on file prior to visit.     Allergies:  No Known Allergies   Physical Exam  Vitals:   02/28/19 1312  BP: 134/90  Pulse: 79  Temp: (!) 96.6 F (35.9 C)  TempSrc: Oral  Weight: 170  lb 3.2 oz (77.2 kg)  Height: 5\' 6"  (1.676 m)   Body mass index is 27.47 kg/m. No exam data present  General: well developed, well nourished,  pleasant elderly Caucasian female, seated, in no evident distress Head: head normocephalic and atraumatic.   Neck: supple with no carotid or supraclavicular bruits Cardiovascular: regular rate and rhythm, no murmurs Musculoskeletal: no deformity Skin:  no rash/petichiae Vascular:  Normal pulses all extremities   Neurologic Exam Mental Status: Awake and fully alert. Oriented to place and time. Recent and remote memory intact. Attention span, concentration and fund of knowledge appropriate. Mood and affect appropriate.  Cranial Nerves: Fundoscopic exam reveals sharp disc margins. Pupils equal, briskly reactive to light. Extraocular movements full without nystagmus. Visual fields full to confrontation. Hearing intact. Facial sensation intact. Face, tongue, palate moves normally and symmetrically.  Motor: Normal bulk and tone.  RUE: 5-/5 RLE: 4+/5 greatest in hip flexor Full strength left upper and lower extremity Sensory.: intact to touch , pinprick , position and vibratory sensation.  Coordination:  Rapid alternating movements normal in all extremities except slightly decreased RUE. Finger-to-nose and heel-to-shin performed accurately on left side with mild difficulty on right side. Gait and Station: Arises from chair without difficulty. Stance is normal. Gait demonstrates normal stride length with occasional difficulty of right leg coordination and assistance of cane Reflexes: 1+ and symmetric. Toes downgoing.     NIHSS  1 Modified Rankin  2    Diagnostic Data (Labs, Imaging, Testing)  Ct Angio Head W Or Wo Contrast Ct Angio Neck W Or Wo Contrast 01/20/2019 IMPRESSION:  1. 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  2. 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  3. Atherosclerotic changes of the proximal internal carotid arteries bilaterally without significant stenosis relative to the more distal vessels.  4. 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  5. Tandem stenosis of the right vertebral artery at the C6 level.  6. Dominant right thyroid nodule. This was noted on previous nuclear medicine study. Consider further evaluation with thyroid ultrasound. If patient is clinically hyperthyroid, consider nuclear medicine thyroid uptake and scan.  7. Atherosclerotic changes within the cavernous internal carotid arteries bilaterally without a significant stenosis.  8. Collateral flow to left MCA branches.  9. Hypoplastic basilar artery with distal narrowing. Fetal type posterior cerebral arteries are present.     Dg Chest 2 View 01/20/2019 IMPRESSION:  No active cardiopulmonary disease.    Mr Brain Wo Contrast 01/20/2019 IMPRESSION:  1. Multiple small foci of acute ischemia within the left anterior cerebral artery territory. No hemorrhage or mass effect.  2. Severe chronic ischemic microangiopathy.    Ct Head Code Stroke Wo Contrast 01/20/2019 IMPRESSION:  1. No acute intracranial abnormality or significant  interval change.  2. Remote lacunar infarcts involving the left caudate head and centrum semi ovale.  3. Advanced atrophy and diffuse white matter disease is otherwise stable.  4. ASPECTS is 10/10   Transthoracic Echocardiogram 01/21/2019 IMPRESSIONS  1. The left ventricle has a visually estimated ejection fraction of 55%. The cavity size was normal. There is mildly increased left ventricular wall thickness. Left ventricular diastolic Doppler parameters are consistent with impaired relaxation.  2. The right ventricle has normal systolic function. The cavity was normal. There is no increase in right ventricular wall thickness. Right ventricular systolic pressure could not be assessed.  3. The aortic valve is tricuspid. Mild aortic annular calcification noted.  4.  The mitral valve is grossly normal. Mild calcification of the mitral valve leaflet.  5. The tricuspid valve is grossly normal.  6. The aorta is normal in size and structure.   ECG - SR rate 87 BPM - RBBB - Old inferior Mi    ASSESSMENT: Victoria Gutierrez is a 73 y.o. year old female presented to [facility] ED with right-sided weakness on 01/20/2019 with stroke work-up showing multiple small left ACA territory infarcts possibly secondary to large vessel disease from left ICA bulb arthrosclerosis but unable to rule out cardioembolic source. Vascular risk factors include prior stroke, tobacco use, THC use, HTN, HLD, intra-and extracranial stenosis.  Residual deficits of mild right hemiparesis but overall recovering well    PLAN:  1. Multiple left ACA territory infarcts: Continue aspirin 81 mg daily  and Plavix for secondary stroke prevention.  Recommend continuation of DAPT until 04/24/2019 as at that time 30-month DAPT completed and can discontinue Plavix with continuation of aspirin alone.  Maintain strict control of hypertension with blood pressure goal below 130/90, diabetes with hemoglobin A1c goal below 6.5% and cholesterol with LDL  cholesterol (bad cholesterol) goal below 70 mg/dL.  I also advised the patient to eat a healthy diet with plenty of whole grains, cereals, fruits and vegetables, exercise regularly with at least 30 minutes of continuous activity daily and maintain ideal body weight.  30-day cardiac event monitor completed yesterday with pending results for potential atrial fibrillation 2. Large vessel disease: Continue DAPT for total of 3 months along with use of statin.  She will follow-up with vascular surgery Dr. Rollene Rotunda in 1 year for repeat surveillance monitoring 3. HTN: Advised to continue current treatment regimen.  Today's BP stable.  Advised to continue to monitor at home along with continued follow-up with PCP for management 4. HLD: Advised to continue current treatment regimen along with continued follow-up with PCP for future prescribing and monitoring of lipid panel 5. Residual deficits: Ongoing participation in home health therapies and continuation of HEP for ongoing improvement 6. Tobacco use: Highly encouraged ongoing decreasing daily amount until complete cessation for secondary stroke prevention    Follow up in 3 months or call earlier if needed   Greater than 50% of time during this 45 minute visit was spent on counseling, explanation of diagnosis of left ACA territory infarcts, reviewing risk factor management of large vessel disease, HTN, HLD and tobacco use, planning of further management along with potential future management, and discussion with patient and family answering all questions.    Frann Rider [facility]), AGNP-BC  [facility] [address] [place], [place] [address]  Phone [phone] Fax [phone] Note: This document was prepared with digital dictation and possible smart phrase technology. Any transcriptional errors that result from this process are unintentional.

## 2019-02-28 NOTE — Patient Instructions (Addendum)
Continue to work with home health therapies for ongoing improvement along with continuation of home exercises  Continue aspirin 81 mg daily and clopidogrel 75 mg daily  and Lipitor for secondary stroke prevention  Recommend continuation of aspirin and Plavix for a total of 3 months (04/24/2019) and at that time, you can discontinue Plavix and continue aspirin 81 mg alone  Continue to follow up with PCP regarding cholesterol and blood pressure management   Continue to monitor blood pressure at home to ensure top blood pressure reading maintains between 130-150 to ensure adequate perfusion  Follow-up with vascular surgery Dr. Donzetta Matters in 1 year for repeat imaging of your vessels  Keep decreasing tobacco use until complete cessation  Maintain strict control of hypertension with blood pressure goal 130-150, diabetes with hemoglobin A1c goal below 6.5% and cholesterol with LDL cholesterol (bad cholesterol) goal below 70 mg/dL. I also advised the patient to eat a healthy diet with plenty of whole grains, cereals, fruits and vegetables, exercise regularly and maintain ideal body weight.  Followup in the future with me in 3 months or call earlier if needed       Thank you for coming to see [place] at [facility]. I hope we have been able to provide you high quality care today.  You may receive a patient satisfaction survey over the next few weeks. We would appreciate your feedback and comments so that we may continue to improve ourselves and the health of our patients.    Stroke Prevention Some medical conditions and lifestyle choices can lead to a higher risk for a stroke. You can help to prevent a stroke by making nutrition, lifestyle, and other changes. What nutrition changes can be made?   Eat healthy foods. ? Choose foods that are high in fiber. These include:  Fresh fruits.  Fresh vegetables.  Whole grains. ? Eat at least 5 or more servings of fruits and vegetables  each day. Try to fill half of your plate at each meal with fruits and vegetables. ? Choose lean protein foods. These include:  Lowfat (lean) cuts of meat.  Chicken without skin.  Fish.  Tofu.  Beans.  Nuts. ? Eat low-fat dairy products. ? Avoid foods that:  Are high in salt (sodium).  Have saturated fat.  Have trans fat.  Have cholesterol.  Are processed.  Are premade.  Follow eating guidelines as told by your doctor. These may include: ? Reducing how many calories you eat and drink each day. ? Limiting how much salt you eat or drink each day to 1,500 milligrams (mg). ? Using only healthy fats for cooking. These include:  Olive oil.  Canola oil.  Sunflower oil. ? Counting how many carbohydrates you eat and drink each day. What lifestyle changes can be made?  Try to stay at a healthy weight. Talk to your doctor about what a good weight is for you.  Get at least 30 minutes of moderate physical activity at least 5 days a week. This can include: ? Fast walking. ? Biking. ? Swimming.  Do not use any products that have nicotine or tobacco. This includes cigarettes and e-cigarettes. If you need help quitting, ask your doctor. Avoid being around tobacco smoke in general.  Limit how much alcohol you drink to no more than 1 drink a day for nonpregnant women and 2 drinks a day for men. One drink equals 12 oz of beer, 5 oz of wine, or 1 oz of hard liquor.  Do not  use drugs.  Avoid taking birth control pills. Talk to your doctor about the risks of taking birth control pills if: ? You are over 18 years old. ? You smoke. ? You get migraines. ? You have had a blood clot. What other changes can be made?  Manage your cholesterol. ? It is important to eat a healthy diet. ? If your cholesterol cannot be managed through your diet, you may also need to take medicines. Take medicines as told by your doctor.  Manage your diabetes. ? It is important to eat a healthy diet  and to exercise regularly. ? If your blood sugar cannot be managed through diet and exercise, you may need to take medicines. Take medicines as told by your doctor.  Control your high blood pressure (hypertension). ? Try to keep your blood pressure below 130/80. This can help lower your risk of stroke. ? It is important to eat a healthy diet and to exercise regularly. ? If your blood pressure cannot be managed through diet and exercise, you may need to take medicines. Take medicines as told by your doctor. ? Ask your doctor if you should check your blood pressure at home. ? Have your blood pressure checked every year. Do this even if your blood pressure is normal.  Talk to your doctor about getting checked for a sleep disorder. Signs of this can include: ? Snoring a lot. ? Feeling very tired.  Take over-the-counter and prescription medicines only as told by your doctor. These may include aspirin or blood thinners (antiplatelets or anticoagulants).  Make sure that any other medical conditions you have are managed. Where to find more information  American Stroke Association: www.strokeassociation.org  National Stroke Association: www.stroke.org Get help right away if:  You have any symptoms of stroke. "BE FAST" is an easy way to remember the main warning signs: ? B - Balance. Signs are dizziness, sudden trouble walking, or loss of balance. ? E - Eyes. Signs are trouble seeing or a sudden change in how you see. ? F - Face. Signs are sudden weakness or loss of feeling of the face, or the face or eyelid drooping on one side. ? A - Arms. Signs are weakness or loss of feeling in an arm. This happens suddenly and usually on one side of the body. ? S - Speech. Signs are sudden trouble speaking, slurred speech, or trouble understanding what people say. ? T - Time. Time to call emergency services. Write down what time symptoms started.  You have other signs of stroke, such as: ? A sudden, very  bad headache with no known cause. ? Feeling sick to your stomach (nausea). ? Throwing up (vomiting). ? Jerky movements you cannot control (seizure). These symptoms may represent a serious problem that is an emergency. Do not wait to see if the symptoms will go away. Get medical help right away. Call your local emergency services (911 in the [place] Do not drive yourself to the hospital. Summary  You can prevent a stroke by eating healthy, exercising, not smoking, drinking less alcohol, and treating other health problems, such as diabetes, high blood pressure, or high cholesterol.  Do not use any products that contain nicotine or tobacco, such as cigarettes and e-cigarettes.  Get help right away if you have any signs or symptoms of a stroke. This information is not intended to replace advice given to you by your health care provider. Make sure you discuss any questions you have with your health care provider.  Document Released: 12/14/2011 Document Revised: 08/10/2018 Document Reviewed: 09/15/2016 Elsevier Patient Education  2020 [employer].

## 2019-03-01 ENCOUNTER — Other Ambulatory Visit: Payer: Self-pay

## 2019-03-01 ENCOUNTER — Other Ambulatory Visit: Payer: Self-pay | Admitting: Cardiology

## 2019-03-01 DIAGNOSIS — I639 Cerebral infarction, unspecified: Secondary | ICD-10-CM

## 2019-03-01 DIAGNOSIS — I4891 Unspecified atrial fibrillation: Secondary | ICD-10-CM

## 2019-03-07 ENCOUNTER — Other Ambulatory Visit: Payer: Self-pay

## 2019-03-07 ENCOUNTER — Encounter: Payer: Self-pay | Admitting: Urology

## 2019-03-07 ENCOUNTER — Ambulatory Visit (INDEPENDENT_AMBULATORY_CARE_PROVIDER_SITE_OTHER): Payer: Medicare HMO | Admitting: Urology

## 2019-03-07 VITALS — BP 109/66 | HR 85 | Ht 66.0 in | Wt 170.0 lb

## 2019-03-07 DIAGNOSIS — R3915 Urgency of urination: Secondary | ICD-10-CM

## 2019-03-07 DIAGNOSIS — R3129 Other microscopic hematuria: Secondary | ICD-10-CM

## 2019-03-07 DIAGNOSIS — R310 Gross hematuria: Secondary | ICD-10-CM | POA: Diagnosis not present

## 2019-03-07 NOTE — Progress Notes (Signed)
03/07/2019 11:28 AM   Victoria Gutierrez [birth year] [id number]  Referring provider: [place] Pink, MD [address] [facility] [place],  [place] [address]  Chief Complaint  Patient presents with   Hematuria    New patient    HPI:  Victoria Gutierrez was referred over for hematuria.  She has a urinalysis 1 month ago and 11 months ago that has 6-10 red blood cells and greater than 50 red blood cells respectively.  An February 13, 2019 urinalysis also showed 10-50 red blood cells with a mixed culture. She has CKD stage III. CT 03/18/2018 was benign. Comment on slight left renal atrophy noted. She has no gross hematuria. She is working on quitting smoking. I encouraged her to stop and we discussed risk of GU cancers. No pelvic xrt. She was also around hair day/chemicals.   She also has frequency. She had a CVA July 2020. She has right side weakness. No constipation. She voids with a good flow, but also describes straining and hesitancy. She had urgency. She leaked at night and noc x 3. She took abx and her symptoms did not resolve. She held her HCTZ and noc x 1.   UA today with 0-5 wbc and 0-2 rbc. Mod bacteria.   Modifying factors: There are no other modifying factors  Associated signs and symptoms: There are no other associated signs and symptoms Aggravating and relieving factors: There are no other aggravating or relieving factors Severity: Moderate Duration: Persistent   PMH: Past Medical History:  Diagnosis Date   Hypertension    Stroke [facility])     Surgical History: Past Surgical History:  Procedure Laterality Date   ABDOMINAL HYSTERECTOMY      Home Medications:  Allergies as of 03/07/2019   No Known Allergies     Medication List       Accurate as of March 07, 2019 11:28 AM. If you have any questions, ask your nurse or doctor.        aspirin 81 MG EC tablet Take 1 tablet (81 mg total) by mouth daily.   atorvastatin 40 MG tablet Commonly known as:  LIPITOR Take 1 tablet (40 mg total) by mouth daily at 6 PM.   citalopram 20 MG tablet Commonly known as: CELEXA Take 20 mg by mouth daily.   clopidogrel 75 MG tablet Commonly known as: PLAVIX Take 1 tablet (75 mg total) by mouth daily. Take for 90 days   hydrochlorothiazide 12.5 MG capsule Commonly known as: MICROZIDE Take 12.5 mg by mouth daily.   lisinopril 10 MG tablet Commonly known as: ZESTRIL Take 10 mg by mouth daily.   Melatonin 5 MG Tbdp Take 5 mg by mouth at bedtime.   methotrexate 2.5 MG tablet Commonly known as: RHEUMATREX Take 12.5 mg by mouth every Sunday.       Allergies: No Known Allergies  Family History: Family History  Problem Relation Age of Onset   Heart disease Mother    Heart disease Father    Breast cancer Neg Hx     Social History:  reports that she has been smoking cigarettes. She started smoking about 32 years ago. She has a 15.00 pack-year smoking history. She has never used smokeless tobacco. She reports that she does not drink alcohol or use drugs.  ROS:  Physical Exam: There were no vitals taken for this visit.  Constitutional:  Alert and oriented, No acute distress. HEENT: [place] AT, moist mucus membranes.  Trachea midline, no masses. Cardiovascular: No clubbing, cyanosis, or edema. Respiratory: Normal respiratory effort, no increased work of breathing. GI: Abdomen is soft, nontender, nondistended, no abdominal masses Skin: No rashes, bruises or suspicious lesions. Neurologic: Grossly intact, no focal deficits, moving all 4 extremities. Psychiatric: Normal mood and affect.   Laboratory Data: Lab Results  Component Value Date   WBC 11.2 (H) 01/20/2019   HGB 14.3 01/20/2019   HCT 42.0 01/20/2019   MCV 102.3 (H) 01/20/2019   PLT 223 01/20/2019    Lab Results  Component Value Date   CREATININE 1.20 (H) 01/22/2019    No results found for: PSA  No results  found for: TESTOSTERONE  Lab Results  Component Value Date   HGBA1C 5.7 (H) 01/21/2019    Urinalysis    Component Value Date/Time   COLORURINE YELLOW 01/20/2019 2111   APPEARANCEUR HAZY (A) 01/20/2019 2111   APPEARANCEUR Hazy 08/11/2011 2253   LABSPEC 1.032 (H) 01/20/2019 2111   LABSPEC 1.011 08/11/2011 2253   PHURINE 8.0 01/20/2019 2111   GLUCOSEU NEGATIVE 01/20/2019 2111   GLUCOSEU Negative 08/11/2011 2253   HGBUR SMALL (A) 01/20/2019 2111   BILIRUBINUR NEGATIVE 01/20/2019 2111   BILIRUBINUR Negative 08/11/2011 2253   [facility] [facility] NEGATIVE 01/20/2019 2111   PROTEINUR NEGATIVE 01/20/2019 2111   NITRITE NEGATIVE 01/20/2019 2111   LEUKOCYTESUR NEGATIVE 01/20/2019 2111   LEUKOCYTESUR Negative 08/11/2011 2253    Lab Results  Component Value Date   BACTERIA RARE (A) 01/20/2019    Pertinent Imaging: CT 2019  No results found for this or any previous visit. No results found for this or any previous visit. No results found for this or any previous visit. No results found for this or any previous visit. No results found for this or any previous visit. No results found for this or any previous visit. No results found for this or any previous visit. No results found for this or any previous visit.  Assessment & Plan:    1. Microscopic hematuria - although she did have a CT in 2019, she is considered high risk with age, smoking and symptoms and needs another scan. We discussed it and to drink plenty of water before and after.   2. Frequency and urgency -  eval as above . Consider OAB meds or PT/OT. She'll check with Dr. Kary Kos about taking HCTZ in the AM. We discussed bacteria in the urine is very typical and does not require abx (wont prevent future symptomatic UTI and can be protective of UTI).   - Urinalysis, Complete   No follow-ups on file.  Festus Aloe, MD  [facility] [address], [place], [place] [address] [phone]

## 2019-03-07 NOTE — Patient Instructions (Addendum)
Cystoscopy Cystoscopy is a procedure that is used to help diagnose and sometimes treat conditions that affect the lower urinary tract. The lower urinary tract includes the bladder and the urethra. The urethra is the tube that drains urine from the bladder. Cystoscopy is done using a thin, tube-shaped instrument with a light and camera at the end (cystoscope). The cystoscope may be hard or flexible, depending on the goal of the procedure. The cystoscope is inserted through the urethra, into the bladder. Cystoscopy may be recommended if you have:  Urinary tract infections that keep coming back.  Blood in the urine (hematuria).  An inability to control when you urinate (urinary incontinence) or an overactive bladder.  Unusual cells found in a urine sample.  A blockage in the urethra, such as a urinary stone.  Painful urination.  An abnormality in the bladder found during an intravenous pyelogram (IVP) or CT scan. Cystoscopy may also be done to remove a sample of tissue to be examined under a microscope (biopsy). Tell a health care provider about:  Any allergies you have.  All medicines you are taking, including vitamins, herbs, eye drops, creams, and over-the-counter medicines.  Any problems you or family members have had with anesthetic medicines.  Any blood disorders you have.  Any surgeries you have had.  Any medical conditions you have.  Whether you are pregnant or may be pregnant. What are the risks? Generally, this is a safe procedure. However, problems may occur, including:  Infection.  Bleeding.  Allergic reactions to medicines.  Damage to other structures or organs. What happens before the procedure?  Ask your health care provider about: ? Changing or stopping your regular medicines. This is especially important if you are taking diabetes medicines or blood thinners. ? Taking medicines such as aspirin and ibuprofen. These medicines can thin your blood. Do not take  these medicines unless your health care provider tells you to take them. ? Taking over-the-counter medicines, vitamins, herbs, and supplements.  Follow instructions from your health care provider about eating or drinking restrictions.  Ask your health care provider what steps will be taken to help prevent infection. These may include: ? Washing skin with a germ-killing soap. ? Taking antibiotic medicine.  You may have an exam or testing, such as: ? X-rays of the bladder, urethra, or kidneys. ? Urine tests to check for signs of infection.  Plan to have someone take you home from the hospital or clinic. What happens during the procedure?   You will be given one or more of the following: ? A medicine to help you relax (sedative). ? A medicine to numb the area (local anesthetic).  The area around the opening of your urethra will be cleaned.  The cystoscope will be passed through your urethra into your bladder.  Germ-free (sterile) fluid will flow through the cystoscope to fill your bladder. The fluid will stretch your bladder so that your health care provider can clearly examine your bladder walls.  Your doctor will look at the urethra and bladder. Your doctor may take a biopsy or remove stones.  The cystoscope will be removed, and your bladder will be emptied. The procedure may vary among health care providers and hospitals. What can I expect after the procedure? After the procedure, it is common to have:  Some soreness or pain in your abdomen and urethra.  Urinary symptoms. These include: ? Mild pain or burning when you urinate. Pain should stop within a few minutes after you urinate. This   may last for up to 1 week. ? A small amount of blood in your urine for several days. ? Feeling like you need to urinate but producing only a small amount of urine. Follow these instructions at home: Medicines  Take over-the-counter and prescription medicines only as told by your health care  provider.  If you were prescribed an antibiotic medicine, take it as told by your health care provider. Do not stop taking the antibiotic even if you start to feel better. General instructions  Return to your normal activities as told by your health care provider. Ask your health care provider what activities are safe for you.  Do not drive for 24 hours if you were given a sedative during your procedure.  Watch for any blood in your urine. If the amount of blood in your urine increases, call your health care provider.  Follow instructions from your health care provider about eating or drinking restrictions.  If a tissue sample was removed for testing (biopsy) during your procedure, it is up to you to get your test results. Ask your health care provider, or the department that is doing the test, when your results will be ready.  Drink enough fluid to keep your urine pale yellow.  Keep all follow-up visits as told by your health care provider. This is important. Contact a health care provider if you:  Have pain that gets worse or does not get better with medicine, especially pain when you urinate.  Have trouble urinating.  Have more blood in your urine. Get help right away if you:  Have blood clots in your urine.  Have abdominal pain.  Have a fever or chills.  Are unable to urinate. Summary  Cystoscopy is a procedure that is used to help diagnose and sometimes treat conditions that affect the lower urinary tract.  Cystoscopy is done using a thin, tube-shaped instrument with a light and camera at the end.  After the procedure, it is common to have some soreness or pain in your abdomen and urethra.  Watch for any blood in your urine. If the amount of blood in your urine increases, call your health care provider.  If you were prescribed an antibiotic medicine, take it as told by your health care provider. Do not stop taking the antibiotic even if you start to feel better. This  information is not intended to replace advice given to you by your health care provider. Make sure you discuss any questions you have with your health care provider. Document Released: 06/11/2000 Document Revised: 06/06/2018 Document Reviewed: 06/06/2018 Elsevier Patient Education  2020 [employer]   Hematuria, Adult Hematuria is blood in the urine. Blood may be visible in the urine, or it may be identified with a test. This condition can be caused by infections of the bladder, urethra, kidney, or prostate. Other possible causes include:  Kidney stones.  Cancer of the urinary tract.  Too much calcium in the urine.  Conditions that are passed from parent to child (inherited conditions).  Exercise that requires a lot of energy. Infections can usually be treated with medicine, and a kidney stone usually will pass through your urine. If neither of these is the cause of your hematuria, more tests may be needed to identify the cause of your symptoms. It is very important to tell your health care provider about any blood in your urine, even if it is painless or the blood stops without treatment. Blood in the urine, when it happens and   then stops and then happens again, can be a symptom of a very serious condition, including cancer. There is no pain in the initial stages of many urinary cancers. Follow these instructions at home: Medicines  Take over-the-counter and prescription medicines only as told by your health care provider.  If you were prescribed an antibiotic medicine, take it as told by your health care provider. Do not stop taking the antibiotic even if you start to feel better. Eating and drinking  Drink enough fluid to keep your urine clear or pale yellow. It is recommended that you drink 3-4 quarts (2.8-3.8 L) a day. If you have been diagnosed with an infection, it is recommended that you drink cranberry juice in addition to large amounts of water.  Avoid caffeine, tea, and  carbonated beverages. These tend to irritate the bladder.  Avoid alcohol because it may irritate the prostate (men). General instructions  If you have been diagnosed with a kidney stone, follow your health care provider's instructions about straining your urine to catch the stone.  Empty your bladder often. Avoid holding urine for long periods of time.  If you are female: ? After a bowel movement, wipe from front to back and use each piece of toilet paper only once. ? Empty your bladder before and after sex.  Pay attention to any changes in your symptoms. Tell your health care provider about any changes or any new symptoms.  It is your responsibility to get your test results. Ask your health care provider, or the department performing the test, when your results will be ready.  Keep all follow-up visits as told by your health care provider. This is important. Contact a health care provider if:  You develop back pain.  You have a fever.  You have nausea or vomiting.  Your symptoms do not improve after 3 days.  Your symptoms get worse. Get help right away if:  You develop severe vomiting and are unable take medicine without vomiting.  You develop severe pain in your back or abdomen even though you are taking medicine.  You pass a large amount of blood in your urine.  You pass blood clots in your urine.  You feel very weak or like you might faint.  You faint. Summary  Hematuria is blood in the urine. It has many possible causes.  It is very important that you tell your health care provider about any blood in your urine, even if it is painless or the blood stops without treatment.  Take over-the-counter and prescription medicines only as told by your health care provider.  Drink enough fluid to keep your urine clear or pale yellow. This information is not intended to replace advice given to you by your health care provider. Make sure you discuss any questions you have  with your health care provider. Document Released: 06/14/2005 Document Revised: 05/27/2017 Document Reviewed: 07/17/2016 Elsevier Patient Education  2020 [employer] Inc.   

## 2019-03-08 LAB — MICROSCOPIC EXAMINATION: Epithelial Cells (non renal): >10 /hpf — AB (ref 0–10)

## 2019-03-08 LAB — URINALYSIS, COMPLETE
Bilirubin, UA: NEGATIVE
Glucose, UA: NEGATIVE
Ketones, UA: NEGATIVE
Leukocytes,UA: NEGATIVE
Nitrite, UA: NEGATIVE
Specific Gravity, UA: 1.025 (ref 1.005–1.030)
Urobilinogen, Ur: 0.2 mg/dL (ref 0.2–1.0)
pH, UA: 5.5 (ref 5.0–7.5)

## 2019-04-04 ENCOUNTER — Encounter: Payer: Self-pay | Admitting: Urology

## 2019-04-04 ENCOUNTER — Emergency Department: Payer: Medicare HMO

## 2019-04-04 ENCOUNTER — Other Ambulatory Visit: Payer: Medicare HMO | Admitting: Urology

## 2019-04-04 ENCOUNTER — Inpatient Hospital Stay
Admission: EM | Admit: 2019-04-04 | Discharge: 2019-04-09 | DRG: 522 | Disposition: A | Discharge status: Home Health | Payer: Medicare HMO | Attending: Internal Medicine | Admitting: Internal Medicine

## 2019-04-04 ENCOUNTER — Other Ambulatory Visit: Payer: Self-pay

## 2019-04-04 DIAGNOSIS — S72002A Fracture of unspecified part of neck of left femur, initial encounter for closed fracture: Secondary | ICD-10-CM

## 2019-04-04 DIAGNOSIS — E785 Hyperlipidemia, unspecified: Secondary | ICD-10-CM | POA: Diagnosis present

## 2019-04-04 DIAGNOSIS — Z7982 Long term (current) use of aspirin: Secondary | ICD-10-CM | POA: Diagnosis not present

## 2019-04-04 DIAGNOSIS — I10 Essential (primary) hypertension: Secondary | ICD-10-CM | POA: Diagnosis present

## 2019-04-04 DIAGNOSIS — S72092A Other fracture of head and neck of left femur, initial encounter for closed fracture: Secondary | ICD-10-CM | POA: Diagnosis not present

## 2019-04-04 DIAGNOSIS — M25559 Pain in unspecified hip: Secondary | ICD-10-CM

## 2019-04-04 DIAGNOSIS — Z03818 Encounter for observation for suspected exposure to other biological agents ruled out: Secondary | ICD-10-CM | POA: Diagnosis not present

## 2019-04-04 DIAGNOSIS — Y92009 Unspecified place in unspecified non-institutional (private) residence as the place of occurrence of the external cause: Secondary | ICD-10-CM

## 2019-04-04 DIAGNOSIS — W010XXA Fall on same level from slipping, tripping and stumbling without subsequent striking against object, initial encounter: Secondary | ICD-10-CM | POA: Diagnosis present

## 2019-04-04 DIAGNOSIS — D72829 Elevated white blood cell count, unspecified: Secondary | ICD-10-CM | POA: Diagnosis not present

## 2019-04-04 DIAGNOSIS — W19XXXA Unspecified fall, initial encounter: Secondary | ICD-10-CM | POA: Diagnosis not present

## 2019-04-04 DIAGNOSIS — M1612 Unilateral primary osteoarthritis, left hip: Secondary | ICD-10-CM | POA: Diagnosis present

## 2019-04-04 DIAGNOSIS — Z8673 Personal history of transient ischemic attack (TIA), and cerebral infarction without residual deficits: Secondary | ICD-10-CM

## 2019-04-04 DIAGNOSIS — F17210 Nicotine dependence, cigarettes, uncomplicated: Secondary | ICD-10-CM | POA: Diagnosis present

## 2019-04-04 DIAGNOSIS — Z471 Aftercare following joint replacement surgery: Secondary | ICD-10-CM | POA: Diagnosis not present

## 2019-04-04 DIAGNOSIS — Z96642 Presence of left artificial hip joint: Secondary | ICD-10-CM | POA: Diagnosis not present

## 2019-04-04 DIAGNOSIS — Z20828 Contact with and (suspected) exposure to other viral communicable diseases: Secondary | ICD-10-CM | POA: Diagnosis present

## 2019-04-04 DIAGNOSIS — G8918 Other acute postprocedural pain: Secondary | ICD-10-CM

## 2019-04-04 DIAGNOSIS — R52 Pain, unspecified: Secondary | ICD-10-CM | POA: Diagnosis not present

## 2019-04-04 DIAGNOSIS — Z7902 Long term (current) use of antithrombotics/antiplatelets: Secondary | ICD-10-CM | POA: Diagnosis not present

## 2019-04-04 DIAGNOSIS — N179 Acute kidney failure, unspecified: Secondary | ICD-10-CM | POA: Diagnosis present

## 2019-04-04 DIAGNOSIS — S72002D Fracture of unspecified part of neck of left femur, subsequent encounter for closed fracture with routine healing: Secondary | ICD-10-CM | POA: Diagnosis not present

## 2019-04-04 DIAGNOSIS — Z419 Encounter for procedure for purposes other than remedying health state, unspecified: Secondary | ICD-10-CM

## 2019-04-04 DIAGNOSIS — S7292XA Unspecified fracture of left femur, initial encounter for closed fracture: Secondary | ICD-10-CM | POA: Diagnosis not present

## 2019-04-04 DIAGNOSIS — Z79899 Other long term (current) drug therapy: Secondary | ICD-10-CM | POA: Diagnosis not present

## 2019-04-04 DIAGNOSIS — M25552 Pain in left hip: Secondary | ICD-10-CM | POA: Diagnosis not present

## 2019-04-04 DIAGNOSIS — Z8249 Family history of ischemic heart disease and other diseases of the circulatory system: Secondary | ICD-10-CM

## 2019-04-04 DIAGNOSIS — I517 Cardiomegaly: Secondary | ICD-10-CM | POA: Diagnosis not present

## 2019-04-04 DIAGNOSIS — S72042A Displaced fracture of base of neck of left femur, initial encounter for closed fracture: Secondary | ICD-10-CM | POA: Diagnosis not present

## 2019-04-04 LAB — BASIC METABOLIC PANEL
Anion gap: 7 (ref 5–15)
BUN: 16 mg/dL (ref 8–23)
CO2: 27 mmol/L (ref 22–32)
Calcium: 8.9 mg/dL (ref 8.9–10.3)
Chloride: 106 mmol/L (ref 98–111)
Creatinine, Ser: 1.16 mg/dL — ABNORMAL HIGH (ref 0.44–1.00)
GFR calc Af Amer: 54 mL/min — ABNORMAL LOW (ref >60)
GFR calc non Af Amer: 47 mL/min — ABNORMAL LOW (ref >60)
Glucose, Bld: 106 mg/dL — ABNORMAL HIGH (ref 70–99)
Potassium: 4 mmol/L (ref 3.5–5.1)
Sodium: 140 mmol/L (ref 135–145)

## 2019-04-04 LAB — CBC
HCT: 38.4 % (ref 36.0–46.0)
Hemoglobin: 12.7 g/dL (ref 12.0–15.0)
MCH: 33.6 pg (ref 26.0–34.0)
MCHC: 33.1 g/dL (ref 30.0–36.0)
MCV: 101.6 fL — ABNORMAL HIGH (ref 80.0–100.0)
Platelets: 259 10*3/uL (ref 150–400)
RBC: 3.78 MIL/uL — ABNORMAL LOW (ref 3.87–5.11)
RDW: 14.5 % (ref 11.5–15.5)
WBC: 15.4 10*3/uL — ABNORMAL HIGH (ref 4.0–10.5)
nRBC: 0 % (ref 0.0–0.2)

## 2019-04-04 MED ORDER — MORPHINE SULFATE (PF) 4 MG/ML IV SOLN
INTRAVENOUS | Status: AC
  Administered 2019-04-04: 4 mg via INTRAVENOUS
  Filled 2019-04-04: qty 1

## 2019-04-04 MED ORDER — MORPHINE SULFATE (PF) 4 MG/ML IV SOLN
4.0000 mg | Freq: Once | INTRAVENOUS | Status: AC
  Administered 2019-04-04: 4 mg via INTRAVENOUS

## 2019-04-04 NOTE — ED Triage Notes (Signed)
PT to ED via EMS from home. PT was going up her step in back yard, missed a step, lost her balance and fell left hip onto concrete. PT unable to straighten leg

## 2019-04-04 NOTE — H&P (Signed)
[facility] NAME: Victoria Gutierrez    MR#:  [id number]  DATE OF BIRTH:  [birth year]  DATE OF ADMISSION:  04/04/2019  PRIMARY CARE PHYSICIAN: Maryland Pink, MD   REQUESTING/REFERRING PHYSICIAN: Harvest Dark, MD  CHIEF COMPLAINT:   Chief Complaint  Patient presents with  . Fall    HISTORY OF PRESENT ILLNESS:  74 y.o. female with pertinent past medical history of hypertension, CVA and hyperlipidemia presenting to the ED with complaints of left hip pain status post mechanical fall.  Patient states she was walking into her house, states she tripped on the front step causing her to fall forward landing on her left hip.  Now complaining of pain to the left hip worse with movement.  She also has a bruise on her knee. She is unable to straighten her left leg due to pain.  Patient denies hitting her head or LOC.  Denies any other associated symptoms.  On arrival to the ED, She was afebrile with blood pressure 171/100 mm Hg and pulse rate 119 beats/min. There were no focal neurological deficits; She was alert and oriented x4, and he did not demonstrate any memory deficits.  Initial labs revealed WBC 15.4, MCV 101.6, glucose 143, creatinine 1.16, COVID negative.  Chest x-ray shows no acute cardiopulmonary process.  X-ray of the left hip shows cumulated displaced left femoral head neck fracture.  Given this finding hospitalist asked to admit for further management.  PAST MEDICAL HISTORY:   Past Medical History:  Diagnosis Date  . Hypertension   . Stroke [facility])     PAST SURGICAL HISTORY:   Past Surgical History:  Procedure Laterality Date  . ABDOMINAL HYSTERECTOMY      SOCIAL HISTORY:   Social History   Tobacco Use  . Smoking status: Current Every Day Smoker    Packs/day: 0.25    Years: 30.00    Pack years: 7.50    Types: Cigarettes    Start date: 03/10/1987  . Smokeless tobacco: Never Used  Substance Use Topics  . Alcohol  use: No    FAMILY HISTORY:   Family History  Problem Relation Age of Onset  . Heart disease Mother   . Heart disease Father   . Breast cancer Neg Hx     DRUG ALLERGIES:  No Known Allergies  REVIEW OF SYSTEMS:   Review of Systems  Constitutional: Negative for chills, fever, malaise/fatigue and weight loss.  HENT: Negative for congestion, hearing loss and sore throat.   Eyes: Negative for blurred vision and double vision.  Respiratory: Negative for cough, shortness of breath and wheezing.   Cardiovascular: Negative for chest pain, palpitations, orthopnea and leg swelling.  Gastrointestinal: Negative for abdominal pain, diarrhea, nausea and vomiting.  Genitourinary: Negative for dysuria and urgency.  Musculoskeletal: Positive for falls. Negative for myalgias.       Left hip pain  Skin: Negative for rash.  Neurological: Negative for dizziness, sensory change, speech change, focal weakness and headaches.  Psychiatric/Behavioral: Negative for depression.    MEDICATIONS AT HOME:   Prior to Admission medications   Medication Sig Start Date End Date Taking? Authorizing Provider  aspirin EC 81 MG EC tablet Take 1 tablet (81 mg total) by mouth daily. 01/23/19   Mariel Aloe, MD  atorvastatin (LIPITOR) 40 MG tablet Take 1 tablet (40 mg total) by mouth daily at 6 PM. 01/22/19   Mariel Aloe, MD  citalopram (CELEXA) 20 MG tablet Take  20 mg by mouth daily.    [provider]  clopidogrel (PLAVIX) 75 MG tablet Take 1 tablet (75 mg total) by mouth daily. Take for 90 days 01/23/19 04/23/19  Mariel Aloe, MD  hydrochlorothiazide (MICROZIDE) 12.5 MG capsule Take 12.5 mg by mouth daily.    [provider]  lisinopril (ZESTRIL) 10 MG tablet Take 10 mg by mouth daily.    [provider]  Melatonin 5 MG TBDP Take 5 mg by mouth at bedtime.  05/12/16   [provider]  methotrexate (RHEUMATREX) 2.5 MG tablet Take 12.5 mg by mouth every Sunday.  02/22/18    [provider]      VITAL SIGNS:  Temperature 99 F (37.2 C), temperature source Oral, height 5\' 6"  (1.676 m), weight 76.7 kg.  PHYSICAL EXAMINATION:   Physical Exam  GENERAL:  73 y.o.-year-old patient lying in the bed with no acute distress.  EYES: Pupils equal, round, reactive to light and accommodation. No scleral icterus. Extraocular muscles intact.  HEENT: Head atraumatic, normocephalic. Oropharynx and nasopharynx clear.  NECK:  Supple, no jugular venous distention. No thyroid enlargement, no tenderness.  LUNGS: Normal breath sounds bilaterally, no wheezing, rales,rhonchi or crepitation. No use of accessory muscles of respiration.  CARDIOVASCULAR: S1, S2 normal. No murmurs, rubs, or gallops.  ABDOMEN: Soft, nontender, nondistended. Bowel sounds present. No organomegaly or mass.  EXTREMITIES: No pedal edema, cyanosis, or clubbing.  NEUROLOGIC: Cranial nerves II through XII are intact. Muscle strength 5/5 in all  Upper and right lower extremities. Unable to assess Left lower leg due to injury. Sensation intact. Gait not checked.  PSYCHIATRIC: The patient is alert and oriented x 3.  SKIN: No obvious rash, lesion, or ulcer.   DATA REVIEWED:  LABORATORY PANEL:   CBC Recent Labs  Lab 04/04/19 2155  WBC 15.4*  HGB 12.7  HCT 38.4  PLT 259   ------------------------------------------------------------------------------------------------------------------  Chemistries  Recent Labs  Lab 04/04/19 2155  NA 140  K 4.0  CL 106  CO2 27  GLUCOSE 106*  BUN 16  CREATININE 1.16*  CALCIUM 8.9   ------------------------------------------------------------------------------------------------------------------  Cardiac Enzymes No results for input(s): TROPONINI in the last 168 hours. ------------------------------------------------------------------------------------------------------------------  RADIOLOGY:  Dg Chest 1 View  Result Date: 04/04/2019 CLINICAL DATA:   Hip fracture EXAM: CHEST  1 VIEW COMPARISON:  January 20, 2019 FINDINGS: There is mild cardiomegaly. Aortic knob calcifications. Both lungs are clear. The visualized skeletal structures are unremarkable. IMPRESSION: No acute cardiopulmonary process. Electronically Signed   By: Prudencio Pair M.D.   On: 04/04/2019 22:38   Dg Hip Unilat With Pelvis 2-3 Views Left  Result Date: 04/04/2019 CLINICAL DATA:  Fall, pain EXAM: DG HIP (WITH OR WITHOUT PELVIS) 2-3V LEFT COMPARISON:  None. FINDINGS: There is a comminuted mildly displaced left femoral head neck fracture. There is superior displacement of the femoral shaft. The femoral head still articulates with the acetabulum. There is a fracture fragment seen along the medial femoral neck. Dense vascular calcifications are seen. No other fractures identified. IMPRESSION: Comminuted mildly superiorly displaced left femoral head neck junction fracture. Electronically Signed   By: Prudencio Pair M.D.   On: 04/04/2019 22:37    EKG:  EKG: normal EKG, normal sinus rhythm, unchanged from previous tracings, RBBB. Vent. rate 75 BPM PR interval * ms QRS duration 154 ms QT/QTc 485/542 ms P-R-T axes 81 62 44  IMPRESSION AND PLAN:   73 y.o. female with pertinent past medical history of hypertension, CVA and hyperlipidemia  presenting to the ED with complaints of left hip pain status post mechanical fall.  1. Left femoral neck fracture -secondary to mechanical fall - Admit to MedSurg unit - PRN IV and p.o. pain management - IV fluids hydration - Keep n.p.o. for possible surgery - Patient medically cleared for surgery - Orthopedic consult.  Discussed with Dr. Rudene Christians for possible surgery   2.  Leukocytosis: WBC 15.4 likely reactive in the setting of stress from mechanical fall - Obtain UA  - CXR shows no active cardiopulmonary process - Trend WBC's - Check Procalcitonin  3. Acute on chronic kidney injury - Hold nephrotoxins - IV fluids hydration - Continue to monitor  renal function  4. Hypertension - Hold lisinopril in the setting of AKI - Continue low-dose hydrochlorothiazide as creatinine slightly improved from previous - PRN labetalol  5. History of CVA - Hold aspirin and Plavix for possible procedure  6. HLD  + Goal LDL<100 - Atorvastatin 40mg  PO qhs  7. DVT prophylaxis - Hold anti-coagulation for pending procedure    All the records are reviewed and case discussed with ED provider. Management plans discussed with the patient, family and they are in agreement.  CODE STATUS: FULL  TOTAL TIME TAKING CARE OF THIS PATIENT:50 minutes.    on 04/04/2019 at 11:52 PM  Rufina Falco, DNP, FNP-BC [facility] Nurse Practitioner Between 7am to 6pm - Pager [phone]  After 6pm go to [email or web address] - password [email or web address] [facility]  Office  [phone]  CC: Primary care physician; Maryland Pink, MD

## 2019-04-04 NOTE — ED Provider Notes (Signed)
[facility] Emergency Department Provider Note  Time seen: 10:02 PM  I have reviewed the triage vital signs and the nursing notes.   HISTORY  Chief Complaint Fall   HPI Victoria Gutierrez is a 73 y.o. female with a past medical history of hypertension, CVA, hyperlipidemia, presents to the emergency department after a fall with left hip pain.  According to the patient she was walking into her house, states she tripped on the front step causing her to fall forward landing on her left hip.  Patient states pain to the left hip worse with range of motion.  Denies any head injury denies LOC.  Overall the patient appears well denies any recent fever cough or shortness of breath or other recent illnesses.   States mild to moderate pain 4/10 in severity aching type pain in the left leg.  Past Medical History:  Diagnosis Date  . Hypertension   . Stroke [facility])     Patient Active Problem List   Diagnosis Date Noted  . Carotid stenosis 02/20/2019  . Ischemic stroke ([place]) 01/20/2019  . Localized osteoarthritis of hands, bilateral 09/23/2016  . Multiple joint pain 08/25/2016  . Psoriasis, unspecified 08/25/2016  . Cerebral vascular disease 04/16/2014  . Hyperlipidemia 04/16/2014  . Hypertension 04/16/2014  . 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 ABDOMINAL HYSTERECTOMY      Prior to Admission medications   Medication Sig Start Date End Date Taking? Authorizing Provider  aspirin EC 81 MG EC tablet Take 1 tablet (81 mg total) by mouth daily. 01/23/19   Mariel Aloe, MD  atorvastatin (LIPITOR) 40 MG tablet Take 1 tablet (40 mg total) by mouth daily at 6 PM. 01/22/19   Mariel Aloe, MD  citalopram (CELEXA) 20 MG tablet Take 20 mg by mouth daily.    [provider]  clopidogrel (PLAVIX) 75 MG tablet Take 1 tablet (75 mg total) by mouth daily. Take for 90 days 01/23/19 04/23/19  Mariel Aloe, MD   hydrochlorothiazide (MICROZIDE) 12.5 MG capsule Take 12.5 mg by mouth daily.    [provider]  lisinopril (ZESTRIL) 10 MG tablet Take 10 mg by mouth daily.    [provider]  Melatonin 5 MG TBDP Take 5 mg by mouth at bedtime.  05/12/16   [provider]  methotrexate (RHEUMATREX) 2.5 MG tablet Take 12.5 mg by mouth every Sunday.  02/22/18   [provider]    No Known Allergies  Family History  Problem Relation Age of Onset  . Heart disease Mother   . Heart disease Father   . Breast cancer Neg Hx     Social History Social History   Tobacco Use  . Smoking status: Current Every Day Smoker    Packs/day: 0.25    Years: 30.00    Pack years: 7.50    Types: Cigarettes    Start date: 03/10/1987  . Smokeless tobacco: Never Used  Substance Use Topics  . Alcohol use: No  . Drug use: No    Review of Systems Constitutional: Negative for fever Cardiovascular: Negative for chest pain. Respiratory: Negative for shortness of breath. Gastrointestinal: Negative for abdominal pain, vomiting and diarrhea. Musculoskeletal: Left hip pain. Neurological: Negative for headache All other ROS negative  ____________________________________________   PHYSICAL EXAM:  VITAL SIGNS: ED Triage Vitals [04/04/19 2151]  Enc Vitals Group     BP      Pulse  Resp      Temp 99 F (37.2 C)     Temp Source Oral     SpO2      Weight 169 lb (76.7 kg)     Height 5\' 6"  (1.676 m)     Head Circumference      Peak Flow      Pain Score 6     Pain Loc      Pain Edu?      Excl. in [place]?     Constitutional: Alert and oriented. Well appearing and in no distress. Eyes: Normal exam ENT      Head: Normocephalic and atraumatic.      Mouth/Throat: Mucous membranes are moist. Cardiovascular: Normal rate, regular rhythm.  Respiratory: Normal respiratory effort without tachypnea nor retractions. Breath sounds are clear Gastrointestinal: Soft and nontender. No  distention.  Musculoskeletal: Soft, no tenderness to left hip palpation mild pain with range of motion of the left hip.  Neurovascular intact distally. Neurologic:  Normal speech and language. No gross focal neurologic deficits  Skin:  Skin is warm, dry and intact.  Psychiatric: Mood and affect are normal.   ____________________________________________    EKG  EKG viewed and interpreted by myself shows a normal sinus rhythm at 75 bpm with a slightly widened QRS, normal axis, slight QTC prolongation otherwise normal intervals with nonspecific ST changes.  ____________________________________________    RADIOLOGY  Left femoral neck fracture.  ____________________________________________   INITIAL IMPRESSION / ASSESSMENT AND PLAN / ED COURSE  Pertinent labs & imaging results that were available during my care of the patient were reviewed by me and considered in my medical decision making (see chart for details).   Patient presents to the emergency department after a fall with left hip pain.  Differential would include hip fracture, contusion, pelvis fracture.  We will check basic labs, obtain x-ray images and continue to closely monitor.  Patient agreeable to plan of care.  X-ray consistent with left femoral neck fracture.  Patient will be admitted to the hospital service.  I discussed the patient with Dr. Rudene Christians of orthopedic surgery.  Victoria Gutierrez was evaluated in Emergency Department on 04/04/2019 for the symptoms described in the history of present illness. She was evaluated in the context of the global COVID-19 pandemic, which necessitated consideration that the patient might be at risk for infection with the SARS-CoV-2 virus that causes COVID-19. Institutional protocols and algorithms that pertain to the evaluation of patients at risk for COVID-19 are in a state of rapid change based on information released by regulatory bodies including the CDC and federal and state organizations.  These policies and algorithms were followed during the patient's care in the ED.  ____________________________________________   FINAL CLINICAL IMPRESSION(S) / ED DIAGNOSES  Left hip fracture Cheral Marker, MD 04/04/19 2240

## 2019-04-05 ENCOUNTER — Inpatient Hospital Stay: Payer: Medicare HMO

## 2019-04-05 ENCOUNTER — Inpatient Hospital Stay: Payer: Medicare HMO | Admitting: Anesthesiology

## 2019-04-05 ENCOUNTER — Other Ambulatory Visit: Payer: Self-pay

## 2019-04-05 ENCOUNTER — Encounter
Admission: EM | Disposition: A | Discharge status: Home Health | Payer: Self-pay | Source: Home / Self Care | Attending: Internal Medicine

## 2019-04-05 ENCOUNTER — Encounter: Payer: Self-pay | Admitting: Anesthesiology

## 2019-04-05 DIAGNOSIS — Z96642 Presence of left artificial hip joint: Secondary | ICD-10-CM | POA: Diagnosis not present

## 2019-04-05 DIAGNOSIS — Z471 Aftercare following joint replacement surgery: Secondary | ICD-10-CM | POA: Diagnosis not present

## 2019-04-05 DIAGNOSIS — S72002A Fracture of unspecified part of neck of left femur, initial encounter for closed fracture: Secondary | ICD-10-CM | POA: Diagnosis present

## 2019-04-05 DIAGNOSIS — I10 Essential (primary) hypertension: Secondary | ICD-10-CM | POA: Diagnosis present

## 2019-04-05 DIAGNOSIS — W010XXA Fall on same level from slipping, tripping and stumbling without subsequent striking against object, initial encounter: Secondary | ICD-10-CM | POA: Diagnosis present

## 2019-04-05 DIAGNOSIS — S72002D Fracture of unspecified part of neck of left femur, subsequent encounter for closed fracture with routine healing: Secondary | ICD-10-CM | POA: Diagnosis not present

## 2019-04-05 DIAGNOSIS — D72829 Elevated white blood cell count, unspecified: Secondary | ICD-10-CM | POA: Diagnosis not present

## 2019-04-05 DIAGNOSIS — Z20828 Contact with and (suspected) exposure to other viral communicable diseases: Secondary | ICD-10-CM | POA: Diagnosis present

## 2019-04-05 DIAGNOSIS — N179 Acute kidney failure, unspecified: Secondary | ICD-10-CM | POA: Diagnosis present

## 2019-04-05 DIAGNOSIS — Z79899 Other long term (current) drug therapy: Secondary | ICD-10-CM | POA: Diagnosis not present

## 2019-04-05 DIAGNOSIS — Z7902 Long term (current) use of antithrombotics/antiplatelets: Secondary | ICD-10-CM | POA: Diagnosis not present

## 2019-04-05 DIAGNOSIS — F17210 Nicotine dependence, cigarettes, uncomplicated: Secondary | ICD-10-CM | POA: Diagnosis present

## 2019-04-05 DIAGNOSIS — M1612 Unilateral primary osteoarthritis, left hip: Secondary | ICD-10-CM | POA: Diagnosis present

## 2019-04-05 DIAGNOSIS — E785 Hyperlipidemia, unspecified: Secondary | ICD-10-CM | POA: Diagnosis present

## 2019-04-05 DIAGNOSIS — Z419 Encounter for procedure for purposes other than remedying health state, unspecified: Secondary | ICD-10-CM | POA: Diagnosis present

## 2019-04-05 DIAGNOSIS — Z8249 Family history of ischemic heart disease and other diseases of the circulatory system: Secondary | ICD-10-CM | POA: Diagnosis not present

## 2019-04-05 DIAGNOSIS — Z8673 Personal history of transient ischemic attack (TIA), and cerebral infarction without residual deficits: Secondary | ICD-10-CM | POA: Diagnosis not present

## 2019-04-05 DIAGNOSIS — Z7982 Long term (current) use of aspirin: Secondary | ICD-10-CM | POA: Diagnosis not present

## 2019-04-05 DIAGNOSIS — Y92009 Unspecified place in unspecified non-institutional (private) residence as the place of occurrence of the external cause: Secondary | ICD-10-CM | POA: Diagnosis not present

## 2019-04-05 HISTORY — PX: TOTAL HIP ARTHROPLASTY: SHX124

## 2019-04-05 LAB — URINALYSIS, COMPLETE (UACMP) WITH MICROSCOPIC
Bacteria, UA: NONE SEEN
Bilirubin Urine: NEGATIVE
Glucose, UA: NEGATIVE mg/dL
Ketones, ur: NEGATIVE mg/dL
Leukocytes,Ua: NEGATIVE
Nitrite: NEGATIVE
Protein, ur: NEGATIVE mg/dL
Specific Gravity, Urine: 1.012 (ref 1.005–1.030)
WBC, UA: NONE SEEN WBC/hpf (ref 0–5)
pH: 6 (ref 5.0–8.0)

## 2019-04-05 LAB — SARS CORONAVIRUS 2 BY RT PCR (HOSPITAL ORDER, PERFORMED IN ~~LOC~~ HOSPITAL LAB): SARS Coronavirus 2: NEGATIVE

## 2019-04-05 LAB — SURGICAL PCR SCREEN
MRSA, PCR: NEGATIVE
Staphylococcus aureus: NEGATIVE

## 2019-04-05 SURGERY — ARTHROPLASTY, HIP, TOTAL, ANTERIOR APPROACH
Anesthesia: General | Site: Hip | Laterality: Left

## 2019-04-05 MED ORDER — GLYCOPYRROLATE 0.2 MG/ML IJ SOLN
INTRAMUSCULAR | Status: DC | PRN
  Administered 2019-04-05: 0.2 mg via INTRAVENOUS

## 2019-04-05 MED ORDER — ASPIRIN EC 81 MG PO TBEC: 81.0000 mg | DELAYED_RELEASE_TABLET | Freq: Every day | ORAL | Status: DC

## 2019-04-05 MED ORDER — LIDOCAINE HCL (PF) 2 % IJ SOLN
INTRAMUSCULAR | Status: DC | PRN
  Administered 2019-04-05: 50 mg

## 2019-04-05 MED ORDER — GENTAMICIN SULFATE 40 MG/ML IJ SOLN
INTRAMUSCULAR | Status: AC
  Filled 2019-04-05: qty 4

## 2019-04-05 MED ORDER — PHENYLEPHRINE HCL (PRESSORS) 10 MG/ML IV SOLN
INTRAVENOUS | Status: DC | PRN
  Administered 2019-04-05 (×4): 200 ug via INTRAVENOUS
  Administered 2019-04-05 (×2): 100 ug via INTRAVENOUS

## 2019-04-05 MED ORDER — CEFAZOLIN SODIUM-DEXTROSE 2-4 GM/100ML-% IV SOLN
2.0000 g | Freq: Four times a day (QID) | INTRAVENOUS | Status: AC
  Administered 2019-04-05 – 2019-04-06 (×2): 2 g via INTRAVENOUS
  Filled 2019-04-05 (×2): qty 100

## 2019-04-05 MED ORDER — FENTANYL CITRATE (PF) 100 MCG/2ML IJ SOLN: 25.0000 ug | INTRAMUSCULAR | Status: DC | PRN

## 2019-04-05 MED ORDER — ONDANSETRON HCL 4 MG/2ML IJ SOLN
INTRAMUSCULAR | Status: DC | PRN
  Administered 2019-04-05: 4 mg via INTRAVENOUS

## 2019-04-05 MED ORDER — MENTHOL 3 MG MT LOZG
1.0000 | LOZENGE | OROMUCOSAL | Status: DC | PRN
  Filled 2019-04-05: qty 9

## 2019-04-05 MED ORDER — CLOPIDOGREL BISULFATE 75 MG PO TABS
75.0000 mg | ORAL_TABLET | Freq: Every day | ORAL | Status: DC
  Administered 2019-04-06 – 2019-04-09 (×4): 75 mg via ORAL
  Filled 2019-04-05 (×4): qty 1

## 2019-04-05 MED ORDER — DEXAMETHASONE SODIUM PHOSPHATE 10 MG/ML IJ SOLN
INTRAMUSCULAR | Status: DC | PRN
  Administered 2019-04-05: 10 mg via INTRAVENOUS

## 2019-04-05 MED ORDER — ESCITALOPRAM OXALATE 10 MG PO TABS
10.0000 mg | ORAL_TABLET | Freq: Every day | ORAL | Status: DC
  Filled 2019-04-05: qty 1

## 2019-04-05 MED ORDER — LABETALOL HCL 5 MG/ML IV SOLN
10.0000 mg | INTRAVENOUS | Status: DC | PRN
  Filled 2019-04-05: qty 4

## 2019-04-05 MED ORDER — HYDROCODONE-ACETAMINOPHEN 5-325 MG PO TABS
1.0000 | ORAL_TABLET | ORAL | Status: DC | PRN
  Administered 2019-04-07: 2 via ORAL
  Administered 2019-04-08: 1 via ORAL
  Filled 2019-04-05: qty 2
  Filled 2019-04-05: qty 1

## 2019-04-05 MED ORDER — CLOPIDOGREL BISULFATE 75 MG PO TABS: 75.0000 mg | ORAL_TABLET | Freq: Every day | ORAL | Status: DC

## 2019-04-05 MED ORDER — ALUM & MAG HYDROXIDE-SIMETH 200-200-20 MG/5ML PO SUSP: 30.0000 mL | ORAL | Status: DC | PRN

## 2019-04-05 MED ORDER — ZOLPIDEM TARTRATE 5 MG PO TABS: 5.0000 mg | ORAL_TABLET | Freq: Every evening | ORAL | Status: DC | PRN

## 2019-04-05 MED ORDER — MELATONIN 5 MG PO TABS
5.0000 mg | ORAL_TABLET | Freq: Every day | ORAL | Status: DC
  Administered 2019-04-05 – 2019-04-08 (×4): 5 mg via ORAL
  Filled 2019-04-05 (×5): qty 1

## 2019-04-05 MED ORDER — PANTOPRAZOLE SODIUM 40 MG PO TBEC
40.0000 mg | DELAYED_RELEASE_TABLET | Freq: Every day | ORAL | Status: DC
  Administered 2019-04-05 – 2019-04-09 (×5): 40 mg via ORAL
  Filled 2019-04-05 (×5): qty 1

## 2019-04-05 MED ORDER — EPHEDRINE SULFATE 50 MG/ML IJ SOLN
INTRAMUSCULAR | Status: AC
  Filled 2019-04-05: qty 1

## 2019-04-05 MED ORDER — EPHEDRINE SULFATE 50 MG/ML IJ SOLN
INTRAMUSCULAR | Status: DC | PRN
  Administered 2019-04-05: 10 mg via INTRAVENOUS

## 2019-04-05 MED ORDER — ACETAMINOPHEN 325 MG PO TABS: 325.0000 mg | ORAL_TABLET | Freq: Four times a day (QID) | ORAL | Status: DC | PRN

## 2019-04-05 MED ORDER — TRAMADOL HCL 50 MG PO TABS
50.0000 mg | ORAL_TABLET | Freq: Four times a day (QID) | ORAL | Status: DC
  Administered 2019-04-05 – 2019-04-09 (×12): 50 mg via ORAL
  Filled 2019-04-05 (×13): qty 1

## 2019-04-05 MED ORDER — CITALOPRAM HYDROBROMIDE 20 MG PO TABS
20.0000 mg | ORAL_TABLET | Freq: Every day | ORAL | Status: DC
  Administered 2019-04-06 – 2019-04-09 (×4): 20 mg via ORAL
  Filled 2019-04-05 (×4): qty 1

## 2019-04-05 MED ORDER — SODIUM CHLORIDE 0.9 % IV SOLN
INTRAVENOUS | Status: DC | PRN
  Administered 2019-04-05: 60 mL

## 2019-04-05 MED ORDER — MAGNESIUM CITRATE PO SOLN
1.0000 | Freq: Once | ORAL | Status: DC | PRN
  Filled 2019-04-05: qty 296

## 2019-04-05 MED ORDER — BUPIVACAINE-EPINEPHRINE 0.25% -1:200000 IJ SOLN
INTRAMUSCULAR | Status: DC | PRN
  Administered 2019-04-05: 30 mL

## 2019-04-05 MED ORDER — METOCLOPRAMIDE HCL 5 MG/ML IJ SOLN: 5.0000 mg | Freq: Three times a day (TID) | INTRAMUSCULAR | Status: DC | PRN

## 2019-04-05 MED ORDER — METHOTREXATE 2.5 MG PO TABS
12.5000 mg | ORAL_TABLET | ORAL | Status: DC
  Administered 2019-04-08: 10:00:00 12.5 mg via ORAL
  Filled 2019-04-05: qty 5

## 2019-04-05 MED ORDER — FENTANYL CITRATE (PF) 100 MCG/2ML IJ SOLN
INTRAMUSCULAR | Status: DC | PRN
  Administered 2019-04-05 (×2): 50 ug via INTRAVENOUS

## 2019-04-05 MED ORDER — ONDANSETRON HCL 4 MG/2ML IJ SOLN
INTRAMUSCULAR | Status: AC
  Filled 2019-04-05: qty 2

## 2019-04-05 MED ORDER — FENTANYL CITRATE (PF) 100 MCG/2ML IJ SOLN
INTRAMUSCULAR | Status: AC
  Filled 2019-04-05: qty 2

## 2019-04-05 MED ORDER — DEXAMETHASONE SODIUM PHOSPHATE 10 MG/ML IJ SOLN
INTRAMUSCULAR | Status: AC
  Filled 2019-04-05: qty 1

## 2019-04-05 MED ORDER — DEXMEDETOMIDINE HCL IN NACL 200 MCG/50ML IV SOLN
INTRAVENOUS | Status: DC | PRN
  Administered 2019-04-05: 8 ug via INTRAVENOUS

## 2019-04-05 MED ORDER — LIDOCAINE HCL (PF) 2 % IJ SOLN
INTRAMUSCULAR | Status: AC
  Filled 2019-04-05: qty 10

## 2019-04-05 MED ORDER — HEPARIN SODIUM (PORCINE) 10000 UNIT/ML IJ SOLN
INTRAMUSCULAR | Status: AC
  Filled 2019-04-05: qty 3

## 2019-04-05 MED ORDER — SODIUM CHLORIDE 0.9 % IV SOLN
INTRAVENOUS | Status: DC | PRN
  Administered 2019-04-05: 50 ug/min via INTRAVENOUS

## 2019-04-05 MED ORDER — BISACODYL 10 MG RE SUPP
10.0000 mg | Freq: Every day | RECTAL | Status: DC | PRN
  Administered 2019-04-07: 17:00:00 10 mg via RECTAL
  Filled 2019-04-05: qty 1

## 2019-04-05 MED ORDER — HYDROCODONE-ACETAMINOPHEN 5-325 MG PO TABS
1.0000 | ORAL_TABLET | Freq: Four times a day (QID) | ORAL | Status: DC | PRN
  Administered 2019-04-05: 01:00:00 2 via ORAL
  Filled 2019-04-05: qty 2

## 2019-04-05 MED ORDER — MIDAZOLAM HCL 2 MG/2ML IJ SOLN
INTRAMUSCULAR | Status: AC
  Filled 2019-04-05: qty 2

## 2019-04-05 MED ORDER — CEFAZOLIN SODIUM-DEXTROSE 1-4 GM/50ML-% IV SOLN
1.0000 g | Freq: Once | INTRAVENOUS | Status: AC
  Administered 2019-04-05: 1 g via INTRAVENOUS
  Filled 2019-04-05: qty 50

## 2019-04-05 MED ORDER — HYDROCODONE-ACETAMINOPHEN 7.5-325 MG PO TABS
1.0000 | ORAL_TABLET | ORAL | Status: DC | PRN
  Administered 2019-04-07: 1 via ORAL
  Filled 2019-04-05: qty 1

## 2019-04-05 MED ORDER — PROPOFOL 10 MG/ML IV BOLUS
INTRAVENOUS | Status: DC | PRN
  Administered 2019-04-05: 100 mg via INTRAVENOUS

## 2019-04-05 MED ORDER — HYDROCHLOROTHIAZIDE 12.5 MG PO CAPS
12.5000 mg | ORAL_CAPSULE | Freq: Every day | ORAL | Status: DC
  Administered 2019-04-06 – 2019-04-08 (×3): 12.5 mg via ORAL
  Filled 2019-04-05 (×4): qty 1

## 2019-04-05 MED ORDER — DIPHENHYDRAMINE HCL 12.5 MG/5ML PO ELIX: 12.5000 mg | ORAL_SOLUTION | ORAL | Status: DC | PRN

## 2019-04-05 MED ORDER — DOCUSATE SODIUM 100 MG PO CAPS
100.0000 mg | ORAL_CAPSULE | Freq: Two times a day (BID) | ORAL | Status: DC
  Administered 2019-04-05 – 2019-04-09 (×8): 100 mg via ORAL
  Filled 2019-04-05 (×8): qty 1

## 2019-04-05 MED ORDER — METHOCARBAMOL 1000 MG/10ML IJ SOLN
500.0000 mg | Freq: Four times a day (QID) | INTRAVENOUS | Status: DC | PRN
  Filled 2019-04-05: qty 5

## 2019-04-05 MED ORDER — MORPHINE SULFATE (PF) 2 MG/ML IV SOLN: 0.5000 mg | INTRAVENOUS | Status: DC | PRN

## 2019-04-05 MED ORDER — MORPHINE SULFATE (PF) 2 MG/ML IV SOLN
2.0000 mg | INTRAVENOUS | Status: DC | PRN
  Administered 2019-04-05: 10:00:00 2 mg via INTRAVENOUS
  Filled 2019-04-05: qty 1

## 2019-04-05 MED ORDER — PROPOFOL 10 MG/ML IV BOLUS
INTRAVENOUS | Status: AC
  Filled 2019-04-05: qty 20

## 2019-04-05 MED ORDER — ASPIRIN 81 MG PO CHEW
81.0000 mg | CHEWABLE_TABLET | Freq: Two times a day (BID) | ORAL | Status: DC
  Administered 2019-04-05 – 2019-04-09 (×8): 81 mg via ORAL
  Filled 2019-04-05 (×8): qty 1

## 2019-04-05 MED ORDER — METOCLOPRAMIDE HCL 10 MG PO TABS: 5.0000 mg | ORAL_TABLET | Freq: Three times a day (TID) | ORAL | Status: DC | PRN

## 2019-04-05 MED ORDER — ONDANSETRON HCL 4 MG/2ML IJ SOLN: 4.0000 mg | Freq: Once | INTRAMUSCULAR | Status: DC | PRN

## 2019-04-05 MED ORDER — SODIUM CHLORIDE 0.9 % IV SOLN
INTRAVENOUS | Status: AC
  Administered 2019-04-05: 10:00:00 via INTRAVENOUS

## 2019-04-05 MED ORDER — SODIUM CHLORIDE 0.9 % IV SOLN
INTRAVENOUS | Status: DC
  Administered 2019-04-05: 21:00:00 via INTRAVENOUS

## 2019-04-05 MED ORDER — SODIUM CHLORIDE 0.9 % IV SOLN
INTRAVENOUS | Status: DC | PRN
  Administered 2019-04-05: 500 mL

## 2019-04-05 MED ORDER — ONDANSETRON HCL 4 MG PO TABS: 4.0000 mg | ORAL_TABLET | Freq: Four times a day (QID) | ORAL | Status: DC | PRN

## 2019-04-05 MED ORDER — FENTANYL CITRATE (PF) 100 MCG/2ML IJ SOLN
25.0000 ug | INTRAMUSCULAR | Status: DC | PRN
  Administered 2019-04-05 (×2): 25 ug via INTRAVENOUS

## 2019-04-05 MED ORDER — MIDAZOLAM HCL 5 MG/5ML IJ SOLN
INTRAMUSCULAR | Status: DC | PRN
  Administered 2019-04-05: 2 mg via INTRAVENOUS

## 2019-04-05 MED ORDER — CITALOPRAM HYDROBROMIDE 20 MG PO TABS: 20.0000 mg | ORAL_TABLET | Freq: Every day | ORAL | Status: DC

## 2019-04-05 MED ORDER — ATORVASTATIN CALCIUM 20 MG PO TABS
40.0000 mg | ORAL_TABLET | Freq: Every day | ORAL | Status: DC
  Administered 2019-04-05 – 2019-04-08 (×4): 40 mg via ORAL
  Filled 2019-04-05 (×4): qty 2

## 2019-04-05 MED ORDER — MAGNESIUM HYDROXIDE 400 MG/5ML PO SUSP
30.0000 mL | Freq: Every day | ORAL | Status: DC | PRN
  Administered 2019-04-06 – 2019-04-07 (×2): 30 mL via ORAL
  Filled 2019-04-05 (×2): qty 30

## 2019-04-05 MED ORDER — PHENOL 1.4 % MT LIQD
1.0000 | OROMUCOSAL | Status: DC | PRN
  Filled 2019-04-05: qty 177

## 2019-04-05 MED ORDER — ONDANSETRON HCL 4 MG/2ML IJ SOLN: 4.0000 mg | Freq: Four times a day (QID) | INTRAMUSCULAR | Status: DC | PRN

## 2019-04-05 MED ORDER — GLYCOPYRROLATE 0.2 MG/ML IJ SOLN
INTRAMUSCULAR | Status: AC
  Filled 2019-04-05: qty 1

## 2019-04-05 MED ORDER — METHOCARBAMOL 500 MG PO TABS: 500.0000 mg | ORAL_TABLET | Freq: Four times a day (QID) | ORAL | Status: DC | PRN

## 2019-04-05 SURGICAL SUPPLY — 64 items
BLADE SAGITTAL AGGR TOOTH XLG (BLADE) ×3 IMPLANT
BNDG COHESIVE 6X5 TAN STRL LF (GAUZE/BANDAGES/DRESSINGS) ×9 IMPLANT
CANISTER SUCT 1200ML W/VALVE (MISCELLANEOUS) ×3 IMPLANT
CANISTER WOUND CARE 500ML ATS (WOUND CARE) ×3 IMPLANT
CELL SAVER COLL SVCS (MISCELLANEOUS) ×3
CELL SAVER FILTER LIPID PALL S (MISCELLANEOUS) ×3
CHLORAPREP W/TINT 26 (MISCELLANEOUS) ×3 IMPLANT
COVER BACK TABLE REUSABLE LG (DRAPES) ×3 IMPLANT
COVER WAND RF STERILE (DRAPES) ×3 IMPLANT
DRAPE 3/4 80X56 (DRAPES) ×9 IMPLANT
DRAPE C-ARM XRAY 36X54 (DRAPES) ×3 IMPLANT
DRAPE INCISE IOBAN 66X60 STRL (DRAPES) IMPLANT
DRAPE POUCH INSTRU U-SHP 10X18 (DRAPES) ×3 IMPLANT
DRESSING SURGICEL FIBRLLR 1X2 (HEMOSTASIS) ×2 IMPLANT
DRSG OPSITE POSTOP 4X8 (GAUZE/BANDAGES/DRESSINGS) ×6 IMPLANT
DRSG SURGICEL FIBRILLAR 1X2 (HEMOSTASIS) ×6
ELECT BLADE 6.5 EXT (BLADE) ×3 IMPLANT
ELECT REM PT RETURN 9FT ADLT (ELECTROSURGICAL) ×3
ELECTRODE REM PT RTRN 9FT ADLT (ELECTROSURGICAL) ×1 IMPLANT
FILTER LIPID PALL S CELL SAVER (MISCELLANEOUS) IMPLANT
GLOVE BIOGEL PI IND STRL 9 (GLOVE) ×1 IMPLANT
GLOVE BIOGEL PI INDICATOR 9 (GLOVE) ×2
GLOVE SURG SYN 9.0  PF PI (GLOVE) ×4
GLOVE SURG SYN 9.0 PF PI (GLOVE) ×2 IMPLANT
GOWN SRG 2XL LVL 4 RGLN SLV (GOWNS) ×1 IMPLANT
GOWN STRL NON-REIN 2XL LVL4 (GOWNS) ×2
GOWN STRL REUS W/ TWL LRG LVL3 (GOWN DISPOSABLE) ×1 IMPLANT
GOWN STRL REUS W/TWL LRG LVL3 (GOWN DISPOSABLE) ×2
HEMOVAC 400CC 10FR (MISCELLANEOUS) IMPLANT
HIP FEM HD S 28 (Head) ×2 IMPLANT
HOLDER FOLEY CATH W/STRAP (MISCELLANEOUS) ×3 IMPLANT
HOOD PEEL AWAY FLYTE STAYCOOL (MISCELLANEOUS) ×3 IMPLANT
KIT PREVENA INCISION MGT 13 (CANNISTER) ×3 IMPLANT
LINER DM 28MM (Liner) ×2 IMPLANT
MAT ABSORB  FLUID 56X50 GRAY (MISCELLANEOUS) ×2
MAT ABSORB FLUID 56X50 GRAY (MISCELLANEOUS) ×1 IMPLANT
NDL SAFETY ECLIPSE 18X1.5 (NEEDLE) ×1 IMPLANT
NDL SPNL 20GX3.5 QUINCKE YW (NEEDLE) ×2 IMPLANT
NEEDLE HYPO 18GX1.5 SHARP (NEEDLE) ×2
NEEDLE SPNL 20GX3.5 QUINCKE YW (NEEDLE) ×6 IMPLANT
NS IRRIG 1000ML POUR BTL (IV SOLUTION) ×3 IMPLANT
PACK HIP COMPR (MISCELLANEOUS) ×3 IMPLANT
SAVER CELL COLL SVCS (MISCELLANEOUS) IMPLANT
SCALPEL PROTECTED #10 DISP (BLADE) ×6 IMPLANT
SHELL ACETABULAR DM  48MM (Shell) ×2 IMPLANT
SOL PREP PVP 2OZ (MISCELLANEOUS) ×3
SOLUTION PREP PVP 2OZ (MISCELLANEOUS) ×1 IMPLANT
SPONGE DRAIN TRACH 4X4 STRL 2S (GAUZE/BANDAGES/DRESSINGS) ×3 IMPLANT
STAPLER SKIN PROX 35W (STAPLE) ×3 IMPLANT
STEM FEMORAL SZ3  STD COLLARED (Stem) ×2 IMPLANT
STRAP SAFETY 5IN WIDE (MISCELLANEOUS) ×3 IMPLANT
SUT DVC 2 QUILL PDO  T11 36X36 (SUTURE) ×2
SUT DVC 2 QUILL PDO T11 36X36 (SUTURE) ×1 IMPLANT
SUT SILK 0 (SUTURE) ×2
SUT SILK 0 30XBRD TIE 6 (SUTURE) ×1 IMPLANT
SUT V-LOC 90 ABS DVC 3-0 CL (SUTURE) ×3 IMPLANT
SUT VIC AB 1 CT1 36 (SUTURE) ×3 IMPLANT
SYR 20ML LL LF (SYRINGE) ×3 IMPLANT
SYR 30ML LL (SYRINGE) ×3 IMPLANT
SYR 50ML LL SCALE MARK (SYRINGE) ×6 IMPLANT
SYR BULB IRRIG 60ML STRL (SYRINGE) ×3 IMPLANT
TAPE MICROFOAM 4IN (TAPE) ×3 IMPLANT
TOWEL OR 17X26 4PK STRL BLUE (TOWEL DISPOSABLE) ×3 IMPLANT
TRAY FOLEY MTR SLVR 16FR STAT (SET/KITS/TRAYS/PACK) ×3 IMPLANT

## 2019-04-05 NOTE — Progress Notes (Signed)
Patient dangled to the side of the bed. Tolerated well.  

## 2019-04-05 NOTE — TOC Initial Note (Signed)
Transition of Care [facility]) - Initial/Assessment Note    Patient Details  Name: Victoria Gutierrez MRN: [id number] Date of Birth: 08-25-1945  [facility] of [facility]) CM/SW Contact:    Rosmary Dionisio, Lenice Llamas Phone Number: [phone]  04/05/2019, 12:07 PM  Clinical Narrative: Clinical Social Worker (CSW) met with patient to discuss D/C plan. Patient was alert and oriented X4 and was laying in the bed. Patient will have surgery today. CSW introduced self and explained role of CSW department. Patient reported that she lives in [place] with her daughter Nira Conn. Patient reported that she has a walker and bedside commode at home. Patient reported that [facility] home health is coming to her house. Per [name] home health representative patient is open to [facility] for PT. CSW explained that patient will work with PT after surgery. CSW explained that PT will make a recommendation of home health or SNF. Patient prefers to go home and continue [facility] home health. Patient reported that her daughter can provide 24/7 supervision for her. CSW will continue to follow and assist as needed.                  Expected Discharge Plan: [place] Barriers to Discharge: Continued Medical Work up   Patient Goals and CMS Choice Patient states their goals for this hospitalization and ongoing recovery are:: To go home.      Expected Discharge Plan and Services Expected Discharge Plan: [place] In-house Referral: Clinical Social Work     Living arrangements for the past 2 months: Single Family Home                 DME Arranged: N/A         HH Arranged: PT HH Agency: [facility] Date [place]: 04/05/19 Time [place]: 1206 Representative spoke with at [place]: [place] Arrangements/Services Living arrangements for the past 2 months: [place] with:: Adult Children Patient language and need for  interpreter reviewed:: No Do you feel safe going back to the place where you live?: Yes      Need for Family Participation in Patient Care: Yes (Comment) Care giver support system in place?: Yes (comment) Current home services: DME, Home RT(Per patient she has a walker and bedside commode at home. Patient is open to [facility] home health for PT.) Criminal Activity/Legal Involvement Pertinent to Current Situation/Hospitalization: No - Comment as needed  Activities of Daily Living Home Assistive Devices/Equipment: Dentures (specify type), Eyeglasses, Cane (specify quad or straight), Walker (specify type) ADL Screening (condition at time of admission) Patient's cognitive ability adequate to safely complete daily activities?: Yes Is the patient deaf or have difficulty hearing?: No Does the patient have difficulty seeing, even when wearing glasses/contacts?: No Does the patient have difficulty concentrating, remembering, or making decisions?: No Patient able to express need for assistance with ADLs?: Yes Does the patient have difficulty dressing or bathing?: No Independently performs ADLs?: Yes (appropriate for developmental age) Does the patient have difficulty walking or climbing stairs?: No Weakness of Legs: None Weakness of Arms/Hands: None  Permission Sought/Granted Permission sought to share information with : Other (comment)(Home Health agency) Permission granted to share information with : Yes, Verbal Permission Granted              Emotional Assessment Appearance:: Appears stated age Attitude/Demeanor/Rapport: Engaged Affect (typically observed): Pleasant, Calm Orientation: : Oriented to Self, Oriented to Place, Oriented to  Time, Oriented to Situation Alcohol / Substance Use: Not Applicable Psych Involvement: No (comment)  Admission diagnosis:  Surgery, elective [Z41.9] Hip pain [M25.559] Closed fracture of left hip, initial encounter [facility]) [S72.002A] Patient Active  Problem List   Diagnosis Date Noted  . Displaced fracture of left femoral neck ([place]) 04/05/2019  . Carotid stenosis 02/20/2019  . Ischemic stroke ([place]) 01/20/2019  . Localized osteoarthritis of hands, bilateral 09/23/2016  . Multiple joint pain 08/25/2016  . Psoriasis, unspecified 08/25/2016  . Cerebral vascular disease 04/16/2014  . Hyperlipidemia 04/16/2014  . Hypertension 04/16/2014  . 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

## 2019-04-05 NOTE — Anesthesia Procedure Notes (Signed)
Procedure Name: LMA Insertion Performed by: Robertha Staples, CRNA Pre-anesthesia Checklist: Patient identified, Patient being monitored, Timeout performed, Emergency Drugs available and Suction available Patient Re-evaluated:Patient Re-evaluated prior to induction Oxygen Delivery Method: Circle system utilized Preoxygenation: Pre-oxygenation with 100% oxygen Induction Type: IV induction Ventilation: Mask ventilation without difficulty LMA: LMA inserted LMA Size: 3.5 Tube type: Oral Number of attempts: 1 Placement Confirmation: positive ETCO2 and breath sounds checked- equal and bilateral Tube secured with: Tape Dental Injury: Teeth and Oropharynx as per pre-operative assessment        

## 2019-04-05 NOTE — TOC Benefit Eligibility Note (Signed)
Transition of Care [facility]) Benefit Eligibility Note    Patient Details  Name: Victoria Gutierrez MRN: [id number] Date of Birth: December 08, 1945   Medication/Dose: Lovenox 40 mg daily x 14 days or generic (Enoxaparin)  Covered?: No        Spoke with Person/Company/Phone Number:: Raffael, [employer], [phone]     Prior Approval: [id number])     Additional Notes: No NDC listed on formulary for Lovenox or Enoxaparin so Prior Auth required    [place] Phone Number: 04/05/2019, 10:45 AM

## 2019-04-05 NOTE — ED Notes (Signed)
Patient resting comfortably at this time, [place] in place without complication

## 2019-04-05 NOTE — Consult Note (Signed)
Reason for Consult: Left femoral neck fracture displaced Referring Physician: ER physician  Victoria Gutierrez is an 73 y.o. female.  HPI: Patient is a 73 year old community ambulator without assistive device to had 2 steps to get into her house and missed a step falling on her left side.  She is unable to bear weight and was brought to the emergency room where she is found to have a displaced femoral neck fracture.  She has a fairly recent history of a CVA and is on Plavix for this.  She denies loss of consciousness.  She normally gets around but since COVID has been predominantly homebound.  Since her CVA she has been getting rehab and slowly regaining strength.  Past Medical History:  Diagnosis Date  . Hypertension   . Stroke [facility])     Past Surgical History:  Procedure Laterality Date  . ABDOMINAL HYSTERECTOMY      Family History  Problem Relation Age of Onset  . Heart disease Mother   . Heart disease Father   . Breast cancer Neg Hx     Social History:  reports that she has been smoking cigarettes. She started smoking about 32 years ago. She has a 7.50 pack-year smoking history. She has never used smokeless tobacco. She reports that she does not drink alcohol or use drugs.  Allergies: No Known Allergies  Medications: I have reviewed the patient's current medications.  Results for orders placed or performed during the hospital encounter of 04/04/19 (from the past 48 hour(s))  CBC     Status: Abnormal   Collection Time: 04/04/19  9:55 PM  Result Value Ref Range   WBC 15.4 (H) 4.0 - 10.5 K/uL   RBC 3.78 (L) 3.87 - 5.11 MIL/uL   Hemoglobin 12.7 12.0 - 15.0 g/dL   HCT 38.4 36.0 - 46.0 %   MCV 101.6 (H) 80.0 - 100.0 fL   MCH 33.6 26.0 - 34.0 pg   MCHC 33.1 30.0 - 36.0 g/dL   RDW 14.5 11.5 - 15.5 %   Platelets 259 150 - 400 K/uL   nRBC 0.0 0.0 - 0.2 %    Comment: Performed at [facility], [address] [place], [place] [id number]  Basic metabolic panel     Status:  Abnormal   Collection Time: 04/04/19  9:55 PM  Result Value Ref Range   Sodium 140 135 - 145 mmol/L   Potassium 4.0 3.5 - 5.1 mmol/L   Chloride 106 98 - 111 mmol/L   CO2 27 22 - 32 mmol/L   Glucose, Bld 106 (H) 70 - 99 mg/dL   BUN 16 8 - 23 mg/dL   Creatinine, Ser 1.16 (H) 0.44 - 1.00 mg/dL   Calcium 8.9 8.9 - 10.3 mg/dL   GFR calc non Af Amer 47 (L) >60 mL/min   GFR calc Af Amer 54 (L) >60 mL/min   Anion gap 7 5 - 15    Comment: Performed at [facility], [address], [place], [place] [address]  Sample to Blood Bank     Status: None (Preliminary result)   Collection Time: 04/04/19  9:55 PM  Result Value Ref Range   Blood Bank Specimen PENDING    Sample Expiration      04/07/2019,2359 Performed at [facility], [address] [place],  [address]   Urinalysis, Complete w Microscopic     Status: Abnormal   Collection Time: 04/04/19  9:55 PM  Result Value Ref Range   Color,  Urine YELLOW (A) YELLOW   APPearance CLEAR (A) CLEAR   Specific Gravity, Urine 1.012 1.005 - 1.030   pH 6.0 5.0 - 8.0   Glucose, UA NEGATIVE NEGATIVE mg/dL   Hgb urine dipstick SMALL (A) NEGATIVE   Bilirubin Urine NEGATIVE NEGATIVE   Ketones, ur NEGATIVE NEGATIVE mg/dL   Protein, ur NEGATIVE NEGATIVE mg/dL   Nitrite NEGATIVE NEGATIVE   Leukocytes,Ua NEGATIVE NEGATIVE   RBC / HPF 0-5 0 - 5 RBC/hpf   WBC, UA NONE SEEN 0 - 5 WBC/hpf   Bacteria, UA NONE SEEN NONE SEEN   Squamous Epithelial / LPF 0-5 0 - 5   Mucus PRESENT     Comment: Performed at [facility], [address], [place], [place] [address]  SARS Coronavirus 2 [facility] order, Performed in [facility] hospital lab) Nasopharyngeal Nasopharyngeal Swab     Status: None   Collection Time: 04/04/19 11:01 PM   Specimen: Nasopharyngeal Swab  Result Value Ref Range   SARS Coronavirus 2 NEGATIVE NEGATIVE    Comment: (NOTE) If result is NEGATIVE SARS-CoV-2 target nucleic acids are NOT DETECTED. The SARS-CoV-2  RNA is generally detectable in upper and lower  respiratory specimens during the acute phase of infection. The lowest  concentration of SARS-CoV-2 viral copies this assay can detect is 250  copies / mL. A negative result does not preclude SARS-CoV-2 infection  and should not be used as the sole basis for treatment or other  patient management decisions.  A negative result may occur with  improper specimen collection / handling, submission of specimen other  than nasopharyngeal swab, presence of viral mutation(s) within the  areas targeted by this assay, and inadequate number of viral copies  (<250 copies / mL). A negative result must be combined with clinical  observations, patient history, and epidemiological information. If result is POSITIVE SARS-CoV-2 target nucleic acids are DETECTED. The SARS-CoV-2 RNA is generally detectable in upper and lower  respiratory specimens dur ing the acute phase of infection.  Positive  results are indicative of active infection with SARS-CoV-2.  Clinical  correlation with patient history and other diagnostic information is  necessary to determine patient infection status.  Positive results do  not rule out bacterial infection or co-infection with other viruses. If result is PRESUMPTIVE POSTIVE SARS-CoV-2 nucleic acids MAY BE PRESENT.   A presumptive positive result was obtained on the submitted specimen  and confirmed on repeat testing.  While 2019 novel coronavirus  (SARS-CoV-2) nucleic acids may be present in the submitted sample  additional confirmatory testing may be necessary for epidemiological  and / or clinical management purposes  to differentiate between  SARS-CoV-2 and other Sarbecovirus currently known to infect humans.  If clinically indicated additional testing with an alternate test  methodology [phone]) is advised. The SARS-CoV-2 RNA is generally  detectable in upper and lower respiratory sp ecimens during the acute  phase of  infection. The expected result is Negative. Fact Sheet for Patients:  StrictlyIdeas.no Fact Sheet for Healthcare Providers: BankingDealers.co.za This test is not yet approved or cleared by the [place] FDA and has been authorized for detection and/or diagnosis of SARS-CoV-2 by FDA under an Emergency Use Authorization (EUA).  This EUA will remain in effect (meaning this test can be used) for the duration of the COVID-19 declaration under Section 564(b)(1) of the Act, 21 U.S.C. section 360bbb-3(b)(1), unless the authorization is terminated or revoked sooner. Performed at [facility], [address], [place], [place] [address]  Dg Chest 1 View  Result Date: 04/04/2019 CLINICAL DATA:  Hip fracture EXAM: CHEST  1 VIEW COMPARISON:  January 20, 2019 FINDINGS: There is mild cardiomegaly. Aortic knob calcifications. Both lungs are clear. The visualized skeletal structures are unremarkable. IMPRESSION: No acute cardiopulmonary process. Electronically Signed   By: Prudencio Pair M.D.   On: 04/04/2019 22:38   Dg Hip Unilat With Pelvis 2-3 Views Left  Result Date: 04/04/2019 CLINICAL DATA:  Fall, pain EXAM: DG HIP (WITH OR WITHOUT PELVIS) 2-3V LEFT COMPARISON:  None. FINDINGS: There is a comminuted mildly displaced left femoral head neck fracture. There is superior displacement of the femoral shaft. The femoral head still articulates with the acetabulum. There is a fracture fragment seen along the medial femoral neck. Dense vascular calcifications are seen. No other fractures identified. IMPRESSION: Comminuted mildly superiorly displaced left femoral head neck junction fracture. Electronically Signed   By: Prudencio Pair M.D.   On: 04/04/2019 22:37    ROS Blood pressure (!) 145/83, pulse 81, temperature 99 F (37.2 C), temperature source Oral, resp. rate 20, height 5\' 6"  (1.676 m), weight 76.7 kg, SpO2 92 %. Physical Exam left leg is held in a  flexed and externally rotated position as that is most comfortable spot for her.  Skin is intact.  She has palpable dorsalis pedis and posterior tib pulses.  She is able to flex extend her toes. Radiographic review shows completely displaced femoral neck fracture with market central hip osteoarthritis.  Assessment/Plan: Displaced femoral neck fracture with underlying significant osteoarthritis Recommendation is for left total hip through an anterior approach.  Discussed risk benefits possible complications and gave her total hip brochure.  Site was marked.  Plan on using Cell Saver with her current use of Plavix to try to diminish the risk of postop transfusion.  Plan on surgery today.  Hessie Knows 04/05/2019, 6:52 AM

## 2019-04-05 NOTE — ED Notes (Signed)
Provided pt more ice chips after the initial cup was spilled. Adjusted pt's leg per her request.

## 2019-04-05 NOTE — Transfer of Care (Signed)
Immediate Anesthesia Transfer of Care Note  Patient: Victoria Gutierrez  Procedure(s) Performed: TOTAL HIP ARTHROPLASTY ANTERIOR APPROACH (Left Hip)  Patient Location: PACU  Anesthesia Type:General  Level of Consciousness: sedated  Airway & Oxygen Therapy: Patient Spontanous Breathing and Patient connected to face mask oxygen  Post-op Assessment: Report given to RN and Post -op Vital signs reviewed and stable  Post vital signs: Reviewed  Last Vitals:  Vitals Value Taken Time  BP 88/60 04/05/19 1443  Temp    Pulse 81 04/05/19 1444  Resp 18 04/05/19 1444  SpO2 97 % 04/05/19 1444  Vitals shown include unvalidated device data.  Last Pain:  Vitals:   04/05/19 1159  TempSrc:   PainSc: 3       Patients Stated Pain Goal: 3 ([id number])  Complications: No apparent anesthesia complications

## 2019-04-05 NOTE — ED Notes (Signed)
Patient resting.

## 2019-04-05 NOTE — ED Notes (Signed)
Dr Menz at bedside. °

## 2019-04-05 NOTE — Anesthesia Preprocedure Evaluation (Signed)
Anesthesia Evaluation  Patient identified by MRN, date of birth, ID band Patient awake    Reviewed: Allergy & Precautions, H&P , NPO status , Patient's Chart, lab work & pertinent test results, reviewed documented beta blocker date and time   History of Anesthesia Complications Negative for: history of anesthetic complications  Airway Mallampati: IV  TM Distance: >3 FB Neck ROM: full    Dental  (+) Dental Advidsory Given, Edentulous Upper, Missing, Caps, Upper Dentures   Pulmonary neg shortness of breath, neg COPD, neg recent URI, Current Smoker,    Pulmonary exam normal        Cardiovascular Exercise Tolerance: Good hypertension, (-) angina(-) Past MI and (-) Cardiac Stents Normal cardiovascular exam(-) dysrhythmias (-) Valvular Problems/Murmurs     Neuro/Psych neg Seizures CVA, No Residual Symptoms negative psych ROS   GI/Hepatic negative GI ROS, Neg liver ROS,   Endo/Other  neg diabetesHyperthyroidism (s/p treatment)   Renal/GU negative Renal ROS  negative genitourinary   Musculoskeletal   Abdominal   Peds  Hematology negative hematology ROS (+)   Anesthesia Other Findings Past Medical History: No date: Hypertension No date: Stroke ([place])   Reproductive/Obstetrics negative OB ROS                             Anesthesia Physical Anesthesia Plan  ASA: II  Anesthesia Plan: General   Post-op Pain Management:    Induction: Intravenous  PONV Risk Score and Plan: 2 and Ondansetron, Dexamethasone and Treatment may vary due to age or medical condition  Airway Management Planned: Oral ETT and LMA  Additional Equipment:   Intra-op Plan:   Post-operative Plan: Extubation in OR  Informed Consent: I have reviewed the patients History and Physical, chart, labs and discussed the procedure including the risks, benefits and alternatives for the proposed anesthesia with the patient or  authorized representative who has indicated his/her understanding and acceptance.     Dental Advisory Given  Plan Discussed with: Anesthesiologist, CRNA and Surgeon  Anesthesia Plan Comments:         Anesthesia Quick Evaluation

## 2019-04-05 NOTE — Progress Notes (Signed)
Cell saver blood complete and pt tolerated well   Vital signs stable

## 2019-04-05 NOTE — TOC Progression Note (Signed)
Transition of Care [facility]) - Progression Note    Patient Details  Name: Victoria Gutierrez MRN: [id number] Date of Birth: 01/07/1946  Transition of Care [facility]) CM/SW [place], RN Phone Number: 04/05/2019, 9:34 AM  Clinical Narrative:    Requested the price of Lovenox        Expected Discharge Plan and Services                                                 Social Determinants of Health (SDOH) Interventions    Readmission Risk Interventions No flowsheet data found.

## 2019-04-05 NOTE — Op Note (Signed)
04/05/2019  2:51 PM  PATIENT:  Victoria Gutierrez  73 y.o. female  PRE-OPERATIVE DIAGNOSIS:  Left Hip Fracture femoral neck, displaced and osteoarthritis left hip  POST-OPERATIVE DIAGNOSIS:  LEFT HIP FRACTURE same  PROCEDURE:  Procedure(s): TOTAL HIP ARTHROPLASTY ANTERIOR APPROACH (Left)  SURGEON: Laurene Footman, MD  ASSISTANTS: None  ANESTHESIA:   general  EBL:  Total I/O In: 750 [I.V.:500; Blood:250] Out: 1300 [Urine:700; Blood:600]  BLOOD ADMINISTERED:250 CC CELLSAVER  DRAINS: none   LOCAL MEDICATIONS USED:  MARCAINE    and OTHER Exparel  SPECIMEN:  Source of Specimen:  Femoral neck and head left hip  DISPOSITION OF SPECIMEN:  PATHOLOGY  COUNTS:  YES  TOURNIQUET:  * No tourniquets in log *  IMPLANTS: Medacta 3 standard stem with 48 mm Mpact TM cup and liner with S metal 28 mm head  DICTATION: .Dragon Dictation   The patient was brought to the operating room and after spinal anesthesia was obtained patient was placed on the operative table with the ipsilateral foot into the Medacta attachment, contralateral leg on a well-padded table. C-arm was brought in and preop template x-ray taken. After prepping and draping in usual sterile fashion appropriate patient identification and timeout procedures were completed. Anterior approach to the hip was obtained and centered over the greater trochanter and TFL muscle. The subcutaneous tissue was incised hemostasis being achieved by electrocautery. TFL fascia was incised and the muscle retracted laterally deep retractor placed. The lateral femoral circumflex vessels were identified and ligated. The anterior capsule was exposed and a capsulotomy performed.  The fracture was subcapital and quite displaced, the neck was identified and a femoral neck cut carried out with a saw. The neck was removed first and then the head was removed without difficulty and showed sclerotic femoral head and acetabulum. Reaming was carried out to 48 mm and a 48 mm  cup trial gave appropriate tightness to the acetabular component a 48 DM cup was impacted into position. The leg was then externally rotated and ischiofemoral and pubofemoral releases carried out. The femur was sequentially broached to a size 3, size 3 standard with S head trials were placed and the final components chosen. The 3 standard stem was inserted along with a metal S 28 mm head and 48 mm liner. The hip was reduced and was stable the wound was thoroughly irrigated with fibrillar placed along the posterior capsule and medial neck. The deep fascia ws closed using a heavy Quill after infiltration of Exparel throughout the case and 30 cc of quarter percent Sensorcaine with epinephrine.3-0 V-loc to close the skin with skin staples.  Incisional wound VAC applied s and tape patient was sent to recovery in stable condition.   PLAN OF CARE: Continue as inpatient

## 2019-04-05 NOTE — Progress Notes (Signed)
[place] [facility] [place] NAME: Victoria Gutierrez    MR#:  [id number]  DATE OF BIRTH:  January 16, 1946  SUBJECTIVE:  CHIEF COMPLAINT: Patient is complaining of left leg pain, awaiting for surgery  REVIEW OF SYSTEMS:  CONSTITUTIONAL: No fever, fatigue or weakness.  EYES: No blurred or double vision.  EARS, NOSE, AND THROAT: No tinnitus or ear pain.  RESPIRATORY: No cough, shortness of breath, wheezing or hemoptysis.  CARDIOVASCULAR: No chest pain, orthopnea, edema.  GASTROINTESTINAL: No nausea, vomiting, diarrhea or abdominal pain.  GENITOURINARY: No dysuria, hematuria.  ENDOCRINE: No polyuria, nocturia,  HEMATOLOGY: No anemia, easy bruising or bleeding SKIN: No rash or lesion. MUSCULOSKELETAL: Left leg pain NEUROLOGIC: No tingling, numbness, weakness.  PSYCHIATRY: No anxiety or depression.   DRUG ALLERGIES:  No Known Allergies  VITALS:  Blood pressure (!) 180/100, pulse 81, temperature 99.2 F (37.3 C), temperature source Temporal, resp. rate 16, height 5\' 6"  (1.676 m), weight 78.7 kg, SpO2 95 %.  PHYSICAL EXAMINATION:  GENERAL:  73 y.o.-year-old patient lying in the bed with no acute distress.  EYES: Pupils equal, round, reactive to light and accommodation. No scleral icterus. Extraocular muscles intact.  HEENT: Head atraumatic, normocephalic. Oropharynx and nasopharynx clear.  NECK:  Supple, no jugular venous distention. No thyroid enlargement, no tenderness.  LUNGS: Normal breath sounds bilaterally, no wheezing, rales,rhonchi or crepitation. No use of accessory muscles of respiration.  CARDIOVASCULAR: S1, S2 normal. No murmurs, rubs, or gallops.  ABDOMEN: Soft, nontender, nondistended. Bowel sounds present.  EXTREMITIES: No pedal edema, cyanosis, or clubbing.  NEUROLOGIC: Cranial nerves II through XII are intact. Muscle strength 5/5 in all extremities other than left leg sensation intact. Gait not checked.  PSYCHIATRIC: The patient is  alert and oriented x 3.  SKIN: No obvious rash, lesion, or ulcer.    LABORATORY PANEL:   CBC Recent Labs  Lab 04/04/19 2155  WBC 15.4*  HGB 12.7  HCT 38.4  PLT 259   ------------------------------------------------------------------------------------------------------------------  Chemistries  Recent Labs  Lab 04/04/19 2155  NA 140  K 4.0  CL 106  CO2 27  GLUCOSE 106*  BUN 16  CREATININE 1.16*  CALCIUM 8.9   ------------------------------------------------------------------------------------------------------------------  Cardiac Enzymes No results for input(s): TROPONINI in the last 168 hours. ------------------------------------------------------------------------------------------------------------------  RADIOLOGY:  Dg Chest 1 View  Result Date: 04/04/2019 CLINICAL DATA:  Hip fracture EXAM: CHEST  1 VIEW COMPARISON:  January 20, 2019 FINDINGS: There is mild cardiomegaly. Aortic knob calcifications. Both lungs are clear. The visualized skeletal structures are unremarkable. IMPRESSION: No acute cardiopulmonary process. Electronically Signed   By: Prudencio Pair M.D.   On: 04/04/2019 22:38   Dg Hip Unilat With Pelvis 2-3 Views Left  Result Date: 04/04/2019 CLINICAL DATA:  Fall, pain EXAM: DG HIP (WITH OR WITHOUT PELVIS) 2-3V LEFT COMPARISON:  None. FINDINGS: There is a comminuted mildly displaced left femoral head neck fracture. There is superior displacement of the femoral shaft. The femoral head still articulates with the acetabulum. There is a fracture fragment seen along the medial femoral neck. Dense vascular calcifications are seen. No other fractures identified. IMPRESSION: Comminuted mildly superiorly displaced left femoral head neck junction fracture. Electronically Signed   By: Prudencio Pair M.D.   On: 04/04/2019 22:37    EKG:   Orders placed or performed during the hospital encounter of 04/04/19  . EKG 12-Lead  . EKG 12-Lead  . ED EKG  . ED EKG     ASSESSMENT AND PLAN:  73 y.o. female with pertinent past medical history of hypertension, CVA and hyperlipidemia presenting to the ED with complaints of left hip pain status post mechanical fall.  1. Left femoral neck fracture -secondary to mechanical fall -Scheduled for surgery today - PRN IV and p.o. pain management - IV fluids hydration -Patient medically optimized for surgery - Orthopedic  Dr. Rudene Christians will her for surgery today  2.  Leukocytosis: WBC 15.4 likely reactive in the setting of stress from mechanical fall - nml UA  - CXR shows no active cardiopulmonary process - Trend WBC's - Check Procalcitonin if patient is febrile  3. Acute on chronic kidney injury - Hold nephrotoxins - IV fluids hydration - Continue to monitor renal function  4. Hypertension - Hold lisinopril in the setting of AKI - Continue low-dose hydrochlorothiazide as creatinine slightly improved from previous - PRN labetalol  5. History of CVA - Hold aspirin and Plavix for the procedure  6. HLD  - Atorvastatin 40mg  PO qhs  7. DVT prophylaxis - Hold anti-coagulation for pending procedure    All the records are reviewed and case discussed with Care Management/Social Workerr. Management plans discussed with the patient, family and they are in agreement.  CODE STATUS: fc  TOTAL TIME TAKING CARE OF THIS PATIENT: 35  minutes.   POSSIBLE D/C IN  2 DAYS, DEPENDING ON CLINICAL CONDITION.  Note: This dictation was prepared with Dragon dictation along with smaller phrase technology. Any transcriptional errors that result from this process are unintentional.   Nicholes Mango M.D on 04/05/2019 at 2:31 PM  Between 7am to 6pm - Pager - [phone] After 6pm go to [email or web address] - password [email or web address] [facility]  [facility]  Office  [phone]  CC: Primary care physician; [place] Pink, MD

## 2019-04-05 NOTE — NC FL2 (Signed)
[place] LEVEL OF CARE SCREENING TOOL     IDENTIFICATION  Patient Name: Victoria Gutierrez Birthdate: 1946-03-22 Sex: female Admission Date (Current Location): 04/04/2019  [place] and [place] Number:  Engineering geologist and Address:  [facility], [address] [place], [place] [address]      Provider Number: [id number]  Attending Physician Name and Address:  Nicholes Mango, MD  Relative Name and Phone Number:       Current Level of Care: Hospital Recommended Level of Care: [place] Prior Approval Number:    Date Approved/Denied:   PASRR Number: [id number]  Discharge Plan: SNF    Current Diagnoses: Patient Active Problem List   Diagnosis Date Noted  . Displaced fracture of left femoral neck ([place]) 04/05/2019  . Carotid stenosis 02/20/2019  . Ischemic stroke ([place]) 01/20/2019  . Localized osteoarthritis of hands, bilateral 09/23/2016  . Multiple joint pain 08/25/2016  . Psoriasis, unspecified 08/25/2016  . Cerebral vascular disease 04/16/2014  . Hyperlipidemia 04/16/2014  . Hypertension 04/16/2014  . 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    Orientation RESPIRATION BLADDER Height & Weight     Self, Time, Situation, Place  Normal Continent Weight: 173 lb 8 oz (78.7 kg) Height:  5\' 6"  (167.6 cm)  BEHAVIORAL SYMPTOMS/MOOD NEUROLOGICAL BOWEL NUTRITION STATUS      Continent Diet(Diet: NPO for surgery to be advanced.)  AMBULATORY STATUS COMMUNICATION OF NEEDS Skin   Extensive Assist Verbally Surgical wounds                       Personal Care Assistance Level of Assistance  Bathing, Feeding, Dressing Bathing Assistance: Limited assistance Feeding assistance: Independent Dressing Assistance: Limited assistance     Functional Limitations Info  Sight, Hearing, Speech Sight Info: Adequate Hearing Info: Adequate Speech Info: Adequate    SPECIAL CARE FACTORS FREQUENCY  PT (By licensed PT), OT (By licensed  OT)     PT Frequency: 5 OT Frequency: 5            Contractures      Additional Factors Info  Code Status, Allergies Code Status Info: Full Code. Allergies Info: No Known Allergies.           Current Medications (04/05/2019):  This is the current hospital active medication list Current Facility-Administered Medications  Medication Dose Route Frequency Provider Last Rate Last Dose  . 0.9 %  sodium chloride infusion   Intravenous Continuous Gouru, Aruna, MD 75 mL/hr at 04/05/19 1013    . [MAR Hold] atorvastatin (LIPITOR) tablet 40 mg  40 mg Oral q1800 Ouma, Bing Neighbors, NP      . Doug Sou Hold] ceFAZolin (ANCEF) IVPB 1 g/50 mL premix  1 g Intravenous Once Hessie Knows, MD      . Doug Sou Hold] escitalopram (LEXAPRO) tablet 10 mg  10 mg Oral Daily Ouma, Bing Neighbors, NP      . fentaNYL (SUBLIMAZE) 100 MCG/2ML injection           . fentaNYL (SUBLIMAZE) injection 25 mcg  25 mcg Intravenous Q5 min PRN Martha Clan, MD   25 mcg at 04/05/19 1154  . [MAR Hold] hydrochlorothiazide (MICROZIDE) capsule 12.5 mg  12.5 mg Oral Daily Ouma, Bing Neighbors, NP      . Doug Sou Hold] HYDROcodone-acetaminophen (NORCO/VICODIN) 5-325 MG per tablet 1-2 tablet  1-2 tablet Oral Q6H PRN Lang Snow, NP   2 tablet at 04/05/19 0108  . [MAR Hold] labetalol (NORMODYNE) injection 10  mg  10 mg Intravenous Q2H PRN Lang Snow, NP      . Doug Sou Hold] Melatonin TABS 5 mg  5 mg Oral QHS Lang Snow, NP      . Doug Sou Hold] methotrexate (RHEUMATREX) tablet 12.5 mg  12.5 mg Oral Q Sun Ouma, Bing Neighbors, NP      . Doug Sou Hold] morphine 2 MG/ML injection 2 mg  2 mg Intravenous Q4H PRN Gouru, Aruna, MD   2 mg at 04/05/19 1012     Discharge Medications: Please see discharge summary for a list of discharge medications.  Relevant Imaging Results:  Relevant Lab Results:   Additional Information SSN: [id number]  Endre Coutts, Veronia Beets, LCSW

## 2019-04-05 NOTE — TOC Benefit Eligibility Note (Signed)
Transition of Care [facility]) Benefit Eligibility Note    Patient Details  Name: Victoria Gutierrez MRN: [id number] Date of Birth: 1945-09-19   Medication/Dose: Lovenox 40 mg daily x 14 days or generic (Enoxaparin)  Covered?: No        Spoke with Person/Company/Phone Number:: Raffael, [employer], [phone]     Prior Approval: [phone])     Additional Notes: No NDC listed on formulary for Lovenox or Enoxaparin so Prior Auth required    [employer] Phone Number: 04/05/2019, 10:47 AM

## 2019-04-05 NOTE — Anesthesia Post-op Follow-up Note (Signed)
Anesthesia QCDR form completed.        

## 2019-04-05 NOTE — ED Notes (Signed)
Pt transported to room 137.

## 2019-04-06 ENCOUNTER — Encounter: Payer: Self-pay | Admitting: Orthopedic Surgery

## 2019-04-06 LAB — BASIC METABOLIC PANEL
Anion gap: 5 (ref 5–15)
BUN: 16 mg/dL (ref 8–23)
CO2: 24 mmol/L (ref 22–32)
Calcium: 8.5 mg/dL — ABNORMAL LOW (ref 8.9–10.3)
Chloride: 107 mmol/L (ref 98–111)
Creatinine, Ser: 1.1 mg/dL — ABNORMAL HIGH (ref 0.44–1.00)
GFR calc Af Amer: 58 mL/min — ABNORMAL LOW (ref >60)
GFR calc non Af Amer: 50 mL/min — ABNORMAL LOW (ref >60)
Glucose, Bld: 140 mg/dL — ABNORMAL HIGH (ref 70–99)
Potassium: 4.2 mmol/L (ref 3.5–5.1)
Sodium: 136 mmol/L (ref 135–145)

## 2019-04-06 MED ORDER — METOPROLOL TARTRATE 25 MG PO TABS
12.5000 mg | ORAL_TABLET | Freq: Two times a day (BID) | ORAL | Status: DC
  Administered 2019-04-07 – 2019-04-09 (×5): 12.5 mg via ORAL
  Filled 2019-04-06 (×6): qty 1

## 2019-04-06 MED ORDER — ENOXAPARIN SODIUM 40 MG/0.4ML ~~LOC~~ SOLN
40.0000 mg | SUBCUTANEOUS | Status: DC
  Administered 2019-04-06 – 2019-04-08 (×3): 40 mg via SUBCUTANEOUS
  Filled 2019-04-06 (×3): qty 0.4

## 2019-04-06 NOTE — Progress Notes (Signed)
[place] [facility] [place] NAME: Victoria Gutierrez    MR#:  [id number]  DATE OF BIRTH:  05-05-1946  SUBJECTIVE:  CHIEF COMPLAINT: Patient is complaining of left leg pain, out of bed to chair Had hip surgery on 04/05/2019  REVIEW OF SYSTEMS:  CONSTITUTIONAL: No fever, fatigue or weakness.  EYES: No blurred or double vision.  EARS, NOSE, AND THROAT: No tinnitus or ear pain.  RESPIRATORY: No cough, shortness of breath, wheezing or hemoptysis.  CARDIOVASCULAR: No chest pain, orthopnea, edema.  GASTROINTESTINAL: No nausea, vomiting, diarrhea or abdominal pain.  GENITOURINARY: No dysuria, hematuria.  ENDOCRINE: No polyuria, nocturia,  HEMATOLOGY: No anemia, easy bruising or bleeding SKIN: No rash or lesion. MUSCULOSKELETAL: Left leg pain NEUROLOGIC: No tingling, numbness, weakness.  PSYCHIATRY: No anxiety or depression.   DRUG ALLERGIES:  No Known Allergies  VITALS:  Blood pressure (!) 173/81, pulse 81, temperature 99.7 F (37.6 C), temperature source Oral, resp. rate 18, height 5\' 6"  (1.676 m), weight 78.7 kg, SpO2 95 %.  PHYSICAL EXAMINATION:  GENERAL:  73 y.o.-year-old patient lying in the bed with no acute distress.  EYES: Pupils equal, round, reactive to light and accommodation. No scleral icterus. Extraocular muscles intact.  HEENT: Head atraumatic, normocephalic. Oropharynx and nasopharynx clear.  NECK:  Supple, no jugular venous distention. No thyroid enlargement, no tenderness.  LUNGS: Normal breath sounds bilaterally, no wheezing, rales,rhonchi or crepitation. No use of accessory muscles of respiration.  CARDIOVASCULAR: S1, S2 normal. No murmurs, rubs, or gallops.  ABDOMEN: Soft, nontender, nondistended. Bowel sounds present.  EXTREMITIES: Left leg is tender with clean dressing no pedal edema, cyanosis, or clubbing.  NEUROLOGIC: Cranial nerves II through XII are intact. Gait not checked.  PSYCHIATRIC: The patient is alert and  oriented x 3.  SKIN: No obvious rash, lesion, or ulcer.    LABORATORY PANEL:   CBC Recent Labs  Lab 04/04/19 2155  WBC 15.4*  HGB 12.7  HCT 38.4  PLT 259   ------------------------------------------------------------------------------------------------------------------  Chemistries  Recent Labs  Lab 04/06/19 0433  NA 136  K 4.2  CL 107  CO2 24  GLUCOSE 140*  BUN 16  CREATININE 1.10*  CALCIUM 8.5*   ------------------------------------------------------------------------------------------------------------------  Cardiac Enzymes No results for input(s): TROPONINI in the last 168 hours. ------------------------------------------------------------------------------------------------------------------  RADIOLOGY:  Dg Chest 1 View  Result Date: 04/04/2019 CLINICAL DATA:  Hip fracture EXAM: CHEST  1 VIEW COMPARISON:  January 20, 2019 FINDINGS: There is mild cardiomegaly. Aortic knob calcifications. Both lungs are clear. The visualized skeletal structures are unremarkable. IMPRESSION: No acute cardiopulmonary process. Electronically Signed   By: Prudencio Pair M.D.   On: 04/04/2019 22:38   Dg Hip Operative Unilat W Or W/o Pelvis Left  Result Date: 04/05/2019 CLINICAL DATA:  73 year old female undergoing repair of left femoral neck fracture. EXAM: OPERATIVE LEFT HIP (WITH PELVIS IF PERFORMED) 2 VIEWS TECHNIQUE: Fluoroscopic spot image(s) were submitted for interpretation post-operatively. COMPARISON:  Left hip series 04/04/2019. FINDINGS: The initial image demonstrates improved alignment about the left femoral neck fracture. On the 2nd image of left hip bipolar arthroplasty is in place. AP alignment appears normal. FLUOROSCOPY TIME:  0 minutes 6 seconds IMPRESSION: Left hip arthroplasty with no adverse features identified. Electronically Signed   By: Genevie Ann M.D.   On: 04/05/2019 14:57   Dg Hip Unilat W Or W/o Pelvis 2-3 Views Left  Result Date: 04/05/2019 CLINICAL DATA:  Left hip  replacement. EXAM: DG HIP (WITH OR WITHOUT PELVIS)  2-3V LEFT COMPARISON:  Left hip x-rays from yesterday. FINDINGS: The left hip demonstrates a total arthroplasty without evidence of hardware failure or complication. There is expected intra-articular air. There is no fracture or dislocation. The alignment is anatomic. Post-surgical changes noted in the surrounding soft tissues. Surgical drain in place. IMPRESSION: 1. Interval left total hip arthroplasty without acute postoperative complication. Electronically Signed   By: Titus Dubin M.D.   On: 04/05/2019 15:42   Dg Hip Unilat With Pelvis 2-3 Views Left  Result Date: 04/04/2019 CLINICAL DATA:  Fall, pain EXAM: DG HIP (WITH OR WITHOUT PELVIS) 2-3V LEFT COMPARISON:  None. FINDINGS: There is a comminuted mildly displaced left femoral head neck fracture. There is superior displacement of the femoral shaft. The femoral head still articulates with the acetabulum. There is a fracture fragment seen along the medial femoral neck. Dense vascular calcifications are seen. No other fractures identified. IMPRESSION: Comminuted mildly superiorly displaced left femoral head neck junction fracture. Electronically Signed   By: Prudencio Pair M.D.   On: 04/04/2019 22:37    EKG:   Orders placed or performed during the hospital encounter of 04/04/19  . EKG 12-Lead  . EKG 12-Lead  . ED EKG  . ED EKG    ASSESSMENT AND PLAN:    73 y.o. female with pertinent past medical history of hypertension, CVA and hyperlipidemia presenting to the ED with complaints of left hip pain status post mechanical fall.  1. Left femoral neck fracture -secondary to mechanical fall -POD # 1  - PRN. pain management - IV fluids hydration -Follow-up with orthopedics  2.  Leukocytosis: WBC 15.4 likely reactive in the setting of stress from mechanical fall - nml UA  - CXR shows no active cardiopulmonary process -Check CBC in a.m. - Check Procalcitonin if patient is febrile  3.  Acute on chronic kidney injury - Hold nephrotoxins - IV fluids hydration - Continue to monitor renal function  4. Hypertension - Hold lisinopril in the setting of AKI - Continue low-dose hydrochlorothiazide as creatinine slightly improved from previous - PRN labetalol  5. History of CVA - Resume aspirin and Plavix held for the procedure  6. HLD  - Atorvastatin 40mg  PO qhs  7. DVT prophylaxis -Lovenox subcu   PT is recommending home health PT  All the records are reviewed and case discussed with Care Management/Social Workerr. Management plans discussed with the patient, family and they are in agreement.  CODE STATUS: fc  TOTAL TIME TAKING CARE OF THIS PATIENT: 35  minutes.   POSSIBLE D/C IN  1-2 DAYS, DEPENDING ON CLINICAL CONDITION.  Note: This dictation was prepared with Dragon dictation along with smaller phrase technology. Any transcriptional errors that result from this process are unintentional.   Nicholes Mango M.D on 04/06/2019 at 4:31 PM  Between 7am to 6pm - Pager - [phone] After 6pm go to [email or web address] - password [email or web address] [facility]  [place]  Office  [phone]  CC: Primary care physician; [place] Pink, MD

## 2019-04-06 NOTE — Evaluation (Signed)
Physical Therapy Evaluation Patient Details Name: Victoria Gutierrez MRN: [id number] DOB: [birth year] Today's Date: 04/06/2019   History of Present Illness  Pt is a 73 y/o female s/p L THA (10/8) after suffering a fall at home, with PMH including HTN, CVA (12/2018), hyperlipidemia and CKI.  Clinical Impression  Pt is s/p L THA (10/8), anterior approach. Pt was previously independent with occasional use of AD (SPC) for increased stability on uneven surfaces, for primarily household and limited community ambulation. Pt performs bed mobility transferring supine>sit with CGA for maintaining anterior hip precautions, transfers sit>stand with mod A for for liftoff and balance with cuing for hand placement with use of RW. Pt ambulates with RW 33' with min A for balance and RW management for safety, requiring cuing to increase proximity to RW and maintain feet within base; cuing also provided during turns for small steps and to maintain anterior hip precautions. Pt will continue to benefit from skilled PT intervention to improve strength, range of motion, balance, and for further education to improve safety with functional mobility and promote return to prior level of function.     Follow Up Recommendations Home health PT    Equipment Recommendations  Rolling walker with 5" wheels    Recommendations for Other Services       Precautions / Restrictions Precautions Precautions: Anterior Hip;Fall Restrictions Weight Bearing Restrictions: Yes LLE Weight Bearing: Weight bearing as tolerated Other Position/Activity Restrictions: anterior hip precautions      Mobility  Bed Mobility Overal bed mobility: Needs Assistance Bed Mobility: Supine to Sit     Supine to sit: Min guard     General bed mobility comments: to ensure maintaining ant hip precautions  Transfers Overall transfer level: Needs assistance Equipment used: Rolling walker (2 wheeled) Transfers: Sit to/from Stand Sit to Stand: Mod  assist         General transfer comment: Mod A for transfer requiring extra time and assist with lift off  Ambulation/Gait Ambulation/Gait assistance: Min assist Gait Distance (Feet): 40 Feet Assistive device: Rolling walker (2 wheeled) Gait Pattern/deviations: Step-through pattern;Decreased step length - right     General Gait Details: Cuing with turning for anterior hip precautions, cuing to incr proximity to [employer] Mobility    Modified Rankin (Stroke Patients Only)       Balance Overall balance assessment: Needs assistance Sitting-balance support: Feet supported Sitting balance-Leahy Scale: Good     Standing balance support: Bilateral upper extremity supported Standing balance-Leahy Scale: Fair                               Pertinent Vitals/Pain Pain Assessment: No/denies pain(no pain at rest, increasing to 5/10 with mobility)    Home Living Family/patient expects to be discharged to:: Private residence Living Arrangements: Children Available Help at Discharge: Family;Available 24 hours/day(lives with daughter who works from home) Type of Home: House Home Access: Stairs to enter Entrance Stairs-Rails: None Technical brewer of Steps: 2 Home Layout: One level Home Equipment: Environmental consultant - 2 wheels;Bedside commode;Cane - quad;Cane - single point;Wheelchair - manual      Prior Function Level of Independence: Independent with assistive device(s)         Comments: reports use of cane on unlevel surfaces, currently primarily household distances and short community distances     Hand Dominance        Extremity/Trunk  Assessment   Upper Extremity Assessment Upper Extremity Assessment: Overall WFL for tasks assessed    Lower Extremity Assessment Lower Extremity Assessment: LLE deficits/detail LLE Sensation: decreased light touch(numbness at lateral thigh; diminished sensation along LLE)       Communication    Communication: No difficulties  Cognition Arousal/Alertness: Awake/alert Behavior During Therapy: WFL for tasks assessed/performed Overall Cognitive Status: Within Functional Limits for tasks assessed                                        General Comments      Exercises Total Joint Exercises Ankle Circles/Pumps: AROM;Both;20 reps Heel Slides: AAROM;Left;10 reps   Assessment/Plan    PT Assessment Patient needs continued PT services  PT Problem List Decreased strength;Decreased mobility;Decreased safety awareness;Decreased range of motion;Decreased activity tolerance;Decreased balance       PT Treatment Interventions DME instruction;Therapeutic exercise;Balance training;Gait training;Stair training;Neuromuscular re-education;Functional mobility training;Therapeutic activities;Patient/family education    PT Goals (Current goals can be found in the Care Plan section)  Acute Rehab PT Goals Patient Stated Goal: to be able to get up and walk around PT Goal Formulation: With patient Time For Goal Achievement: 04/20/19 Potential to Achieve Goals: Good    Frequency BID   Barriers to discharge        Co-evaluation               AM-PAC PT "6 Clicks" Mobility  Outcome Measure Help needed turning from your back to your side while in a flat bed without using bedrails?: A Little Help needed moving from lying on your back to sitting on the side of a flat bed without using bedrails?: A Little Help needed moving to and from a bed to a chair (including a wheelchair)?: A Lot Help needed standing up from a chair using your arms (e.g., wheelchair or bedside chair)?: A Lot Help needed to walk in hospital room?: A Little Help needed climbing 3-5 steps with a railing? : A Lot 6 Click Score: 15    End of Session Equipment Utilized During Treatment: Gait belt Activity Tolerance: Patient tolerated treatment well Patient left: in chair;with call bell/phone within  reach;with chair alarm set;with SCD's reapplied Nurse Communication: Mobility status PT Visit Diagnosis: Unsteadiness on feet (R26.81);Muscle weakness (generalized) (M62.81);Difficulty in walking, not elsewhere classified (R26.2)    Time: [id number] PT Time Calculation (min) (ACUTE ONLY): 42 min   Charges:   PT Evaluation $PT Eval Moderate Complexity: 1 Mod PT Treatments $Gait Training: 8-22 mins        Petra Kuba, PT, DPT 04/06/19, 11:20 AM

## 2019-04-06 NOTE — Evaluation (Signed)
Occupational Therapy Evaluation Patient Details Name: Victoria Gutierrez MRN: [id number] DOB: 04/27/1946 Today's Date: 04/06/2019    History of Present Illness Pt is a 73 y/o female s/p L THA (10/8) after suffering a fall at home, with PMH including HTN, CVA (12/2018), hyperlipidemia and CKI.   Clinical Impression   Victoria Gutierrez was seen for OT evaluation this date, POD#1 from above surgery. Pt was independent in all ADLs prior to surgery, however occasionally using SPC or 2WW for mobility secondary to her recent CVA. Pt reports she had generally regained her functional capacity in her RLE/RUE after her stroke in July, but reports some remaining weakness particularly in her RLE. She was pleased to be able to walk down to her mail box again, just prior to her fall. Pt is eager to return to PLOF with less pain and improved safety and independence. Pt currently requires minimal to moderate assist for LB dressing and bathing while in seated position due to pain and limited AROM of L hip. Pt instructed in self care skills, falls prevention strategies, home/routines modifications, DME/AE for LB bathing and dressing tasks, and compression stocking mgt strategies. Pt would benefit from additional instruction in self care skills and techniques to help maintain precautions with or without assistive devices to support recall and carryover prior to discharge. Recommend HHOT upon discharge.      Follow Up Recommendations  Home health OT    Equipment Recommendations  None recommended by OT(Pt has BSC)    Recommendations for Other Services       Precautions / Restrictions Precautions Precautions: Anterior Hip;Fall Restrictions Weight Bearing Restrictions: Yes LLE Weight Bearing: Weight bearing as tolerated Other Position/Activity Restrictions: anterior hip precautions      Mobility Bed Mobility Overal bed mobility: Needs Assistance Bed Mobility: Supine to Sit     Supine to sit: Min guard      General bed mobility comments: Deferred. Pt up in recliner at start/end of session.  Transfers Overall transfer level: Needs assistance Equipment used: Rolling walker (2 wheeled) Transfers: Sit to/from Stand Sit to Stand: Supervision;Min guard         General transfer comment: Pt completed STS from room recliner and BSC with min VC's for hand placement. OT provided SBA for mgt of lines/leads otherwise pt was able to complete functional transfers with good safety awareness given increased time/effort.    Balance Overall balance assessment: Needs assistance Sitting-balance support: Feet supported Sitting balance-Leahy Scale: Good Sitting balance - Comments: Steady static and dynamic sitting and reaching within BOS.   Standing balance support: Bilateral upper extremity supported;During functional activity Standing balance-Leahy Scale: Fair Standing balance comment: Pt required BUE support to maintain standing balance during functional grooming tasks this date. She tended to stand leaned over sink with elbows resting on the counter surface. Limited upright standing posture noted unless ambulating.                           ADL either performed or assessed with clinical judgement   ADL                                         General ADL Comments: Pt requires min to moderate assist for LB ADL mgt in seated position due to increased pain and decreased ROM in her LLE. Pt states she has generally recovered from her  recent stroke and despite some RUE/RLE weakness does not require assistance for ADLs at baseline. Would benefit from opportunity to trial AE For LB ADL mgt.     Vision Baseline Vision/History: Wears glasses(Needs glasses, doesn't have them.) Patient Visual Report: No change from baseline       Perception     Praxis      Pertinent Vitals/Pain Pain Assessment: 0-10 Pain Score: 2  Pain Location: L LE increases with mobility Pain Descriptors /  Indicators: Aching;Sore;Burning;Grimacing;Operative site guarding Pain Intervention(s): Limited activity within patient's tolerance;Monitored during session;Repositioned;Utilized relaxation techniques     Hand Dominance Right   Extremity/Trunk Assessment Upper Extremity Assessment Upper Extremity Assessment: Overall WFL for tasks assessed(Pt BUE brossly WFL during functional activity. No difficulty using R/L UE to support self on walker and used BUE to complete functional grooming without difficulty this date.)   Lower Extremity Assessment Lower Extremity Assessment: LLE deficits/detail;Defer to PT evaluation LLE Deficits / Details: s/p L THA LLE Sensation: decreased light touch(numbness at lateral thigh; diminished sensation along LLE) LLE Coordination: decreased gross motor       Communication Communication Communication: No difficulties   Cognition Arousal/Alertness: Awake/alert Behavior During Therapy: WFL for tasks assessed/performed Overall Cognitive Status: Within Functional Limits for tasks assessed                                     General Comments  Wound vac in place at start/end of sesison.    Exercises Total Joint Exercises Ankle Circles/Pumps: AROM;Both;20 reps Heel Slides: AAROM;Left;10 reps Other Exercises Other Exercises: Pt educated in falls prevention strategies, safe use of AE/DME for ADL mgt and functional mobility, compression stocking mgt, and safe transfer strategies this date. Handout provided. Pt would benefit from opportunity to trial LB dressing using AE. Other Exercises: Pt assisted with toileting and standing grooming tasks this date. PT required set-up and supervision for safety throughout.   Shoulder Instructions      Home Living Family/patient expects to be discharged to:: Private residence Living Arrangements: Children Available Help at Discharge: Family;Available 24 hours/day(Dtr works from home) Type of Home: House Home  Access: Stairs to enter [employer] of Steps: 2 Entrance Stairs-Rails: None Home Layout: One level     Bathroom Shower/Tub: Occupational psychologist: Handicapped height     Home Equipment: Environmental consultant - 2 wheels;Bedside commode;Cane - quad;Cane - single point;Wheelchair - manual          Prior Functioning/Environment Level of Independence: Independent with assistive device(s)        Comments: reports use of cane on unlevel surfaces, and RW for HH distances.  Pt states since her stroke she has only walked as far as her mail box.        OT Problem List: Decreased strength;Decreased coordination;Pain;Decreased range of motion;Decreased cognition;Decreased activity tolerance;Decreased safety awareness;Impaired balance (sitting and/or standing);Decreased knowledge of use of DME or AE;Decreased knowledge of precautions      OT Treatment/Interventions: Self-care/ADL training;Balance training;Therapeutic exercise;Therapeutic activities;Energy conservation;DME and/or AE instruction;Patient/family education    OT Goals(Current goals can be found in the care plan section) Acute Rehab OT Goals Patient Stated Goal: To get back to feeling like myself again OT Goal Formulation: With patient Time For Goal Achievement: 04/20/19 Potential to Achieve Goals: Good ADL Goals Pt Will Perform Lower Body Bathing: with set-up;with supervision;sitting/lateral leans(With LRAD PRN for improved safety and functional independence.) Pt Will Perform Lower  Body Dressing: with set-up;with supervision;with adaptive equipment(With LRAD PRN for improved safety and functional independence.) Pt Will Transfer to Toilet: with modified independence;ambulating(With LRAD PRN for improved safety and functional independence.) Pt Will Perform Toileting - Clothing Manipulation and hygiene: sit to/from stand;with modified independence;with adaptive equipment(With LRAD PRN for improved safety and functional  independence.)  OT Frequency: Min 1X/week   Barriers to D/C:            Co-evaluation              AM-PAC OT "6 Clicks" Daily Activity     Outcome Measure Help from another person eating meals?: None Help from another person taking care of personal grooming?: A Little Help from another person toileting, which includes using toliet, bedpan, or urinal?: A Little Help from another person bathing (including washing, rinsing, drying)?: A Lot Help from another person to put on and taking off regular upper body clothing?: A Little Help from another person to put on and taking off regular lower body clothing?: A Little 6 Click Score: 18   End of Session Equipment Utilized During Treatment: Gait belt;Rolling walker  Activity Tolerance: Patient tolerated treatment well Patient left: in chair;with call bell/phone within reach;with chair alarm set;with SCD's reapplied  OT Visit Diagnosis: Other abnormalities of gait and mobility (R26.89);Pain Pain - Right/Left: Left Pain - part of body: Hip                Time: 1010-1056 OT Time Calculation (min): 46 min Charges:  OT General Charges $OT Visit: 1 Visit OT Treatments $Self Care/Home Management : 23-37 mins  Shara Blazing, M.S., OTR/L Ascom: [phone] 04/06/19, 1:46 PM

## 2019-04-06 NOTE — Anesthesia Postprocedure Evaluation (Signed)
Anesthesia Post Note  Patient: KENYADA BLIZARD  Procedure(s) Performed: TOTAL HIP ARTHROPLASTY ANTERIOR APPROACH (Left Hip)  Patient location during evaluation: PACU Anesthesia Type: General Level of consciousness: awake and alert Pain management: pain level controlled Vital Signs Assessment: post-procedure vital signs reviewed and stable Respiratory status: spontaneous breathing, nonlabored ventilation, respiratory function stable and patient connected to nasal cannula oxygen Cardiovascular status: blood pressure returned to baseline and stable Postop Assessment: no apparent nausea or vomiting Anesthetic complications: no     Last Vitals:  Vitals:   04/06/19 0338 04/06/19 0754  BP: 139/78 134/87  Pulse: 79 80  Resp: 15 17  Temp: 36.7 C 37.1 C  SpO2: 94% 97%    Last Pain:  Vitals:   04/06/19 0754  TempSrc: Oral  PainSc:                  Martha Clan

## 2019-04-06 NOTE — Progress Notes (Signed)
Physical Therapy Treatment Patient Details Name: Victoria Gutierrez MRN: [id number] DOB: 16-Oct-1945 Today's Date: 04/06/2019    History of Present Illness Pt is a 73 y/o female s/p L THA (10/8) after suffering a fall at home, with PMH including HTN, CVA (12/2018), hyperlipidemia and CKI.    PT Comments    Pt is now 1 day s/p L THA (anterior). Pt continues to have pain well-controlled, reporting 1/10 at start and increasing to 5/10 with ambulation. Pt requesting to use the bathroom; able to ambulate to toilet in room with CGA and RW, though demonstrating mild shortness of breath. Pt requiring min A for supine>sit to upright trunk though returns to bed with supervision only performing sit>supine. Pt requiring CGA for safety with STS from edge of bed and min A from toilet, and verbal cuing for safe hand placement with RW use. Pt ambulates 2x20' to bathroom and within room; continues to require frequent verbal cuing during gait for RW management to maintain within RW base for closer proximity and when turning; pt initially demonstrates step-to pattern though able to progress to step-through pattern with verbal cuing; pt demonstrates mild shortness of breath during gait trials though HR and O2 assessed and within normal range.    Follow Up Recommendations  Home health PT     Equipment Recommendations  Rolling walker with 5" wheels    Recommendations for Other Services       Precautions / Restrictions Precautions Precautions: Anterior Hip;Fall Restrictions Weight Bearing Restrictions: Yes LLE Weight Bearing: Weight bearing as tolerated Other Position/Activity Restrictions: anterior hip precautions    Mobility  Bed Mobility Overal bed mobility: Needs Assistance Bed Mobility: Supine to Sit     Supine to sit: Min assist     General bed mobility comments: Required slightly increased assistance exiting R side of the bed compared to L side this AM for upright trunk  Transfers Overall  transfer level: Needs assistance Equipment used: Rolling walker (2 wheeled) Transfers: Sit to/from Stand Sit to Stand: Min guard         General transfer comment: Pt completed STS from edge of bed and toilet requiring CGA for safety with verbal cuing for hand placement to push up from surface vs. grasping RW.  Ambulation/Gait Ambulation/Gait assistance: Min guard Gait Distance (Feet): 20 Feet(2 trials of 20 ft; mild shortness of breath) Assistive device: Rolling walker (2 wheeled) Gait Pattern/deviations: Step-through pattern;Decreased step length - right;Step-to pattern     General Gait Details: Initially with step-to pattern; able to improve right step length with cuing and progress to step-through pattern.   Stairs             Wheelchair Mobility    Modified Rankin (Stroke Patients Only)       Balance Overall balance assessment: Needs assistance Sitting-balance support: Feet supported Sitting balance-Leahy Scale: Good Sitting balance - Comments: Steady static and dynamic sitting and reaching within BOS.   Standing balance support: Bilateral upper extremity supported;During functional activity Standing balance-Leahy Scale: Fair Standing balance comment: Pt required BUE support to maintain standing balance during functional grooming tasks this date. She tended to stand leaned over sink with elbows resting on the counter surface. Limited upright standing posture noted unless ambulating.                            Cognition Arousal/Alertness: Awake/alert Behavior During Therapy: WFL for tasks assessed/performed Overall Cognitive Status: Within Functional Limits for tasks assessed  Exercises Total Joint Exercises Ankle Circles/Pumps: AROM;Both;20 reps Quad Sets: Strengthening;10 reps;Left Gluteal Sets: Strengthening;Left;10 reps Heel Slides: AAROM;Left;10 reps Other Exercises Other Exercises: Pt  educated in falls prevention strategies, safe use of AE/DME for ADL mgt and functional mobility, compression stocking mgt, and safe transfer strategies this date. Handout provided. Pt would benefit from opportunity to trial LB dressing using AE. Other Exercises: Pt assisted with toileting and standing grooming tasks this date. PT required set-up and supervision for safety throughout.    General Comments General comments (skin integrity, edema, etc.): Wound vac in place at start/end of sesison.      Pertinent Vitals/Pain Pain Assessment: 0-10 Pain Score: 1  Pain Location: L LE increases with mobility Pain Descriptors / Indicators: Sore;Operative site guarding Pain Intervention(s): Limited activity within patient's tolerance;Monitored during session    [place] expects to be discharged to:: Private residence Living Arrangements: Children Available Help at Discharge: Family;Available 24 hours/day(Dtr works from home) Type of Home: House Home Access: Stairs to enter Entrance Stairs-Rails: None Home Layout: One level Home Equipment: Environmental consultant - 2 wheels;Bedside commode;Cane - quad;Cane - single point;Wheelchair - manual      Prior Function Level of Independence: Independent with assistive device(s)      Comments: reports use of cane on unlevel surfaces, and RW for HH distances.  Pt states since her stroke she has only walked as far as her mail box.   PT Goals (current goals can now be found in the care plan section) Acute Rehab PT Goals Patient Stated Goal: I'd like to go to the bathroom PT Goal Formulation: With patient Time For Goal Achievement: 04/20/19 Potential to Achieve Goals: Good Progress towards PT goals: Progressing toward goals    Frequency    BID      PT Plan Current plan remains appropriate    Co-evaluation              AM-PAC PT "6 Clicks" Mobility   Outcome Measure  Help needed turning from your back to your side while in a flat bed  without using bedrails?: A Little Help needed moving from lying on your back to sitting on the side of a flat bed without using bedrails?: A Little Help needed moving to and from a bed to a chair (including a wheelchair)?: A Little Help needed standing up from a chair using your arms (e.g., wheelchair or bedside chair)?: A Little Help needed to walk in hospital room?: A Little Help needed climbing 3-5 steps with a railing? : A Lot 6 Click Score: 17    End of Session Equipment Utilized During Treatment: Gait belt Activity Tolerance: Patient tolerated treatment well Patient left: in bed;with call bell/phone within reach;with SCD's reapplied;with bed alarm set   PT Visit Diagnosis: Unsteadiness on feet (R26.81);Muscle weakness (generalized) (M62.81);Difficulty in walking, not elsewhere classified (R26.2)     Time: 1410-1440 PT Time Calculation (min) (ACUTE ONLY): 30 min  Charges:  $Gait Training: 8-22 mins $Therapeutic Exercise: 8-22 mins                      Madilyn Hook 04/06/2019, 3:31 PM

## 2019-04-06 NOTE — TOC Progression Note (Addendum)
Transition of Care [facility]) - Progression Note    Patient Details  Name: Victoria Gutierrez MRN: [id number] Date of Birth: 1945/10/13  [facility] CM/SW Contact  Jarae Nemmers, Lenice Llamas Phone Number: [phone]  04/06/2019, 2:15 PM  Clinical Narrative: PT and OT are recommending home health. Clinical Social Worker (CSW) met with patient and made her aware of above. Patient is agreeable to D/C home and resume home health services with [facility]. CSW made [place] home health representative aware that patient will need home health PT, OT and an aide. Per [name] they can provide those services to patient. Patient reported that she has a rolling walker and bedside commode at home. Per MD patient will not be on Lovenox. CSW attempted to contact patient's daughter Sissy Hoff via telephone however she did not answer and her voicemail was full. CSW will continue to follow and assist as needed.     Expected Discharge Plan: [place] Barriers to Discharge: Continued Medical Work up  Expected Discharge Plan and Services Expected Discharge Plan: [place] In-house Referral: Clinical Social Work     Living arrangements for the past 2 months: Single Family Home                 DME Arranged: N/A         HH Arranged: PT HH Agency: [facility] Date [place]: 04/05/19 Time [place]: 1206 Representative spoke with at [place]: [name]   Social Determinants of Health ([place]) Interventions    Readmission Risk Interventions No flowsheet data found.

## 2019-04-07 LAB — CBC WITH DIFFERENTIAL/PLATELET
Abs Immature Granulocytes: 0.1 10*3/uL — ABNORMAL HIGH (ref 0.00–0.07)
Basophils Absolute: 0.1 10*3/uL (ref 0.0–0.1)
Basophils Relative: 1 %
Eosinophils Absolute: 0.2 10*3/uL (ref 0.0–0.5)
Eosinophils Relative: 2 %
HCT: 33.3 % — ABNORMAL LOW (ref 36.0–46.0)
Hemoglobin: 11 g/dL — ABNORMAL LOW (ref 12.0–15.0)
Immature Granulocytes: 1 %
Lymphocytes Relative: 20 %
Lymphs Abs: 2.5 10*3/uL (ref 0.7–4.0)
MCH: 34.1 pg — ABNORMAL HIGH (ref 26.0–34.0)
MCHC: 33 g/dL (ref 30.0–36.0)
MCV: 103.1 fL — ABNORMAL HIGH (ref 80.0–100.0)
Monocytes Absolute: 1.3 10*3/uL — ABNORMAL HIGH (ref 0.1–1.0)
Monocytes Relative: 10 %
Neutro Abs: 8.5 10*3/uL — ABNORMAL HIGH (ref 1.7–7.7)
Neutrophils Relative %: 66 %
Platelets: 187 10*3/uL (ref 150–400)
RBC: 3.23 MIL/uL — ABNORMAL LOW (ref 3.87–5.11)
RDW: 14.6 % (ref 11.5–15.5)
WBC: 12.7 10*3/uL — ABNORMAL HIGH (ref 4.0–10.5)
nRBC: 0 % (ref 0.0–0.2)

## 2019-04-07 NOTE — Progress Notes (Addendum)
  Subjective: 2 Days Post-Op Procedure(s) (LRB): TOTAL HIP ARTHROPLASTY ANTERIOR APPROACH (Left) Patient reports pain as 4 on 0-10 scale.   Patient is well, and has had no acute complaints or problems. States that her leg still feels numb, otherwise no issues. Plan is to go Home after hospital stay. Negative for chest pain and shortness of breath Fever: no Gastrointestinal:Negative for nausea and vomiting  Objective: Vital signs in last 24 hours: Temp:  [98.6 F (37 C)-99.7 F (37.6 C)] 98.6 F (37 C) (10/10 0832) Pulse Rate:  [72-118] 73 (10/10 0832) Resp:  [15-18] 16 (10/10 0832) BP: (144-174)/(70-83) 174/83 (10/10 0832) SpO2:  [94 %-100 %] 100 % (10/10 0832)  Intake/Output from previous day:  Intake/Output Summary (Last 24 hours) at 04/07/2019 0851 Last data filed at 04/06/2019 1736 Gross per 24 hour  Intake 1205.67 ml  Output 1 ml  Net 1204.67 ml    Intake/Output this shift: No intake/output data recorded.  Labs: Recent Labs    04/04/19 2155 04/07/19 0513  HGB 12.7 11.0*   Recent Labs    04/04/19 2155 04/07/19 0513  WBC 15.4* 12.7*  RBC 3.78* 3.23*  HCT 38.4 33.3*  PLT 259 187   Recent Labs    04/04/19 2155 04/06/19 0433  NA 140 136  K 4.0 4.2  CL 106 107  CO2 27 24  BUN 16 16  CREATININE 1.16* 1.10*  GLUCOSE 106* 140*  CALCIUM 8.9 8.5*   No results for input(s): LABPT, INR in the last 72 hours.   EXAM General - Patient is Alert, Appropriate and Oriented Extremity - Neurovascular intact Dorsiflexion/Plantar flexion intact Compartment soft Dressing/Incision -wound VAC is clean and in place. Motor Function - intact, moving foot and toes well on exam.    Assessment/Plan: 2 Days Post-Op Procedure(s) (LRB): TOTAL HIP ARTHROPLASTY ANTERIOR APPROACH (Left) Active Problems:   Displaced fracture of left femoral neck (HCC)  Estimated body mass index is 28 kg/m as calculated from the following:   Height as of this encounter: 5\' 6"  (1.676 m).   Weight as of this encounter: 78.7 kg. Advance diet Up with therapy  DVT Prophylaxis - Lovenox, SCDs Weight-Bearing as tolerated to left leg  Cassell Smiles, PA-C [facility] 04/07/2019, 8:51 AM

## 2019-04-07 NOTE — Progress Notes (Signed)
Physical Therapy Treatment Patient Details Name: Victoria Gutierrez MRN: [id number] DOB: 05/14/1946 Today's Date: 04/07/2019    History of Present Illness Pt is a 73 y/o female s/p L THA (10/8) after suffering a fall at home, with PMH including HTN, CVA (12/2018), hyperlipidemia and CKI.    PT Comments    Pt in bed, ready to get up.  Participated in exercises as described below.  To edge of bed with RW and min assist.  Increased time given.  Stood with min a with assist to rise and for hand placements.  She was able to progress gait into hallway today but remains limited in distances.  Remained in recliner after session.   Follow Up Recommendations  Home health PT     Equipment Recommendations  Rolling walker with 5" wheels;3in1 (PT)    Recommendations for Other Services       Precautions / Restrictions Precautions Precautions: Anterior Hip;Fall Restrictions Weight Bearing Restrictions: Yes LLE Weight Bearing: Weight bearing as tolerated    Mobility  Bed Mobility Overal bed mobility: Needs Assistance Bed Mobility: Supine to Sit     Supine to sit: Min assist     General bed mobility comments: Required slightly increased assistance exiting R side of the bed compared to L side this AM for upright trunk  Transfers Overall transfer level: Needs assistance Equipment used: Rolling walker (2 wheeled) Transfers: Sit to/from Stand Sit to Stand: Min assist         General transfer comment: Pt completed STS from edge of bed and toilet requiring min a today with verbal cuing for hand placement to push up from surface vs. grasping RW.  Ambulation/Gait Ambulation/Gait assistance: Min guard;Min assist Gait Distance (Feet): 50 Feet Assistive device: Rolling walker (2 wheeled) Gait Pattern/deviations: Step-through pattern;Decreased step length - right;Step-to pattern Gait velocity: decreased   General Gait Details: Initially with step-to pattern; able to improve right step  length with cuing and progress to step-through pattern.   Stairs             Wheelchair Mobility    Modified Rankin (Stroke Patients Only)       Balance Overall balance assessment: Needs assistance Sitting-balance support: Feet supported Sitting balance-Leahy Scale: Good Sitting balance - Comments: Steady static and dynamic sitting and reaching within BOS.   Standing balance support: Bilateral upper extremity supported;During functional activity Standing balance-Leahy Scale: Fair                              Cognition Arousal/Alertness: Awake/alert Behavior During Therapy: WFL for tasks assessed/performed Overall Cognitive Status: Within Functional Limits for tasks assessed                                        Exercises Total Joint Exercises Ankle Circles/Pumps: AROM;Both;20 reps Quad Sets: Strengthening;10 reps;Left Gluteal Sets: Strengthening;Left;10 reps Heel Slides: AAROM;Left;10 reps    General Comments        Pertinent Vitals/Pain Pain Assessment: 0-10 Pain Score: 4  Pain Location: L LE increases with mobility Pain Descriptors / Indicators: Sore;Operative site guarding Pain Intervention(s): Limited activity within patient's tolerance;Monitored during session;Premedicated before session    Home Living                      Prior Function  PT Goals (current goals can now be found in the care plan section) Acute Rehab PT Goals Patient Stated Goal: I'd like to go to the bathroom PT Goal Formulation: With patient Time For Goal Achievement: 04/20/19 Potential to Achieve Goals: Good Progress towards PT goals: Progressing toward goals    Frequency    BID      PT Plan Current plan remains appropriate    Co-evaluation              AM-PAC PT "6 Clicks" Mobility   Outcome Measure  Help needed turning from your back to your side while in a flat bed without using bedrails?: A Little Help  needed moving from lying on your back to sitting on the side of a flat bed without using bedrails?: A Little Help needed moving to and from a bed to a chair (including a wheelchair)?: A Little Help needed standing up from a chair using your arms (e.g., wheelchair or bedside chair)?: A Little Help needed to walk in hospital room?: A Little Help needed climbing 3-5 steps with a railing? : A Lot 6 Click Score: 17    End of Session Equipment Utilized During Treatment: Gait belt Activity Tolerance: Patient tolerated treatment well Patient left: in bed;with call bell/phone within reach;with SCD's reapplied;with bed alarm set         Time: [id number] PT Time Calculation (min) (ACUTE ONLY): 16 min  Charges:  $Gait Training: 8-22 mins                    Chesley Noon, PTA 04/07/19, 12:21 PM

## 2019-04-07 NOTE — Progress Notes (Signed)
Physical Therapy Treatment Patient Details Name: Victoria Gutierrez MRN: [id number] DOB: August 09, 1945 Today's Date: 04/07/2019    History of Present Illness Pt is a 73 y/o female s/p L THA (10/8) after suffering a fall at home, with PMH including HTN, CVA (12/2018), hyperlipidemia and CKI.    PT Comments    Pt ready to get up and return to bed.  Stood with min a x 1 and was able to progress gait to 47' with min assist.  Some SOB noted but pt stated that is her baseline.  Sats WFL.  She was able to void in commode and return to bed with min a x 1.  Pt continues to need +1 assist with mobility for balance and safety.  She stated she lives with her daughter who can provide 24 hour care.  They have 3 steps to enter with no rails which she stated they are addressing today.  She wishes to return home with her family to continue her recovery.  As long as they can provide care HHPT still remains a viable option for discharge but if not, SNF may be necessary upon discharge.   Follow Up Recommendations  Home health PT     Equipment Recommendations  Rolling walker with 5" wheels;3in1 (PT)    Recommendations for Other Services       Precautions / Restrictions Precautions Precautions: Anterior Hip;Fall Restrictions Weight Bearing Restrictions: Yes LLE Weight Bearing: Weight bearing as tolerated    Mobility  Bed Mobility Overal bed mobility: Needs Assistance Bed Mobility: Sit to Supine     Supine to sit: Min assist     General bed mobility comments: Required slightly increased assistance exiting R side of the bed compared to L side this AM for upright trunk  Transfers Overall transfer level: Needs assistance Equipment used: Rolling walker (2 wheeled) Transfers: Sit to/from Stand Sit to Stand: Min assist         General transfer comment: Pt completed STS from edge of bed and toilet requiring min a today with verbal cuing for hand placement to push up from surface vs. grasping  RW.  Ambulation/Gait Ambulation/Gait assistance: Min guard;Min assist Gait Distance (Feet): 70 Feet Assistive device: Rolling walker (2 wheeled) Gait Pattern/deviations: Step-through pattern;Decreased step length - right;Step-to pattern Gait velocity: decreased   General Gait Details: Initially with step-to pattern; able to improve right step length with cuing and progress to step-through pattern.   Stairs             Wheelchair Mobility    Modified Rankin (Stroke Patients Only)       Balance Overall balance assessment: Needs assistance Sitting-balance support: Feet supported Sitting balance-Leahy Scale: Good Sitting balance - Comments: Steady static and dynamic sitting and reaching within BOS.   Standing balance support: Bilateral upper extremity supported;During functional activity Standing balance-Leahy Scale: Fair Standing balance comment: occasional min a due to imbalances - requires +1 assist for mobilty at all times                            Cognition Arousal/Alertness: Awake/alert Behavior During Therapy: WFL for tasks assessed/performed Overall Cognitive Status: Within Functional Limits for tasks assessed                                        Exercises Total Joint Exercises Ankle Circles/Pumps: AROM;Both;20 reps  Quad Sets: Strengthening;10 reps;Left Gluteal Sets: Strengthening;Left;10 reps Heel Slides: AAROM;Left;10 reps Other Exercises Other Exercises: to commode to void    General Comments        Pertinent Vitals/Pain Pain Assessment: 0-10 Pain Score: 4  Pain Location: L LE increases with mobility Pain Descriptors / Indicators: Sore;Operative site guarding Pain Intervention(s): Monitored during session;Premedicated before session;Repositioned    Home Living                      Prior Function            PT Goals (current goals can now be found in the care plan section) Acute Rehab PT  Goals Patient Stated Goal: I'd like to go to the bathroom PT Goal Formulation: With patient Time For Goal Achievement: 04/20/19 Potential to Achieve Goals: Good Progress towards PT goals: Progressing toward goals    Frequency    BID      PT Plan Current plan remains appropriate    Co-evaluation              AM-PAC PT "6 Clicks" Mobility   Outcome Measure  Help needed turning from your back to your side while in a flat bed without using bedrails?: A Little Help needed moving from lying on your back to sitting on the side of a flat bed without using bedrails?: A Little Help needed moving to and from a bed to a chair (including a wheelchair)?: A Little Help needed standing up from a chair using your arms (e.g., wheelchair or bedside chair)?: A Little Help needed to walk in hospital room?: A Little Help needed climbing 3-5 steps with a railing? : A Little 6 Click Score: 18    End of Session Equipment Utilized During Treatment: Gait belt Activity Tolerance: Patient tolerated treatment well Patient left: in bed;with call bell/phone within reach;with SCD's reapplied;with bed alarm set Nurse Communication: Mobility status       Time: [id number] PT Time Calculation (min) (ACUTE ONLY): 18 min  Charges:  $Gait Training: 8-22 mins                    Chesley Noon, PTA 04/07/19, 1:08 PM

## 2019-04-07 NOTE — Progress Notes (Signed)
[facility] NAME: Victoria Gutierrez    MR#:  [id number]  DATE OF BIRTH:  [birth year]  SUBJECTIVE:  CHIEF COMPLAINT: Patient is complaining of left leg pain, improving slowly. Had hip surgery on 04/05/2019 REVIEW OF SYSTEMS:  CONSTITUTIONAL: No fever, fatigue or weakness.  EYES: No blurred or double vision.  EARS, NOSE, AND THROAT: No tinnitus or ear pain.  RESPIRATORY: No cough, shortness of breath, wheezing or hemoptysis.  CARDIOVASCULAR: No chest pain, orthopnea, edema.  GASTROINTESTINAL: No nausea, vomiting, diarrhea or abdominal pain.  GENITOURINARY: No dysuria, hematuria.  ENDOCRINE: No polyuria, nocturia,  HEMATOLOGY: No anemia, easy bruising or bleeding SKIN: No rash or lesion. MUSCULOSKELETAL: Left leg pain NEUROLOGIC: No tingling, numbness, weakness.  PSYCHIATRY: No anxiety or depression.   DRUG ALLERGIES:  No Known Allergies  VITALS:  Blood pressure (!) 174/83, pulse 73, temperature 98.6 F (37 C), resp. rate 16, height 5\' 6"  (1.676 m), weight 78.7 kg, SpO2 100 %.  PHYSICAL EXAMINATION:  GENERAL:  73 y.o.-year-old patient lying in the bed with no acute distress.  EYES: Pupils equal, round, reactive to light and accommodation. No scleral icterus. Extraocular muscles intact.  HEENT: Head atraumatic, normocephalic. Oropharynx and nasopharynx clear.  NECK:  Supple, no jugular venous distention. No thyroid enlargement, no tenderness.  LUNGS: Normal breath sounds bilaterally, no wheezing, rales,rhonchi or crepitation. No use of accessory muscles of respiration.  CARDIOVASCULAR: S1, S2 normal. No murmurs, rubs, or gallops.  ABDOMEN: Soft, nontender, nondistended. Bowel sounds present.  EXTREMITIES: Left leg is tender with clean dressing no pedal edema, cyanosis, or clubbing.  NEUROLOGIC: Cranial nerves II through XII are intact. Gait not checked.  PSYCHIATRIC: The patient is alert and oriented x 3.  SKIN: No obvious  rash, lesion, or ulcer.    LABORATORY PANEL:   CBC Recent Labs  Lab 04/07/19 0513  WBC 12.7*  HGB 11.0*  HCT 33.3*  PLT 187   ------------------------------------------------------------------------------------------------------------------  Chemistries  Recent Labs  Lab 04/06/19 0433  NA 136  K 4.2  CL 107  CO2 24  GLUCOSE 140*  BUN 16  CREATININE 1.10*  CALCIUM 8.5*   ------------------------------------------------------------------------------------------------------------------  Cardiac Enzymes No results for input(s): TROPONINI in the last 168 hours. ------------------------------------------------------------------------------------------------------------------  RADIOLOGY:  Dg Hip Operative Unilat W Or W/o Pelvis Left  Result Date: 04/05/2019 CLINICAL DATA:  73 year old female undergoing repair of left femoral neck fracture. EXAM: OPERATIVE LEFT HIP (WITH PELVIS IF PERFORMED) 2 VIEWS TECHNIQUE: Fluoroscopic spot image(s) were submitted for interpretation post-operatively. COMPARISON:  Left hip series 04/04/2019. FINDINGS: The initial image demonstrates improved alignment about the left femoral neck fracture. On the 2nd image of left hip bipolar arthroplasty is in place. AP alignment appears normal. FLUOROSCOPY TIME:  0 minutes 6 seconds IMPRESSION: Left hip arthroplasty with no adverse features identified. Electronically Signed   By: Genevie Ann M.D.   On: 04/05/2019 14:57   Dg Hip Unilat W Or W/o Pelvis 2-3 Views Left  Result Date: 04/05/2019 CLINICAL DATA:  Left hip replacement. EXAM: DG HIP (WITH OR WITHOUT PELVIS) 2-3V LEFT COMPARISON:  Left hip x-rays from yesterday. FINDINGS: The left hip demonstrates a total arthroplasty without evidence of hardware failure or complication. There is expected intra-articular air. There is no fracture or dislocation. The alignment is anatomic. Post-surgical changes noted in the surrounding soft tissues. Surgical drain in place.  IMPRESSION: 1. Interval left total hip arthroplasty without acute postoperative complication. Electronically Signed   By: Huntley Dec  Derry M.D.   On: 04/05/2019 15:42   EKG:   Orders placed or performed during the hospital encounter of 04/04/19  . EKG 12-Lead  . EKG 12-Lead  . ED EKG  . ED EKG    ASSESSMENT AND PLAN:  73 y.o. female with pertinent past medical history of hypertension, CVA and hyperlipidemia presenting to the ED with complaints of left hip pain status post mechanical fall.  1. Left femoral neck fracture -secondary to mechanical fall -POD # 2  - PRN. pain management - IV fluids hydration -Follow-up with orthopedics  2.  Leukocytosis: WBC 15.4->12.7 likely reactive in the setting of stress from mechanical fall - nml UA  - CXR shows no active cardiopulmonary process  3. Acute on chronic kidney injury - Hold nephrotoxins - IV fluids hydration - Continue to monitor renal function  4. Hypertension - Hold lisinopril in the setting of AKI - Continue low-dose hydrochlorothiazide as creatinine slightly improved from previous - PRN labetalol  5. History of CVA - Resume aspirin and Plavix held for the procedure  6. HLD  - Atorvastatin 40mg  PO qhs  7. DVT prophylaxis -Lovenox subcu   PT is recommending home health PT. patient prefers to go home  All the records are reviewed and case discussed with Care Management/Social Workerr. Management plans discussed with the patient, nursing and they are in agreement.  CODE STATUS: Full code  TOTAL TIME TAKING CARE OF THIS PATIENT: 35  minutes.   POSSIBLE D/C IN  1-2 DAYS, DEPENDING ON CLINICAL CONDITION.  Note: This dictation was prepared with Dragon dictation along with smaller phrase technology. Any transcriptional errors that result from this process are unintentional.   Max Sane M.D on 04/07/2019 at 11:20 AM  Between 7am to 6pm - Pager - [phone] After 6pm go to [email or web address] - password [email or web address]  [facility]  [facility]  Office  [phone]  CC: Primary care physician; [place] Pink, MD

## 2019-04-08 LAB — BASIC METABOLIC PANEL
Anion gap: 8 (ref 5–15)
BUN: 17 mg/dL (ref 8–23)
CO2: 26 mmol/L (ref 22–32)
Calcium: 8.5 mg/dL — ABNORMAL LOW (ref 8.9–10.3)
Chloride: 101 mmol/L (ref 98–111)
Creatinine, Ser: 1.25 mg/dL — ABNORMAL HIGH (ref 0.44–1.00)
GFR calc Af Amer: 49 mL/min — ABNORMAL LOW (ref >60)
GFR calc non Af Amer: 43 mL/min — ABNORMAL LOW (ref >60)
Glucose, Bld: 137 mg/dL — ABNORMAL HIGH (ref 70–99)
Potassium: 3.7 mmol/L (ref 3.5–5.1)
Sodium: 135 mmol/L (ref 135–145)

## 2019-04-08 LAB — CBC
HCT: 32.8 % — ABNORMAL LOW (ref 36.0–46.0)
Hemoglobin: 10.9 g/dL — ABNORMAL LOW (ref 12.0–15.0)
MCH: 34.3 pg — ABNORMAL HIGH (ref 26.0–34.0)
MCHC: 33.2 g/dL (ref 30.0–36.0)
MCV: 103.1 fL — ABNORMAL HIGH (ref 80.0–100.0)
Platelets: 179 10*3/uL (ref 150–400)
RBC: 3.18 MIL/uL — ABNORMAL LOW (ref 3.87–5.11)
RDW: 14.7 % (ref 11.5–15.5)
WBC: 11.1 10*3/uL — ABNORMAL HIGH (ref 4.0–10.5)
nRBC: 0 % (ref 0.0–0.2)

## 2019-04-08 NOTE — Progress Notes (Signed)
  Subjective: 3 Days Post-Op Procedure(s) (LRB): TOTAL HIP ARTHROPLASTY ANTERIOR APPROACH (Left) Patient reports pain as Well-controlled.   Patient is well, and has had no acute complaints or problems Plan is to go Home after hospital stay. Negative for chest pain and shortness of breath Fever: no Gastrointestinal: Negative for nausea and vomiting. Patient has had a bowel movement.  Objective: Vital signs in last 24 hours: Temp:  [98.4 F (36.9 C)-98.8 F (37.1 C)] 98.7 F (37.1 C) (10/11 0707) Pulse Rate:  [64-76] 64 (10/11 0707) Resp:  [16-19] 18 (10/11 0707) BP: (122-168)/(69-80) 122/76 (10/11 0707) SpO2:  [94 %-99 %] 99 % (10/11 0707)  Intake/Output from previous day:  Intake/Output Summary (Last 24 hours) at 04/08/2019 1052 Last data filed at 04/08/2019 0900 Gross per 24 hour  Intake 240 ml  Output -  Net 240 ml    Intake/Output this shift: Total I/O In: 240 [P.O.:240] Out: -   Labs: Recent Labs    04/07/19 0513 04/08/19 0410  HGB 11.0* 10.9*   Recent Labs    04/07/19 0513 04/08/19 0410  WBC 12.7* 11.1*  RBC 3.23* 3.18*  HCT 33.3* 32.8*  PLT 187 179   Recent Labs    04/06/19 0433 04/08/19 0410  NA 136 135  K 4.2 3.7  CL 107 101  CO2 24 26  BUN 16 17  CREATININE 1.10* 1.25*  GLUCOSE 140* 137*  CALCIUM 8.5* 8.5*   No results for input(s): LABPT, INR in the last 72 hours.   EXAM General - Patient is Alert, Appropriate and Oriented Extremity - Neurovascular intact Dorsiflexion/Plantar flexion intact Dressing/Incision -wound VAC in place. Motor Function - intact, moving foot and toes well on exam.    Assessment/Plan: 3 Days Post-Op Procedure(s) (LRB): TOTAL HIP ARTHROPLASTY ANTERIOR APPROACH (Left) Active Problems:   Displaced fracture of left femoral neck (HCC)  Estimated body mass index is 28 kg/m as calculated from the following:   Height as of this encounter: 5\' 6"  (1.676 m).   Weight as of this encounter: 78.7 kg. Advance  diet Up with therapy  DVT Prophylaxis - Lovenox, SCDs, TED hose Weight-Bearing as tolerated to left leg  Cassell Smiles, PA-C [facility] 04/08/2019, 10:52 AM

## 2019-04-08 NOTE — Progress Notes (Signed)
Physical Therapy Treatment Patient Details Name: Victoria Gutierrez MRN: [id number] DOB: [birth year] Today's Date: 04/08/2019    History of Present Illness Pt is a 73 y/o female s/p L THA (10/8) after suffering a fall at home, with PMH including HTN, CVA (12/2018), hyperlipidemia and CKI.    PT Comments    Patient is able to perform LE exercises and is MI with bed mobility supine to sit and sit to supine. She has good seated balance and fair standing balance with RW.  She performs gait with RW ,decreased gait speed , and MI for 200 feet. She reports that she is having 4/10 pain and that her arms are getting fatigued with the ambulation. She tolerates therapy well and will continue to benefit from skilled PT to Improve mobility and strength.   Follow Up Recommendations  Home health PT     Equipment Recommendations  (RW)    Recommendations for Other Services       Precautions / Restrictions Restrictions Weight Bearing Restrictions: Yes LLE Weight Bearing: Weight bearing as tolerated    Mobility  Bed Mobility Overal bed mobility: Modified Independent Bed Mobility: Supine to Sit;Sit to Supine     Supine to sit: Modified independent (Device/Increase time) Sit to supine: Modified independent (Device/Increase time)   General bed mobility comments: needs VC for  sequencing  Transfers Overall transfer level: Modified independent Equipment used: Rolling walker (2 wheeled)   Sit to Stand: Modified independent (Device/Increase time)         General transfer comment: needs RW and safety cues  Ambulation/Gait Ambulation/Gait assistance: Modified independent (Device/Increase time) Gait Distance (Feet): 200 Feet Assistive device: Rolling walker (2 wheeled) Gait Pattern/deviations: Step-to pattern     General Gait Details: (decreased gait speed)   Stairs             Wheelchair Mobility    Modified Rankin (Stroke Patients Only)       Balance Overall balance  assessment: Modified Independent Sitting-balance support: Single extremity supported Sitting balance-Leahy Scale: Normal     Standing balance support: Bilateral upper extremity supported Standing balance-Leahy Scale: Good                              Cognition Arousal/Alertness: Awake/alert Behavior During Therapy: WFL for tasks assessed/performed Overall Cognitive Status: Within Functional Limits for tasks assessed                                        Exercises Total Joint Exercises Ankle Circles/Pumps: AROM;Right;Left;10 reps Quad Sets: Strengthening;Right;Left;10 reps    General Comments        Pertinent Vitals/Pain Pain Assessment: 0-10 Pain Score: 3  Pain Descriptors / Indicators: Aching Pain Intervention(s): Limited activity within patient's tolerance    Home Living                      Prior Function            PT Goals (current goals can now be found in the care plan section) Acute Rehab PT Goals Patient Stated Goal: (to go home) PT Goal Formulation: With patient Time For Goal Achievement: 04/20/19 Potential to Achieve Goals: Good Progress towards PT goals: Progressing toward goals    Frequency    BID      PT Plan Current plan remains appropriate  Co-evaluation              AM-PAC PT "6 Clicks" Mobility   Outcome Measure  Help needed turning from your back to your side while in a flat bed without using bedrails?: A Little Help needed moving from lying on your back to sitting on the side of a flat bed without using bedrails?: A Little Help needed moving to and from a bed to a chair (including a wheelchair)?: A Little Help needed standing up from a chair using your arms (e.g., wheelchair or bedside chair)?: A Little Help needed to walk in hospital room?: A Little Help needed climbing 3-5 steps with a railing? : A Little 6 Click Score: 18    End of Session Equipment Utilized During Treatment: Gait  belt Activity Tolerance: Patient tolerated treatment well;Patient limited by fatigue Patient left: in bed;with bed alarm set   PT Visit Diagnosis: Muscle weakness (generalized) (M62.81);Difficulty in walking, not elsewhere classified (R26.2)     Time: [id number] PT Time Calculation (min) (ACUTE ONLY): 30 min  Charges:  $Gait Training: 8-22 mins $Therapeutic Activity: 8-22 mins                        Alanson Puls, PT DPT 04/08/2019, 12:25 PM

## 2019-04-08 NOTE — Progress Notes (Signed)
[place] [facility] [place] [facility] [place] NAME: Victoria Gutierrez    MR#:  [id number]  DATE OF BIRTH:  22-Jan-1946  SUBJECTIVE:  left leg pain improving slowly. s/p hip surgery on 04/05/2019. No new c/o REVIEW OF SYSTEMS:  CONSTITUTIONAL: No fever, fatigue or weakness.  EYES: No blurred or double vision.  EARS, NOSE, AND THROAT: No tinnitus or ear pain.  RESPIRATORY: No cough, shortness of breath, wheezing or hemoptysis.  CARDIOVASCULAR: No chest pain, orthopnea, edema.  GASTROINTESTINAL: No nausea, vomiting, diarrhea or abdominal pain.  GENITOURINARY: No dysuria, hematuria.  ENDOCRINE: No polyuria, nocturia,  HEMATOLOGY: No anemia, easy bruising or bleeding SKIN: No rash or lesion. MUSCULOSKELETAL: Left leg pain NEUROLOGIC: No tingling, numbness, weakness.  PSYCHIATRY: No anxiety or depression.   DRUG ALLERGIES:  No Known Allergies  VITALS:  Blood pressure 122/76, pulse 64, temperature 98.7 F (37.1 C), temperature source Oral, resp. rate 18, height 5\' 6"  (1.676 m), weight 78.7 kg, SpO2 99 %.  PHYSICAL EXAMINATION:  GENERAL:  73 y.o.-year-old patient lying in the bed with no acute distress.  EYES: Pupils equal, round, reactive to light and accommodation. No scleral icterus. Extraocular muscles intact.  HEENT: Head atraumatic, normocephalic. Oropharynx and nasopharynx clear.  NECK:  Supple, no jugular venous distention. No thyroid enlargement, no tenderness.  LUNGS: Normal breath sounds bilaterally, no wheezing, rales,rhonchi or crepitation. No use of accessory muscles of respiration.  CARDIOVASCULAR: S1, S2 normal. No murmurs, rubs, or gallops.  ABDOMEN: Soft, nontender, nondistended. Bowel sounds present.  EXTREMITIES: Left leg is tender with clean dressing no pedal edema, cyanosis, or clubbing.  NEUROLOGIC: Cranial nerves II through XII are intact. Gait not checked.  PSYCHIATRIC: The patient is alert and oriented x 3.  SKIN: No obvious rash, lesion,  or ulcer.    LABORATORY PANEL:   CBC Recent Labs  Lab 04/08/19 0410  WBC 11.1*  HGB 10.9*  HCT 32.8*  PLT 179   ------------------------------------------------------------------------------------------------------------------  Chemistries  Recent Labs  Lab 04/08/19 0410  NA 135  K 3.7  CL 101  CO2 26  GLUCOSE 137*  BUN 17  CREATININE 1.25*  CALCIUM 8.5*   ------------------------------------------------------------------------------------------------------------------  Cardiac Enzymes No results for input(s): TROPONINI in the last 168 hours. ------------------------------------------------------------------------------------------------------------------  RADIOLOGY:  No results found. EKG:   Orders placed or performed during the hospital encounter of 04/04/19  . EKG 12-Lead  . EKG 12-Lead  . ED EKG  . ED EKG    ASSESSMENT AND PLAN:  73 y.o. female with pertinent past medical history of hypertension, CVA and hyperlipidemia presenting to the ED with complaints of left hip pain status post mechanical fall.  1. Left femoral neck fracture -secondary to mechanical fall -POD # 3  - PRN. pain management - IV fluids hydration -Follow-up with orthopedics  2.  Leukocytosis: WBC 15.4->11.1 likely reactive in the setting of stress from mechanical fall - nml UA  - CXR shows no active cardiopulmonary process  3. Acute on chronic kidney injury - Hold nephrotoxins - IV fluids hydration - Continue to monitor renal function  4. Hypertension - Hold lisinopril in the setting of AKI - Continue low-dose hydrochlorothiazide as creatinine slightly improved from previous - PRN labetalol  5. History of CVA - Resume aspirin and Plavix held for the procedure  6. HLD  - Atorvastatin 40mg  PO qhs  7. DVT prophylaxis -Lovenox subcu   PT is recommending home health PT. patient prefers to go home as well   All  the records are reviewed and case discussed with Care  Management/Social Workerr. Management plans discussed with the patient, nursing and they are in agreement.  CODE STATUS: Full code  TOTAL TIME TAKING CARE OF THIS PATIENT: 35  minutes.   POSSIBLE D/C IN  1 DAYS, DEPENDING ON CLINICAL CONDITION.  Note: This dictation was prepared with Dragon dictation along with smaller phrase technology. Any transcriptional errors that result from this process are unintentional.   Max Sane M.D on 04/08/2019 at 9:22 AM  Between 7am to 6pm - Pager - [phone] After 6pm go to [email or web address] - password [email or web address] [facility]  [facility]  Office  [phone]  CC: Primary care physician; [place] Pink, MD

## 2019-04-09 LAB — BASIC METABOLIC PANEL
Anion gap: 7 (ref 5–15)
BUN: 16 mg/dL (ref 8–23)
CO2: 28 mmol/L (ref 22–32)
Calcium: 8.6 mg/dL — ABNORMAL LOW (ref 8.9–10.3)
Chloride: 98 mmol/L (ref 98–111)
Creatinine, Ser: 1.21 mg/dL — ABNORMAL HIGH (ref 0.44–1.00)
GFR calc Af Amer: 51 mL/min — ABNORMAL LOW (ref >60)
GFR calc non Af Amer: 44 mL/min — ABNORMAL LOW (ref >60)
Glucose, Bld: 136 mg/dL — ABNORMAL HIGH (ref 70–99)
Potassium: 3.5 mmol/L (ref 3.5–5.1)
Sodium: 133 mmol/L — ABNORMAL LOW (ref 135–145)

## 2019-04-09 LAB — CBC
HCT: 33.7 % — ABNORMAL LOW (ref 36.0–46.0)
Hemoglobin: 11.3 g/dL — ABNORMAL LOW (ref 12.0–15.0)
MCH: 33.6 pg (ref 26.0–34.0)
MCHC: 33.5 g/dL (ref 30.0–36.0)
MCV: 100.3 fL — ABNORMAL HIGH (ref 80.0–100.0)
Platelets: 198 10*3/uL (ref 150–400)
RBC: 3.36 MIL/uL — ABNORMAL LOW (ref 3.87–5.11)
RDW: 14.2 % (ref 11.5–15.5)
WBC: 9.3 10*3/uL (ref 4.0–10.5)
nRBC: 0 % (ref 0.0–0.2)

## 2019-04-09 LAB — SURGICAL PATHOLOGY

## 2019-04-09 MED ORDER — IBUPROFEN 400 MG PO TABS: 400.0000 mg | ORAL_TABLET | Freq: Four times a day (QID) | ORAL | 0 refills | Status: DC | PRN

## 2019-04-09 NOTE — Discharge Instructions (Signed)
Hip Fracture  A hip fracture is a break in the upper part of the thigh bone (femur). This is usually the result of an injury, commonly a fall. What are the causes? This condition may be caused by:  A direct hit or injury (trauma) to the side of the hip, such as from a fall or a car accident. What increases the risk? You are more likely to develop this condition if:  You have poor balance or an unsteady walking pattern (gait). Certain conditions contribute to poor balance, including Parkinson disease and dementia.  You have thinning or weakening of your bones, such as from osteopenia or osteoporosis.  You have cancer that spreads to the leg bones.  You have certain conditions that can weaken your bones, such as thyroid disorders, intestine disorders, or a lack (deficiency) of certain nutrients.  You smoke.  You take certain medicines, such as steroids.  You have a history of broken bones. What are the signs or symptoms? Symptoms of this condition include:  Pain over the injured hip. This is commonly felt on the side of the hip or in the front groin area.  Stiffness, bruising, and swelling over the hip.  Pain with movement of the leg, especially lifting it up. Pain often gets better with rest.  Difficulty or inability to stand, walk, or use the leg to support body weight (put weight on the leg).  The leg rolling outward when lying down.  The affected leg being shorter than the other leg. How is this diagnosed? This condition may be diagnosed based on:  Your symptoms.  A physical exam.  X-rays. These may be done: ? To confirm the diagnosis. ? To determine the type and location of the fracture. ? To check for other injuries.  MRI or CT scans. These may be done if the fracture is not visible on an X-ray. How is this treated? Treatment for this condition depends on the severity and location of your fracture. In most cases, surgery is necessary. Surgery may  involve:  Repairing the fracture with a screw, nail, or rod to hold the bone in place (open reduction and internal fixation, ORIF).  Replacing the damaged parts of the femur with metal implants (hemiarthroplasty or arthroplasty). If your fracture is less severe, or if you are not eligible for surgery, you may have non-surgical treatment. Non-surgical treatment may involve:  Using crutches, a walker, or a wheelchair until your health care provider says that you can support (bear) weight on your hip.  Medicines to help reduce pain and swelling.  Having regular X-rays to monitor your fracture and make sure that it is healing.  Physical therapy. You may need physical therapy after surgery, too. Follow these instructions at home: Activity  Do not use your injured leg to support your body weight until your health care provider says that you can. ? Follow standing and walking restrictions as told by your health care provider. ? Use crutches, a walker, or a wheelchair as directed.  Avoid any activities that cause pain or irritation in your hip. Ask your health care provider what activities are safe for you.  Do not drive or use heavy machinery until your health care provider approves.  If physical therapy was prescribed, do exercises as told by your health care provider. General instructions  Take over-the-counter and prescription medicines only as told by your health care provider.  If directed, put ice on the injured area: ? Put ice in a plastic bag. ?  Place a towel between your skin and the bag. °? Leave the ice on for 20 minutes, 2-3 times a day. °· Do not use any products that contain nicotine or tobacco, such as cigarettes and e-cigarettes. These can delay bone healing. If you need help quitting, ask your health care provider. °· Keep all follow-up visits as told by your health care provider. This is important. °How is this prevented? °· To prevent falls at home: °? Use a cane, walker,  or wheelchair as directed. °? Make sure your rooms and hallways are free of clutter, obstacles, and cords. °? Install grab bars in your bedroom and bathrooms. °? Always use handrails when going up and down stairs. °? Use nightlights around the house. °· Exercise regularly. Ask what forms of exercise are safe for you, such as walking and strength and balance exercises. °· Visit an eye doctor regularly to have your eyesight checked. This can help prevent falls. °· Make sure you get enough calcium and vitamin D. °· Do not use any products that contain nicotine or tobacco, such as cigarettes and e-cigarettes. If you need help quitting, ask your health care provider. °· Limit alcohol use. °· If you have an underlying condition that caused your hip fracture, work with your health care provider to manage your condition. °Contact a health care provider if: °· Your pain gets worse or it does not get better with rest or medicine. °· You develop any of the following in your leg or foot: °? Numbness. °? Tingling. °? A change in skin color (discoloration). °? Skin feeling cold to the touch. °Get help right away if: °· Your pain suddenly gets worse. °· You cannot move your hip. °Summary °· A hip fracture is a break in the upper part of the thigh bone (femur). °· Treatment typically require surgical management to restore stability and function to the hip. °· Pain medicine and icing of the affected leg can help manage pain and swelling. Follow directions as told by your health care provider. °This information is not intended to replace advice given to you by your health care provider. Make sure you discuss any questions you have with your health care provider. °Document Released: 06/14/2005 Document Revised: 03/04/2018 Document Reviewed: 07/17/2016 °Elsevier Patient Education © 2020 [employer] ° °

## 2019-04-09 NOTE — Progress Notes (Signed)
D: Pt alert and oriented. Pt denies experiencing any pain at this time.    A: Pt received discharge and medication education/information. Pt took all belongings with them.  R: Pt verbalized understanding of discharge and medication education/information.  Pt escorted via wheelchair by staff to front lobby where family picked pt up.

## 2019-04-09 NOTE — TOC Transition Note (Signed)
Transition of Care [facility]) - CM/SW Discharge Note   Patient Details  Name: Victoria Gutierrez MRN: [id number] Date of Birth: 16-Jan-1946  Transition of Care [facility]) CM/SW Contact:  Ranessa Kosta, Lenice Llamas Phone Number: [phone]  04/09/2019, 9:05 AM   Clinical Narrative: Clinical Social Worker (CSW) notified [name] home health representative that patient will D/C home today. CSW made [name] aware that MD ordered home health PT, OT, RN and aide. Patient was open to [facility] home health for PT prior to hospitalization. Per patient she has a rolling walker and a bedside commode at home. Per ortho surgeon patient will not be on Lovenox. Please reconsult if future social work needs arise. CSW signing off.     Final next level of care: [place] Barriers to Discharge: Barriers Resolved   Patient Goals and CMS Choice Patient states their goals for this hospitalization and ongoing recovery are:: To go home.      Discharge Placement                       Discharge Plan and Services In-house Referral: Clinical Social Work              DME Arranged: N/A         HH Arranged: RN, PT, OT, Nurse's Aide [place] Agency: [facility] Date [name]: 04/09/19 Time [place]: [phone] Representative spoke with at [name]: [place] ([place]) Interventions     Readmission Risk Interventions No flowsheet data found.

## 2019-04-09 NOTE — Care Management Important Message (Signed)
Important Message  Patient Details  Name: Victoria Gutierrez MRN: [id number] Date of Birth: 1946/02/09   Medicare Important Message Given:  Yes     Dannette Barbara 04/09/2019, 10:31 AM

## 2019-04-09 NOTE — Progress Notes (Signed)
Physical Therapy Treatment Patient Details Name: Victoria Gutierrez MRN: [id number] DOB: [birth year] Today's Date: 04/09/2019    History of Present Illness Pt is a 73 y/o female s/p L THA (10/8) after suffering a fall at home, with PMH including HTN, CVA (12/2018), hyperlipidemia and CKI.    PT Comments    Pt agreeable to PT; reports 6/10 pain in L hip with movement/mobility. Pt demonstrates bed mobility and STS with mod I with cues. Ambulation 100 ft with Min guard/supervision and very slow pace. Pt negotiates up/down 4 steps with Min assist and B rails (pt does not have rails at home for 2 small rise steps, but will have 2 person assist). Pt received up in chair comfortably. Pt prepared to discharge home to continue therapy with home health PT.    Follow Up Recommendations  Home health PT     Equipment Recommendations  Rolling walker with 5" wheels;3in1 (PT)    Recommendations for Other Services       Precautions / Restrictions Precautions Precautions: Anterior Hip;Fall Restrictions Weight Bearing Restrictions: Yes LLE Weight Bearing: Weight bearing as tolerated Other Position/Activity Restrictions: anterior hip precautions    Mobility  Bed Mobility Overal bed mobility: Modified Independent Bed Mobility: Supine to Sit     Supine to sit: Modified independent (Device/Increase time)     General bed mobility comments: Mild increased time  Transfers Overall transfer level: Modified independent Equipment used: Rolling walker (2 wheeled) Transfers: Sit to/from Stand Sit to Stand: Modified independent (Device/Increase time);From elevated surface(pt has adjustable bed at home)         General transfer comment: good use of hands  Ambulation/Gait Ambulation/Gait assistance: Min guard;Supervision Gait Distance (Feet): 100 Feet Assistive device: Rolling walker (2 wheeled) Gait Pattern/deviations: Step-through pattern(partial step through) Gait velocity: decreased Gait  velocity interpretation: <1.31 ft/sec, indicative of household ambulator General Gait Details: slow pace   Stairs Stairs: Yes Stairs assistance: Min assist Stair Management: Two rails;Forwards;Step to pattern Number of Stairs: 4 General stair comments: Pt does not have rails, but will have 2 assist at home. Manages well without moderate reliance on rails   Wheelchair Mobility    Modified Rankin (Stroke Patients Only)       Balance Overall balance assessment: Mild deficits observed, not formally tested                                          Cognition Arousal/Alertness: Awake/alert Behavior During Therapy: WFL for tasks assessed/performed Overall Cognitive Status: Within Functional Limits for tasks assessed                                 General Comments: HOH      Exercises      General Comments        Pertinent Vitals/Pain Pain Assessment: 0-10 Pain Score: 6 (with movement) Pain Location: L hip/LE Pain Descriptors / Indicators: Aching(increases with movement) Pain Intervention(s): Monitored during session;Repositioned    Home Living                      Prior Function            PT Goals (current goals can now be found in the care plan section) Progress towards PT goals: Progressing toward goals    Frequency    BID  PT Plan Current plan remains appropriate    Co-evaluation              AM-PAC PT "6 Clicks" Mobility   Outcome Measure  Help needed turning from your back to your side while in a flat bed without using bedrails?: A Little Help needed moving from lying on your back to sitting on the side of a flat bed without using bedrails?: A Little Help needed moving to and from a bed to a chair (including a wheelchair)?: A Little Help needed standing up from a chair using your arms (e.g., wheelchair or bedside chair)?: None Help needed to walk in hospital room?: A Little Help needed climbing  3-5 steps with a railing? : A Little 6 Click Score: 19    End of Session Equipment Utilized During Treatment: Gait belt Activity Tolerance: Patient tolerated treatment well Patient left: in chair;with call bell/phone within reach;with chair alarm set   PT Visit Diagnosis: Muscle weakness (generalized) (M62.81);Difficulty in walking, not elsewhere classified (R26.2)     Time: [id number] PT Time Calculation (min) (ACUTE ONLY): 26 min  Charges:  $Gait Training: 23-37 mins                      Larae Grooms, PTA 04/09/2019, 12:30 PM

## 2019-04-09 NOTE — Progress Notes (Signed)
OT Cancellation Note  Patient Details Name: Victoria Gutierrez MRN: [id number] DOB: [birth year]   Cancelled Treatment:    Reason Eval/Treat Not Completed: Patient declined, no reason specified. Upon attempt, pt dressed, up in recliner and expressing frustration over waiting for discharge instructions. Per pt's request, attempted to find RN to check on paperwork. RN not available and pt updated. Pt stated, "I'll try to be patient." Pt politely declined additional OT needs. Just eager to discharge. Will re-attempt OT tx as appropriate.   Jeni Salles, MPH, MS, OTR/L ascom [phone] 04/09/19, 11:27 AM

## 2019-04-09 NOTE — Progress Notes (Signed)
  Subjective: 4 Days Post-Op Procedure(s) (LRB): TOTAL HIP ARTHROPLASTY ANTERIOR APPROACH (Left) Patient reports pain as mild. Patient is well, and has had no acute complaints or problems Plan is to go Home after hospital stay. Negative for chest pain and shortness of breath Fever: no Gastrointestinal: Negative for nausea and vomiting. Patient has had a bowel movement.  Objective: Vital signs in last 24 hours: Temp:  [98.3 F (36.8 C)-99.2 F (37.3 C)] 99.2 F (37.3 C) (10/11 2339) Pulse Rate:  [64-93] 75 (10/11 2339) Resp:  [17-19] 19 (10/11 2339) BP: (155-174)/(78-89) 155/81 (10/11 2339) SpO2:  [98 %-99 %] 98 % (10/11 2339)  Intake/Output from previous day:  Intake/Output Summary (Last 24 hours) at 04/09/2019 0756 Last data filed at 04/09/2019 0026 Gross per 24 hour  Intake 720 ml  Output 50 ml  Net 670 ml    Intake/Output this shift: No intake/output data recorded.  Labs: Recent Labs    04/07/19 0513 04/08/19 0410 04/09/19 0432  HGB 11.0* 10.9* 11.3*   Recent Labs    04/08/19 0410 04/09/19 0432  WBC 11.1* 9.3  RBC 3.18* 3.36*  HCT 32.8* 33.7*  PLT 179 198   Recent Labs    04/08/19 0410 04/09/19 0432  NA 135 133*  K 3.7 3.5  CL 101 98  CO2 26 28  BUN 17 16  CREATININE 1.25* 1.21*  GLUCOSE 137* 136*  CALCIUM 8.5* 8.6*   No results for input(s): LABPT, INR in the last 72 hours.   EXAM General - Patient is Alert, Appropriate and Oriented Extremity - Neurovascular intact Dorsiflexion/Plantar flexion intact Dressing/Incision -wound VAC in place. No drainage Motor Function - intact, moving foot and toes well on exam.    Assessment/Plan: 4 Days Post-Op Procedure(s) (LRB): TOTAL HIP ARTHROPLASTY ANTERIOR APPROACH (Left) Active Problems:   Displaced fracture of left femoral neck (HCC)  Estimated body mass index is 28 kg/m as calculated from the following:   Height as of this encounter: 5\' 6"  (1.676 m).   Weight as of this encounter: 78.7  kg. Advance diet Up with therapy  Discharge home with HHPT Follow up with [place] [facility] in 2 weeks for dressing change Please remove provena negative pressure dressing on 04/16/2019 and apply honey comb/ dressing. Keep dressing clean and dry at all times.   DVT Prophylaxis -aspirin, Plavix, SCDs, TED hose Weight-Bearing as tolerated to left leg  T. Rachelle Hora, PA-C [facility] 04/09/2019, 7:56 AM

## 2019-04-09 NOTE — Progress Notes (Signed)
Wound vac changed to prevena per order 

## 2019-04-10 DIAGNOSIS — F329 Major depressive disorder, single episode, unspecified: Secondary | ICD-10-CM | POA: Diagnosis not present

## 2019-04-10 DIAGNOSIS — I129 Hypertensive chronic kidney disease with stage 1 through stage 4 chronic kidney disease, or unspecified chronic kidney disease: Secondary | ICD-10-CM | POA: Diagnosis not present

## 2019-04-10 DIAGNOSIS — N189 Chronic kidney disease, unspecified: Secondary | ICD-10-CM | POA: Diagnosis not present

## 2019-04-10 DIAGNOSIS — S72052D Unspecified fracture of head of left femur, subsequent encounter for closed fracture with routine healing: Secondary | ICD-10-CM | POA: Diagnosis not present

## 2019-04-10 DIAGNOSIS — J449 Chronic obstructive pulmonary disease, unspecified: Secondary | ICD-10-CM | POA: Diagnosis not present

## 2019-04-10 DIAGNOSIS — S72042A Displaced fracture of base of neck of left femur, initial encounter for closed fracture: Secondary | ICD-10-CM | POA: Diagnosis not present

## 2019-04-10 DIAGNOSIS — M19042 Primary osteoarthritis, left hand: Secondary | ICD-10-CM | POA: Diagnosis not present

## 2019-04-10 DIAGNOSIS — M19041 Primary osteoarthritis, right hand: Secondary | ICD-10-CM | POA: Diagnosis not present

## 2019-04-10 DIAGNOSIS — I69351 Hemiplegia and hemiparesis following cerebral infarction affecting right dominant side: Secondary | ICD-10-CM | POA: Diagnosis not present

## 2019-04-12 DIAGNOSIS — F329 Major depressive disorder, single episode, unspecified: Secondary | ICD-10-CM | POA: Diagnosis not present

## 2019-04-12 DIAGNOSIS — N189 Chronic kidney disease, unspecified: Secondary | ICD-10-CM | POA: Diagnosis not present

## 2019-04-12 DIAGNOSIS — I129 Hypertensive chronic kidney disease with stage 1 through stage 4 chronic kidney disease, or unspecified chronic kidney disease: Secondary | ICD-10-CM | POA: Diagnosis not present

## 2019-04-12 DIAGNOSIS — S72042A Displaced fracture of base of neck of left femur, initial encounter for closed fracture: Secondary | ICD-10-CM | POA: Diagnosis not present

## 2019-04-12 DIAGNOSIS — I69351 Hemiplegia and hemiparesis following cerebral infarction affecting right dominant side: Secondary | ICD-10-CM | POA: Diagnosis not present

## 2019-04-12 DIAGNOSIS — S72052D Unspecified fracture of head of left femur, subsequent encounter for closed fracture with routine healing: Secondary | ICD-10-CM | POA: Diagnosis not present

## 2019-04-12 DIAGNOSIS — M19041 Primary osteoarthritis, right hand: Secondary | ICD-10-CM | POA: Diagnosis not present

## 2019-04-12 DIAGNOSIS — J449 Chronic obstructive pulmonary disease, unspecified: Secondary | ICD-10-CM | POA: Diagnosis not present

## 2019-04-12 DIAGNOSIS — M19042 Primary osteoarthritis, left hand: Secondary | ICD-10-CM | POA: Diagnosis not present

## 2019-04-12 NOTE — Discharge Summary (Signed)
[facility] NAME: Victoria Gutierrez    MR#:  [id number]  DATE OF BIRTH:  24-Mar-1946  DATE OF ADMISSION:  04/04/2019   ADMITTING PHYSICIAN: Lang Snow, NP  DATE OF DISCHARGE: 04/09/2019 12:30 PM  PRIMARY CARE PHYSICIAN: Maryland Pink, MD   ADMISSION DIAGNOSIS:  Surgery, elective [Z41.9] Hip pain [M25.559] Closed fracture of left hip, initial encounter ([place]) [S72.002A] DISCHARGE DIAGNOSIS:  Active Problems:   Displaced fracture of left femoral neck ([place])  SECONDARY DIAGNOSIS:   Past Medical History:  Diagnosis Date  . Hypertension   . Stroke [facility])    HOSPITAL COURSE:   73 y.o.femalewith pertinent past medical history ofhypertension, CVA and hyperlipidemia presenting to the ED with complaints of left hip pain status post mechanical fall.  1.Left femoral neck fracture-secondary to mechanical fall -POD # 3 - prefers to go home with HHPT. Good progress postop so far. Ortho and PT in agreement.  2.Leukocytosis:WBC 15.4->11.1likely reactive in the setting of stress from mechanical fall -nmlUA  - CXRshowsno active cardiopulmonary process  3.Acute on chronic kidney injury 2 -at baseline  4.Hypertension: controlled  5.History of CVA - onaspirin and Plavix   6.HLD  - Atorvastatin40mg  daily  DISCHARGE CONDITIONS:  stable CONSULTS OBTAINED:  Treatment Team:  Hessie Knows, MD DRUG ALLERGIES:  No Known Allergies DISCHARGE MEDICATIONS:   Allergies as of 04/09/2019   No Known Allergies     Medication List    TAKE these medications   aspirin 81 MG EC tablet Take 1 tablet (81 mg total) by mouth daily.   atorvastatin 40 MG tablet Commonly known as: LIPITOR Take 1 tablet (40 mg total) by mouth daily at 6 PM.   citalopram 20 MG tablet Commonly known as: CELEXA Take 20 mg by mouth daily.   clopidogrel 75 MG tablet Commonly known as: PLAVIX Take 1 tablet (75 mg total) by  mouth daily. Take for 90 days   folic acid 1 MG tablet Commonly known as: FOLVITE Take 1 mg by mouth as directed. Take 1 mg on non-methotrexate days   hydrochlorothiazide 12.5 MG capsule Commonly known as: MICROZIDE Take 12.5 mg by mouth daily.   ibuprofen 400 MG tablet Commonly known as: ADVIL Take 1 tablet (400 mg total) by mouth every 6 (six) hours as needed.   lisinopril 40 MG tablet Commonly known as: ZESTRIL Take 40 mg by mouth daily.   Melatonin 5 MG Tbdp Take 5 mg by mouth at bedtime.   methotrexate 2.5 MG tablet Commonly known as: RHEUMATREX Take 12.5 mg by mouth every Sunday.        DISCHARGE INSTRUCTIONS:   DIET:  Regular diet DISCHARGE CONDITION:  Good ACTIVITY:  Activity as tolerated OXYGEN:  Home Oxygen: No.  Oxygen Delivery: room air DISCHARGE LOCATION:  home with HHPT  If you experience worsening of your admission symptoms, develop shortness of breath, life threatening emergency, suicidal or homicidal thoughts you must seek medical attention immediately by calling 911 or calling your MD immediately  if symptoms less severe.  You Must read complete instructions/literature along with all the possible adverse reactions/side effects for all the Medicines you take and that have been prescribed to you. Take any new Medicines after you have completely understood and accpet all the possible adverse reactions/side effects.   Please note  You were cared for by a hospitalist during your hospital stay. If you have any questions about your discharge medications or the care you received while  you were in the hospital after you are discharged, you can call the unit and asked to speak with the hospitalist on call if the hospitalist that took care of you is not available. Once you are discharged, your primary care physician will handle any further medical issues. Please note that NO REFILLS for any discharge medications will be authorized once you are discharged, as it  is imperative that you return to your primary care physician (or establish a relationship with a primary care physician if you do not have one) for your aftercare needs so that they can reassess your need for medications and monitor your lab values.    On the day of Discharge:  VITAL SIGNS:  Blood pressure (!) 150/78, pulse 83, temperature 98.4 F (36.9 C), temperature source Oral, resp. rate 18, height 5\' 6"  (1.676 m), weight 78.7 kg, SpO2 98 %. PHYSICAL EXAMINATION:  GENERAL:  73 y.o.-year-old patient lying in the bed with no acute distress.  EYES: Pupils equal, round, reactive to light and accommodation. No scleral icterus. Extraocular muscles intact.  HEENT: Head atraumatic, normocephalic. Oropharynx and nasopharynx clear.  NECK:  Supple, no jugular venous distention. No thyroid enlargement, no tenderness.  LUNGS: Normal breath sounds bilaterally, no wheezing, rales,rhonchi or crepitation. No use of accessory muscles of respiration.  CARDIOVASCULAR: S1, S2 normal. No murmurs, rubs, or gallops.  ABDOMEN: Soft, non-tender, non-distended. Bowel sounds present. No organomegaly or mass.  EXTREMITIES: No pedal edema, cyanosis, or clubbing.  NEUROLOGIC: Cranial nerves II through XII are intact. Muscle strength 5/5 in all extremities. Sensation intact. Gait not checked.  PSYCHIATRIC: The patient is alert and oriented x 3.  SKIN: No obvious rash, lesion, or ulcer.  DATA REVIEW:   CBC Recent Labs  Lab 04/09/19 0432  WBC 9.3  HGB 11.3*  HCT 33.7*  PLT 198    Chemistries  Recent Labs  Lab 04/09/19 0432  NA 133*  K 3.5  CL 98  CO2 28  GLUCOSE 136*  BUN 16  CREATININE 1.21*  CALCIUM 8.6*     Follow-up Information    Maryland Pink, MD. Schedule an appointment as soon as possible for a visit on 04/16/2019.   Specialty: Family Medicine Why: @ 10:30 am Contact information: [address] [place] [address] [phone]        Duanne Guess,  PA-C. Schedule an appointment as soon as possible for a visit on 04/23/2019.   Specialties: Orthopedic Surgery, Emergency Medicine Why: @ 10:00 am Contact information: [place] [address] [phone]           Management plans discussed with the patient, family and they are in agreement.  CODE STATUS: Prior   TOTAL TIME TAKING CARE OF THIS PATIENT: 45 minutes.    Max Sane M.D on 04/12/2019 at 7:53 PM  Between 7am to 6pm - Pager - [phone]  After 6pm go to [email or web address] - Proofreader  [facility]  Office  [phone]  CC: Primary care physician; Maryland Pink, MD   Note: This dictation was prepared with Dragon dictation along with smaller phrase technology. Any transcriptional errors that result from this process are unintentional.

## 2019-04-13 DIAGNOSIS — S72052D Unspecified fracture of head of left femur, subsequent encounter for closed fracture with routine healing: Secondary | ICD-10-CM | POA: Diagnosis not present

## 2019-04-13 DIAGNOSIS — N189 Chronic kidney disease, unspecified: Secondary | ICD-10-CM | POA: Diagnosis not present

## 2019-04-13 DIAGNOSIS — I129 Hypertensive chronic kidney disease with stage 1 through stage 4 chronic kidney disease, or unspecified chronic kidney disease: Secondary | ICD-10-CM | POA: Diagnosis not present

## 2019-04-13 DIAGNOSIS — M19042 Primary osteoarthritis, left hand: Secondary | ICD-10-CM | POA: Diagnosis not present

## 2019-04-13 DIAGNOSIS — S72042A Displaced fracture of base of neck of left femur, initial encounter for closed fracture: Secondary | ICD-10-CM | POA: Diagnosis not present

## 2019-04-13 DIAGNOSIS — I69351 Hemiplegia and hemiparesis following cerebral infarction affecting right dominant side: Secondary | ICD-10-CM | POA: Diagnosis not present

## 2019-04-13 DIAGNOSIS — J449 Chronic obstructive pulmonary disease, unspecified: Secondary | ICD-10-CM | POA: Diagnosis not present

## 2019-04-13 DIAGNOSIS — M19041 Primary osteoarthritis, right hand: Secondary | ICD-10-CM | POA: Diagnosis not present

## 2019-04-13 DIAGNOSIS — F329 Major depressive disorder, single episode, unspecified: Secondary | ICD-10-CM | POA: Diagnosis not present

## 2019-04-16 DIAGNOSIS — J449 Chronic obstructive pulmonary disease, unspecified: Secondary | ICD-10-CM | POA: Diagnosis not present

## 2019-04-16 DIAGNOSIS — F17210 Nicotine dependence, cigarettes, uncomplicated: Secondary | ICD-10-CM | POA: Diagnosis not present

## 2019-04-16 DIAGNOSIS — Z09 Encounter for follow-up examination after completed treatment for conditions other than malignant neoplasm: Secondary | ICD-10-CM | POA: Diagnosis not present

## 2019-04-16 DIAGNOSIS — Z8781 Personal history of (healed) traumatic fracture: Secondary | ICD-10-CM | POA: Diagnosis not present

## 2019-04-16 DIAGNOSIS — R300 Dysuria: Secondary | ICD-10-CM | POA: Diagnosis not present

## 2019-04-16 DIAGNOSIS — S72052D Unspecified fracture of head of left femur, subsequent encounter for closed fracture with routine healing: Secondary | ICD-10-CM | POA: Diagnosis not present

## 2019-04-16 DIAGNOSIS — I129 Hypertensive chronic kidney disease with stage 1 through stage 4 chronic kidney disease, or unspecified chronic kidney disease: Secondary | ICD-10-CM | POA: Diagnosis not present

## 2019-04-16 DIAGNOSIS — M19041 Primary osteoarthritis, right hand: Secondary | ICD-10-CM | POA: Diagnosis not present

## 2019-04-16 DIAGNOSIS — M25552 Pain in left hip: Secondary | ICD-10-CM | POA: Diagnosis not present

## 2019-04-16 DIAGNOSIS — I69351 Hemiplegia and hemiparesis following cerebral infarction affecting right dominant side: Secondary | ICD-10-CM | POA: Diagnosis not present

## 2019-04-16 DIAGNOSIS — Z96642 Presence of left artificial hip joint: Secondary | ICD-10-CM | POA: Diagnosis not present

## 2019-04-16 DIAGNOSIS — Z9181 History of falling: Secondary | ICD-10-CM | POA: Diagnosis not present

## 2019-04-16 DIAGNOSIS — M19042 Primary osteoarthritis, left hand: Secondary | ICD-10-CM | POA: Diagnosis not present

## 2019-04-16 DIAGNOSIS — S72042A Displaced fracture of base of neck of left femur, initial encounter for closed fracture: Secondary | ICD-10-CM | POA: Diagnosis not present

## 2019-04-16 DIAGNOSIS — N189 Chronic kidney disease, unspecified: Secondary | ICD-10-CM | POA: Diagnosis not present

## 2019-04-16 DIAGNOSIS — R399 Unspecified symptoms and signs involving the genitourinary system: Secondary | ICD-10-CM | POA: Diagnosis not present

## 2019-04-16 DIAGNOSIS — I10 Essential (primary) hypertension: Secondary | ICD-10-CM | POA: Diagnosis not present

## 2019-04-18 DIAGNOSIS — J449 Chronic obstructive pulmonary disease, unspecified: Secondary | ICD-10-CM | POA: Diagnosis not present

## 2019-04-18 DIAGNOSIS — I69351 Hemiplegia and hemiparesis following cerebral infarction affecting right dominant side: Secondary | ICD-10-CM | POA: Diagnosis not present

## 2019-04-18 DIAGNOSIS — N189 Chronic kidney disease, unspecified: Secondary | ICD-10-CM | POA: Diagnosis not present

## 2019-04-18 DIAGNOSIS — M19042 Primary osteoarthritis, left hand: Secondary | ICD-10-CM | POA: Diagnosis not present

## 2019-04-18 DIAGNOSIS — S72052D Unspecified fracture of head of left femur, subsequent encounter for closed fracture with routine healing: Secondary | ICD-10-CM | POA: Diagnosis not present

## 2019-04-18 DIAGNOSIS — S72042A Displaced fracture of base of neck of left femur, initial encounter for closed fracture: Secondary | ICD-10-CM | POA: Diagnosis not present

## 2019-04-18 DIAGNOSIS — I129 Hypertensive chronic kidney disease with stage 1 through stage 4 chronic kidney disease, or unspecified chronic kidney disease: Secondary | ICD-10-CM | POA: Diagnosis not present

## 2019-04-18 DIAGNOSIS — F329 Major depressive disorder, single episode, unspecified: Secondary | ICD-10-CM | POA: Diagnosis not present

## 2019-04-18 DIAGNOSIS — M19041 Primary osteoarthritis, right hand: Secondary | ICD-10-CM | POA: Diagnosis not present

## 2019-04-20 DIAGNOSIS — N189 Chronic kidney disease, unspecified: Secondary | ICD-10-CM | POA: Diagnosis not present

## 2019-04-20 DIAGNOSIS — J449 Chronic obstructive pulmonary disease, unspecified: Secondary | ICD-10-CM | POA: Diagnosis not present

## 2019-04-20 DIAGNOSIS — S72042A Displaced fracture of base of neck of left femur, initial encounter for closed fracture: Secondary | ICD-10-CM | POA: Diagnosis not present

## 2019-04-20 DIAGNOSIS — M19042 Primary osteoarthritis, left hand: Secondary | ICD-10-CM | POA: Diagnosis not present

## 2019-04-20 DIAGNOSIS — I69351 Hemiplegia and hemiparesis following cerebral infarction affecting right dominant side: Secondary | ICD-10-CM | POA: Diagnosis not present

## 2019-04-20 DIAGNOSIS — S72052D Unspecified fracture of head of left femur, subsequent encounter for closed fracture with routine healing: Secondary | ICD-10-CM | POA: Diagnosis not present

## 2019-04-20 DIAGNOSIS — F329 Major depressive disorder, single episode, unspecified: Secondary | ICD-10-CM | POA: Diagnosis not present

## 2019-04-20 DIAGNOSIS — I129 Hypertensive chronic kidney disease with stage 1 through stage 4 chronic kidney disease, or unspecified chronic kidney disease: Secondary | ICD-10-CM | POA: Diagnosis not present

## 2019-04-20 DIAGNOSIS — M19041 Primary osteoarthritis, right hand: Secondary | ICD-10-CM | POA: Diagnosis not present

## 2019-04-23 ENCOUNTER — Telehealth: Payer: Self-pay

## 2019-04-23 NOTE — Telephone Encounter (Signed)
-----   Message from Rosalin Hawking, MD sent at 04/22/2019  6:05 PM EDT ----- Could you please let the patient know that the heart monitoring test done recently was negative for irregular heart beat called afib. Please continue current treatment. Thanks.  Rosalin Hawking, MD PhD Stroke Neurology 04/22/2019 6:05 PM

## 2019-04-23 NOTE — Telephone Encounter (Signed)
Notes recorded by Marval Regal, RN on 04/23/2019 at 9:35 AM EDT  Tried to call pt to give cardiac event monitor results. The mailbox was full unable to leave message.  ------

## 2019-04-25 DIAGNOSIS — M19042 Primary osteoarthritis, left hand: Secondary | ICD-10-CM | POA: Diagnosis not present

## 2019-04-25 DIAGNOSIS — S72052D Unspecified fracture of head of left femur, subsequent encounter for closed fracture with routine healing: Secondary | ICD-10-CM | POA: Diagnosis not present

## 2019-04-25 DIAGNOSIS — M19041 Primary osteoarthritis, right hand: Secondary | ICD-10-CM | POA: Diagnosis not present

## 2019-04-25 DIAGNOSIS — N189 Chronic kidney disease, unspecified: Secondary | ICD-10-CM | POA: Diagnosis not present

## 2019-04-25 DIAGNOSIS — J449 Chronic obstructive pulmonary disease, unspecified: Secondary | ICD-10-CM | POA: Diagnosis not present

## 2019-04-25 DIAGNOSIS — I129 Hypertensive chronic kidney disease with stage 1 through stage 4 chronic kidney disease, or unspecified chronic kidney disease: Secondary | ICD-10-CM | POA: Diagnosis not present

## 2019-04-25 DIAGNOSIS — I69351 Hemiplegia and hemiparesis following cerebral infarction affecting right dominant side: Secondary | ICD-10-CM | POA: Diagnosis not present

## 2019-04-25 DIAGNOSIS — F329 Major depressive disorder, single episode, unspecified: Secondary | ICD-10-CM | POA: Diagnosis not present

## 2019-04-25 DIAGNOSIS — S72042A Displaced fracture of base of neck of left femur, initial encounter for closed fracture: Secondary | ICD-10-CM | POA: Diagnosis not present

## 2019-04-26 DIAGNOSIS — M19042 Primary osteoarthritis, left hand: Secondary | ICD-10-CM | POA: Diagnosis not present

## 2019-04-26 DIAGNOSIS — I69351 Hemiplegia and hemiparesis following cerebral infarction affecting right dominant side: Secondary | ICD-10-CM | POA: Diagnosis not present

## 2019-04-26 DIAGNOSIS — S72042A Displaced fracture of base of neck of left femur, initial encounter for closed fracture: Secondary | ICD-10-CM | POA: Diagnosis not present

## 2019-04-26 DIAGNOSIS — F329 Major depressive disorder, single episode, unspecified: Secondary | ICD-10-CM | POA: Diagnosis not present

## 2019-04-26 DIAGNOSIS — J449 Chronic obstructive pulmonary disease, unspecified: Secondary | ICD-10-CM | POA: Diagnosis not present

## 2019-04-26 DIAGNOSIS — S72052D Unspecified fracture of head of left femur, subsequent encounter for closed fracture with routine healing: Secondary | ICD-10-CM | POA: Diagnosis not present

## 2019-04-26 DIAGNOSIS — N189 Chronic kidney disease, unspecified: Secondary | ICD-10-CM | POA: Diagnosis not present

## 2019-04-26 DIAGNOSIS — I129 Hypertensive chronic kidney disease with stage 1 through stage 4 chronic kidney disease, or unspecified chronic kidney disease: Secondary | ICD-10-CM | POA: Diagnosis not present

## 2019-04-26 DIAGNOSIS — M19041 Primary osteoarthritis, right hand: Secondary | ICD-10-CM | POA: Diagnosis not present

## 2019-04-27 DIAGNOSIS — I69351 Hemiplegia and hemiparesis following cerebral infarction affecting right dominant side: Secondary | ICD-10-CM | POA: Diagnosis not present

## 2019-04-27 DIAGNOSIS — N189 Chronic kidney disease, unspecified: Secondary | ICD-10-CM | POA: Diagnosis not present

## 2019-04-27 DIAGNOSIS — F329 Major depressive disorder, single episode, unspecified: Secondary | ICD-10-CM | POA: Diagnosis not present

## 2019-04-27 DIAGNOSIS — S72052D Unspecified fracture of head of left femur, subsequent encounter for closed fracture with routine healing: Secondary | ICD-10-CM | POA: Diagnosis not present

## 2019-04-27 DIAGNOSIS — J449 Chronic obstructive pulmonary disease, unspecified: Secondary | ICD-10-CM | POA: Diagnosis not present

## 2019-04-27 DIAGNOSIS — M19042 Primary osteoarthritis, left hand: Secondary | ICD-10-CM | POA: Diagnosis not present

## 2019-04-27 DIAGNOSIS — M19041 Primary osteoarthritis, right hand: Secondary | ICD-10-CM | POA: Diagnosis not present

## 2019-04-27 DIAGNOSIS — S72042A Displaced fracture of base of neck of left femur, initial encounter for closed fracture: Secondary | ICD-10-CM | POA: Diagnosis not present

## 2019-04-27 DIAGNOSIS — I129 Hypertensive chronic kidney disease with stage 1 through stage 4 chronic kidney disease, or unspecified chronic kidney disease: Secondary | ICD-10-CM | POA: Diagnosis not present

## 2019-04-30 DIAGNOSIS — F329 Major depressive disorder, single episode, unspecified: Secondary | ICD-10-CM | POA: Diagnosis not present

## 2019-04-30 DIAGNOSIS — I129 Hypertensive chronic kidney disease with stage 1 through stage 4 chronic kidney disease, or unspecified chronic kidney disease: Secondary | ICD-10-CM | POA: Diagnosis not present

## 2019-04-30 DIAGNOSIS — J449 Chronic obstructive pulmonary disease, unspecified: Secondary | ICD-10-CM | POA: Diagnosis not present

## 2019-04-30 DIAGNOSIS — S72042A Displaced fracture of base of neck of left femur, initial encounter for closed fracture: Secondary | ICD-10-CM | POA: Diagnosis not present

## 2019-04-30 DIAGNOSIS — N189 Chronic kidney disease, unspecified: Secondary | ICD-10-CM | POA: Diagnosis not present

## 2019-04-30 DIAGNOSIS — I69351 Hemiplegia and hemiparesis following cerebral infarction affecting right dominant side: Secondary | ICD-10-CM | POA: Diagnosis not present

## 2019-04-30 DIAGNOSIS — M19041 Primary osteoarthritis, right hand: Secondary | ICD-10-CM | POA: Diagnosis not present

## 2019-04-30 DIAGNOSIS — M19042 Primary osteoarthritis, left hand: Secondary | ICD-10-CM | POA: Diagnosis not present

## 2019-04-30 DIAGNOSIS — S72052D Unspecified fracture of head of left femur, subsequent encounter for closed fracture with routine healing: Secondary | ICD-10-CM | POA: Diagnosis not present

## 2019-04-30 NOTE — Telephone Encounter (Signed)
Attempted a 2nd time to call the patient to review the results with her. There was no answer. Unable to leave a message.

## 2019-05-02 NOTE — Telephone Encounter (Signed)
Notes recorded by Desmond Lope, RN on 04/30/2019 at 1:35 PM EST  Attempted to reach patient again. No answer and unable to leave message (voicemail box full).  ------

## 2019-05-03 DIAGNOSIS — I129 Hypertensive chronic kidney disease with stage 1 through stage 4 chronic kidney disease, or unspecified chronic kidney disease: Secondary | ICD-10-CM | POA: Diagnosis not present

## 2019-05-03 DIAGNOSIS — S72042A Displaced fracture of base of neck of left femur, initial encounter for closed fracture: Secondary | ICD-10-CM | POA: Diagnosis not present

## 2019-05-03 DIAGNOSIS — F329 Major depressive disorder, single episode, unspecified: Secondary | ICD-10-CM | POA: Diagnosis not present

## 2019-05-03 DIAGNOSIS — S72052D Unspecified fracture of head of left femur, subsequent encounter for closed fracture with routine healing: Secondary | ICD-10-CM | POA: Diagnosis not present

## 2019-05-03 DIAGNOSIS — M19042 Primary osteoarthritis, left hand: Secondary | ICD-10-CM | POA: Diagnosis not present

## 2019-05-03 DIAGNOSIS — M19041 Primary osteoarthritis, right hand: Secondary | ICD-10-CM | POA: Diagnosis not present

## 2019-05-03 DIAGNOSIS — I69351 Hemiplegia and hemiparesis following cerebral infarction affecting right dominant side: Secondary | ICD-10-CM | POA: Diagnosis not present

## 2019-05-03 DIAGNOSIS — N189 Chronic kidney disease, unspecified: Secondary | ICD-10-CM | POA: Diagnosis not present

## 2019-05-03 DIAGNOSIS — J449 Chronic obstructive pulmonary disease, unspecified: Secondary | ICD-10-CM | POA: Diagnosis not present

## 2019-05-07 DIAGNOSIS — N189 Chronic kidney disease, unspecified: Secondary | ICD-10-CM | POA: Diagnosis not present

## 2019-05-07 DIAGNOSIS — I69351 Hemiplegia and hemiparesis following cerebral infarction affecting right dominant side: Secondary | ICD-10-CM | POA: Diagnosis not present

## 2019-05-07 DIAGNOSIS — M19041 Primary osteoarthritis, right hand: Secondary | ICD-10-CM | POA: Diagnosis not present

## 2019-05-07 DIAGNOSIS — I129 Hypertensive chronic kidney disease with stage 1 through stage 4 chronic kidney disease, or unspecified chronic kidney disease: Secondary | ICD-10-CM | POA: Diagnosis not present

## 2019-05-07 DIAGNOSIS — J449 Chronic obstructive pulmonary disease, unspecified: Secondary | ICD-10-CM | POA: Diagnosis not present

## 2019-05-07 DIAGNOSIS — S72052D Unspecified fracture of head of left femur, subsequent encounter for closed fracture with routine healing: Secondary | ICD-10-CM | POA: Diagnosis not present

## 2019-05-07 DIAGNOSIS — S72042A Displaced fracture of base of neck of left femur, initial encounter for closed fracture: Secondary | ICD-10-CM | POA: Diagnosis not present

## 2019-05-07 DIAGNOSIS — M19042 Primary osteoarthritis, left hand: Secondary | ICD-10-CM | POA: Diagnosis not present

## 2019-05-07 DIAGNOSIS — F329 Major depressive disorder, single episode, unspecified: Secondary | ICD-10-CM | POA: Diagnosis not present

## 2019-05-09 DIAGNOSIS — N189 Chronic kidney disease, unspecified: Secondary | ICD-10-CM | POA: Diagnosis not present

## 2019-05-09 DIAGNOSIS — F329 Major depressive disorder, single episode, unspecified: Secondary | ICD-10-CM | POA: Diagnosis not present

## 2019-05-09 DIAGNOSIS — S72052D Unspecified fracture of head of left femur, subsequent encounter for closed fracture with routine healing: Secondary | ICD-10-CM | POA: Diagnosis not present

## 2019-05-09 DIAGNOSIS — J449 Chronic obstructive pulmonary disease, unspecified: Secondary | ICD-10-CM | POA: Diagnosis not present

## 2019-05-09 DIAGNOSIS — S72042A Displaced fracture of base of neck of left femur, initial encounter for closed fracture: Secondary | ICD-10-CM | POA: Diagnosis not present

## 2019-05-09 DIAGNOSIS — I69351 Hemiplegia and hemiparesis following cerebral infarction affecting right dominant side: Secondary | ICD-10-CM | POA: Diagnosis not present

## 2019-05-09 DIAGNOSIS — M19042 Primary osteoarthritis, left hand: Secondary | ICD-10-CM | POA: Diagnosis not present

## 2019-05-09 DIAGNOSIS — I129 Hypertensive chronic kidney disease with stage 1 through stage 4 chronic kidney disease, or unspecified chronic kidney disease: Secondary | ICD-10-CM | POA: Diagnosis not present

## 2019-05-09 DIAGNOSIS — M19041 Primary osteoarthritis, right hand: Secondary | ICD-10-CM | POA: Diagnosis not present

## 2019-05-14 DIAGNOSIS — M19042 Primary osteoarthritis, left hand: Secondary | ICD-10-CM | POA: Diagnosis not present

## 2019-05-14 DIAGNOSIS — I69351 Hemiplegia and hemiparesis following cerebral infarction affecting right dominant side: Secondary | ICD-10-CM | POA: Diagnosis not present

## 2019-05-14 DIAGNOSIS — J449 Chronic obstructive pulmonary disease, unspecified: Secondary | ICD-10-CM | POA: Diagnosis not present

## 2019-05-14 DIAGNOSIS — N189 Chronic kidney disease, unspecified: Secondary | ICD-10-CM | POA: Diagnosis not present

## 2019-05-14 DIAGNOSIS — S72052D Unspecified fracture of head of left femur, subsequent encounter for closed fracture with routine healing: Secondary | ICD-10-CM | POA: Diagnosis not present

## 2019-05-14 DIAGNOSIS — I129 Hypertensive chronic kidney disease with stage 1 through stage 4 chronic kidney disease, or unspecified chronic kidney disease: Secondary | ICD-10-CM | POA: Diagnosis not present

## 2019-05-14 DIAGNOSIS — S72042A Displaced fracture of base of neck of left femur, initial encounter for closed fracture: Secondary | ICD-10-CM | POA: Diagnosis not present

## 2019-05-14 DIAGNOSIS — M19041 Primary osteoarthritis, right hand: Secondary | ICD-10-CM | POA: Diagnosis not present

## 2019-05-14 DIAGNOSIS — F329 Major depressive disorder, single episode, unspecified: Secondary | ICD-10-CM | POA: Diagnosis not present

## 2019-05-18 DIAGNOSIS — S72052D Unspecified fracture of head of left femur, subsequent encounter for closed fracture with routine healing: Secondary | ICD-10-CM | POA: Diagnosis not present

## 2019-05-18 DIAGNOSIS — S72042A Displaced fracture of base of neck of left femur, initial encounter for closed fracture: Secondary | ICD-10-CM | POA: Diagnosis not present

## 2019-05-18 DIAGNOSIS — M19041 Primary osteoarthritis, right hand: Secondary | ICD-10-CM | POA: Diagnosis not present

## 2019-05-18 DIAGNOSIS — I69351 Hemiplegia and hemiparesis following cerebral infarction affecting right dominant side: Secondary | ICD-10-CM | POA: Diagnosis not present

## 2019-05-18 DIAGNOSIS — F329 Major depressive disorder, single episode, unspecified: Secondary | ICD-10-CM | POA: Diagnosis not present

## 2019-05-18 DIAGNOSIS — J449 Chronic obstructive pulmonary disease, unspecified: Secondary | ICD-10-CM | POA: Diagnosis not present

## 2019-05-18 DIAGNOSIS — N189 Chronic kidney disease, unspecified: Secondary | ICD-10-CM | POA: Diagnosis not present

## 2019-05-18 DIAGNOSIS — M19042 Primary osteoarthritis, left hand: Secondary | ICD-10-CM | POA: Diagnosis not present

## 2019-05-18 DIAGNOSIS — I129 Hypertensive chronic kidney disease with stage 1 through stage 4 chronic kidney disease, or unspecified chronic kidney disease: Secondary | ICD-10-CM | POA: Diagnosis not present

## 2019-05-21 DIAGNOSIS — S72042D Displaced fracture of base of neck of left femur, subsequent encounter for closed fracture with routine healing: Secondary | ICD-10-CM | POA: Diagnosis not present

## 2019-05-23 DIAGNOSIS — S72042A Displaced fracture of base of neck of left femur, initial encounter for closed fracture: Secondary | ICD-10-CM | POA: Diagnosis not present

## 2019-05-23 DIAGNOSIS — F329 Major depressive disorder, single episode, unspecified: Secondary | ICD-10-CM | POA: Diagnosis not present

## 2019-05-23 DIAGNOSIS — M19041 Primary osteoarthritis, right hand: Secondary | ICD-10-CM | POA: Diagnosis not present

## 2019-05-23 DIAGNOSIS — S72052D Unspecified fracture of head of left femur, subsequent encounter for closed fracture with routine healing: Secondary | ICD-10-CM | POA: Diagnosis not present

## 2019-05-23 DIAGNOSIS — M19042 Primary osteoarthritis, left hand: Secondary | ICD-10-CM | POA: Diagnosis not present

## 2019-05-23 DIAGNOSIS — I69351 Hemiplegia and hemiparesis following cerebral infarction affecting right dominant side: Secondary | ICD-10-CM | POA: Diagnosis not present

## 2019-05-23 DIAGNOSIS — I129 Hypertensive chronic kidney disease with stage 1 through stage 4 chronic kidney disease, or unspecified chronic kidney disease: Secondary | ICD-10-CM | POA: Diagnosis not present

## 2019-05-23 DIAGNOSIS — N189 Chronic kidney disease, unspecified: Secondary | ICD-10-CM | POA: Diagnosis not present

## 2019-05-23 DIAGNOSIS — J449 Chronic obstructive pulmonary disease, unspecified: Secondary | ICD-10-CM | POA: Diagnosis not present

## 2019-06-01 DIAGNOSIS — S72052D Unspecified fracture of head of left femur, subsequent encounter for closed fracture with routine healing: Secondary | ICD-10-CM | POA: Diagnosis not present

## 2019-06-01 DIAGNOSIS — S72042A Displaced fracture of base of neck of left femur, initial encounter for closed fracture: Secondary | ICD-10-CM | POA: Diagnosis not present

## 2019-06-01 DIAGNOSIS — N189 Chronic kidney disease, unspecified: Secondary | ICD-10-CM | POA: Diagnosis not present

## 2019-06-01 DIAGNOSIS — J449 Chronic obstructive pulmonary disease, unspecified: Secondary | ICD-10-CM | POA: Diagnosis not present

## 2019-06-01 DIAGNOSIS — M19042 Primary osteoarthritis, left hand: Secondary | ICD-10-CM | POA: Diagnosis not present

## 2019-06-01 DIAGNOSIS — F329 Major depressive disorder, single episode, unspecified: Secondary | ICD-10-CM | POA: Diagnosis not present

## 2019-06-01 DIAGNOSIS — I129 Hypertensive chronic kidney disease with stage 1 through stage 4 chronic kidney disease, or unspecified chronic kidney disease: Secondary | ICD-10-CM | POA: Diagnosis not present

## 2019-06-01 DIAGNOSIS — M19041 Primary osteoarthritis, right hand: Secondary | ICD-10-CM | POA: Diagnosis not present

## 2019-06-01 DIAGNOSIS — I69351 Hemiplegia and hemiparesis following cerebral infarction affecting right dominant side: Secondary | ICD-10-CM | POA: Diagnosis not present

## 2019-06-05 ENCOUNTER — Ambulatory Visit: Payer: Self-pay | Admitting: Adult Health

## 2019-06-05 ENCOUNTER — Telehealth: Payer: Self-pay

## 2019-06-05 NOTE — Telephone Encounter (Signed)
Patient was a no call/no show for their appointment today.   

## 2019-06-05 NOTE — Progress Notes (Deleted)
[facility] [address] [place]. [place] [address] [phone]       OFFICE FOLLOW UP NOTE  [name] Date of Birth:  July 21, 1945 Medical Record Number:  [id number]   Reason for Referral:  stroke follow up    CHIEF COMPLAINT:  No chief complaint on file.   HPI:  Stroke admission 01/20/2019: [name] is a 73 y.o. female with history of a TIA / stroke, ongoing tobacco use, and hypertension  presented to [facility] ED on 01/20/2019 with right sided weakness that started improving enroute.   Neurology consulted (Dr. Erlinda Hong) and stroke work-up revealed multiple small left ACA territory infarcts possibly secondary to large vessel disease source but unable to rule out cardioembolic source.  She did not receive IV t-PA due to improving / mild deficits.  MRI head showed left anterior cerebral anterior territory infarcts and severe chronic ischemic microangiopathy.  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  2D echo unremarkable without cardiac source of embolus or PFO.  Recommended undergoing 30-day cardiac event monitor outpatient to rule out atrial fibrillation.  Initiated DAPT for 3 months given intracranial stenosis/occlusion and aspirin alone.  HTN stable with recommendation of BP goal 1 30-1 50 given chronic left M1 occlusion.  LDL 105 initiate atorvastatin 40 mg daily.  A1c 5.7 without evidence of DM.  Current tobacco use of smoking cessation counseling provided.  Other shortness factors include prior stroke, advanced age, THC use and overweight.  She was discharged home in stable condition with with recommendation of outpatient therapy for continued right lower extremity weakness.  Initial visit 02/28/2019: [name] is being seen today for hospital  follow-up and is accompanied by her daughter.  She continues to have residual right-sided weakness but does endorse improvement.  Daughter states that initially upon returning home, she did have worsening of right-sided weakness impairing mobility and functional use of right side.  Once therapies were started approximately 1 week later, weakness significantly improved.  No other symptoms accompanied worsening weakness.  She was initially using a rolling walker but has now graduated to a cane that she will use outdoors and is typically not needed indoors.  She did have a couple falls initially returning to home but no falls recently.  She continues to participate in PT/OT along with maintaining HEP.  Continues aspirin 81 mg and clopidogrel 75 mg daily without bleeding or bruising.  Continues atorvastatin without myalgias.  Blood pressure today satisfactory at 134/90.  She completed 30-day cardiac event monitor yesterday and daughter plans on mailing back today or tomorrow.  Continues tobacco use but has drastically decreased from previously smoking 1 pack/day and now 3 to 5/day.  She does plan on continuation of decreasing now until complete cessation. She was evaluated by vascular surgery who did not recommend any surgical intervention at that time and to follow-up in 1 year for surveillance monitoring.  No further concerns at this time.  Update 06/05/2019: [name] is a 73 year old female who is being seen today for stroke follow-up.  Residual deficits ***.  Since prior visit, she unfortunately sustained a mechanical fall with subsequent left hip pain and proceeded to [place] [facility] ED on 04/04/2019 for further evaluation.  She was found to have left femoral neck fracture and underwent total hip arthroplasty on  2020.  30-day cardiac event monitor negative for atrial fibrillation or other cardiac arrhythmias.  Continues on ***and atorvastatin for secondary stroke prevention without side effects.  Blood pressure  today ***.  No concerns at this time.   ROS:   14 system review of systems performed and negative with exception of dizziness, weakness and tremors  PMH:  Past Medical History:  Diagnosis Date  . Hypertension   . Stroke [facility])     PSH:  Past Surgical History:  Procedure Laterality Date  . ABDOMINAL HYSTERECTOMY    . TOTAL HIP ARTHROPLASTY Left 04/05/2019   Procedure: TOTAL HIP ARTHROPLASTY ANTERIOR APPROACH;  Surgeon: Hessie Knows, MD;  Location: [facility] ORS;  Service: Orthopedics;  Laterality: Left;    Social History:  Social History   Socioeconomic History  . Marital status: Divorced    Spouse name: Not on file  . Number of children: Not on file  . Years of education: Not on file  . Highest education level: Not on file  Occupational History  . Not on file  Social Needs  . Financial resource strain: Not hard at all  . Food insecurity    Worry: Patient refused    Inability: Patient refused  . Transportation needs    Medical: Patient refused    Non-medical: Patient refused  Tobacco Use  . Smoking status: Current Every Day Smoker    Packs/day: 0.25    Years: 30.00    Pack years: 7.50    Types: Cigarettes    Start date: 03/10/1987  . Smokeless tobacco: Never Used  Substance and Sexual Activity  . Alcohol use: No  . Drug use: No  . Sexual activity: Not on file  Lifestyle  . Physical activity    Days per week: Patient refused    Minutes per session: Patient refused  . Stress: Not at all  Relationships  . Social Herbalist on phone: Patient refused    Gets together: Patient refused    Attends religious service: Patient refused    Active member of club or organization: Patient refused    Attends meetings of clubs or organizations: Patient refused    Relationship status: Patient refused  . Intimate partner violence    Fear of current or ex partner: Patient refused    Emotionally abused: Patient refused    Physically abused: Patient refused    Forced  sexual activity: Patient refused  Other Topics Concern  . Not on file  Social History Narrative  . Not on file    Family History:  Family History  Problem Relation Age of Onset  . Heart disease Mother   . Heart disease Father   . Breast cancer Neg Hx     Medications:   Current Outpatient Medications on File Prior to Visit  Medication Sig Dispense Refill  . aspirin EC 81 MG EC tablet Take 1 tablet (81 mg total) by mouth daily. 30 tablet 0  . atorvastatin (LIPITOR) 40 MG tablet Take 1 tablet (40 mg total) by mouth daily at 6 PM. 30 tablet 0  . citalopram (CELEXA) 20 MG tablet Take 20 mg by mouth daily.    . folic acid (FOLVITE) 1 MG tablet Take 1 mg by mouth as directed. Take 1 mg on non-methotrexate days    . hydrochlorothiazide (MICROZIDE) 12.5 MG capsule Take 12.5 mg by mouth daily.    Marland Kitchen ibuprofen (ADVIL) 400 MG tablet Take 1 tablet (400 mg total) by mouth every 6 (six) hours  as needed. 30 tablet 0  . lisinopril (ZESTRIL) 40 MG tablet Take 40 mg by mouth daily.     . Melatonin 5 MG TBDP Take 5 mg by mouth at bedtime.     . methotrexate (RHEUMATREX) 2.5 MG tablet Take 12.5 mg by mouth every Sunday.   2   No current facility-administered medications on file prior to visit.     Allergies:  No Known Allergies   Physical Exam  There were no vitals filed for this visit. There is no height or weight on file to calculate BMI. No exam data present  General: well developed, well nourished,  pleasant elderly Caucasian female, seated, in no evident distress Head: head normocephalic and atraumatic.   Neck: supple with no carotid or supraclavicular bruits Cardiovascular: regular rate and rhythm, no murmurs Musculoskeletal: no deformity Skin:  no rash/petichiae Vascular:  Normal pulses all extremities   Neurologic Exam Mental Status: Awake and fully alert. Oriented to place and time. Recent and remote memory intact. Attention span, concentration and fund of knowledge appropriate.  Mood and affect appropriate.  Cranial Nerves: Fundoscopic exam reveals sharp disc margins. Pupils equal, briskly reactive to light. Extraocular movements full without nystagmus. Visual fields full to confrontation. Hearing intact. Facial sensation intact. Face, tongue, palate moves normally and symmetrically.  Motor: Normal bulk and tone.  RUE: 5-/5 RLE: 4+/5 greatest in hip flexor Full strength left upper and lower extremity Sensory.: intact to touch , pinprick , position and vibratory sensation.  Coordination: Rapid alternating movements normal in all extremities except slightly decreased RUE. Finger-to-nose and heel-to-shin performed accurately on left side with mild difficulty on right side. Gait and Station: Arises from chair without difficulty. Stance is normal. Gait demonstrates normal stride length with occasional difficulty of right leg coordination and assistance of cane Reflexes: 1+ and symmetric. Toes downgoing.     NIHSS  1 Modified Rankin  2    Diagnostic Data (Labs, Imaging, Testing)  Ct Angio Head W Or Wo Contrast Ct Angio Neck W Or Wo Contrast 01/20/2019 IMPRESSION:  1. 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  2. 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  3. Atherosclerotic changes of the proximal internal carotid arteries bilaterally without significant stenosis relative to the more distal vessels.  4. 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  5. Tandem stenosis of the right vertebral artery at the C6 level.  6. Dominant right thyroid nodule. This was noted on previous nuclear medicine study. Consider further evaluation with thyroid ultrasound. If patient is clinically hyperthyroid, consider nuclear medicine thyroid uptake and scan.  7. Atherosclerotic changes within the cavernous internal carotid arteries bilaterally without a significant stenosis.  8. Collateral flow to left MCA branches.  9.  Hypoplastic basilar artery with distal narrowing. Fetal type posterior cerebral arteries are present.     Dg Chest 2 View 01/20/2019 IMPRESSION:  No active cardiopulmonary disease.    Mr Brain Wo Contrast 01/20/2019 IMPRESSION:  1. Multiple small foci of acute ischemia within the left anterior cerebral artery territory. No hemorrhage or mass effect.  2. Severe chronic ischemic microangiopathy.    Ct Head Code Stroke Wo Contrast 01/20/2019 IMPRESSION:  1. No acute intracranial abnormality or significant interval change.  2. Remote lacunar infarcts involving the left caudate head and centrum semi ovale.  3. Advanced atrophy and diffuse white matter disease is otherwise stable.  4. ASPECTS is 10/10   Transthoracic Echocardiogram 01/21/2019 IMPRESSIONS  1. The left ventricle has a visually estimated ejection fraction of 55%. The cavity size was normal. There is mildly increased left ventricular wall thickness. Left ventricular diastolic Doppler parameters are consistent with impaired relaxation.  2. The right ventricle has normal systolic function. The cavity was normal. There is no increase in right ventricular wall thickness. Right ventricular systolic pressure could not be assessed.  3. The aortic valve is tricuspid. Mild aortic annular calcification noted.  4. The mitral valve is grossly normal. Mild calcification of the mitral valve leaflet.  5. The tricuspid valve is grossly normal.  6. The aorta is normal in size and structure.   ECG - SR rate 87 BPM - RBBB - Old inferior Mi    ASSESSMENT: Victoria Gutierrez is a 73 y.o. year old female presented to [facility] ED with right-sided weakness on 01/20/2019 with stroke work-up showing multiple small left ACA territory infarcts possibly secondary to large vessel disease from left ICA bulb arthrosclerosis but unable to rule out cardioembolic source. Vascular risk factors include prior stroke, tobacco use, THC use, HTN, HLD, intra-and  extracranial stenosis.  Residual deficits of mild right hemiparesis but overall recovering well    PLAN:  1. Multiple left ACA territory infarcts: Continue aspirin 81 mg daily  and Plavix for secondary stroke prevention.  Recommend continuation of DAPT until 04/24/2019 as at that time 27-month DAPT completed and can discontinue Plavix with continuation of aspirin alone.  Maintain strict control of hypertension with blood pressure goal below 130/90, diabetes with hemoglobin A1c goal below 6.5% and cholesterol with LDL cholesterol (bad cholesterol) goal below 70 mg/dL.  I also advised the patient to eat a healthy diet with plenty of whole grains, cereals, fruits and vegetables, exercise regularly with at least 30 minutes of continuous activity daily and maintain ideal body weight.  30-day cardiac event monitor completed yesterday with pending results for potential atrial fibrillation 2. Large vessel disease: Continue DAPT for total of 3 months along with use of statin.  She will follow-up with vascular surgery Dr. Rollene Rotunda in 1 year for repeat surveillance monitoring 3. HTN: Advised to continue current treatment regimen.  Today's BP stable.  Advised to continue to monitor at home along with continued follow-up with PCP for management 4. HLD: Advised to continue current treatment regimen along with continued follow-up with PCP for future prescribing and monitoring of lipid panel 5. Residual deficits: Ongoing participation in home health therapies and continuation of HEP for ongoing improvement 6. Tobacco use: Highly encouraged ongoing decreasing daily amount until complete cessation for secondary stroke prevention    Follow up in 3 months or call earlier if needed   Greater than 50% of time during this 45 minute visit was spent on counseling, explanation of diagnosis of left ACA territory infarcts, reviewing risk factor management of large vessel disease, HTN, HLD and tobacco use, planning of further  management along with potential future management, and discussion with patient and family answering all questions.    Frann Rider, AGNP-BC  [facility] [address] [place], [place] [address]  Phone [phone] Fax [phone] Note: This document was prepared with digital dictation and possible smart phrase technology. Any transcriptional errors that result from this process are unintentional.

## 2019-06-12 ENCOUNTER — Encounter: Payer: Self-pay | Admitting: Adult Health

## 2019-07-25 DIAGNOSIS — H2513 Age-related nuclear cataract, bilateral: Secondary | ICD-10-CM | POA: Diagnosis not present

## 2019-07-30 DIAGNOSIS — L409 Psoriasis, unspecified: Principal | ICD-10-CM

## 2019-07-30 MED ORDER — METHOTREXATE SODIUM 2.5 MG TABLET
ORAL_TABLET | 0 refills | 0 days | Status: CP
Start: 2019-07-30 — End: ?

## 2019-08-30 DIAGNOSIS — H2513 Age-related nuclear cataract, bilateral: Secondary | ICD-10-CM | POA: Diagnosis not present

## 2019-09-04 DIAGNOSIS — H2511 Age-related nuclear cataract, right eye: Secondary | ICD-10-CM | POA: Diagnosis not present

## 2019-09-04 DIAGNOSIS — M1991 Primary osteoarthritis, unspecified site: Secondary | ICD-10-CM | POA: Diagnosis not present

## 2019-09-13 ENCOUNTER — Other Ambulatory Visit: Payer: Self-pay

## 2019-09-13 ENCOUNTER — Encounter: Payer: Self-pay | Admitting: Ophthalmology

## 2019-09-14 ENCOUNTER — Other Ambulatory Visit
Admission: RE | Admit: 2019-09-14 | Discharge: 2019-09-14 | Disposition: A | Discharge status: Home / Self Care | Payer: Medicare HMO | Source: Ambulatory Visit | Attending: Ophthalmology | Admitting: Ophthalmology

## 2019-09-14 DIAGNOSIS — Z01812 Encounter for preprocedural laboratory examination: Secondary | ICD-10-CM | POA: Diagnosis not present

## 2019-09-14 DIAGNOSIS — Z20822 Contact with and (suspected) exposure to covid-19: Secondary | ICD-10-CM | POA: Insufficient documentation

## 2019-09-15 LAB — SARS CORONAVIRUS 2 (TAT 6-24 HRS): SARS Coronavirus 2: NEGATIVE

## 2019-09-17 NOTE — Discharge Instructions (Signed)

## 2019-09-18 ENCOUNTER — Other Ambulatory Visit: Payer: Self-pay

## 2019-09-18 ENCOUNTER — Ambulatory Visit: Payer: Medicare HMO | Admitting: Anesthesiology

## 2019-09-18 ENCOUNTER — Encounter
Admission: RE | Disposition: A | Discharge status: Home / Self Care | Payer: Self-pay | Source: Ambulatory Visit | Attending: Ophthalmology

## 2019-09-18 ENCOUNTER — Encounter: Payer: Self-pay | Admitting: Ophthalmology

## 2019-09-18 ENCOUNTER — Ambulatory Visit
Admission: RE | Admit: 2019-09-18 | Discharge: 2019-09-18 | Disposition: A | Discharge status: Home / Self Care | Payer: Medicare HMO | Source: Ambulatory Visit | Attending: Ophthalmology | Admitting: Ophthalmology

## 2019-09-18 DIAGNOSIS — H9190 Unspecified hearing loss, unspecified ear: Secondary | ICD-10-CM | POA: Insufficient documentation

## 2019-09-18 DIAGNOSIS — Z8673 Personal history of transient ischemic attack (TIA), and cerebral infarction without residual deficits: Secondary | ICD-10-CM | POA: Insufficient documentation

## 2019-09-18 DIAGNOSIS — Z79899 Other long term (current) drug therapy: Secondary | ICD-10-CM | POA: Insufficient documentation

## 2019-09-18 DIAGNOSIS — F17210 Nicotine dependence, cigarettes, uncomplicated: Secondary | ICD-10-CM | POA: Diagnosis not present

## 2019-09-18 DIAGNOSIS — I10 Essential (primary) hypertension: Secondary | ICD-10-CM | POA: Insufficient documentation

## 2019-09-18 DIAGNOSIS — H2511 Age-related nuclear cataract, right eye: Secondary | ICD-10-CM | POA: Diagnosis not present

## 2019-09-18 DIAGNOSIS — H25811 Combined forms of age-related cataract, right eye: Secondary | ICD-10-CM | POA: Diagnosis not present

## 2019-09-18 DIAGNOSIS — Z7982 Long term (current) use of aspirin: Secondary | ICD-10-CM | POA: Insufficient documentation

## 2019-09-18 DIAGNOSIS — Z96649 Presence of unspecified artificial hip joint: Secondary | ICD-10-CM | POA: Insufficient documentation

## 2019-09-18 HISTORY — DX: Presence of external hearing-aid: Z97.4

## 2019-09-18 HISTORY — PX: CATARACT EXTRACTION W/PHACO: SHX586

## 2019-09-18 HISTORY — DX: Unspecified hearing loss, unspecified ear: H91.90

## 2019-09-18 HISTORY — DX: Unspecified osteoarthritis, unspecified site: M19.90

## 2019-09-18 HISTORY — DX: Personal history of other endocrine, nutritional and metabolic disease: Z86.39

## 2019-09-18 SURGERY — PHACOEMULSIFICATION, CATARACT, WITH IOL INSERTION
Anesthesia: Monitor Anesthesia Care | Site: Eye | Laterality: Right

## 2019-09-18 MED ORDER — MOXIFLOXACIN HCL 0.5 % OP SOLN
OPHTHALMIC | Status: DC | PRN
  Administered 2019-09-18: 0.2 mL via OPHTHALMIC

## 2019-09-18 MED ORDER — LIDOCAINE HCL (PF) 2 % IJ SOLN
INTRAOCULAR | Status: DC | PRN
  Administered 2019-09-18: 1 mL

## 2019-09-18 MED ORDER — BRIMONIDINE TARTRATE-TIMOLOL 0.2-0.5 % OP SOLN
OPHTHALMIC | Status: DC | PRN
  Administered 2019-09-18: 1 [drp] via OPHTHALMIC

## 2019-09-18 MED ORDER — EPINEPHRINE PF 1 MG/ML IJ SOLN
INTRAOCULAR | Status: DC | PRN
  Administered 2019-09-18: 12:00:00 58 mL via OPHTHALMIC

## 2019-09-18 MED ORDER — ARMC OPHTHALMIC DILATING DROPS
1.0000 "application " | OPHTHALMIC | Status: DC | PRN
  Administered 2019-09-18 (×3): 1 via OPHTHALMIC

## 2019-09-18 MED ORDER — FENTANYL CITRATE (PF) 100 MCG/2ML IJ SOLN
INTRAMUSCULAR | Status: DC | PRN
  Administered 2019-09-18: 50 ug via INTRAVENOUS

## 2019-09-18 MED ORDER — TETRACAINE HCL 0.5 % OP SOLN
1.0000 [drp] | OPHTHALMIC | Status: DC | PRN
  Administered 2019-09-18 (×3): 1 [drp] via OPHTHALMIC

## 2019-09-18 MED ORDER — MIDAZOLAM HCL 2 MG/2ML IJ SOLN
INTRAMUSCULAR | Status: DC | PRN
  Administered 2019-09-18: 1 mg via INTRAVENOUS

## 2019-09-18 MED ORDER — NA CHONDROIT SULF-NA HYALURON 40-17 MG/ML IO SOLN
INTRAOCULAR | Status: DC | PRN
  Administered 2019-09-18: 1 mL via INTRAOCULAR

## 2019-09-18 SURGICAL SUPPLY — 21 items
CANNULA ANT/CHMB 27G (MISCELLANEOUS) ×2 IMPLANT
CANNULA ANT/CHMB 27GA (MISCELLANEOUS) ×6 IMPLANT
DISSECTOR HYDRO NUCLEUS 50X22 (MISCELLANEOUS) ×2 IMPLANT
GLOVE SURG LX 8.0 MICRO (GLOVE) ×2
GLOVE SURG LX STRL 8.0 MICRO (GLOVE) ×1 IMPLANT
GLOVE SURG TRIUMPH 8.0 PF LTX (GLOVE) ×3 IMPLANT
GOWN STRL REUS W/ TWL LRG LVL3 (GOWN DISPOSABLE) ×2 IMPLANT
GOWN STRL REUS W/TWL LRG LVL3 (GOWN DISPOSABLE) ×4
LENS IOL DIOP 20.0 (Intraocular Lens) ×3 IMPLANT
LENS IOL TECNIS MONO 20.0 (Intraocular Lens) IMPLANT
MARKER SKIN DUAL TIP RULER LAB (MISCELLANEOUS) ×3 IMPLANT
NDL FILTER BLUNT 18X1 1/2 (NEEDLE) ×1 IMPLANT
NEEDLE FILTER BLUNT 18X 1/2SAF (NEEDLE) ×2
NEEDLE FILTER BLUNT 18X1 1/2 (NEEDLE) ×1 IMPLANT
PACK EYE AFTER SURG (MISCELLANEOUS) ×3 IMPLANT
PACK OPTHALMIC (MISCELLANEOUS) ×3 IMPLANT
PACK PORFILIO (MISCELLANEOUS) ×3 IMPLANT
SYR 3ML LL SCALE MARK (SYRINGE) ×3 IMPLANT
SYR TB 1ML LUER SLIP (SYRINGE) ×3 IMPLANT
WATER STERILE IRR 250ML POUR (IV SOLUTION) ×3 IMPLANT
WIPE NON LINTING 3.25X3.25 (MISCELLANEOUS) ×3 IMPLANT

## 2019-09-18 NOTE — Anesthesia Postprocedure Evaluation (Signed)
Anesthesia Post Note  Patient: Victoria Gutierrez  Procedure(s) Performed: CATARACT EXTRACTION PHACO AND INTRAOCULAR LENS PLACEMENT (IOC) RIGHT 11.65  01:03.3 (Right Eye)     Patient location during evaluation: PACU Anesthesia Type: MAC Level of consciousness: awake and alert Pain management: pain level controlled Vital Signs Assessment: post-procedure vital signs reviewed and stable Respiratory status: spontaneous breathing Cardiovascular status: blood pressure returned to baseline Postop Assessment: no apparent nausea or vomiting, adequate PO intake and no headache Anesthetic complications: no    Adele Barthel Marqueta Pulley

## 2019-09-18 NOTE — Transfer of Care (Signed)
Immediate Anesthesia Transfer of Care Note  Patient: Victoria Gutierrez  Procedure(s) Performed: CATARACT EXTRACTION PHACO AND INTRAOCULAR LENS PLACEMENT (IOC) RIGHT 11.65  01:03.3 (Right Eye)  Patient Location: PACU  Anesthesia Type: MAC  Level of Consciousness: awake, alert  and patient cooperative  Airway and Oxygen Therapy: Patient Spontanous Breathing and Patient connected to supplemental oxygen  Post-op Assessment: Post-op Vital signs reviewed, Patient's Cardiovascular Status Stable, Respiratory Function Stable, Patent Airway and No signs of Nausea or vomiting  Post-op Vital Signs: Reviewed and stable  Complications: No apparent anesthesia complications

## 2019-09-18 NOTE — Op Note (Signed)
PREOPERATIVE DIAGNOSIS:  Nuclear sclerotic cataract of the right eye.   POSTOPERATIVE DIAGNOSIS:  H25.11 Cataract   OPERATIVE PROCEDURE:@   SURGEON:  Birder Robson, MD.   ANESTHESIA:  Anesthesiologist: Page, Adele Barthel, MD CRNA: Silvana Newness, CRNA  1.      Managed anesthesia care. 2.      0.20ml of Shugarcaine was instilled in the eye following the paracentesis.   COMPLICATIONS:  None.   TECHNIQUE:   Stop and chop   DESCRIPTION OF PROCEDURE:  The patient was examined and consented in the preoperative holding area where the aforementioned topical anesthesia was applied to the right eye and then brought back to the Operating Room where the right eye was prepped and draped in the usual sterile ophthalmic fashion and a lid speculum was placed. A paracentesis was created with the side port blade and the anterior chamber was filled with viscoelastic. A near clear corneal incision was performed with the steel keratome. A continuous curvilinear capsulorrhexis was performed with a cystotome followed by the capsulorrhexis forceps. Hydrodissection and hydrodelineation were carried out with BSS on a blunt cannula. The lens was removed in a stop and chop  technique and the remaining cortical material was removed with the irrigation-aspiration handpiece. The capsular bag was inflated with viscoelastic and the Technis ZCB00  lens was placed in the capsular bag without complication. The remaining viscoelastic was removed from the eye with the irrigation-aspiration handpiece. The wounds were hydrated. The anterior chamber was flushed with BSS and the eye was inflated to physiologic pressure. 0.23ml of Vigamox was placed in the anterior chamber. The wounds were found to be water tight. The eye was dressed with Combigan. The patient was given protective glasses to wear throughout the day and a shield with which to sleep tonight. The patient was also given drops with which to begin a drop regimen today and will  follow-up with me in one day. Implant Name Type Inv. Item Serial No. Manufacturer Lot No. LRB No. Used Action  LENS IOL DIOP 20.0 - [id number] Intraocular Lens LENS IOL DIOP 20.0 [id number]  Right 1 Implanted   Procedure(s): CATARACT EXTRACTION PHACO AND INTRAOCULAR LENS PLACEMENT (IOC) RIGHT 11.65  01:03.3 (Right)  Electronically signed: Birder Robson 09/18/2019 11:35 AM

## 2019-09-18 NOTE — Anesthesia Preprocedure Evaluation (Signed)
Anesthesia Evaluation  Patient identified by MRN, date of birth, ID band Patient awake    History of Anesthesia Complications Negative for: history of anesthetic complications  Airway Mallampati: II  TM Distance: >3 FB Neck ROM: Full    Dental  (+) Upper Dentures   Pulmonary Current Smoker (5-10 cigarettes per day) and Patient abstained from smoking.,    Pulmonary exam normal        Cardiovascular hypertension, Pt. 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 ), No Residual Symptoms    GI/Hepatic negative GI ROS, Neg liver ROS,   Endo/Other  negative endocrine ROS  Renal/GU negative Renal ROS     Musculoskeletal psoriasis   Abdominal   Peds  Hematology negative hematology ROS (+)   Anesthesia Other Findings   Reproductive/Obstetrics                             Anesthesia Physical Anesthesia Plan  ASA: III  Anesthesia Plan: MAC   Post-op Pain Management:    Induction: Intravenous  PONV Risk Score and Plan: 1 and Treatment may vary due to age or medical condition, TIVA and Midazolam  Airway Management Planned: Natural Airway and Nasal Cannula  Additional Equipment: None  Intra-op Plan:   Post-operative Plan:   Informed Consent: I have reviewed the patients History and Physical, chart, labs and discussed the procedure including the risks, benefits and alternatives for the proposed anesthesia with the patient or authorized representative who has indicated his/her understanding and acceptance.       Plan Discussed with: CRNA  Anesthesia Plan Comments:         Anesthesia Quick Evaluation

## 2019-09-18 NOTE — H&P (Signed)
All labs reviewed. Abnormal studies sent to patients PCP when indicated.  Previous H&P reviewed, patient examined, there are NO CHANGES.  Victoria Fikes Porfilio3/23/202111:11 AM

## 2019-09-19 ENCOUNTER — Encounter: Payer: Self-pay | Admitting: *Deleted

## 2019-09-24 DIAGNOSIS — H2512 Age-related nuclear cataract, left eye: Secondary | ICD-10-CM | POA: Diagnosis not present

## 2019-09-27 ENCOUNTER — Other Ambulatory Visit: Payer: Self-pay

## 2019-09-27 ENCOUNTER — Encounter: Payer: Self-pay | Admitting: Ophthalmology

## 2019-10-02 ENCOUNTER — Ambulatory Visit: Admit: 2019-10-02 | Discharge: 2019-10-03 | Payer: MEDICARE

## 2019-10-02 DIAGNOSIS — Z79899 Other long term (current) drug therapy: Principal | ICD-10-CM

## 2019-10-02 DIAGNOSIS — L409 Psoriasis, unspecified: Secondary | ICD-10-CM | POA: Diagnosis not present

## 2019-10-02 MED ORDER — CLOBETASOL 0.05 % SCALP SOLUTION
5 refills | 0 days | Status: CP
Start: 2019-10-02 — End: ?

## 2019-10-03 DIAGNOSIS — Z79899 Other long term (current) drug therapy: Principal | ICD-10-CM

## 2019-10-03 DIAGNOSIS — L409 Psoriasis, unspecified: Principal | ICD-10-CM

## 2019-10-03 MED ORDER — METHOTREXATE SODIUM 2.5 MG TABLET
ORAL_TABLET | 3 refills | 0.00000 days | Status: CP
Start: 2019-10-03 — End: ?

## 2019-10-03 MED ORDER — FOLIC ACID 1 MG TABLET
ORAL_TABLET | 3 refills | 0.00000 days | Status: CP
Start: 2019-10-03 — End: ?

## 2019-10-04 ENCOUNTER — Other Ambulatory Visit
Admission: RE | Admit: 2019-10-04 | Discharge: 2019-10-04 | Disposition: A | Discharge status: Home / Self Care | Payer: Medicare HMO | Source: Ambulatory Visit | Attending: Ophthalmology | Admitting: Ophthalmology

## 2019-10-04 DIAGNOSIS — Z20822 Contact with and (suspected) exposure to covid-19: Secondary | ICD-10-CM | POA: Insufficient documentation

## 2019-10-04 DIAGNOSIS — Z01812 Encounter for preprocedural laboratory examination: Secondary | ICD-10-CM | POA: Insufficient documentation

## 2019-10-04 LAB — SARS CORONAVIRUS 2 (TAT 6-24 HRS): SARS Coronavirus 2: NEGATIVE

## 2019-10-04 NOTE — Anesthesia Preprocedure Evaluation (Addendum)
Anesthesia Evaluation  Patient identified by MRN, date of birth, ID band Patient awake    Reviewed: Allergy & Precautions, NPO status , Patient's Chart, lab work & pertinent test results, reviewed documented beta blocker date and time   History of Anesthesia Complications Negative for: history of anesthetic complications  Airway Mallampati: II  TM Distance: >3 FB Neck ROM: Full    Dental  (+) Upper Dentures   Pulmonary Current Smoker and Patient abstained from smoking.,    Pulmonary exam normal breath sounds clear to auscultation       Cardiovascular hypertension, Pt. 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 ), No Residual Symptoms    GI/Hepatic neg GERD  ,  Endo/Other    Renal/GU      Musculoskeletal  (+) Arthritis , psoriasis   Abdominal   Peds  Hematology   Anesthesia Other Findings   Reproductive/Obstetrics                           Anesthesia Physical  Anesthesia Plan  ASA: III  Anesthesia Plan: MAC   Post-op Pain Management:    Induction: Intravenous  PONV Risk Score and Plan: 1 and Treatment may vary due to age or medical condition, TIVA and Midazolam  Airway Management Planned: Natural Airway and Nasal Cannula  Additional Equipment: None  Intra-op Plan:   Post-operative Plan:   Informed Consent: I have reviewed the patients History and Physical, chart, labs and discussed the procedure including the risks, benefits and alternatives for the proposed anesthesia with the patient or authorized representative who has indicated his/her understanding and acceptance.       Plan Discussed with: CRNA  Anesthesia Plan Comments:         Anesthesia Quick Evaluation

## 2019-10-05 ENCOUNTER — Other Ambulatory Visit: Payer: Medicare HMO

## 2019-10-08 NOTE — Discharge Instructions (Signed)

## 2019-10-09 ENCOUNTER — Other Ambulatory Visit: Payer: Self-pay

## 2019-10-09 ENCOUNTER — Ambulatory Visit: Payer: Medicare HMO | Admitting: Anesthesiology

## 2019-10-09 ENCOUNTER — Ambulatory Visit
Admission: RE | Admit: 2019-10-09 | Discharge: 2019-10-09 | Disposition: A | Discharge status: Home / Self Care | Payer: Medicare HMO | Source: Ambulatory Visit | Attending: Ophthalmology | Admitting: Ophthalmology

## 2019-10-09 ENCOUNTER — Encounter
Admission: RE | Disposition: A | Discharge status: Home / Self Care | Payer: Self-pay | Source: Ambulatory Visit | Attending: Ophthalmology

## 2019-10-09 ENCOUNTER — Encounter: Payer: Self-pay | Admitting: Ophthalmology

## 2019-10-09 DIAGNOSIS — E079 Disorder of thyroid, unspecified: Secondary | ICD-10-CM | POA: Insufficient documentation

## 2019-10-09 DIAGNOSIS — H2512 Age-related nuclear cataract, left eye: Secondary | ICD-10-CM | POA: Insufficient documentation

## 2019-10-09 DIAGNOSIS — M1990 Unspecified osteoarthritis, unspecified site: Secondary | ICD-10-CM | POA: Insufficient documentation

## 2019-10-09 DIAGNOSIS — I10 Essential (primary) hypertension: Secondary | ICD-10-CM | POA: Insufficient documentation

## 2019-10-09 DIAGNOSIS — Z8673 Personal history of transient ischemic attack (TIA), and cerebral infarction without residual deficits: Secondary | ICD-10-CM | POA: Diagnosis not present

## 2019-10-09 DIAGNOSIS — H25812 Combined forms of age-related cataract, left eye: Secondary | ICD-10-CM | POA: Diagnosis not present

## 2019-10-09 DIAGNOSIS — F17200 Nicotine dependence, unspecified, uncomplicated: Secondary | ICD-10-CM | POA: Insufficient documentation

## 2019-10-09 HISTORY — PX: CATARACT EXTRACTION W/PHACO: SHX586

## 2019-10-09 SURGERY — PHACOEMULSIFICATION, CATARACT, WITH IOL INSERTION
Anesthesia: Monitor Anesthesia Care | Site: Eye | Laterality: Left

## 2019-10-09 MED ORDER — NA CHONDROIT SULF-NA HYALURON 40-17 MG/ML IO SOLN
INTRAOCULAR | Status: DC | PRN
  Administered 2019-10-09: 1 mL via INTRAOCULAR

## 2019-10-09 MED ORDER — LACTATED RINGERS IV SOLN: 100.0000 mL/h | INTRAVENOUS | Status: DC

## 2019-10-09 MED ORDER — ARMC OPHTHALMIC DILATING DROPS
1.0000 "application " | OPHTHALMIC | Status: DC | PRN
  Administered 2019-10-09 (×3): 1 via OPHTHALMIC

## 2019-10-09 MED ORDER — MOXIFLOXACIN HCL 0.5 % OP SOLN
OPHTHALMIC | Status: DC | PRN
  Administered 2019-10-09: 0.2 mL via OPHTHALMIC

## 2019-10-09 MED ORDER — LIDOCAINE HCL (PF) 2 % IJ SOLN
INTRAOCULAR | Status: DC | PRN
  Administered 2019-10-09: 1 mL

## 2019-10-09 MED ORDER — TETRACAINE HCL 0.5 % OP SOLN
1.0000 [drp] | OPHTHALMIC | Status: DC | PRN
  Administered 2019-10-09 (×3): 1 [drp] via OPHTHALMIC

## 2019-10-09 MED ORDER — FENTANYL CITRATE (PF) 100 MCG/2ML IJ SOLN
INTRAMUSCULAR | Status: DC | PRN
  Administered 2019-10-09: 50 ug via INTRAVENOUS

## 2019-10-09 MED ORDER — EPINEPHRINE PF 1 MG/ML IJ SOLN
INTRAOCULAR | Status: DC | PRN
  Administered 2019-10-09: 48 mL via OPHTHALMIC

## 2019-10-09 MED ORDER — MIDAZOLAM HCL 2 MG/2ML IJ SOLN
INTRAMUSCULAR | Status: DC | PRN
  Administered 2019-10-09: 2 mg via INTRAVENOUS

## 2019-10-09 MED ORDER — BRIMONIDINE TARTRATE-TIMOLOL 0.2-0.5 % OP SOLN
OPHTHALMIC | Status: DC | PRN
  Administered 2019-10-09: 1 [drp] via OPHTHALMIC

## 2019-10-09 SURGICAL SUPPLY — 20 items
CANNULA ANT/CHMB 27G (MISCELLANEOUS) ×2 IMPLANT
CANNULA ANT/CHMB 27GA (MISCELLANEOUS) ×6 IMPLANT
DISSECTOR HYDRO NUCLEUS 50X22 (MISCELLANEOUS) ×3 IMPLANT
GLOVE SURG LX 8.0 MICRO (GLOVE) ×2
GLOVE SURG LX STRL 8.0 MICRO (GLOVE) ×1 IMPLANT
GLOVE SURG TRIUMPH 8.0 PF LTX (GLOVE) ×3 IMPLANT
GOWN STRL REUS W/ TWL LRG LVL3 (GOWN DISPOSABLE) ×2 IMPLANT
GOWN STRL REUS W/TWL LRG LVL3 (GOWN DISPOSABLE) ×4
LENS IOL TECNIS ITEC 19.5 (Intraocular Lens) ×2 IMPLANT
MARKER SKIN DUAL TIP RULER LAB (MISCELLANEOUS) ×3 IMPLANT
NDL FILTER BLUNT 18X1 1/2 (NEEDLE) ×1 IMPLANT
NEEDLE FILTER BLUNT 18X 1/2SAF (NEEDLE) ×2
NEEDLE FILTER BLUNT 18X1 1/2 (NEEDLE) ×1 IMPLANT
PACK EYE AFTER SURG (MISCELLANEOUS) ×3 IMPLANT
PACK OPTHALMIC (MISCELLANEOUS) ×3 IMPLANT
PACK PORFILIO (MISCELLANEOUS) ×3 IMPLANT
SYR 3ML LL SCALE MARK (SYRINGE) ×3 IMPLANT
SYR TB 1ML LUER SLIP (SYRINGE) ×3 IMPLANT
WATER STERILE IRR 250ML POUR (IV SOLUTION) ×3 IMPLANT
WIPE NON LINTING 3.25X3.25 (MISCELLANEOUS) ×3 IMPLANT

## 2019-10-09 NOTE — Anesthesia Postprocedure Evaluation (Signed)
Anesthesia Post Note  Patient: Victoria Gutierrez  Procedure(s) Performed: CATARACT EXTRACTION PHACO AND INTRAOCULAR LENS PLACEMENT (IOC) LEFT 10.30  00:48.5 (Left Eye)     Patient location during evaluation: PACU Anesthesia Type: MAC Level of consciousness: awake and alert Pain management: pain level controlled Vital Signs Assessment: post-procedure vital signs reviewed and stable Respiratory status: spontaneous breathing, nonlabored ventilation, respiratory function stable and patient connected to nasal cannula oxygen Cardiovascular status: stable and blood pressure returned to baseline Postop Assessment: no apparent nausea or vomiting Anesthetic complications: no    Timur Nibert A  Jacinda Kanady

## 2019-10-09 NOTE — Transfer of Care (Signed)
Immediate Anesthesia Transfer of Care Note  Patient: Victoria Gutierrez  Procedure(s) Performed: CATARACT EXTRACTION PHACO AND INTRAOCULAR LENS PLACEMENT (IOC) LEFT 10.30  00:48.5 (Left Eye)  Patient Location: PACU  Anesthesia Type: MAC  Level of Consciousness: awake, alert  and patient cooperative  Airway and Oxygen Therapy: Patient Spontanous Breathing and Patient connected to supplemental oxygen  Post-op Assessment: Post-op Vital signs reviewed, Patient's Cardiovascular Status Stable, Respiratory Function Stable, Patent Airway and No signs of Nausea or vomiting  Post-op Vital Signs: Reviewed and stable  Complications: No apparent anesthesia complications

## 2019-10-09 NOTE — H&P (Signed)
All labs reviewed. Abnormal studies sent to patients PCP when indicated.  Previous H&P reviewed, patient examined, there are NO CHANGES.  Victoria Burgert Porfilio4/13/202110:04 AM

## 2019-10-09 NOTE — Anesthesia Procedure Notes (Signed)
Procedure Name: MAC Performed by: Shahiem Bedwell, CRNA Pre-anesthesia Checklist: Patient identified, Emergency Drugs available, Suction available, Timeout performed and Patient being monitored Patient Re-evaluated:Patient Re-evaluated prior to induction Oxygen Delivery Method: Nasal cannula Placement Confirmation: positive ETCO2       

## 2019-10-09 NOTE — Op Note (Signed)
PREOPERATIVE DIAGNOSIS:  Nuclear sclerotic cataract of the left eye.   POSTOPERATIVE DIAGNOSIS:  Nuclear sclerotic cataract of the left eye.   OPERATIVE PROCEDURE:@   SURGEON:  Birder Robson, MD.   ANESTHESIA:  Anesthesiologist: Heniser, Fredric Dine, MD CRNA: Mayme Genta, CRNA  1.      Managed anesthesia care. 2.     0.25ml of Shugarcaine was instilled following the paracentesis   COMPLICATIONS:  None.   TECHNIQUE:   Stop and chop   DESCRIPTION OF PROCEDURE:  The patient was examined and consented in the preoperative holding area where the aforementioned topical anesthesia was applied to the left eye and then brought back to the Operating Room where the left eye was prepped and draped in the usual sterile ophthalmic fashion and a lid speculum was placed. A paracentesis was created with the side port blade and the anterior chamber was filled with viscoelastic. A near clear corneal incision was performed with the steel keratome. A continuous curvilinear capsulorrhexis was performed with a cystotome followed by the capsulorrhexis forceps. Hydrodissection and hydrodelineation were carried out with BSS on a blunt cannula. The lens was removed in a stop and chop  technique and the remaining cortical material was removed with the irrigation-aspiration handpiece. The capsular bag was inflated with viscoelastic and the Technis ZCB00 lens was placed in the capsular bag without complication. The remaining viscoelastic was removed from the eye with the irrigation-aspiration handpiece. The wounds were hydrated. The anterior chamber was flushed with BSS and the eye was inflated to physiologic pressure. 0.74ml Vigamox was placed in the anterior chamber. The wounds were found to be water tight. The eye was dressed with Combigan. The patient was given protective glasses to wear throughout the day and a shield with which to sleep tonight. The patient was also given drops with which to begin a drop regimen today  and will follow-up with me in one day. Implant Name Type Inv. Item Serial No. Manufacturer Lot No. LRB No. Used Action  LENS IOL DIOP 19.5 - [id number] Intraocular Lens LENS IOL DIOP 19.5 [id number]  Left 1 Implanted    Procedure(s): CATARACT EXTRACTION PHACO AND INTRAOCULAR LENS PLACEMENT (IOC) LEFT 10.30  00:48.5 (Left)  Electronically signed: Birder Robson 10/09/2019 10:29 AM

## 2019-10-10 ENCOUNTER — Encounter: Payer: Self-pay | Admitting: *Deleted

## 2020-02-10 DIAGNOSIS — Z79899 Other long term (current) drug therapy: Principal | ICD-10-CM

## 2020-02-10 MED ORDER — FOLIC ACID 1 MG TABLET
ORAL_TABLET | 3 refills | 0 days
Start: 2020-02-10 — End: ?

## 2020-02-22 DIAGNOSIS — L409 Psoriasis, unspecified: Principal | ICD-10-CM

## 2020-02-22 MED ORDER — METHOTREXATE SODIUM 2.5 MG TABLET
ORAL_TABLET | 3 refills | 0.00000 days | Status: CP
Start: 2020-02-22 — End: ?

## 2020-07-24 DIAGNOSIS — E785 Hyperlipidemia, unspecified: Secondary | ICD-10-CM | POA: Diagnosis not present

## 2020-07-24 DIAGNOSIS — R110 Nausea: Secondary | ICD-10-CM | POA: Diagnosis not present

## 2020-07-24 DIAGNOSIS — R29898 Other symptoms and signs involving the musculoskeletal system: Secondary | ICD-10-CM | POA: Diagnosis not present

## 2020-07-24 DIAGNOSIS — I679 Cerebrovascular disease, unspecified: Secondary | ICD-10-CM | POA: Diagnosis not present

## 2020-07-24 DIAGNOSIS — Z720 Tobacco use: Secondary | ICD-10-CM | POA: Diagnosis not present

## 2020-07-24 DIAGNOSIS — I10 Essential (primary) hypertension: Secondary | ICD-10-CM | POA: Diagnosis not present

## 2020-07-24 DIAGNOSIS — E0520 Thyrotoxicosis with toxic multinodular goiter without thyrotoxic crisis or storm: Secondary | ICD-10-CM | POA: Diagnosis not present

## 2020-08-14 DIAGNOSIS — R29898 Other symptoms and signs involving the musculoskeletal system: Secondary | ICD-10-CM | POA: Diagnosis not present

## 2020-08-14 DIAGNOSIS — M25561 Pain in right knee: Secondary | ICD-10-CM | POA: Diagnosis not present

## 2020-08-14 DIAGNOSIS — M25562 Pain in left knee: Secondary | ICD-10-CM | POA: Diagnosis not present

## 2020-08-14 DIAGNOSIS — E785 Hyperlipidemia, unspecified: Secondary | ICD-10-CM | POA: Diagnosis not present

## 2020-08-14 DIAGNOSIS — I10 Essential (primary) hypertension: Secondary | ICD-10-CM | POA: Diagnosis not present

## 2020-08-14 DIAGNOSIS — R109 Unspecified abdominal pain: Secondary | ICD-10-CM | POA: Diagnosis not present

## 2020-08-18 DIAGNOSIS — L409 Psoriasis, unspecified: Principal | ICD-10-CM

## 2020-08-18 MED ORDER — METHOTREXATE SODIUM 2.5 MG TABLET
ORAL_TABLET | 0 refills | 0.00000 days | Status: CP
Start: 2020-08-18 — End: ?

## 2020-08-19 DIAGNOSIS — M25562 Pain in left knee: Secondary | ICD-10-CM | POA: Diagnosis not present

## 2020-08-19 DIAGNOSIS — M25561 Pain in right knee: Secondary | ICD-10-CM | POA: Diagnosis not present

## 2020-08-19 DIAGNOSIS — R29898 Other symptoms and signs involving the musculoskeletal system: Secondary | ICD-10-CM | POA: Diagnosis not present

## 2020-08-19 DIAGNOSIS — M47817 Spondylosis without myelopathy or radiculopathy, lumbosacral region: Secondary | ICD-10-CM | POA: Diagnosis not present

## 2020-08-19 DIAGNOSIS — M47816 Spondylosis without myelopathy or radiculopathy, lumbar region: Secondary | ICD-10-CM | POA: Diagnosis not present

## 2020-08-21 DIAGNOSIS — M19042 Primary osteoarthritis, left hand: Secondary | ICD-10-CM | POA: Diagnosis not present

## 2020-08-21 DIAGNOSIS — I6521 Occlusion and stenosis of right carotid artery: Secondary | ICD-10-CM | POA: Diagnosis not present

## 2020-08-21 DIAGNOSIS — E785 Hyperlipidemia, unspecified: Secondary | ICD-10-CM | POA: Diagnosis not present

## 2020-08-21 DIAGNOSIS — E0520 Thyrotoxicosis with toxic multinodular goiter without thyrotoxic crisis or storm: Secondary | ICD-10-CM | POA: Diagnosis not present

## 2020-08-21 DIAGNOSIS — I10 Essential (primary) hypertension: Secondary | ICD-10-CM | POA: Diagnosis not present

## 2020-08-21 DIAGNOSIS — L409 Psoriasis, unspecified: Secondary | ICD-10-CM | POA: Diagnosis not present

## 2020-08-21 DIAGNOSIS — M19041 Primary osteoarthritis, right hand: Secondary | ICD-10-CM | POA: Diagnosis not present

## 2020-08-21 DIAGNOSIS — M6281 Muscle weakness (generalized): Secondary | ICD-10-CM | POA: Diagnosis not present

## 2020-08-21 DIAGNOSIS — I69398 Other sequelae of cerebral infarction: Secondary | ICD-10-CM | POA: Diagnosis not present

## 2020-08-28 DIAGNOSIS — E785 Hyperlipidemia, unspecified: Secondary | ICD-10-CM | POA: Diagnosis not present

## 2020-08-28 DIAGNOSIS — I69398 Other sequelae of cerebral infarction: Secondary | ICD-10-CM | POA: Diagnosis not present

## 2020-08-28 DIAGNOSIS — E0520 Thyrotoxicosis with toxic multinodular goiter without thyrotoxic crisis or storm: Secondary | ICD-10-CM | POA: Diagnosis not present

## 2020-08-28 DIAGNOSIS — M19042 Primary osteoarthritis, left hand: Secondary | ICD-10-CM | POA: Diagnosis not present

## 2020-08-28 DIAGNOSIS — M19041 Primary osteoarthritis, right hand: Secondary | ICD-10-CM | POA: Diagnosis not present

## 2020-08-28 DIAGNOSIS — L409 Psoriasis, unspecified: Secondary | ICD-10-CM | POA: Diagnosis not present

## 2020-08-28 DIAGNOSIS — I10 Essential (primary) hypertension: Secondary | ICD-10-CM | POA: Diagnosis not present

## 2020-08-28 DIAGNOSIS — M6281 Muscle weakness (generalized): Secondary | ICD-10-CM | POA: Diagnosis not present

## 2020-08-28 DIAGNOSIS — I6521 Occlusion and stenosis of right carotid artery: Secondary | ICD-10-CM | POA: Diagnosis not present

## 2020-09-01 DIAGNOSIS — M19042 Primary osteoarthritis, left hand: Secondary | ICD-10-CM | POA: Diagnosis not present

## 2020-09-01 DIAGNOSIS — I10 Essential (primary) hypertension: Secondary | ICD-10-CM | POA: Diagnosis not present

## 2020-09-01 DIAGNOSIS — I69398 Other sequelae of cerebral infarction: Secondary | ICD-10-CM | POA: Diagnosis not present

## 2020-09-01 DIAGNOSIS — M6281 Muscle weakness (generalized): Secondary | ICD-10-CM | POA: Diagnosis not present

## 2020-09-01 DIAGNOSIS — L409 Psoriasis, unspecified: Secondary | ICD-10-CM | POA: Diagnosis not present

## 2020-09-01 DIAGNOSIS — I6521 Occlusion and stenosis of right carotid artery: Secondary | ICD-10-CM | POA: Diagnosis not present

## 2020-09-01 DIAGNOSIS — M19041 Primary osteoarthritis, right hand: Secondary | ICD-10-CM | POA: Diagnosis not present

## 2020-09-03 DIAGNOSIS — M6281 Muscle weakness (generalized): Secondary | ICD-10-CM | POA: Diagnosis not present

## 2020-09-03 DIAGNOSIS — I6521 Occlusion and stenosis of right carotid artery: Secondary | ICD-10-CM | POA: Diagnosis not present

## 2020-09-03 DIAGNOSIS — E0520 Thyrotoxicosis with toxic multinodular goiter without thyrotoxic crisis or storm: Secondary | ICD-10-CM | POA: Diagnosis not present

## 2020-09-03 DIAGNOSIS — I10 Essential (primary) hypertension: Secondary | ICD-10-CM | POA: Diagnosis not present

## 2020-09-03 DIAGNOSIS — L409 Psoriasis, unspecified: Secondary | ICD-10-CM | POA: Diagnosis not present

## 2020-09-03 DIAGNOSIS — E785 Hyperlipidemia, unspecified: Secondary | ICD-10-CM | POA: Diagnosis not present

## 2020-09-03 DIAGNOSIS — M19041 Primary osteoarthritis, right hand: Secondary | ICD-10-CM | POA: Diagnosis not present

## 2020-09-03 DIAGNOSIS — M19042 Primary osteoarthritis, left hand: Secondary | ICD-10-CM | POA: Diagnosis not present

## 2020-09-03 DIAGNOSIS — I69398 Other sequelae of cerebral infarction: Secondary | ICD-10-CM | POA: Diagnosis not present

## 2020-09-05 DIAGNOSIS — M5136 Other intervertebral disc degeneration, lumbar region: Secondary | ICD-10-CM | POA: Diagnosis not present

## 2020-09-05 DIAGNOSIS — M48062 Spinal stenosis, lumbar region with neurogenic claudication: Secondary | ICD-10-CM | POA: Diagnosis not present

## 2020-09-05 DIAGNOSIS — M47816 Spondylosis without myelopathy or radiculopathy, lumbar region: Secondary | ICD-10-CM | POA: Diagnosis not present

## 2020-09-08 DIAGNOSIS — M19041 Primary osteoarthritis, right hand: Secondary | ICD-10-CM | POA: Diagnosis not present

## 2020-09-08 DIAGNOSIS — L409 Psoriasis, unspecified: Secondary | ICD-10-CM | POA: Diagnosis not present

## 2020-09-08 DIAGNOSIS — E0520 Thyrotoxicosis with toxic multinodular goiter without thyrotoxic crisis or storm: Secondary | ICD-10-CM | POA: Diagnosis not present

## 2020-09-08 DIAGNOSIS — M19042 Primary osteoarthritis, left hand: Secondary | ICD-10-CM | POA: Diagnosis not present

## 2020-09-08 DIAGNOSIS — I10 Essential (primary) hypertension: Secondary | ICD-10-CM | POA: Diagnosis not present

## 2020-09-08 DIAGNOSIS — I6521 Occlusion and stenosis of right carotid artery: Secondary | ICD-10-CM | POA: Diagnosis not present

## 2020-09-08 DIAGNOSIS — I69398 Other sequelae of cerebral infarction: Secondary | ICD-10-CM | POA: Diagnosis not present

## 2020-09-08 DIAGNOSIS — M6281 Muscle weakness (generalized): Secondary | ICD-10-CM | POA: Diagnosis not present

## 2020-09-08 DIAGNOSIS — E785 Hyperlipidemia, unspecified: Secondary | ICD-10-CM | POA: Diagnosis not present

## 2020-09-16 DIAGNOSIS — E785 Hyperlipidemia, unspecified: Secondary | ICD-10-CM | POA: Diagnosis not present

## 2020-09-16 DIAGNOSIS — M6281 Muscle weakness (generalized): Secondary | ICD-10-CM | POA: Diagnosis not present

## 2020-09-16 DIAGNOSIS — I10 Essential (primary) hypertension: Secondary | ICD-10-CM | POA: Diagnosis not present

## 2020-09-16 DIAGNOSIS — M19041 Primary osteoarthritis, right hand: Secondary | ICD-10-CM | POA: Diagnosis not present

## 2020-09-16 DIAGNOSIS — E0520 Thyrotoxicosis with toxic multinodular goiter without thyrotoxic crisis or storm: Secondary | ICD-10-CM | POA: Diagnosis not present

## 2020-09-16 DIAGNOSIS — I6521 Occlusion and stenosis of right carotid artery: Secondary | ICD-10-CM | POA: Diagnosis not present

## 2020-09-16 DIAGNOSIS — I69398 Other sequelae of cerebral infarction: Secondary | ICD-10-CM | POA: Diagnosis not present

## 2020-09-16 DIAGNOSIS — M19042 Primary osteoarthritis, left hand: Secondary | ICD-10-CM | POA: Diagnosis not present

## 2020-09-16 DIAGNOSIS — L409 Psoriasis, unspecified: Secondary | ICD-10-CM | POA: Diagnosis not present

## 2020-10-31 IMAGING — CR DG HIP (WITH OR WITHOUT PELVIS) 2-3V*L*
3 series · 3 of 3 positions shown · non-contrast
Comparison: None.

CLINICAL DATA: Fall, pain

EXAM:
DG HIP (WITH OR WITHOUT PELVIS) 2-3V LEFT

[pelvis ap]
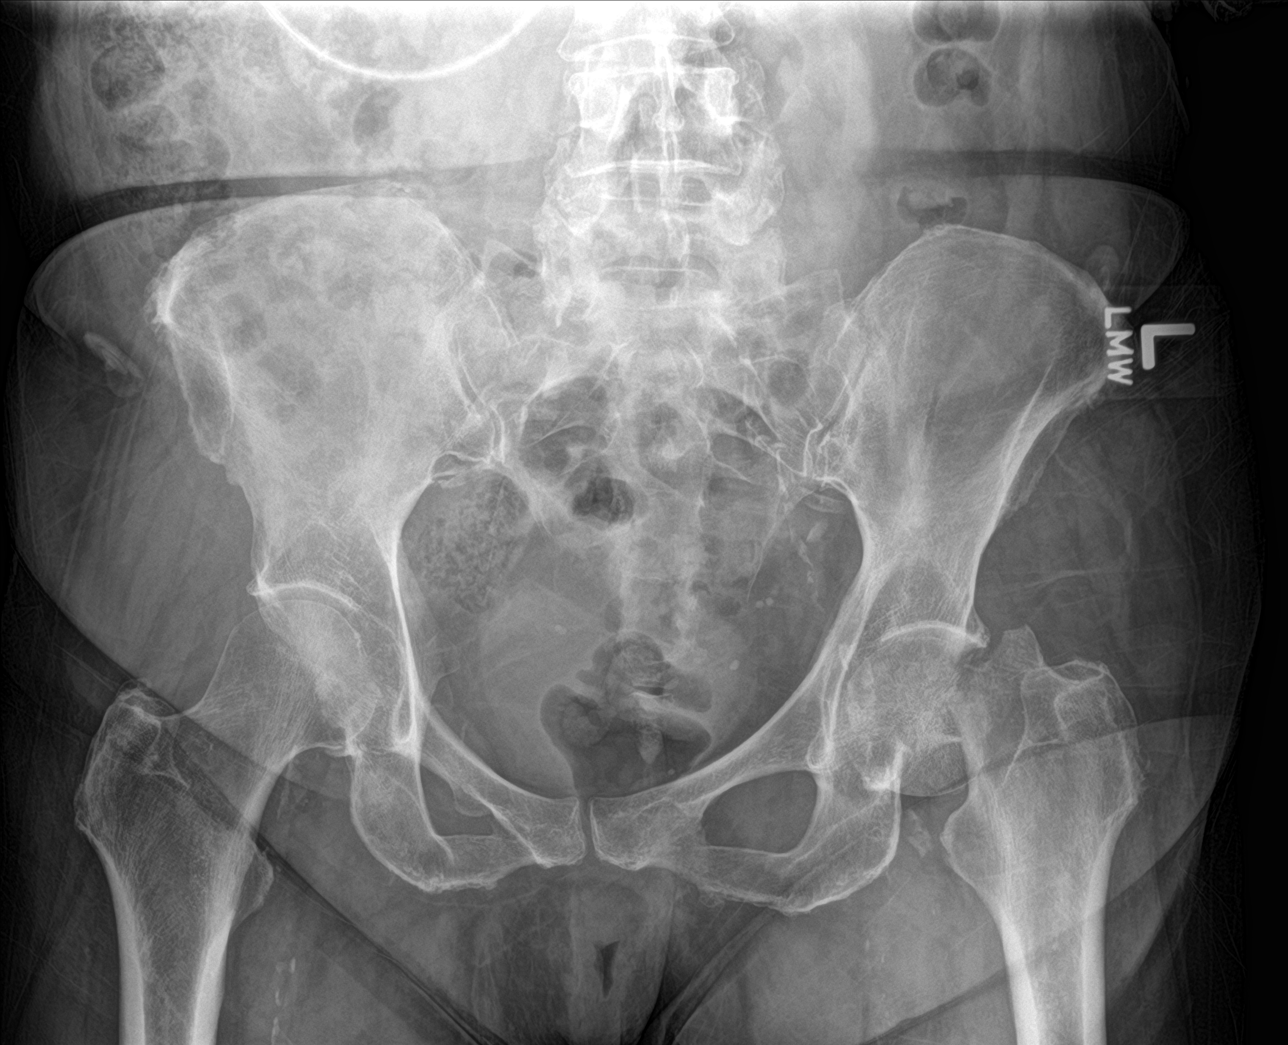

[hip ap]
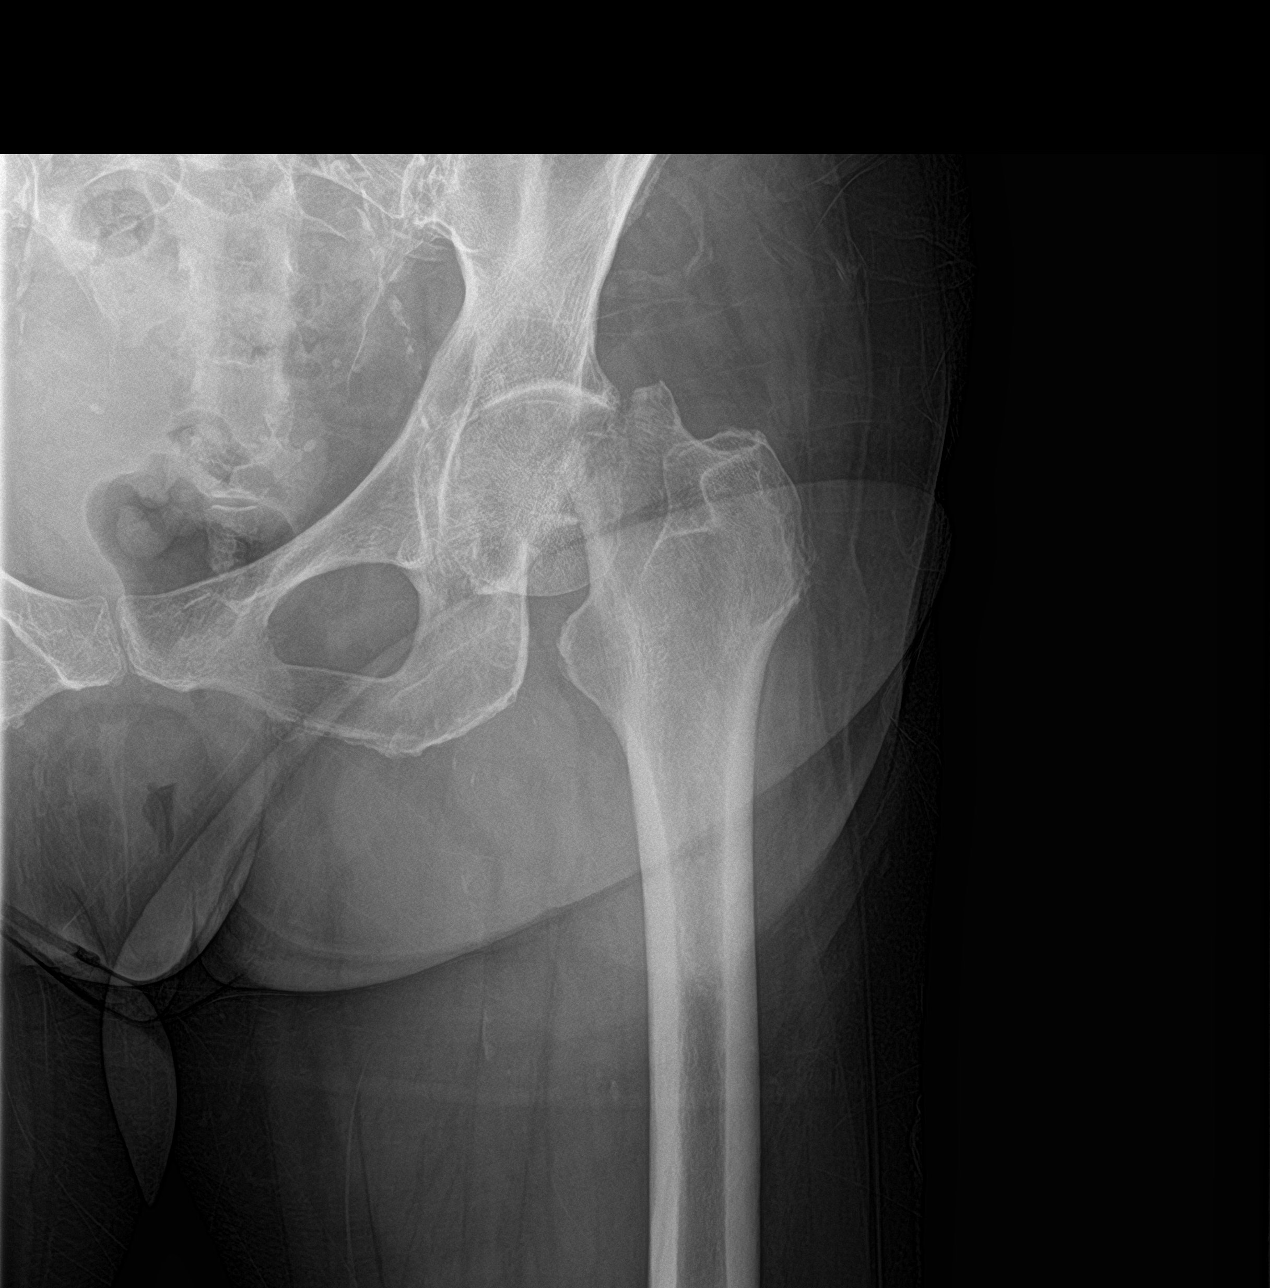

[hip lat]
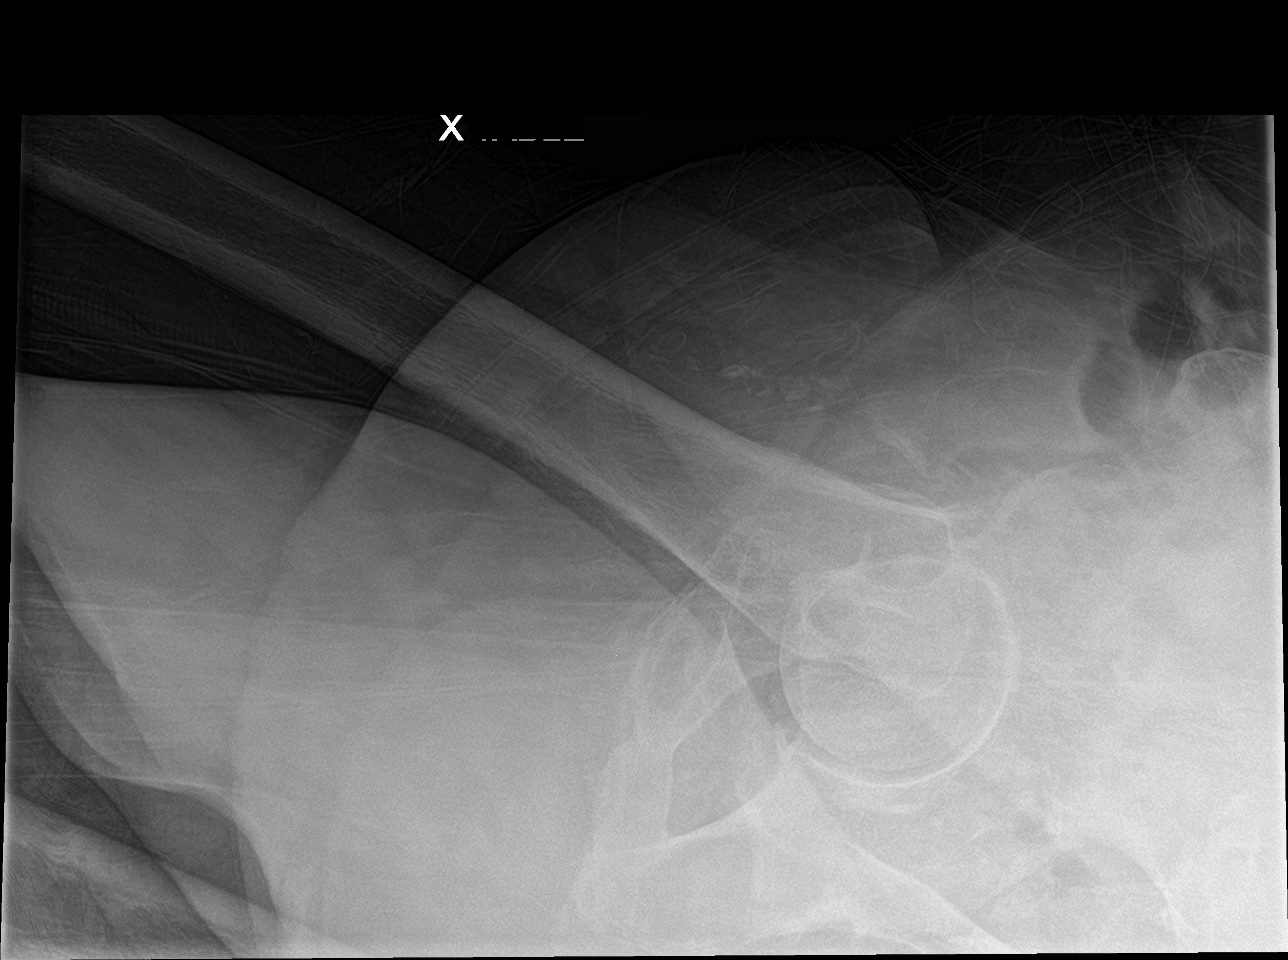

[3 of 3 positions shown; findings below may reference images not displayed]

FINDINGS: There is a comminuted mildly displaced left femoral head neck
fracture. There is superior displacement of the femoral shaft. The
femoral head still articulates with the acetabulum. There is a
fracture fragment seen along the medial femoral neck. Dense vascular
calcifications are seen. No other fractures identified.
IMPRESSION: Comminuted mildly superiorly displaced left femoral head neck
junction fracture.

## 2020-11-01 IMAGING — DX DG HIP (WITH OR WITHOUT PELVIS) 2-3V*L*
2 series · 2 of 2 positions shown · non-contrast
Comparison: Left hip x-rays from yesterday.

CLINICAL DATA: Left hip replacement.

EXAM:
DG HIP (WITH OR WITHOUT PELVIS) 2-3V LEFT

[hip ap]
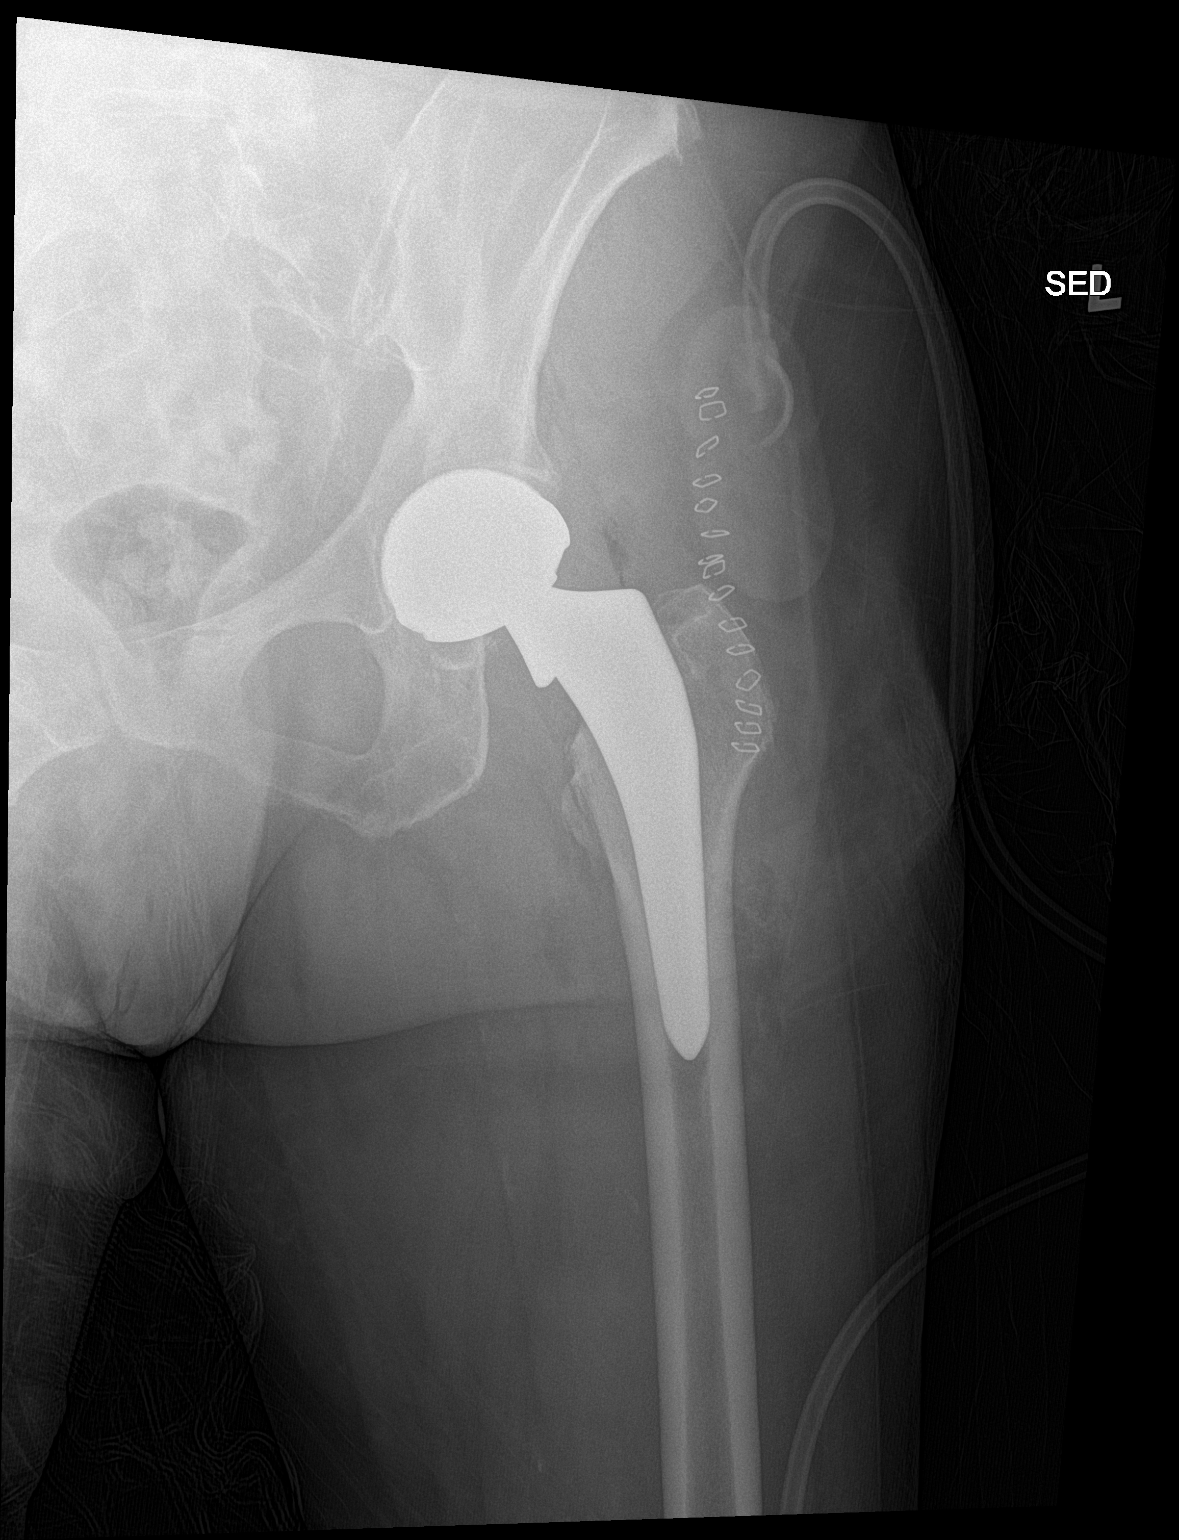

[hip lat]
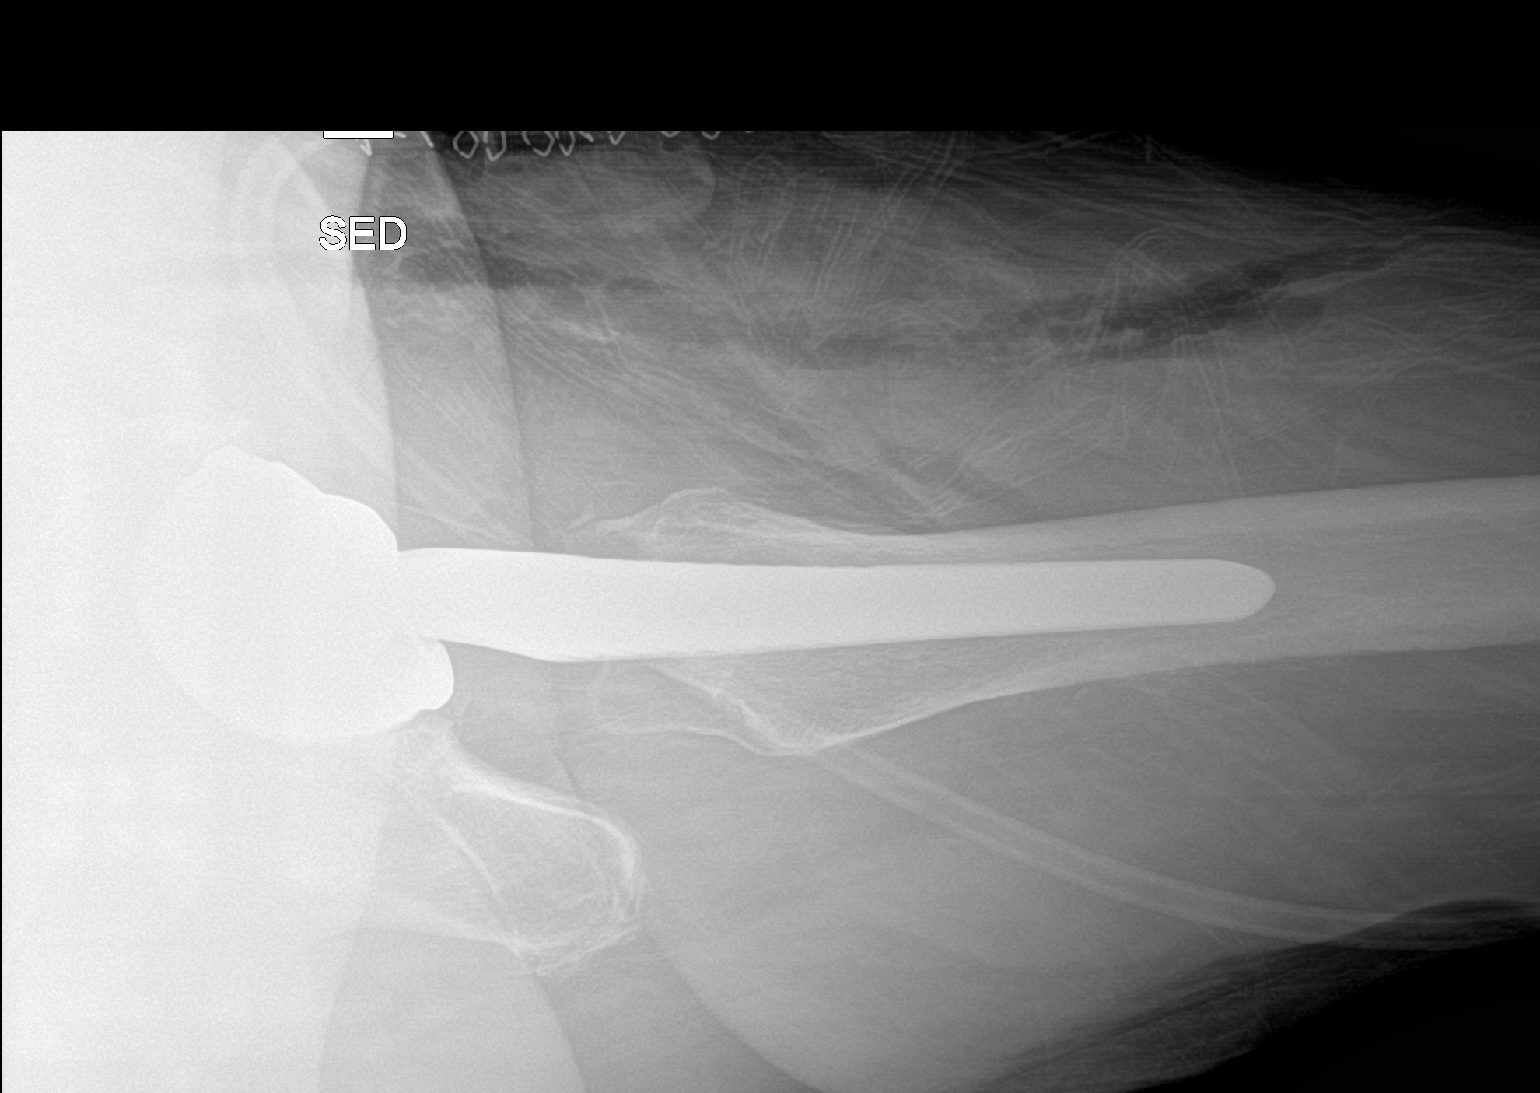

[2 of 2 positions shown; findings below may reference images not displayed]

FINDINGS: The left hip demonstrates a total arthroplasty without evidence of
hardware failure or complication. There is expected intra-articular
air. There is no fracture or dislocation. The alignment is anatomic.
Post-surgical changes noted in the surrounding soft tissues.
Surgical drain in place.
IMPRESSION: 1. Interval left total hip arthroplasty without acute postoperative
complication.

## 2021-02-27 ENCOUNTER — Ambulatory Visit: Admit: 2021-02-27 | Discharge: 2021-02-28 | Payer: MEDICARE | Attending: Dermatology | Primary: Dermatology

## 2021-02-27 DIAGNOSIS — Z79899 Other long term (current) drug therapy: Secondary | ICD-10-CM | POA: Diagnosis not present

## 2021-02-27 DIAGNOSIS — L409 Psoriasis, unspecified: Secondary | ICD-10-CM | POA: Diagnosis not present

## 2021-02-27 MED ORDER — CLOBETASOL 0.05 % SCALP SOLUTION
OPHTHALMIC | 5 refills | 0.00000 days | Status: CP
Start: 2021-02-27 — End: ?

## 2021-02-27 MED ORDER — HUMIRA PEN CITRATE FREE 40 MG/0.4 ML
SUBCUTANEOUS | 6 refills | 0.00000 days | Status: CP
Start: 2021-02-27 — End: ?

## 2021-02-27 MED ORDER — FOLIC ACID 1 MG TABLET
ORAL_TABLET | 3 refills | 0.00000 days | Status: CP
Start: 2021-02-27 — End: ?

## 2021-02-27 MED ORDER — METHOTREXATE SODIUM 2.5 MG TABLET
ORAL_TABLET | 2 refills | 0.00000 days | Status: CP
Start: 2021-02-27 — End: ?

## 2021-03-03 DIAGNOSIS — Z79899 Other long term (current) drug therapy: Principal | ICD-10-CM

## 2021-03-03 DIAGNOSIS — L409 Psoriasis, unspecified: Principal | ICD-10-CM

## 2021-07-09 ENCOUNTER — Ambulatory Visit: Payer: Medicare HMO | Admitting: Dermatology

## 2021-07-29 DIAGNOSIS — R399 Unspecified symptoms and signs involving the genitourinary system: Secondary | ICD-10-CM | POA: Diagnosis not present

## 2021-08-06 DIAGNOSIS — R8290 Unspecified abnormal findings in urine: Secondary | ICD-10-CM | POA: Diagnosis not present

## 2021-08-07 DIAGNOSIS — R81 Glycosuria: Secondary | ICD-10-CM | POA: Diagnosis not present

## 2021-08-11 DIAGNOSIS — F419 Anxiety disorder, unspecified: Secondary | ICD-10-CM | POA: Diagnosis not present

## 2021-08-11 DIAGNOSIS — R81 Glycosuria: Secondary | ICD-10-CM | POA: Diagnosis not present

## 2021-08-11 DIAGNOSIS — R112 Nausea with vomiting, unspecified: Secondary | ICD-10-CM | POA: Diagnosis not present

## 2021-08-11 DIAGNOSIS — Z8639 Personal history of other endocrine, nutritional and metabolic disease: Secondary | ICD-10-CM | POA: Diagnosis not present

## 2021-08-11 DIAGNOSIS — R630 Anorexia: Secondary | ICD-10-CM | POA: Diagnosis not present

## 2021-08-11 DIAGNOSIS — E0520 Thyrotoxicosis with toxic multinodular goiter without thyrotoxic crisis or storm: Secondary | ICD-10-CM | POA: Diagnosis not present

## 2021-11-09 DIAGNOSIS — L989 Disorder of the skin and subcutaneous tissue, unspecified: Secondary | ICD-10-CM | POA: Diagnosis not present

## 2021-11-09 DIAGNOSIS — M069 Rheumatoid arthritis, unspecified: Secondary | ICD-10-CM | POA: Diagnosis not present

## 2021-11-09 DIAGNOSIS — R42 Dizziness and giddiness: Secondary | ICD-10-CM | POA: Diagnosis not present

## 2021-11-09 DIAGNOSIS — R29898 Other symptoms and signs involving the musculoskeletal system: Secondary | ICD-10-CM | POA: Diagnosis not present

## 2022-01-11 DIAGNOSIS — U099 Post covid-19 condition, unspecified: Secondary | ICD-10-CM | POA: Diagnosis not present

## 2022-02-26 ENCOUNTER — Inpatient Hospital Stay (HOSPITAL_COMMUNITY)
Admission: EM | Admit: 2022-02-26 | Discharge: 2022-02-28 | DRG: 065 | Disposition: A | Discharge status: Home Health | Payer: Medicare HMO | Attending: Internal Medicine | Admitting: Internal Medicine

## 2022-02-26 ENCOUNTER — Inpatient Hospital Stay (HOSPITAL_COMMUNITY): Payer: Medicare HMO

## 2022-02-26 ENCOUNTER — Emergency Department (HOSPITAL_COMMUNITY): Payer: Medicare HMO

## 2022-02-26 ENCOUNTER — Encounter (HOSPITAL_COMMUNITY): Payer: Self-pay

## 2022-02-26 ENCOUNTER — Other Ambulatory Visit: Payer: Self-pay

## 2022-02-26 DIAGNOSIS — I6501 Occlusion and stenosis of right vertebral artery: Secondary | ICD-10-CM | POA: Diagnosis not present

## 2022-02-26 DIAGNOSIS — I6340 Cerebral infarction due to embolism of unspecified cerebral artery: Secondary | ICD-10-CM | POA: Diagnosis not present

## 2022-02-26 DIAGNOSIS — R52 Pain, unspecified: Secondary | ICD-10-CM | POA: Diagnosis not present

## 2022-02-26 DIAGNOSIS — G8194 Hemiplegia, unspecified affecting left nondominant side: Secondary | ICD-10-CM | POA: Diagnosis not present

## 2022-02-26 DIAGNOSIS — Z79899 Other long term (current) drug therapy: Secondary | ICD-10-CM

## 2022-02-26 DIAGNOSIS — I6523 Occlusion and stenosis of bilateral carotid arteries: Secondary | ICD-10-CM | POA: Diagnosis not present

## 2022-02-26 DIAGNOSIS — Z7982 Long term (current) use of aspirin: Secondary | ICD-10-CM | POA: Diagnosis not present

## 2022-02-26 DIAGNOSIS — G319 Degenerative disease of nervous system, unspecified: Secondary | ICD-10-CM | POA: Diagnosis not present

## 2022-02-26 DIAGNOSIS — F17210 Nicotine dependence, cigarettes, uncomplicated: Secondary | ICD-10-CM | POA: Diagnosis present

## 2022-02-26 DIAGNOSIS — R29810 Facial weakness: Secondary | ICD-10-CM | POA: Diagnosis not present

## 2022-02-26 DIAGNOSIS — I6389 Other cerebral infarction: Secondary | ICD-10-CM | POA: Diagnosis not present

## 2022-02-26 DIAGNOSIS — Z96642 Presence of left artificial hip joint: Secondary | ICD-10-CM | POA: Diagnosis present

## 2022-02-26 DIAGNOSIS — Z716 Tobacco abuse counseling: Secondary | ICD-10-CM

## 2022-02-26 DIAGNOSIS — Z90710 Acquired absence of both cervix and uterus: Secondary | ICD-10-CM | POA: Diagnosis not present

## 2022-02-26 DIAGNOSIS — Z8616 Personal history of COVID-19: Secondary | ICD-10-CM

## 2022-02-26 DIAGNOSIS — H9190 Unspecified hearing loss, unspecified ear: Secondary | ICD-10-CM | POA: Diagnosis present

## 2022-02-26 DIAGNOSIS — F1210 Cannabis abuse, uncomplicated: Secondary | ICD-10-CM | POA: Diagnosis not present

## 2022-02-26 DIAGNOSIS — I6381 Other cerebral infarction due to occlusion or stenosis of small artery: Secondary | ICD-10-CM | POA: Diagnosis not present

## 2022-02-26 DIAGNOSIS — I6603 Occlusion and stenosis of bilateral middle cerebral arteries: Secondary | ICD-10-CM | POA: Diagnosis not present

## 2022-02-26 DIAGNOSIS — L409 Psoriasis, unspecified: Secondary | ICD-10-CM | POA: Diagnosis not present

## 2022-02-26 DIAGNOSIS — Z7151 Drug abuse counseling and surveillance of drug abuser: Secondary | ICD-10-CM

## 2022-02-26 DIAGNOSIS — I639 Cerebral infarction, unspecified: Secondary | ICD-10-CM | POA: Diagnosis not present

## 2022-02-26 DIAGNOSIS — Z8249 Family history of ischemic heart disease and other diseases of the circulatory system: Secondary | ICD-10-CM | POA: Diagnosis not present

## 2022-02-26 DIAGNOSIS — I10 Essential (primary) hypertension: Secondary | ICD-10-CM | POA: Diagnosis not present

## 2022-02-26 DIAGNOSIS — Z974 Presence of external hearing-aid: Secondary | ICD-10-CM

## 2022-02-26 DIAGNOSIS — Z720 Tobacco use: Secondary | ICD-10-CM | POA: Diagnosis not present

## 2022-02-26 DIAGNOSIS — R29898 Other symptoms and signs involving the musculoskeletal system: Secondary | ICD-10-CM | POA: Diagnosis not present

## 2022-02-26 DIAGNOSIS — E785 Hyperlipidemia, unspecified: Secondary | ICD-10-CM | POA: Diagnosis present

## 2022-02-26 DIAGNOSIS — R2990 Unspecified symptoms and signs involving the nervous system: Secondary | ICD-10-CM | POA: Diagnosis present

## 2022-02-26 DIAGNOSIS — I739 Peripheral vascular disease, unspecified: Secondary | ICD-10-CM

## 2022-02-26 DIAGNOSIS — E7800 Pure hypercholesterolemia, unspecified: Secondary | ICD-10-CM | POA: Diagnosis not present

## 2022-02-26 DIAGNOSIS — M1990 Unspecified osteoarthritis, unspecified site: Secondary | ICD-10-CM | POA: Diagnosis present

## 2022-02-26 DIAGNOSIS — Z8673 Personal history of transient ischemic attack (TIA), and cerebral infarction without residual deficits: Secondary | ICD-10-CM

## 2022-02-26 DIAGNOSIS — R29704 NIHSS score 4: Secondary | ICD-10-CM | POA: Diagnosis not present

## 2022-02-26 DIAGNOSIS — R531 Weakness: Secondary | ICD-10-CM | POA: Diagnosis not present

## 2022-02-26 DIAGNOSIS — Z7902 Long term (current) use of antithrombotics/antiplatelets: Secondary | ICD-10-CM

## 2022-02-26 DIAGNOSIS — I160 Hypertensive urgency: Secondary | ICD-10-CM | POA: Diagnosis present

## 2022-02-26 DIAGNOSIS — R4781 Slurred speech: Secondary | ICD-10-CM | POA: Diagnosis not present

## 2022-02-26 LAB — COMPREHENSIVE METABOLIC PANEL
ALT: 15 U/L (ref 0–44)
AST: 17 U/L (ref 15–41)
Albumin: 3.8 g/dL (ref 3.5–5.0)
Alkaline Phosphatase: 56 U/L (ref 38–126)
Anion gap: 7 (ref 5–15)
BUN: 16 mg/dL (ref 8–23)
CO2: 25 mmol/L (ref 22–32)
Calcium: 9.2 mg/dL (ref 8.9–10.3)
Chloride: 105 mmol/L (ref 98–111)
Creatinine, Ser: 0.93 mg/dL (ref 0.44–1.00)
GFR, Estimated: >60 mL/min (ref >60)
Glucose, Bld: 108 mg/dL — ABNORMAL HIGH (ref 70–99)
Potassium: 4.2 mmol/L (ref 3.5–5.1)
Sodium: 137 mmol/L (ref 135–145)
Total Bilirubin: 0.8 mg/dL (ref 0.3–1.2)
Total Protein: 6.4 g/dL — ABNORMAL LOW (ref 6.5–8.1)

## 2022-02-26 LAB — I-STAT CHEM 8, ED
BUN: 18 mg/dL (ref 8–23)
Calcium, Ion: 1.09 mmol/L — ABNORMAL LOW (ref 1.15–1.40)
Chloride: 102 mmol/L (ref 98–111)
Creatinine, Ser: 0.9 mg/dL (ref 0.44–1.00)
Glucose, Bld: 108 mg/dL — ABNORMAL HIGH (ref 70–99)
HCT: 49 % — ABNORMAL HIGH (ref 36.0–46.0)
Hemoglobin: 16.7 g/dL — ABNORMAL HIGH (ref 12.0–15.0)
Potassium: 4.2 mmol/L (ref 3.5–5.1)
Sodium: 140 mmol/L (ref 135–145)
TCO2: 24 mmol/L (ref 22–32)

## 2022-02-26 LAB — CBC
HCT: 49.2 % — ABNORMAL HIGH (ref 36.0–46.0)
Hemoglobin: 16.6 g/dL — ABNORMAL HIGH (ref 12.0–15.0)
MCH: 33.1 pg (ref 26.0–34.0)
MCHC: 33.7 g/dL (ref 30.0–36.0)
MCV: 98 fL (ref 80.0–100.0)
Platelets: 232 10*3/uL (ref 150–400)
RBC: 5.02 MIL/uL (ref 3.87–5.11)
RDW: 12.6 % (ref 11.5–15.5)
WBC: 8.9 10*3/uL (ref 4.0–10.5)
nRBC: 0 % (ref 0.0–0.2)

## 2022-02-26 LAB — TSH: TSH: 4.963 u[IU]/mL — ABNORMAL HIGH (ref 0.350–4.500)

## 2022-02-26 LAB — DIFFERENTIAL
Abs Immature Granulocytes: 0.03 10*3/uL (ref 0.00–0.07)
Basophils Absolute: 0.1 10*3/uL (ref 0.0–0.1)
Basophils Relative: 1 %
Eosinophils Absolute: 0.4 10*3/uL (ref 0.0–0.5)
Eosinophils Relative: 4 %
Immature Granulocytes: 0 %
Lymphocytes Relative: 25 %
Lymphs Abs: 2.2 10*3/uL (ref 0.7–4.0)
Monocytes Absolute: 0.7 10*3/uL (ref 0.1–1.0)
Monocytes Relative: 7 %
Neutro Abs: 5.5 10*3/uL (ref 1.7–7.7)
Neutrophils Relative %: 63 %

## 2022-02-26 LAB — RAPID URINE DRUG SCREEN, HOSP PERFORMED
Amphetamines: NOT DETECTED
Barbiturates: NOT DETECTED
Benzodiazepines: NOT DETECTED
Cocaine: NOT DETECTED
Opiates: NOT DETECTED
Tetrahydrocannabinol: POSITIVE — AB

## 2022-02-26 LAB — URINALYSIS, ROUTINE W REFLEX MICROSCOPIC
Bilirubin Urine: NEGATIVE
Glucose, UA: NEGATIVE mg/dL
Hgb urine dipstick: NEGATIVE
Ketones, ur: NEGATIVE mg/dL
Leukocytes,Ua: NEGATIVE
Nitrite: NEGATIVE
Protein, ur: NEGATIVE mg/dL
Specific Gravity, Urine: 1.043 — ABNORMAL HIGH (ref 1.005–1.030)
pH: 8 (ref 5.0–8.0)

## 2022-02-26 LAB — CBG MONITORING, ED: Glucose-Capillary: 124 mg/dL — ABNORMAL HIGH (ref 70–99)

## 2022-02-26 LAB — PROTIME-INR
INR: 1 (ref 0.8–1.2)
Prothrombin Time: 13.5 seconds (ref 11.4–15.2)

## 2022-02-26 LAB — HEMOGLOBIN A1C
Hgb A1c MFr Bld: 5.4 % (ref 4.8–5.6)
Mean Plasma Glucose: 108.28 mg/dL

## 2022-02-26 LAB — APTT: aPTT: 27 seconds (ref 24–36)

## 2022-02-26 LAB — ETHANOL: Alcohol, Ethyl (B): <10 mg/dL (ref <10)

## 2022-02-26 LAB — CK: Total CK: 86 U/L (ref 38–234)

## 2022-02-26 MED ORDER — ASPIRIN 325 MG PO TBEC
325.0000 mg | DELAYED_RELEASE_TABLET | Freq: Every day | ORAL | Status: DC
  Administered 2022-02-27 – 2022-02-28 (×2): 325 mg via ORAL
  Filled 2022-02-26 (×3): qty 1

## 2022-02-26 MED ORDER — NICOTINE 21 MG/24HR TD PT24
21.0000 mg | MEDICATED_PATCH | Freq: Every day | TRANSDERMAL | Status: DC
Start: 2022-02-26 — End: 2022-02-28
  Administered 2022-02-26 – 2022-02-28 (×3): 21 mg via TRANSDERMAL
  Filled 2022-02-26 (×3): qty 1

## 2022-02-26 MED ORDER — CLOPIDOGREL BISULFATE 75 MG PO TABS
75.0000 mg | ORAL_TABLET | Freq: Every day | ORAL | Status: DC
  Administered 2022-02-27 – 2022-02-28 (×2): 75 mg via ORAL
  Filled 2022-02-26 (×2): qty 1

## 2022-02-26 MED ORDER — ACETAMINOPHEN 325 MG PO TABS: 650.0000 mg | ORAL_TABLET | ORAL | Status: DC | PRN

## 2022-02-26 MED ORDER — HYDRALAZINE HCL 20 MG/ML IJ SOLN
5.0000 mg | Freq: Three times a day (TID) | INTRAMUSCULAR | Status: DC | PRN
  Administered 2022-02-27: 5 mg via INTRAVENOUS
  Filled 2022-02-26: qty 1

## 2022-02-26 MED ORDER — CLOPIDOGREL BISULFATE 300 MG PO TABS
300.0000 mg | ORAL_TABLET | Freq: Once | ORAL | Status: AC
  Administered 2022-02-26: 300 mg via ORAL
  Filled 2022-02-26: qty 1

## 2022-02-26 MED ORDER — METHOTREXATE 2.5 MG PO TABS: 12.5000 mg | ORAL_TABLET | ORAL | Status: DC

## 2022-02-26 MED ORDER — SODIUM CHLORIDE 0.9 % IV SOLN: INTRAVENOUS | Status: DC

## 2022-02-26 MED ORDER — ASPIRIN 81 MG PO TBEC: 81.0000 mg | DELAYED_RELEASE_TABLET | Freq: Every day | ORAL | Status: DC

## 2022-02-26 MED ORDER — ATORVASTATIN CALCIUM 40 MG PO TABS: 40.0000 mg | ORAL_TABLET | Freq: Every day | ORAL | Status: DC

## 2022-02-26 MED ORDER — ACETAMINOPHEN 650 MG RE SUPP
650.0000 mg | RECTAL | Status: DC | PRN
Start: 2022-02-26 — End: 2022-02-27

## 2022-02-26 MED ORDER — STROKE: EARLY STAGES OF RECOVERY BOOK
Freq: Once | Status: AC
  Filled 2022-02-26: qty 1

## 2022-02-26 MED ORDER — ASPIRIN 325 MG PO TABS
325.0000 mg | ORAL_TABLET | Freq: Once | ORAL | Status: AC
Start: 2022-02-26 — End: 2022-02-26
  Administered 2022-02-26: 325 mg via ORAL
  Filled 2022-02-26: qty 1

## 2022-02-26 MED ORDER — CITALOPRAM HYDROBROMIDE 10 MG PO TABS
20.0000 mg | ORAL_TABLET | Freq: Every day | ORAL | Status: DC
  Administered 2022-02-27 – 2022-02-28 (×2): 20 mg via ORAL
  Filled 2022-02-26 (×2): qty 2

## 2022-02-26 MED ORDER — ACETAMINOPHEN 160 MG/5ML PO SOLN
650.0000 mg | ORAL | Status: DC | PRN
Start: 2022-02-26 — End: 2022-02-27

## 2022-02-26 MED ORDER — IOHEXOL 350 MG/ML SOLN
75.0000 mL | Freq: Once | INTRAVENOUS | Status: AC | PRN
  Administered 2022-02-26: 75 mL via INTRAVENOUS

## 2022-02-26 MED ORDER — LABETALOL HCL 5 MG/ML IV SOLN
INTRAVENOUS | Status: AC
  Filled 2022-02-26: qty 4

## 2022-02-26 MED ORDER — SENNOSIDES-DOCUSATE SODIUM 8.6-50 MG PO TABS: 1.0000 | ORAL_TABLET | Freq: Every evening | ORAL | Status: DC | PRN

## 2022-02-26 NOTE — Assessment & Plan Note (Addendum)
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 Small infarcts w/in the right cerebellar hemisphere are also new since MRI in 12/2018. History of previous CVAs/TIAs. +smoker, HTN/HLD.  -place in progressive on telemetry for CVA work up.  -Neurochecks per protocol -Neurology consulted -MRI brain without contrast ordered -echo -a1c/lipid panel  -Continue daily aspirin 81 mg and start Plavix per neurology (she admits she doesn't always take her ASA)  -Permissive hypertension first 24 hours <220/110 -N.p.o. until bedside swallow screen -PT/ OT/ SLP consult -gentle time limited IVF

## 2022-02-26 NOTE — H&P (Signed)
History and Physical    Patient: Victoria Gutierrez KNL:[id number] DOB: July 31, 1945 DOA: 02/26/2022 DOS: the patient was seen and examined on 02/26/2022 PCP: Maryland Pink, MD  Patient coming from: Home - lives with her daughter. She ambulates independently.    Chief Complaint: stroke like symptoms.   HPI: Victoria Gutierrez is a 76 y.o. female with medical history significant of HTN, HLD, hx of CVA, psoriasis who presented with stroke like symptoms. On Sunday she went to a birthday celebration and complained of weakness and body aches and not feeling well. Wednesday night her daughter talked to her and sounded like she had labored breathing. She told her she felt like her legs were weak and her body was weak. She advised her sister to take her blood pressure and then didn't hear from either of them. Thursday morning her blood pressure was running high and they discussed with PCP. Today around 12:30-1:00 she had an episode of left sided facial drooping and slurred speech. Her speech is better, but she has not talked to her daughter much. She also doesn't have her hearing aides in and can't hear well. She also still feels weak in her legs. Had covid July 1st.   Denies any fever/chills, vision changes/headaches, chest pain or palpitations, shortness of breath or cough, abdominal pain, N/V/D, dysuria or leg swelling. She does complain of pain in legs/cramping, can be worse with walking and cold feet. Denies any color change to feet.    She smokes 1PPD and does not drink alcohol. No family hx of CVAs in her family.    ER Course:  vitals: afebrile, bp: 231/108, HR: 78, RR: 18, oxygen: 97%RA Pertinent labs: hgb: 16.6,  CT head: acute/subacute infarct of right caudate and lentiform nuclei as well as right internal capsule. Small infarcts w/in right cerebellar hemisphere new from MRI in 12/2018.  CTA head: pending  MRI ordered  In ED: given ASA and plavix, code stroke called, neuro consulted. TR[facility] asked to admit.      Review of Systems: As mentioned in the history of present illness. All other systems reviewed and are negative. Past Medical History:  Diagnosis Date   Arthritis    knees, hands   H/O thyroid disease    No issues currently   HOH (hard of hearing)    Hypertension    Stroke ([place]) 12/2018   Wears hearing aid in both ears    has, does not wear   Past Surgical History:  Procedure Laterality Date   ABDOMINAL HYSTERECTOMY     CATARACT EXTRACTION W/PHACO Right 09/18/2019   Procedure: CATARACT EXTRACTION PHACO AND INTRAOCULAR LENS PLACEMENT (IOC) RIGHT 11.65  01:03.3;  Surgeon: Birder Robson, MD;  Location: [place];  Service: Ophthalmology;  Laterality: Right;   CATARACT EXTRACTION W/PHACO Left 10/09/2019   Procedure: CATARACT EXTRACTION PHACO AND INTRAOCULAR LENS PLACEMENT (IOC) LEFT 10.30  00:48.5;  Surgeon: Birder Robson, MD;  Location: [place];  Service: Ophthalmology;  Laterality: Left;   TOTAL HIP ARTHROPLASTY Left 04/05/2019   Procedure: TOTAL HIP ARTHROPLASTY ANTERIOR APPROACH;  Surgeon: Hessie Knows, MD;  Location: [facility];  Service: Orthopedics;  Laterality: Left;   Social History:  reports that she has been smoking cigarettes. She started smoking about 34 years ago. She has a 19.00 pack-year smoking history. She has never used smokeless tobacco. She reports that she does not drink alcohol and does not use drugs.  No Known Allergies  Family History  Problem Relation Age of Onset  Heart disease Mother    Heart disease Father    Breast cancer Neg Hx     Prior to Admission medications   Medication Sig Start Date End Date Taking? Authorizing Provider  aspirin EC 81 MG EC tablet Take 1 tablet (81 mg total) by mouth daily. 01/23/19   Mariel Aloe, MD  atorvastatin (LIPITOR) 40 MG tablet Take 1 tablet (40 mg total) by mouth daily at 6 PM. Patient not taking: Reported on 09/13/2019 01/22/19   Mariel Aloe, MD  citalopram (CELEXA) 20 MG tablet  Take 20 mg by mouth daily.    [provider]  hydrochlorothiazide (MICROZIDE) 12.5 MG capsule Take 12.5 mg by mouth daily.    [provider]  ibuprofen (ADVIL) 400 MG tablet Take 1 tablet (400 mg total) by mouth every 6 (six) hours as needed. Patient not taking: Reported on 09/13/2019 04/09/19   Max Sane, MD  lisinopril (ZESTRIL) 40 MG tablet Take 40 mg by mouth daily.     [provider]  Melatonin 5 MG TBDP Take 5 mg by mouth at bedtime.  05/12/16   [provider]  methotrexate (RHEUMATREX) 2.5 MG tablet Take 12.5 mg by mouth every Sunday.  02/22/18   [provider]    Physical Exam: Vitals:   02/26/22 1415 02/26/22 1549 02/26/22 1735 02/26/22 1752  BP:  (!) 204/107 (!) 197/120 (!) 168/118  Pulse:  76 71 80  Resp:  (!) '24 20 20  '$ Temp: 98.5 F (36.9 C)  98.8 F (37.1 C) 98.8 F (37.1 C)  TempSrc: Oral  Oral Oral  SpO2:  96% 97% 100%  Weight:      Height:       General:  Appears calm and comfortable and is in NAD Eyes:  PERRL, EOMI, normal lids, iris ENT:  HOH, lips & tongue, mmm; appropriate dentition Neck:  no LAD, masses or thyromegaly; no carotid bruits Cardiovascular:  RRR, no m/r/g. No LE edema.  Respiratory:   CTA bilaterally with no wheezes/rales/rhonchi.  Normal respiratory effort. Abdomen:  soft, NT, ND, NABS Back:   normal alignment, no CVAT Skin:  flaky/psoriasis on scalp  Musculoskeletal:  normal tone/strength of right UE/LE. Left upper 4/5 and lower extremity 3-4/5. DTR wnl.  Lower extremity:  No LE edema.  Limited foot exam with no ulcerations. PT pulses thready to absent bilaterally. DP 2+  Psychiatric:  grossly normal mood and affect, speech fluent and appropriate, AOx3 Neurologic:  CN 2-12 grossly intact except for very mild left facial droop, moves all extremities in coordinated fashion, sensation intact. HTK intact bilaterally. FTN intact bilaterally. Gait deferred    Radiological Exams on  Admission: Independently reviewed - see discussion in A/P where applicable  MR BRAIN WO CONTRAST  Result Date: 02/26/2022 CLINICAL DATA:  Transient ischemic attack (TIA). Left-sided weakness and facial droop. EXAM: MRI HEAD WITHOUT CONTRAST TECHNIQUE: Multiplanar, multiecho pulse sequences of the brain and surrounding structures were obtained without intravenous contrast. COMPARISON:  Head CT and CTA 02/26/2022.  Head MRI 01/20/2019. FINDINGS: The study is intermittently up to moderately motion degraded. Brain: There is a 1.2 cm acute infarct in the right ventral pons, and there is a 2.2 cm acute infarct anteriorly in the right basal ganglia involving the caudate and lentiform nuclei and intervening white matter. A chronic infarct is noted in the left basal ganglia with associated chronic blood products and ex vacuo dilatation of the left lateral ventricle. Patchy and confluent T2 hyperintensities elsewhere in the  cerebral white matter bilaterally and in the pons are similar to the prior MRI and are nonspecific but compatible with severe chronic small vessel ischemic disease. There are chronic lacunar infarcts in the cerebral white matter bilaterally and in the right cerebellar hemisphere with the latter being new from 2020. Wallerian degeneration involves the anterior body of the corpus callosum. There is mild cerebral atrophy. No mass, midline shift, or extra-axial fluid collection is identified. Vascular: Major intracranial vascular flow voids are preserved. Skull and upper cervical spine: No suspicious marrow lesion. Sinuses/Orbits: Bilateral cataract extraction. Large mucous retention cyst in the left maxillary sinus. Mild mucosal thickening in the right maxillary sinus. Clear mastoid air cells. Other: None. IMPRESSION: 1. 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 2. Severe chronic small vessel ischemic disease. Electronically Signed   By: Logan Bores M.D.   On: 02/26/2022 17:33   CT  ANGIO HEAD NECK W WO CM (CODE STROKE)  Result Date: 02/26/2022 CLINICAL DATA:  Code stroke EXAM: CT ANGIOGRAPHY HEAD AND NECK TECHNIQUE: Multidetector CT imaging of the head and neck was performed using the standard protocol during bolus administration of intravenous contrast. Multiplanar CT image reconstructions and MIPs were obtained to evaluate the vascular anatomy. Carotid stenosis measurements (when applicable) are obtained utilizing NASCET criteria, using the distal internal carotid diameter as the denominator. RADIATION DOSE REDUCTION: This exam was performed according to the departmental dose-optimization program which includes automated exposure control, adjustment of the mA and/or kV according to patient size and/or use of iterative reconstruction technique. CONTRAST:  36m OMNIPAQUE IOHEXOL 350 MG/ML SOLN COMPARISON:  Same-day noncontrast CT head, CTA head and neck 01/20/2019 FINDINGS: CTA NECK FINDINGS Aortic arch: There is mild calcified plaque in the imaged aortic arch. The origins of the major branch vessels are patent with mild stenosis at the origin of the subclavian artery. The subclavian arteries are otherwise patent to the level imaged. Right carotid system: The right common carotid artery is patent. There is mixed plaque in the proximal right internal carotid artery resulting in up to approximately 50% stenosis. The distal right internal carotid artery is patent. The right external carotid artery is patent. There is no dissection or aneurysm. Left carotid system: The left common carotid artery is patent. There is mixed plaque in the proximal internal carotid artery resulting in up to approximately 40-50% stenosis. The distal left internal carotid artery is patent. The external carotid artery is patent. There is no dissection or aneurysm. Vertebral arteries: There is moderate stenosis of the origin of the right vertebral artery. The right vertebral artery is otherwise patent with mild plaque at  the V3 segment. The non dominant left vertebral artery is patent without hemodynamically significant stenosis or occlusion. There is no dissection or aneurysm. Skeleton: There is no acute osseous abnormality or suspicious osseous lesion. Other neck: A right thyroid nodule is seen as seen on prior ultrasound. The soft tissues of the neck are otherwise unremarkable. Upper chest: The imaged lung apices are clear. Review of the MIP images confirms the above findings CTA HEAD FINDINGS Anterior circulation: There is calcified plaque in the intracranial ICAs resulting in mild stenosis of the right paraclinoid segment and no significant stenosis on the left. The right M1 and proximal M2 segments are patent. There is severe stenosis a distal right M2/proximal M3 branch in the distal sylvian fissure (8-101, 12-11) and additional severe stenosis of the same branch distally as it courses laterally over the cortex (8-108, 9-45), new since  2020. The left M1 segment is occluded proximally. There is reconstitution of flow in the M2 branches which are overall decreased in caliber compared to the right. 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 The ACAs are otherwise patent proximally. There is multifocal severe stenosis/occlusion of the distal right ACA (for example 7-72), worsened since 2020 There is no aneurysm or AVM. Posterior circulation: There is a PICA termination of the left vertebral artery. There is dense calcification of the proximal right V4 segment resulting in moderate stenosis. The basilar artery is diminutive distally and terminates at the superior cerebellar arteries, unchanged. The bilateral PCAs are patent with fetal origins bilaterally. There is no proximal stenosis or occlusion. There is no aneurysm or AVM. Venous sinuses: Patent. Anatomic variants: As above. Review of the MIP images confirms the above findings IMPRESSION: 1. No emergent large vessel  occlusion. 2. 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 3. 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 4. Unchanged proximal left M1 occlusion with reconstitution but overall diminished caliber of the distal left MCA branches compared to the right. 5. Unchanged moderate stenosis of the right V4 segment and multifocal irregularity of the basilar artery which terminates at the superior cerebellar arteries. Fetal PCAs bilaterally are patent. 6. Unchanged moderate stenosis of the dominant right vertebral artery origin. 7. Calcified plaque at the carotid bifurcations resulting in approximately 50% stenosis on the right and 40-50% stenosis on the left. Electronically Signed   By: Valetta Mole M.D.   On: 02/26/2022 15:02   CT HEAD CODE STROKE WO CONTRAST  Result Date: 02/26/2022 CLINICAL DATA:  Provided history: Neuro deficit, acute, stroke suspected. Code stroke. EXAM: CT HEAD WITHOUT CONTRAST TECHNIQUE: Contiguous axial images were obtained from the base of the skull through the vertex without intravenous contrast. RADIATION DOSE REDUCTION: This exam was performed according to the departmental dose-optimization program which includes automated exposure control, adjustment of the mA and/or kV according to patient size and/or use of iterative reconstruction technique. COMPARISON:  Brain MRI 01/20/2019. FINDINGS: Brain: Generalized cerebral atrophy. Abnormal hypodensity within portions of the right caudate and lentiform nuclei, as well as anterior limb of right internal capsule. Findings are compatible with an acute/early subacute infarction. Redemonstrated chronic lacunar infarct within the left caudate nucleus. Advanced patchy and ill-defined hypoattenuation within the cerebral white matter, nonspecific but compatible with chronic small vessel ischemic disease. Small infarcts within the right cerebellar hemisphere, new from the prior  brain MRI of 01/20/2019 but otherwise age-indeterminate. There is no acute intracranial hemorrhage. No demarcated cortical infarct is identified. No extra-axial fluid collection. No evidence of an intracranial mass. No midline shift. Vascular: No hyperdense vessel. Atherosclerotic calcifications. Skull: No fracture or aggressive osseous lesion. Sinuses/Orbits: No mass or acute finding within the imaged orbits. Mild mucosal thickening within the right maxillary sinus. 3.3 cm mucous retention cyst within the left maxillary sinus. Small mucous retention cyst, and background trace mucosal thickening, within the right sphenoid sinus. ASPECTS ([place] Stroke Program Early CT Score) - Ganglionic level infarction (caudate, lentiform nuclei, internal capsule, insula, M1-M3 cortex): 4 - Supraganglionic infarction (M4-M6 cortex): 3 Total score (0-10 with 10 being normal): 7 Pertinent results were called by telephone at the time of interpretation on 02/26/2022 at 2:10 pm to provider Dr. Leonel Ramsay, who verbally acknowledged these results. IMPRESSION: 1. Acute/subacute infarct affecting portions of the right caudate and lentiform nuclei, as well as anterior limb  of right internal capsule. ASPECTS is 7. 2. Small infarcts within the right cerebellar hemisphere, new from the prior brain MRI of 01/20/2019 but otherwise age-indeterminate. 3. Advanced chronic small vessel ischemic changes within the cerebral white matter. 4. Cerebral atrophy. 5. Paranasal sinus disease, as described. Electronically Signed   By: Kellie Simmering D.O.   On: 02/26/2022 14:30    EKG: Independently reviewed.  NSR with rate 77; nonspecific ST changes with no evidence of acute ischemia, RBBB   Labs on Admission: I have personally reviewed the available labs and imaging studies at the time of the admission.  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 Small infarcts w/in the right cerebellar hemisphere are also new since MRI in 12/2018. History of previous CVAs/TIAs. +smoker, HTN/HLD.  -place in progressive on telemetry for CVA work up.  -Neurochecks per protocol -Neurology consulted -MRI brain without contrast ordered -echo -a1c/lipid panel  -Continue daily aspirin 81 mg and start Plavix per neurology (she admits she doesn't always take her ASA)  -Permissive hypertension first 24 hours <220/110 -N.p.o. until bedside swallow screen -PT/ OT/ SLP consult -gentle time limited IVF    Hypertension Allow for permissive HTN in setting of possible CVA until MRI results   Hyperlipidemia Has not been taking her statin. 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 Observation status is judged to be reasonable and necessary in order to provide the required intensity of service to ensure the patient's safety. The patient's presenting symptoms, physical exam findings, and initial radiographic and laboratory data in the context of their medical condition is felt to place them at decreased risk for further clinical deterioration. Furthermore, it is  anticipated that the patient will be medically stable for discharge from the hospital within 2 midnights of admission.   Author: Orma Flaming, MD 02/26/2022 6:31 PM  For on call review [email or web address].

## 2022-02-26 NOTE — Assessment & Plan Note (Signed)
Allow for permissive HTN in setting of possible CVA until MRI results

## 2022-02-26 NOTE — Assessment & Plan Note (Addendum)
Check ABI Check CK/TSH  Continue ASA/lipitor

## 2022-02-26 NOTE — ED Provider Notes (Signed)
Emergency Department Provider Note   I have reviewed the triage vital signs and the nursing notes.   HISTORY  Chief Complaint Code Stroke   HPI Victoria Gutierrez is a 76 y.o. female  with PMH reviewed presents to the ED as a code CVA with left face droop and speech change. Patient reports feeling generally week since Saturday but no clear history of unilateral symptoms. Patient notes some elevated BP at this time as well. Patient's daughter noted left face/arm weakness and speech change which ultimately prompted EMS call.    Past Medical History:  Diagnosis Date   Arthritis    knees, hands   H/O thyroid disease    No issues currently   HOH (hard of hearing)    Hypertension    Stroke ([place]) 12/2018   Wears hearing aid in both ears    has, does not wear    Review of Systems  Constitutional: No fever/chills Cardiovascular: Denies chest pain. Respiratory: Denies shortness of breath. Gastrointestinal: No abdominal pain.  No nausea, no vomiting.  No diarrhea.  No constipation. Genitourinary: Negative for dysuria. Musculoskeletal: Negative for back pain. Skin: Negative for rash. Neurological: Negative for headaches. Positive left face and arm weakness with speech change.   ____________________________________________   PHYSICAL EXAM:  VITAL SIGNS: ED Triage Vitals  Enc Vitals Group     BP 02/26/22 1355 (!) 231/108     Pulse Rate 02/26/22 1355 78     Resp 02/26/22 1402 18     Temp 02/26/22 1415 98.5 F (36.9 C)     Temp Source 02/26/22 1415 Oral     SpO2 02/26/22 1355 97 %     Weight 02/26/22 1300 148 lb 9.4 oz (67.4 kg)     Height 02/26/22 1404 '5\' 6"'$  (1.676 m)   Constitutional: Alert and oriented. Well appearing and in no acute distress. Eyes: Conjunctivae are normal.  Head: Atraumatic. Nose: No congestion/rhinnorhea. Mouth/Throat: Mucous membranes are moist. Neck: No stridor.   Cardiovascular: Normal rate, regular rhythm. Good peripheral circulation. Grossly  normal heart sounds.   Respiratory: Normal respiratory effort.  No retractions. Lungs CTAB. Gastrointestinal: Soft and nontender. No distention.  Musculoskeletal: No lower extremity tenderness nor edema. No gross deformities of extremities. Neurologic:  Normal speech and language. Left upper and lower extremity weakness with mild left face droop.  Skin:  Skin is warm, dry and intact. No rash noted.  ____________________________________________   LABS (all labs ordered are listed, but only abnormal results are displayed)  Labs Reviewed  CBC - Abnormal; Notable for the following components:      Result Value   Hemoglobin 16.6 (*)    HCT 49.2 (*)    All other components within normal limits  COMPREHENSIVE METABOLIC PANEL - Abnormal; Notable for the following components:   Glucose, Bld 108 (*)    Total Protein 6.4 (*)    All other components within normal limits  RAPID URINE DRUG SCREEN, HOSP PERFORMED - Abnormal; Notable for the following components:   Tetrahydrocannabinol POSITIVE (*)    All other components within normal limits  URINALYSIS, ROUTINE W REFLEX MICROSCOPIC - Abnormal; Notable for the following components:   Specific Gravity, Urine 1.043 (*)    All other components within normal limits  TSH - Abnormal; Notable for the following components:   TSH 4.963 (*)    All other components within normal limits  I-STAT CHEM 8, ED - Abnormal; Notable for the following components:   Glucose, Bld 108 (*)  Calcium, Ion 1.09 (*)    Hemoglobin 16.7 (*)    HCT 49.0 (*)    All other components within normal limits  CBG MONITORING, ED - Abnormal; Notable for the following components:   Glucose-Capillary 124 (*)    All other components within normal limits  RESP PANEL BY RT-PCR (FLU A&B, COVID) ARPGX2  ETHANOL  DIFFERENTIAL  PROTIME-INR  APTT  CK  HEMOGLOBIN A1C  LIPID PANEL   ____________________________________________  EKG   EKG Interpretation  Date/Time:  Friday  February 26 2022 14:14:39 EDT Ventricular Rate:  77 PR Interval:  175 QRS Duration: 146 QT Interval:  428 QTC Calculation: 485 R Axis:   -1 Text Interpretation: Sinus rhythm Right bundle branch block Left ventricular hypertrophy Inferior infarct, old Anterolateral infarct, age indeterminate Confirmed by Nanda Quinton [phone]) on 02/26/2022 5:10:13 PM        ____________________________________________  RADIOLOGY  CT ANGIO HEAD NECK W WO CM (CODE STROKE)  Result Date: 02/26/2022 CLINICAL DATA:  Code stroke EXAM: CT ANGIOGRAPHY HEAD AND NECK TECHNIQUE: Multidetector CT imaging of the head and neck was performed using the standard protocol during bolus administration of intravenous contrast. Multiplanar CT image reconstructions and MIPs were obtained to evaluate the vascular anatomy. Carotid stenosis measurements (when applicable) are obtained utilizing NASCET criteria, using the distal internal carotid diameter as the denominator. RADIATION DOSE REDUCTION: This exam was performed according to the departmental dose-optimization program which includes automated exposure control, adjustment of the mA and/or kV according to patient size and/or use of iterative reconstruction technique. CONTRAST:  39m OMNIPAQUE IOHEXOL 350 MG/ML SOLN COMPARISON:  Same-day noncontrast CT head, CTA head and neck 01/20/2019 FINDINGS: CTA NECK FINDINGS Aortic arch: There is mild calcified plaque in the imaged aortic arch. The origins of the major branch vessels are patent with mild stenosis at the origin of the subclavian artery. The subclavian arteries are otherwise patent to the level imaged. Right carotid system: The right common carotid artery is patent. There is mixed plaque in the proximal right internal carotid artery resulting in up to approximately 50% stenosis. The distal right internal carotid artery is patent. The right external carotid artery is patent. There is no dissection or aneurysm. Left carotid system: The  left common carotid artery is patent. There is mixed plaque in the proximal internal carotid artery resulting in up to approximately 40-50% stenosis. The distal left internal carotid artery is patent. The external carotid artery is patent. There is no dissection or aneurysm. Vertebral arteries: There is moderate stenosis of the origin of the right vertebral artery. The right vertebral artery is otherwise patent with mild plaque at the V3 segment. The non dominant left vertebral artery is patent without hemodynamically significant stenosis or occlusion. There is no dissection or aneurysm. Skeleton: There is no acute osseous abnormality or suspicious osseous lesion. Other neck: A right thyroid nodule is seen as seen on prior ultrasound. The soft tissues of the neck are otherwise unremarkable. Upper chest: The imaged lung apices are clear. Review of the MIP images confirms the above findings CTA HEAD FINDINGS Anterior circulation: There is calcified plaque in the intracranial ICAs resulting in mild stenosis of the right paraclinoid segment and no significant stenosis on the left. The right M1 and proximal M2 segments are patent. There is severe stenosis a distal right M2/proximal M3 branch in the distal sylvian fissure (8-101, 12-11) and additional severe stenosis of the same branch distally as it courses laterally over the cortex (8-108, 9-45), new since  2020. The left M1 segment is occluded proximally. There is reconstitution of flow in the M2 branches which are overall decreased in caliber compared to the right. 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 The ACAs are otherwise patent proximally. There is multifocal severe stenosis/occlusion of the distal right ACA (for example 7-72), worsened since 2020 There is no aneurysm or AVM. Posterior circulation: There is a PICA termination of the left vertebral artery. There is dense calcification of the proximal right  V4 segment resulting in moderate stenosis. The basilar artery is diminutive distally and terminates at the superior cerebellar arteries, unchanged. The bilateral PCAs are patent with fetal origins bilaterally. There is no proximal stenosis or occlusion. There is no aneurysm or AVM. Venous sinuses: Patent. Anatomic variants: As above. Review of the MIP images confirms the above findings IMPRESSION: 1. No emergent large vessel occlusion. 2. 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 3. 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 4. Unchanged proximal left M1 occlusion with reconstitution but overall diminished caliber of the distal left MCA branches compared to the right. 5. Unchanged moderate stenosis of the right V4 segment and multifocal irregularity of the basilar artery which terminates at the superior cerebellar arteries. Fetal PCAs bilaterally are patent. 6. Unchanged moderate stenosis of the dominant right vertebral artery origin. 7. Calcified plaque at the carotid bifurcations resulting in approximately 50% stenosis on the right and 40-50% stenosis on the left. Electronically Signed   By: Valetta Mole M.D.   On: 02/26/2022 15:02   CT HEAD CODE STROKE WO CONTRAST  Result Date: 02/26/2022 CLINICAL DATA:  Provided history: Neuro deficit, acute, stroke suspected. Code stroke. EXAM: CT HEAD WITHOUT CONTRAST TECHNIQUE: Contiguous axial images were obtained from the base of the skull through the vertex without intravenous contrast. RADIATION DOSE REDUCTION: This exam was performed according to the departmental dose-optimization program which includes automated exposure control, adjustment of the mA and/or kV according to patient size and/or use of iterative reconstruction technique. COMPARISON:  Brain MRI 01/20/2019. FINDINGS: Brain: Generalized cerebral atrophy. Abnormal hypodensity within portions of the right caudate and  lentiform nuclei, as well as anterior limb of right internal capsule. Findings are compatible with an acute/early subacute infarction. Redemonstrated chronic lacunar infarct within the left caudate nucleus. Advanced patchy and ill-defined hypoattenuation within the cerebral white matter, nonspecific but compatible with chronic small vessel ischemic disease. Small infarcts within the right cerebellar hemisphere, new from the prior brain MRI of 01/20/2019 but otherwise age-indeterminate. There is no acute intracranial hemorrhage. No demarcated cortical infarct is identified. No extra-axial fluid collection. No evidence of an intracranial mass. No midline shift. Vascular: No hyperdense vessel. Atherosclerotic calcifications. Skull: No fracture or aggressive osseous lesion. Sinuses/Orbits: No mass or acute finding within the imaged orbits. Mild mucosal thickening within the right maxillary sinus. 3.3 cm mucous retention cyst within the left maxillary sinus. Small mucous retention cyst, and background trace mucosal thickening, within the right sphenoid sinus. ASPECTS ([place] Stroke Program Early CT Score) - Ganglionic level infarction (caudate, lentiform nuclei, internal capsule, insula, M1-M3 cortex): 4 - Supraganglionic infarction (M4-M6 cortex): 3 Total score (0-10 with 10 being normal): 7 Pertinent results were called by telephone at the time of interpretation on 02/26/2022 at 2:10 pm to provider Dr. Leonel Ramsay, who verbally acknowledged these results. IMPRESSION: 1. Acute/subacute infarct affecting portions of the right caudate and lentiform nuclei, as well as anterior limb  of right internal capsule. ASPECTS is 7. 2. Small infarcts within the right cerebellar hemisphere, new from the prior brain MRI of 01/20/2019 but otherwise age-indeterminate. 3. Advanced chronic small vessel ischemic changes within the cerebral white matter. 4. Cerebral atrophy. 5. Paranasal sinus disease, as described. Electronically Signed    By: Kellie Simmering D.O.   On: 02/26/2022 14:30    ____________________________________________   PROCEDURES  Procedure(s) performed:   .Critical Care  Performed by: Margette Fast, MD Authorized by: Margette Fast, MD   Critical care provider statement:    Critical care time (minutes):  35   Critical care time was exclusive of:  Separately billable procedures and treating other patients and teaching time   Critical care was necessary to treat or prevent imminent or life-threatening deterioration of the following conditions:  CNS failure or compromise   Critical care was time spent personally by me on the following activities:  Development of treatment plan with patient or surrogate, discussions with consultants, evaluation of patient's response to treatment, examination of patient, ordering and review of laboratory studies, ordering and review of radiographic studies, ordering and performing treatments and interventions, pulse oximetry, re-evaluation of patient's condition and review of old charts   I assumed direction of critical care for this patient from another provider in my specialty: no     Care discussed with: admitting provider      ____________________________________________   INITIAL IMPRESSION / [place] / ED COURSE  Pertinent labs & imaging results that were available during my care of the patient were reviewed by me and considered in my medical decision making (see chart for details).   This patient is Presenting for Evaluation of CVA symptoms, which does require a range of treatment options, and is a complaint that involves a high risk of morbidity and mortality.  The Differential Diagnoses includes but is not exclusive to alcohol, illicit or prescription medications, intracranial pathology such as stroke, intracerebral hemorrhage, fever or infectious causes including sepsis, hypoxemia, uremia, trauma, endocrine related disorders such as diabetes,  hypoglycemia, thyroid-related diseases, etc.   Critical Interventions-    Medications  labetalol (NORMODYNE) 5 MG/ML injection (has no administration in time range)  clopidogrel (PLAVIX) tablet 75 mg (has no administration in time range)  aspirin EC tablet 81 mg (has no administration in time range)  nicotine (NICODERM CQ - dosed in mg/24 hours) patch 21 mg (21 mg Transdermal Patch Applied 02/26/22 1519)   stroke: early stages of recovery book (has no administration in time range)  acetaminophen (TYLENOL) tablet 650 mg (has no administration in time range)    Or  acetaminophen (TYLENOL) 160 MG/5ML solution 650 mg (has no administration in time range)    Or  acetaminophen (TYLENOL) suppository 650 mg (has no administration in time range)  senna-docusate (Senokot-S) tablet 1 tablet (has no administration in time range)  0.9 %  sodium chloride infusion (has no administration in time range)  hydrALAZINE (APRESOLINE) injection 5 mg (has no administration in time range)  iohexol (OMNIPAQUE) 350 MG/ML injection 75 mL (75 mLs Intravenous Contrast Given 02/26/22 1407)  aspirin tablet 325 mg (325 mg Oral Given 02/26/22 1518)  clopidogrel (PLAVIX) tablet 300 mg (300 mg Oral Given 02/26/22 1518)    Reassessment after intervention: Patient's symptoms improved since arrival.    I did obtain Additional Historical Information from EMS.  I decided to review pertinent External Data, and in summary prior CVA.   Clinical Laboratory Tests Ordered, included no AKI.  EtOH negative. No UTI. No Anemia.   Radiologic Tests Ordered, included CT head. I independently interpreted the images and agree with radiology interpretation.   Cardiac Monitor Tracing which shows NSR.   Social Determinants of Health Risk patient with a smoking history.   Consult complete with Neurology, Dr. Leonel Ramsay. Patient not a TNK candidate. Will need admit for CVA w/u.   Hospitalist. Plan for admit.   Medical Decision Making:  Summary:  Patient arrives as a code stroke. No TNK after symptom timing in questioned, although considered. Plan for admit.   Reevaluation with update and discussion with patient. Updated on imaging and plan for admit.   Disposition: admit  ____________________________________________  FINAL CLINICAL IMPRESSION(S) / ED DIAGNOSES  Final diagnoses:  Cerebrovascular accident (CVA), unspecified mechanism ([place])     Note:  This document was prepared using Dragon voice recognition software and may include unintentional dictation errors.  Nanda Quinton, MD, [facility] Emergency Medicine    Taras Rask, [facility], MD 02/26/22 [phone]

## 2022-02-26 NOTE — Assessment & Plan Note (Signed)
Long history of smoking Nicotine patch, encouraged to quit

## 2022-02-26 NOTE — Consult Note (Signed)
Neurology Consultation  Reason for Consult: Code Stroke Referring Physician: Long  CC: Left Side Weakness, left facial droop  History is obtained from:Patient, chart review Daughter Nira Conn- [phone]  HPI: Victoria Gutierrez is a 76 y.o. female with a past medical history of arthritis, thyroid disease, hearing loss, previous strokes presenting with left facial droop, left side weakness. She states that she has not been feeling well since Saturday and reports generalized weakness starting at that time. This morning her daughter noticed a left facial droop and left side weakness and proceeded to call EMS. The patient herself has not noticed the facial droop or a change in her weakness since Saturday. She does state that she did take her medications this morning including her lisinopril. BP with EMS 170/81 and then 231/108 in CT. At baseline she does have some bilateral lower extremity weakness and degenerative disc disease that she has been seeing a physician at [facility] for.   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 Hypoplastic basilar artery with distal narrowing. Fetal type posterior cerebral arteries are present   LKW: Saturday tpa given?: no, outside of window; changes on CT Premorbid modified Rankin scale (mRS): 2  ROS: Full ROS was performed and is negative except as noted in the HPI.   Past Medical History:  Diagnosis Date   Arthritis    knees, hands   H/O thyroid disease    No issues currently   HOH (hard of hearing)    Hypertension    Stroke ([place]) 12/2018   Wears hearing aid in both ears    has, does not wear    Family History  Problem Relation Age of Onset   Heart disease Mother    Heart disease Father    Breast cancer Neg Hx      Social History:   reports that she has  been smoking cigarettes. She started smoking about 34 years ago. She has a 19.00 pack-year smoking history. She has never used smokeless tobacco. She reports that she does not drink alcohol and does not use drugs.  Medications No current facility-administered medications for this encounter.  Current Outpatient Medications:    aspirin EC 81 MG EC tablet, Take 1 tablet (81 mg total) by mouth daily., Disp: 30 tablet, Rfl: 0   atorvastatin (LIPITOR) 40 MG tablet, Take 1 tablet (40 mg total) by mouth daily at 6 PM. (Patient not taking: Reported on 09/13/2019), Disp: 30 tablet, Rfl: 0   citalopram (CELEXA) 20 MG tablet, Take 20 mg by mouth daily., Disp: , Rfl:    hydrochlorothiazide (MICROZIDE) 12.5 MG capsule, Take 12.5 mg by mouth daily., Disp: , Rfl:    ibuprofen (ADVIL) 400 MG tablet, Take 1 tablet (400 mg total) by mouth every 6 (six) hours as needed. (Patient not taking: Reported on 09/13/2019), Disp: 30 tablet, Rfl: 0   lisinopril (ZESTRIL) 40 MG tablet, Take 40 mg by mouth daily. , Disp: , Rfl:    Melatonin 5 MG TBDP, Take 5 mg by mouth at bedtime. , Disp: , Rfl:    methotrexate (RHEUMATREX) 2.5 MG tablet, Take 12.5 mg by mouth every Sunday. , Disp: , Rfl: 2/  Exam: Current vital signs: There were no vitals taken for this visit. Vital signs in last 24 hours:    GENERAL: Awake, alert in NAD HEENT: -  Normocephalic and atraumatic, dry mm, no LN++, no Thyromegally LUNGS - Clear to auscultation bilaterally with no wheezes CV - S1S2 RRR, no m/r/g, equal pulses bilaterally. ABDOMEN - Soft, nontender, nondistended with normoactive BS Ext: warm, well perfused, intact peripheral pulses, no edema  NEURO:  Mental Status: AA&Ox2, disoriented to time Language: speech is clear.  Naming, repetition, fluency, and comprehension intact. Cranial Nerves: PERRL 21m/brisk. EOMI, visual fields full, left facial droop, facial sensation intact, hearing intact, tongue/uvula/soft palate midline, normal  sternocleidomastoid and trapezius muscle strength. No evidence of tongue atrophy or fibrillations Motor:  RUE 5/5   LUE 4/5 RLE 4/5- no drift  LLE 3/5 Tone: is normal and bulk is normal Sensation- Intact to light touch bilaterally Coordination: FTN intact bilaterally, no ataxia in BLE. Gait- deferred  NIHSS NIHSS components Score: Comment  1a Level of Conscious 0'[x]'$  1'[]'$  2'[]'$  3'[]'$         1b LOC Questions 0'[]'$  1'[x]'$  2'[]'$           1c LOC Commands 0'[x]'$  1'[]'$  2'[]'$           2 Best Gaze 0'[x]'$  1'[]'$  2'[]'$           3 Visual 0'[x]'$  1'[]'$  2'[]'$  3'[]'$         4 Facial Palsy 0'[]'$  1'[x]'$  2'[]'$  3'[]'$         5a Motor Arm - left 0'[]'$  1'[x]'$  2'[]'$  3'[]'$  4'[]'$  UN'[]'$     5b Motor Arm - Right 0'[x]'$  1'[]'$  2'[]'$  3'[]'$  4'[]'$  UN'[]'$     6a Motor Leg - Left 0'[]'$  1'[x]'$  2'[]'$  3'[]'$  4'[]'$  UN'[]'$     6b Motor Leg - Right 0'[x]'$  1'[]'$  2'[]'$  3'[]'$  4'[]'$  UN'[]'$     7 Limb Ataxia 0'[x]'$  1'[]'$  2'[]'$  3'[]'$  UN'[]'$       8 Sensory 0'[x]'$  1'[]'$  2'[]'$  UN'[]'$         9 Best Language 0'[x]'$  1'[]'$  2'[]'$  3'[]'$         10 Dysarthria 0'[x]'$  1'[]'$  2'[]'$  UN'[]'$         11 Extinct. and Inattention 0'[x]'$  1'[]'$  2'[]'$           TOTAL:          Labs I have reviewed labs in epic and the results pertinent to this consultation are: CBC    Component Value Date/Time   WBC 9.3 04/09/2019 0432   RBC 3.36 (L) 04/09/2019 0432   HGB 11.3 (L) 04/09/2019 0432   HGB 14.8 08/11/2011 2203   HCT 33.7 (L) 04/09/2019 0432   HCT 43.8 08/11/2011 2203   PLT 198 04/09/2019 0432   PLT 235 08/11/2011 2203   MCV 100.3 (H) 04/09/2019 0432   MCV 100 08/11/2011 2203   MCH 33.6 04/09/2019 0432   MCHC 33.5 04/09/2019 0432   RDW 14.2 04/09/2019 0432   RDW 13.0 08/11/2011 2203   LYMPHSABS 2.5 04/07/2019 0513   MONOABS 1.3 (H) 04/07/2019 0513   EOSABS 0.2 04/07/2019 0513   BASOSABS 0.1 04/07/2019 0513    CMP     Component Value Date/Time   NA 133 (L) 04/09/2019 0432   NA 142 08/11/2011 2203   K 3.5 04/09/2019 0432   K 3.7 08/11/2011 2203   CL 98 04/09/2019 0432   CL 104 08/11/2011 2203   CO2 28 04/09/2019 0432   CO2 30 08/11/2011 2203   GLUCOSE 136 (H)  04/09/2019 0432   GLUCOSE 83 08/11/2011 2203   BUN 16 04/09/2019 0432   BUN 26 (H) 08/11/2011 2203   CREATININE 1.21 (H) 04/09/2019 0432   CREATININE 1.04 08/11/2011 2203  CALCIUM 8.6 (L) 04/09/2019 0432   CALCIUM 9.0 08/11/2011 2203   PROT 5.4 (L) 01/20/2019 2015   PROT 7.1 08/11/2011 2203   ALBUMIN 3.4 (L) 01/20/2019 2015   ALBUMIN 4.0 08/11/2011 2203   AST 19 01/20/2019 2015   AST 17 08/11/2011 2203   ALT 17 01/20/2019 2015   ALT 21 08/11/2011 2203   ALKPHOS 74 01/20/2019 2015   ALKPHOS 59 08/11/2011 2203   BILITOT 0.6 01/20/2019 2015   BILITOT 0.6 08/11/2011 2203   GFRNONAA 44 (L) 04/09/2019 0432   GFRNONAA 57 (L) 08/11/2011 2203   GFRAA 51 (L) 04/09/2019 0432   GFRAA >60 08/11/2011 2203    Lipid Panel     Component Value Date/Time   CHOL 162 01/21/2019 0408   TRIG 115 01/21/2019 0408   HDL 34 (L) 01/21/2019 0408   CHOLHDL 4.8 01/21/2019 0408   VLDL 23 01/21/2019 0408   LDLCALC 105 (H) 01/21/2019 0408     Imaging I have reviewed the images obtained:  CT head- Acute/subacute infarct affecting portions of the right caudate and lentiform nuclei, as well as anterior limb of right internal capsule. ASPECTS is 7. Small infarcts within the right cerebellar hemisphere, new from the prior brain MRI of 01/20/2019 but otherwise age-indeterminate.  CTA Head and Neck  Assessment:  Subacute infarct affecting the right caudate head, lentiform nuclei and right internal capsule 76 y.o. female with a past medical history of arthritis, thyroid disease, hearing loss, previous strokes presenting with left facial droop, left side weakness. Given that her initial weakness started Saturday and that we are able to see ischemic changes on the CT head, it is likely that her initial infarct started Saturday and she may have had a secondary event that caused worsening of her symptoms today. Post CT and CTA her weakness and facial droop have improved. Recommend MRI brain and admission to  medical hospitalist for stroke work up.   Recommendations: - HgbA1c, fasting lipid panel - MRI of the brain without contrast - Frequent neuro checks - Echocardiogram - ASA '325mg'$  and plavix load '300mg'$   - Risk factor modification - Telemetry monitoring - PT consult, OT consult, Speech consult - Stroke team to follow    Patient seen and examined by NP/APP with MD. MD to update note as needed.   Janine Ores, DNP, FNP-BC [facility] Pager: [phone]  I have seen the patient and reviewed the above note.  She presents with left-sided weakness, though the patient denies it which makes me suspect neglect as well.  She states that she has felt generally weak since Saturday, and it is clear that something changed on Saturday.  Her CT shows subacute stroke and I suspect that she had an infarct on Saturday, with TIA today.  Her left-sided weakness is greatly improved even between the bridge and after CT.    She will need to be admitted for secondary risk factor modification, she is not a tPA candidate given some symptoms starting on Saturday and CT with infarct.  Roland Rack, MD [facility] [phone]  If 7pm- 7am, please page neurology on call as listed in [place].

## 2022-02-26 NOTE — Assessment & Plan Note (Addendum)
Has not been taking her statin. Start back lipitor  Check lipid panel, goal LDL <70

## 2022-02-26 NOTE — Code Documentation (Signed)
Stroke Response Nurse Documentation Code Documentation  Victoria Gutierrez is a 76 y.o. female arriving to [facility]  via [facility] on 02/26/2022 with past medical hx of stroke, HTN, arthritis. On aspirin 81 mg daily. Code stroke was activated by EMS.   Patient from home where she was [place] 02/20/2022, time unknown and now complaining of  left side weakness and left facial droop. Pt's daughter noticed pt to have new onset left facial droop and left sided weakness and called EMS.   Stroke team at the bedside on patient arrival. Labs drawn and patient cleared for CT by Dr. Laverta Baltimore. Patient to CT with team. NIHSS 4, see documentation for details and code stroke times. Patient with disoriented, left facial droop, left arm weakness, and left leg weakness on exam. The following imaging was completed:  CT Head and CTA. Patient is not a candidate for IV Thrombolytic due to unknown last known well and subacute infarct on imaging per provider. Patient is not a candidate for IR due to no LVO on CT per provider.   Care Plan: Q2H VS/modified NIH checks..   Bedside handoff with ED RN Jinny Blossom.    Charise Carwin  Stroke Response RN

## 2022-02-26 NOTE — Progress Notes (Signed)
PT Cancellation Note  Patient Details Name: VENESSA WICKHAM MRN: [id number] DOB: 09-Mar-1946   Cancelled Treatment:    Reason Eval/Treat Not Completed: Medical issues which prohibited therapy. Per flowsheet pt remains hypertensive, BP of 204/107 at 1549. PT will attempt to follow up for eval once pt's hypertension is more effectively managed and below goal of 180 per orders.   Zenaida Niece 02/26/2022, 3:54 PM

## 2022-02-27 ENCOUNTER — Inpatient Hospital Stay (HOSPITAL_COMMUNITY): Payer: Medicare HMO

## 2022-02-27 DIAGNOSIS — E7800 Pure hypercholesterolemia, unspecified: Secondary | ICD-10-CM

## 2022-02-27 DIAGNOSIS — Z8673 Personal history of transient ischemic attack (TIA), and cerebral infarction without residual deficits: Secondary | ICD-10-CM | POA: Diagnosis not present

## 2022-02-27 DIAGNOSIS — I639 Cerebral infarction, unspecified: Secondary | ICD-10-CM

## 2022-02-27 DIAGNOSIS — Z720 Tobacco use: Secondary | ICD-10-CM

## 2022-02-27 DIAGNOSIS — R52 Pain, unspecified: Secondary | ICD-10-CM | POA: Diagnosis not present

## 2022-02-27 DIAGNOSIS — I739 Peripheral vascular disease, unspecified: Secondary | ICD-10-CM

## 2022-02-27 DIAGNOSIS — F1210 Cannabis abuse, uncomplicated: Secondary | ICD-10-CM

## 2022-02-27 DIAGNOSIS — I10 Essential (primary) hypertension: Secondary | ICD-10-CM | POA: Diagnosis not present

## 2022-02-27 DIAGNOSIS — I6389 Other cerebral infarction: Secondary | ICD-10-CM

## 2022-02-27 LAB — ECHOCARDIOGRAM COMPLETE
AR max vel: 3.52 cm2
AV Area VTI: 3.95 cm2
AV Area mean vel: 3.41 cm2
AV Mean grad: 4 mmHg
AV Peak grad: 6.9 mmHg
Ao pk vel: 1.31 m/s
Height: 66 in
Weight: 2377.44 oz

## 2022-02-27 LAB — LIPID PANEL
Cholesterol: 176 mg/dL (ref 0–200)
HDL: 48 mg/dL (ref >40)
LDL Cholesterol: 116 mg/dL — ABNORMAL HIGH (ref 0–99)
Total CHOL/HDL Ratio: 3.7 RATIO
Triglycerides: 59 mg/dL (ref <150)
VLDL: 12 mg/dL (ref 0–40)

## 2022-02-27 MED ORDER — LISINOPRIL 20 MG PO TABS: 40.0000 mg | ORAL_TABLET | Freq: Every day | ORAL | Status: DC

## 2022-02-27 MED ORDER — AMLODIPINE BESYLATE 10 MG PO TABS
10.0000 mg | ORAL_TABLET | Freq: Every day | ORAL | Status: DC
  Administered 2022-02-27 – 2022-02-28 (×2): 10 mg via ORAL
  Filled 2022-02-27 (×2): qty 1

## 2022-02-27 MED ORDER — HYDRALAZINE HCL 20 MG/ML IJ SOLN
5.0000 mg | INTRAMUSCULAR | Status: DC | PRN
  Administered 2022-02-27: 5 mg via INTRAVENOUS
  Filled 2022-02-27: qty 1

## 2022-02-27 MED ORDER — LISINOPRIL 20 MG PO TABS
20.0000 mg | ORAL_TABLET | Freq: Every day | ORAL | Status: DC
  Administered 2022-02-27 – 2022-02-28 (×2): 20 mg via ORAL
  Filled 2022-02-27 (×2): qty 1

## 2022-02-27 MED ORDER — HYDROCHLOROTHIAZIDE 12.5 MG PO TABS
12.5000 mg | ORAL_TABLET | Freq: Every day | ORAL | Status: DC
  Administered 2022-02-27 – 2022-02-28 (×2): 12.5 mg via ORAL
  Filled 2022-02-27 (×2): qty 1

## 2022-02-27 MED ORDER — HYDROCHLOROTHIAZIDE 12.5 MG PO CAPS
12.5000 mg | ORAL_CAPSULE | Freq: Every day | ORAL | Status: DC
  Filled 2022-02-27 (×2): qty 1

## 2022-02-27 MED ORDER — HYDRALAZINE HCL 10 MG PO TABS: 10.0000 mg | ORAL_TABLET | Freq: Four times a day (QID) | ORAL | Status: DC | PRN

## 2022-02-27 MED ORDER — ATORVASTATIN CALCIUM 80 MG PO TABS
80.0000 mg | ORAL_TABLET | Freq: Every day | ORAL | Status: DC
  Administered 2022-02-27 – 2022-02-28 (×2): 80 mg via ORAL
  Filled 2022-02-27 (×2): qty 1

## 2022-02-27 NOTE — Progress Notes (Signed)
VASCULAR LAB    ABI has been performed.  See CV proc for preliminary results.   Minoru Chap, RVT 02/27/2022, 11:03 AM

## 2022-02-27 NOTE — Progress Notes (Signed)
  Echocardiogram 2D Echocardiogram has been performed.  Victoria Gutierrez 02/27/2022, 10:18 AM

## 2022-02-27 NOTE — Progress Notes (Signed)
VASCULAR LAB    Bilateral lower extremity venous duplex has been performed.  See CV proc for preliminary results.   Daeron Carreno, RVT 02/27/2022, 11:02 AM

## 2022-02-27 NOTE — Progress Notes (Signed)
Victoria Gutierrez  ZOX:[id number] DOB: [birth year] DOA: 02/26/2022 PCP: [place] Pink, MD    Brief Narrative:  76 year old with a history of HTN, tobacco abuse, HLD, prior CVA, and psoriasis who presented to the ER with generalized weakness and body aches preceding the acute onset of a left-sided facial droop and slurring of speech.  In the ER CT head revealed a subacute infarct of the right caudate and lentiform nuclei as well as right internal capsule with other small infarcts within the right cerebellar hemisphere new since MRI in 2020.  Consultants:  None  Goals of Care:  Code Status: Full Code   DVT prophylaxis: Lovenox  Interim Hx: Blood pressure elevated above goal even with permissive hypertension in mind.  Afebrile. Resting comfortably in bed. Somewhat slow to respond to questions, but alert and oriented. Denies new complaints.   Assessment & Plan:  Acute/subacute CVA acute/subacute CVA of right caudate and lentiform nuclei as well as right internal capsule - +smoker, HTN, HLD MRI brain notes acute infarcts in the right pons and right basal ganglia with severe chronic small vessel disease TTE pending A1c 5.4 LDL 116 Continue daily aspirin 81 mg and start Plavix  PT/OT evals pending  Neurology directing evaluation   Hypertension Allow for permissive HTN in setting of acute CVA   Hyperlipidemia Has not been taking her statin -resume lipitor   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  ABI pending   Tobacco abuse Counseled on absolute need to discontinue smoking entirely  Family Communication: spoke w/ daughter at bedside  Disposition:  awaiting PT/OT evals    Objective: Blood pressure (!) 228/127, pulse 98, temperature 98.2 F (36.8 C), temperature source Oral, resp. rate 18, height '5\' 6"'$  (1.676 m), weight 67.4 kg, SpO2 97 %.  Intake/Output Summary (Last 24 hours) at 02/27/2022 0943 Last data filed at 02/27/2022 0455 Gross per 24 hour  Intake 899.2  ml  Output --  Net 899.2 ml   Filed Weights   02/26/22 1300 02/26/22 1355 02/26/22 1404  Weight: 67.4 kg 67.4 kg 67.4 kg    Examination: General: No acute respiratory distress Lungs: Clear to auscultation bilaterally without wheezes or crackles Cardiovascular: Regular rate and rhythm without murmur gallop or rub normal S1 and S2 Abdomen: Nontender, nondistended, soft, bowel sounds positive, no rebound, no ascites, no appreciable mass Extremities: No significant cyanosis, clubbing, or edema bilateral lower extremities  CBC: Recent Labs  Lab 02/26/22 1349 02/26/22 1355  WBC 8.9  --   NEUTROABS 5.5  --   HGB 16.6* 16.7*  HCT 49.2* 49.0*  MCV 98.0  --   PLT 232  --    Basic Metabolic Panel: Recent Labs  Lab 02/26/22 1349 02/26/22 1355  NA 137 140  K 4.2 4.2  CL 105 102  CO2 25  --   GLUCOSE 108* 108*  BUN 16 18  CREATININE 0.93 0.90  CALCIUM 9.2  --    GFR: Estimated Creatinine Clearance: 49.8 mL/min (by C-G formula based on SCr of 0.9 mg/dL).  HbA1C: Hgb A1c MFr Bld  Date/Time Value Ref Range Status  02/26/2022 03:30 PM 5.4 4.8 - 5.6 % Final    Comment:    (NOTE) Pre diabetes:          5.7%-6.4%  Diabetes:              >6.4%  Glycemic control for   <7.0% adults with diabetes   01/21/2019 04:08 AM 5.7 (H) 4.8 - 5.6 %  Final    Comment:    (NOTE) Pre diabetes:          5.7%-6.4% Diabetes:              >6.4% Glycemic control for   <7.0% adults with diabetes     Scheduled Meds:  aspirin EC  325 mg Oral Daily   atorvastatin  40 mg Oral q1800   citalopram  20 mg Oral Daily   clopidogrel  75 mg Oral Daily   lisinopril  20 mg Oral Daily   nicotine  21 mg Transdermal Daily     LOS: 1 day   Cherene Altes, MD [facility] Office  [phone] Pager - Text Page per Shea Evans  If 7PM-7AM, please contact night-coverage per Amion 02/27/2022, 9:43 AM

## 2022-02-27 NOTE — Progress Notes (Addendum)
STROKE TEAM PROGRESS NOTE   SUBJECTIVE (INTERVAL HISTORY) Her daughter is at the bedside.  Overall her condition is gradually improving.  She is sitting in chair, awake alert, flat affect.  Daughter stated that patient not taking good care of herself at home, eating salty food, takes sugar drink, continue smoking and use marijuana, BP was high at home and lack of activity.  Educated on aggressive risk factor modification.   OBJECTIVE Temp:  [97.7 F (36.5 C)-99 F (37.2 C)] 99 F (37.2 C) (09/02 1139) Pulse Rate:  [71-98] 97 (09/02 1139) Cardiac Rhythm: Normal sinus rhythm (09/02 0800) Resp:  [18-24] 18 (09/02 1139) BP: (168-234)/(107-135) 197/118 (09/02 1139) SpO2:  [95 %-100 %] 99 % (09/02 1139) Weight:  [67.4 kg] 67.4 kg (09/01 1404)  Recent Labs  Lab 02/26/22 1349  GLUCAP 124*   Recent Labs  Lab 02/26/22 1349 02/26/22 1355  NA 137 140  K 4.2 4.2  CL 105 102  CO2 25  --   GLUCOSE 108* 108*  BUN 16 18  CREATININE 0.93 0.90  CALCIUM 9.2  --    Recent Labs  Lab 02/26/22 1349  AST 17  ALT 15  ALKPHOS 56  BILITOT 0.8  PROT 6.4*  ALBUMIN 3.8   Recent Labs  Lab 02/26/22 1349 02/26/22 1355  WBC 8.9  --   NEUTROABS 5.5  --   HGB 16.6* 16.7*  HCT 49.2* 49.0*  MCV 98.0  --   PLT 232  --    Recent Labs  Lab 02/26/22 1530  CKTOTAL 86   Recent Labs    02/26/22 1530  LABPROT 13.5  INR 1.0   Recent Labs    02/26/22 1353  COLORURINE YELLOW  LABSPEC 1.043*  PHURINE 8.0  GLUCOSEU NEGATIVE  HGBUR NEGATIVE  BILIRUBINUR NEGATIVE  KETONESUR NEGATIVE  PROTEINUR NEGATIVE  NITRITE NEGATIVE  LEUKOCYTESUR NEGATIVE       Component Value Date/Time   CHOL 176 02/27/2022 0438   TRIG 59 02/27/2022 0438   HDL 48 02/27/2022 0438   CHOLHDL 3.7 02/27/2022 0438   VLDL 12 02/27/2022 0438   LDLCALC 116 (H) 02/27/2022 0438   Lab Results  Component Value Date   HGBA1C 5.4 02/26/2022      Component Value Date/Time   LABOPIA NONE DETECTED 02/26/2022 1353    COCAINSCRNUR NONE DETECTED 02/26/2022 1353   LABBENZ NONE DETECTED 02/26/2022 1353   AMPHETMU NONE DETECTED 02/26/2022 1353   THCU POSITIVE (A) 02/26/2022 1353   LABBARB NONE DETECTED 02/26/2022 1353    Recent Labs  Lab 02/26/22 1349  ETH <10    I have personally reviewed the radiological images below and agree with the radiology interpretations.  VAS [place] LOWER EXTREMITY VENOUS (DVT)  Result Date: 02/27/2022  Lower Venous DVT Study Patient Name:  SHAUN ZUCCARO  Date of Exam:   02/27/2022 Medical Rec #: [id number]       Accession #:    [id number] Date of Birth: 1945/07/10       Patient Gender: F Patient Age:   85 years Exam Location:  [facility] Procedure:      VAS [place] LOWER EXTREMITY VENOUS (DVT) Referring Phys: Cornelius Moras Lauran Romanski --------------------------------------------------------------------------------  Indications: Stroke, and Pain.  Comparison Study: No prior study on file Performing Technologist: Sharion Dove RVS  Examination Guidelines: A complete evaluation includes B-mode imaging, spectral Doppler, color Doppler, and power Doppler as needed of all accessible portions of each vessel. Bilateral testing is considered an integral part of a complete  examination. Limited examinations for reoccurring indications may be performed as noted. The reflux portion of the exam is performed with the patient in reverse Trendelenburg.  +---------+---------------+---------+-----------+----------+--------------+ RIGHT    CompressibilityPhasicitySpontaneityPropertiesThrombus Aging +---------+---------------+---------+-----------+----------+--------------+ CFV      Full           Yes      Yes                                 +---------+---------------+---------+-----------+----------+--------------+ SFJ      Full                                                        +---------+---------------+---------+-----------+----------+--------------+ FV Prox  Full                                                         +---------+---------------+---------+-----------+----------+--------------+ FV Mid   Full                                                        +---------+---------------+---------+-----------+----------+--------------+ FV DistalFull                                                        +---------+---------------+---------+-----------+----------+--------------+ PFV      Full                                                        +---------+---------------+---------+-----------+----------+--------------+ POP      Full           Yes      Yes                                 +---------+---------------+---------+-----------+----------+--------------+ PTV      Full                                                        +---------+---------------+---------+-----------+----------+--------------+ PERO     Full                                                        +---------+---------------+---------+-----------+----------+--------------+   +---------+---------------+---------+-----------+----------+--------------+ LEFT     CompressibilityPhasicitySpontaneityPropertiesThrombus Aging +---------+---------------+---------+-----------+----------+--------------+ CFV      Full  Yes      Yes                                 +---------+---------------+---------+-----------+----------+--------------+ SFJ      Full                                                        +---------+---------------+---------+-----------+----------+--------------+ FV Prox  Full                                                        +---------+---------------+---------+-----------+----------+--------------+ FV Mid   Full                                                        +---------+---------------+---------+-----------+----------+--------------+ FV DistalFull                                                         +---------+---------------+---------+-----------+----------+--------------+ PFV      Full                                                        +---------+---------------+---------+-----------+----------+--------------+ POP      Full           Yes      Yes                                 +---------+---------------+---------+-----------+----------+--------------+ PTV      Full                                                        +---------+---------------+---------+-----------+----------+--------------+ PERO     Full                                                        +---------+---------------+---------+-----------+----------+--------------+     Summary: BILATERAL: - No evidence of deep vein thrombosis seen in the lower extremities, bilaterally. -No evidence of popliteal cyst, bilaterally.   *See table(s) above for measurements and observations. Electronically signed by Monica Martinez MD on 02/27/2022 at 11:56:47 AM.    Final    VAS [place] ABI WITH/WO TBI  Result Date: 02/27/2022  LOWER EXTREMITY DOPPLER STUDY Patient Name:  NIKKOLE PLACZEK  Date of Exam:   02/27/2022 Medical Rec #: [id number]       Accession #:    [id number] Date of Birth: 1945-10-16       Patient Gender: F Patient Age:   37 years Exam Location:  [facility] Procedure:      VAS [place] ABI WITH/WO TBI Referring Phys: Orma Flaming --------------------------------------------------------------------------------  Indications: Claudication. New CVA High Risk Factors: Hypertension, hyperlipidemia, prior CVA.  Comparison Study: No prior study Performing Technologist: Sharion Dove RVS  Examination Guidelines: A complete evaluation includes at minimum, Doppler waveform signals and systolic blood pressure reading at the level of bilateral brachial, anterior tibial, and posterior tibial arteries, when vessel segments are accessible. Bilateral testing is considered an integral part of a complete examination. Photoelectric  Plethysmograph (PPG) waveforms and toe systolic pressure readings are included as required and additional duplex testing as needed. Limited examinations for reoccurring indications may be performed as noted.  ABI Findings: +---------+------------------+-----+-----------+--------+ Right    Rt Pressure (mmHg)IndexWaveform   Comment  +---------+------------------+-----+-----------+--------+ Brachial 222                    triphasic           +---------+------------------+-----+-----------+--------+ PTA      148               0.67 multiphasic         +---------+------------------+-----+-----------+--------+ DP       135               0.61 multiphasic         +---------+------------------+-----+-----------+--------+ Great Toe115               0.52                     +---------+------------------+-----+-----------+--------+ +---------+------------------+-----+-----------+-------+ Left     Lt Pressure (mmHg)IndexWaveform   Comment +---------+------------------+-----+-----------+-------+ Brachial 210                    triphasic          +---------+------------------+-----+-----------+-------+ PTA      125               0.56 multiphasic        +---------+------------------+-----+-----------+-------+ DP       113               0.51 multiphasic        +---------+------------------+-----+-----------+-------+ Great Toe93                0.42                    +---------+------------------+-----+-----------+-------+ +-------+-----------+-----------+------------+------------+ ABI/TBIToday's ABIToday's TBIPrevious ABIPrevious TBI +-------+-----------+-----------+------------+------------+ Right  0.67       0.52                                +-------+-----------+-----------+------------+------------+ Left   0.56       0.42                                +-------+-----------+-----------+------------+------------+  Summary: Right: Resting right  ankle-brachial index indicates moderate right lower extremity arterial disease. The right toe-brachial index is abnormal. Left: Resting left ankle-brachial index indicates moderate left lower extremity arterial disease. The left toe-brachial index is abnormal. *See table(s) above for measurements and observations.  Vascular consult  recommended. Electronically signed by Monica Martinez MD on 02/27/2022 at 11:52:22 AM.    Final    ECHOCARDIOGRAM COMPLETE  Result Date: 02/27/2022    ECHOCARDIOGRAM REPORT   Patient Name:   [name] Date of Exam: 02/27/2022 Medical Rec #:  [id number]      Height:       66.0 in Accession #:    [id number]     Weight:       148.6 lb Date of Birth:  [birth year]      BSA:          1.763 m Patient Age:    17 years       BP:           228/127 mmHg Patient Gender: F              HR:           105 bpm. Exam Location:  Inpatient Procedure: 2D Echo, Cardiac Doppler and Color Doppler Indications:    Stroke  History:        Patient has prior history of Echocardiogram examinations, most                 recent 01/21/2019. Risk Factors:Current Smoker, Hypertension and                 Dyslipidemia.; Medications:8.  Sonographer:    Clayton Lefort RDCS (AE) Referring Phys: [id number] [facility]  Sonographer Comments: Technically difficult study due to poor echo windows. IMPRESSIONS  1. Left ventricular ejection fraction, by estimation, is 65 to 70%. The left ventricle has normal function. The left ventricle has no regional wall motion abnormalities. Left ventricular diastolic parameters are consistent with Grade I diastolic dysfunction (impaired relaxation).  2. Right ventricular systolic function is normal. The right ventricular size is normal.  3. The mitral valve is abnormal. No evidence of mitral valve regurgitation. No evidence of mitral stenosis.  4. The aortic valve was not well visualized. There is mild calcification of the aortic valve. There is mild thickening of the aortic valve. Aortic valve  regurgitation is not visualized. Aortic valve sclerosis is present, with no evidence of aortic valve  stenosis.  5. The inferior vena cava is normal in size with greater than 50% respiratory variability, suggesting right atrial pressure of 3 mmHg. FINDINGS  Left Ventricle: Left ventricular ejection fraction, by estimation, is 65 to 70%. The left ventricle has normal function. The left ventricle has no regional wall motion abnormalities. The left ventricular internal cavity size was small. There is no left ventricular hypertrophy. Left ventricular diastolic parameters are consistent with Grade I diastolic dysfunction (impaired relaxation). Right Ventricle: The right ventricular size is normal. No increase in right ventricular wall thickness. Right ventricular systolic function is normal. Left Atrium: Left atrial size was normal in size. Right Atrium: Right atrial size was normal in size. Pericardium: There is no evidence of pericardial effusion. Mitral Valve: The mitral valve is abnormal. There is mild thickening of the mitral valve leaflet(s). There is mild calcification of the mitral valve leaflet(s). Mild mitral annular calcification. No evidence of mitral valve regurgitation. No evidence of mitral valve stenosis. Tricuspid Valve: The tricuspid valve is normal in structure. Tricuspid valve regurgitation is not demonstrated. No evidence of tricuspid stenosis. Aortic Valve: The aortic valve was not well visualized. There is mild calcification of the aortic valve. There is mild thickening of the aortic valve. Aortic valve regurgitation is not visualized. Aortic valve sclerosis is present, with  no evidence of aortic valve stenosis. Aortic valve mean gradient measures 4.0 mmHg. Aortic valve peak gradient measures 6.9 mmHg. Aortic valve area, by VTI measures 3.95 cm. Pulmonic Valve: The pulmonic valve was normal in structure. Pulmonic valve regurgitation is not visualized. No evidence of pulmonic stenosis. Aorta: The  aortic root is normal in size and structure. Venous: The inferior vena cava is normal in size with greater than 50% respiratory variability, suggesting right atrial pressure of 3 mmHg. IAS/Shunts: No atrial level shunt detected by color flow Doppler.  LEFT VENTRICLE PLAX 2D LVOT diam:     2.10 cm   Diastology LV SV:         77        LV e' lateral: 6.42 cm/s LV SV Index:   43 LVOT Area:     3.46 cm  RIGHT VENTRICLE RV S prime:     14.80 cm/s TAPSE (M-mode): 1.7 cm LEFT ATRIUM           Index        RIGHT ATRIUM           Index LA Vol (A4C): 44.2 ml 25.08 ml/m  RA Area:     13.30 cm                                    RA Volume:   33.50 ml  19.01 ml/m  AORTIC VALVE AV Area (Vmax):    3.52 cm AV Area (Vmean):   3.41 cm AV Area (VTI):     3.95 cm AV Vmax:           131.00 cm/s AV Vmean:          85.300 cm/s AV VTI:            0.194 m AV Peak Grad:      6.9 mmHg AV Mean Grad:      4.0 mmHg LVOT Vmax:         133.00 cm/s LVOT Vmean:        83.900 cm/s LVOT VTI:          0.221 m LVOT/AV VTI ratio: 1.14  AORTA Ao Root diam: 3.30 cm  SHUNTS Systemic VTI:  0.22 m Systemic Diam: 2.10 cm Jenkins Rouge MD Electronically signed by Jenkins Rouge MD Signature Date/Time: 02/27/2022/10:31:56 AM    Final    MR BRAIN WO CONTRAST  Result Date: 02/26/2022 CLINICAL DATA:  Transient ischemic attack (TIA). Left-sided weakness and facial droop. EXAM: MRI HEAD WITHOUT CONTRAST TECHNIQUE: Multiplanar, multiecho pulse sequences of the brain and surrounding structures were obtained without intravenous contrast. COMPARISON:  Head CT and CTA 02/26/2022.  Head MRI 01/20/2019. FINDINGS: The study is intermittently up to moderately motion degraded. Brain: There is a 1.2 cm acute infarct in the right ventral pons, and there is a 2.2 cm acute infarct anteriorly in the right basal ganglia involving the caudate and lentiform nuclei and intervening white matter. A chronic infarct is noted in the left basal ganglia with associated chronic blood  products and ex vacuo dilatation of the left lateral ventricle. Patchy and confluent T2 hyperintensities elsewhere in the cerebral white matter bilaterally and in the pons are similar to the prior MRI and are nonspecific but compatible with severe chronic small vessel ischemic disease. There are chronic lacunar infarcts in the cerebral white matter bilaterally and in the right cerebellar hemisphere with the latter being new  from 2020. Wallerian degeneration involves the anterior body of the corpus callosum. There is mild cerebral atrophy. No mass, midline shift, or extra-axial fluid collection is identified. Vascular: Major intracranial vascular flow voids are preserved. Skull and upper cervical spine: No suspicious marrow lesion. Sinuses/Orbits: Bilateral cataract extraction. Large mucous retention cyst in the left maxillary sinus. Mild mucosal thickening in the right maxillary sinus. Clear mastoid air cells. Other: None. IMPRESSION: 1. 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 2. Severe chronic small vessel ischemic disease. Electronically Signed   By: Logan Bores M.D.   On: 02/26/2022 17:33   CT ANGIO HEAD NECK W WO CM (CODE STROKE)  Result Date: 02/26/2022 CLINICAL DATA:  Code stroke EXAM: CT ANGIOGRAPHY HEAD AND NECK TECHNIQUE: Multidetector CT imaging of the head and neck was performed using the standard protocol during bolus administration of intravenous contrast. Multiplanar CT image reconstructions and MIPs were obtained to evaluate the vascular anatomy. Carotid stenosis measurements (when applicable) are obtained utilizing NASCET criteria, using the distal internal carotid diameter as the denominator. RADIATION DOSE REDUCTION: This exam was performed according to the departmental dose-optimization program which includes automated exposure control, adjustment of the mA and/or kV according to patient size and/or use of iterative reconstruction technique. CONTRAST:  36m OMNIPAQUE IOHEXOL  350 MG/ML SOLN COMPARISON:  Same-day noncontrast CT head, CTA head and neck 01/20/2019 FINDINGS: CTA NECK FINDINGS Aortic arch: There is mild calcified plaque in the imaged aortic arch. The origins of the major branch vessels are patent with mild stenosis at the origin of the subclavian artery. The subclavian arteries are otherwise patent to the level imaged. Right carotid system: The right common carotid artery is patent. There is mixed plaque in the proximal right internal carotid artery resulting in up to approximately 50% stenosis. The distal right internal carotid artery is patent. The right external carotid artery is patent. There is no dissection or aneurysm. Left carotid system: The left common carotid artery is patent. There is mixed plaque in the proximal internal carotid artery resulting in up to approximately 40-50% stenosis. The distal left internal carotid artery is patent. The external carotid artery is patent. There is no dissection or aneurysm. Vertebral arteries: There is moderate stenosis of the origin of the right vertebral artery. The right vertebral artery is otherwise patent with mild plaque at the V3 segment. The non dominant left vertebral artery is patent without hemodynamically significant stenosis or occlusion. There is no dissection or aneurysm. Skeleton: There is no acute osseous abnormality or suspicious osseous lesion. Other neck: A right thyroid nodule is seen as seen on prior ultrasound. The soft tissues of the neck are otherwise unremarkable. Upper chest: The imaged lung apices are clear. Review of the MIP images confirms the above findings CTA HEAD FINDINGS Anterior circulation: There is calcified plaque in the intracranial ICAs resulting in mild stenosis of the right paraclinoid segment and no significant stenosis on the left. The right M1 and proximal M2 segments are patent. There is severe stenosis a distal right M2/proximal M3 branch in the distal sylvian fissure (8-101,  12-11) and additional severe stenosis of the same branch distally as it courses laterally over the cortex (8-108, 9-45), new since 2020. The left M1 segment is occluded proximally. There is reconstitution of flow in the M2 branches which are overall decreased in caliber compared to the right. 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 The ACAs are otherwise patent proximally. There  is multifocal severe stenosis/occlusion of the distal right ACA (for example 7-72), worsened since 2020 There is no aneurysm or AVM. Posterior circulation: There is a PICA termination of the left vertebral artery. There is dense calcification of the proximal right V4 segment resulting in moderate stenosis. The basilar artery is diminutive distally and terminates at the superior cerebellar arteries, unchanged. The bilateral PCAs are patent with fetal origins bilaterally. There is no proximal stenosis or occlusion. There is no aneurysm or AVM. Venous sinuses: Patent. Anatomic variants: As above. Review of the MIP images confirms the above findings IMPRESSION: 1. No emergent large vessel occlusion. 2. 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 3. 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 4. Unchanged proximal left M1 occlusion with reconstitution but overall diminished caliber of the distal left MCA branches compared to the right. 5. Unchanged moderate stenosis of the right V4 segment and multifocal irregularity of the basilar artery which terminates at the superior cerebellar arteries. Fetal PCAs bilaterally are patent. 6. Unchanged moderate stenosis of the dominant right vertebral artery origin. 7. Calcified plaque at the carotid bifurcations resulting in approximately 50% stenosis on the right and 40-50% stenosis on the left. Electronically Signed   By: Valetta Mole M.D.   On: 02/26/2022 15:02   CT  HEAD CODE STROKE WO CONTRAST  Result Date: 02/26/2022 CLINICAL DATA:  Provided history: Neuro deficit, acute, stroke suspected. Code stroke. EXAM: CT HEAD WITHOUT CONTRAST TECHNIQUE: Contiguous axial images were obtained from the base of the skull through the vertex without intravenous contrast. RADIATION DOSE REDUCTION: This exam was performed according to the departmental dose-optimization program which includes automated exposure control, adjustment of the mA and/or kV according to patient size and/or use of iterative reconstruction technique. COMPARISON:  Brain MRI 01/20/2019. FINDINGS: Brain: Generalized cerebral atrophy. Abnormal hypodensity within portions of the right caudate and lentiform nuclei, as well as anterior limb of right internal capsule. Findings are compatible with an acute/early subacute infarction. Redemonstrated chronic lacunar infarct within the left caudate nucleus. Advanced patchy and ill-defined hypoattenuation within the cerebral white matter, nonspecific but compatible with chronic small vessel ischemic disease. Small infarcts within the right cerebellar hemisphere, new from the prior brain MRI of 01/20/2019 but otherwise age-indeterminate. There is no acute intracranial hemorrhage. No demarcated cortical infarct is identified. No extra-axial fluid collection. No evidence of an intracranial mass. No midline shift. Vascular: No hyperdense vessel. Atherosclerotic calcifications. Skull: No fracture or aggressive osseous lesion. Sinuses/Orbits: No mass or acute finding within the imaged orbits. Mild mucosal thickening within the right maxillary sinus. 3.3 cm mucous retention cyst within the left maxillary sinus. Small mucous retention cyst, and background trace mucosal thickening, within the right sphenoid sinus. ASPECTS ([place] Stroke Program Early CT Score) - Ganglionic level infarction (caudate, lentiform nuclei, internal capsule, insula, M1-M3 cortex): 4 - Supraganglionic infarction  (M4-M6 cortex): 3 Total score (0-10 with 10 being normal): 7 Pertinent results were called by telephone at the time of interpretation on 02/26/2022 at 2:10 pm to provider Dr. Leonel Ramsay, who verbally acknowledged these results. IMPRESSION: 1. Acute/subacute infarct affecting portions of the right caudate and lentiform nuclei, as well as anterior limb of right internal capsule. ASPECTS is 7. 2. Small infarcts within the right cerebellar hemisphere, new from the prior brain MRI of 01/20/2019 but otherwise age-indeterminate. 3. Advanced chronic small vessel ischemic changes within the cerebral white matter. 4. Cerebral atrophy. 5. Paranasal sinus disease, as described. Electronically  Signed   By: Kellie Simmering D.O.   On: 02/26/2022 14:30     PHYSICAL EXAM  Temp:  [97.7 F (36.5 C)-99 F (37.2 C)] 99 F (37.2 C) (09/02 1139) Pulse Rate:  [71-98] 97 (09/02 1139) Resp:  [18-24] 18 (09/02 1139) BP: (168-234)/(107-135) 197/118 (09/02 1139) SpO2:  [95 %-100 %] 99 % (09/02 1139) Weight:  [67.4 kg] 67.4 kg (09/01 1404)  General - Well nourished, well developed, in no apparent distress.  Ophthalmologic - fundi not visualized due to noncooperation.  Cardiovascular - Regular rhythm and rate.  Neuro - awake, alert, flat affect, eyes open, orientated to age, place, time and people. No aphasia, paucity of speech, following all simple commands. Able to name and repeat. No gaze palsy, tracking bilaterally, visual field full. Mild left facial droop. Tongue midline. Bilateral UEs 4/5, no drift but left hand grip 4/5. Bilaterally LEs 3/5, no drift. Sensation symmetrical bilaterally, b/l FTN intact, gait not tested.     ASSESSMENT/PLAN Ms. SHEKIA KUPER is a 76 y.o. female with history of hypertension, smoker, THC user, strokes admitted for left-sided weakness and left facial droop. No tPA given due to outside window.    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 Large/small vessel disease CT head acute right caudate head infarct, old right cerebellum infarct but new since 12/2018 CTA head and neck right M2/M3 and right ACA severe stenosis, left M1 chronic occlusion, bilateral ICA siphon 50% stenosis.  Right VA stenosis, basilar artery irregular, bilateral fetal PCAs. MRI right pontine and right caudate head infarcts 2D Echo EF 65 to 70% LE venous Doppler no DVT Recommend loop recorder to rule out afib. Can be done inpt or outpt LDL 116 HgbA1c 5.4 UDS positive for THC SCDs for VTE prophylaxis aspirin 81 mg daily prior to admission, now on aspirin 325 mg daily and clopidogrel 75 mg daily DAPT for 3 months and then Plavix alone given intracranial large vessel stenosis. Patient counseled to be compliant with her antithrombotic medications Ongoing aggressive stroke risk factor management Therapy recommendations: Pending Disposition: Pending  History of stroke Intra- and extracranial stenosis 2012 MRI showed left MCA scattered infarcts, Left M1 still patent on MRI. 2018 MRI showed left M1 occlusion 12/2018 admitted for right sided weakness, MRI showed left MCA small infarcts, old left caudate and CR infarcts. CTA head and neck showed chronic proximal left M1 occlusion with distal collaterals. 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 Tandem stenosis of the right vertebral artery at the C6 level. B/l dense calcification at ICA bulbs and hypoplastic BA.  EF 55%.  LDL 105, A1c 5.7, UDS positive for THC.  Discharged on DAPT and Lipitor 40.  Outpatient 30-day CardioNet monitor negative for A-fib.  Hypertensive urgency Unstable BP persistently up to 200s Now resume Lipitor 40 and HCTZ 12.5 Gradually down to BP target in 3-5 days Long term BP goal 130-150 given left M1 occlusion, severe intracranial stenosis  Hyperlipidemia Home meds: Lipitor 40 LDL 116, goal < 70 Now on Neupro 80 Continue statin at  discharge  Tobacco abuse Current smoker Smoking cessation counseling provided Nicotine patch provided Pt is willing to quit  Other Stroke Risk Factors Advanced age Chronic THC abuse, cessation education provided  Other [facility] day # 1  I discussed with Dr. Thereasa Solo. I spent extensive face-to-face time with the patient and her daughter, more than 50% of which was spent  in counseling and coordination of care, reviewing test results, images and medication, and discussing the diagnosis, treatment plan and potential prognosis. This patient's care requiresreview of multiple databases, neurological assessment, discussion with family, other specialists and medical decision making of high complexity.    Rosalin Hawking, MD PhD Stroke Neurology 02/27/2022 12:45 PM    To contact Stroke Continuity provider, please refer to [email or web address] After hours, contact General Neurology

## 2022-02-27 NOTE — Progress Notes (Signed)
PT Cancellation Note  Patient Details Name: CANDANCE BOHLMAN MRN: [id number] DOB: 1946-05-02   Cancelled Treatment:    Reason Eval/Treat Not Completed: Medical issues which prohibited therapy.  BP currently running 228/127, well over parameters set by MD.  Will continue once BP within acceptable range.     Shanna Cisco 02/27/2022, 10:47 AM

## 2022-02-27 NOTE — Evaluation (Signed)
Physical Therapy Evaluation Patient Details Name: Victoria Gutierrez MRN: [id number] DOB: April 01, 1946 Today's Date: 02/27/2022  History of Present Illness  Pt is a 76 y.o. female admitted with acute/subacute CVA of right caudate and lentiform nuclei as well as right internal capsule. Small infarcts w/in the right cerebellar hemisphere are also new since MRI in 12/2018. PMH significant for HTN, HLD, hx of CVA, psoriasis, THR.  Clinical Impression  Patient presents with dependencies in gait and mobility due to generalized weakness and decreased balance due to CVA.  PTA, patient was independent with mobility and self-care.  Feel patient will make steady progress and can return home within 1-2 days with family at supervision level and use of cane.  PT will follow while in hospital to progress mobility and independence.       Recommendations for follow up therapy are one component of a multi-disciplinary discharge planning process, led by the attending physician.  Recommendations may be updated based on patient status, additional functional criteria and insurance authorization.  Follow Up Recommendations Home health PT      Assistance Recommended at Discharge Intermittent Supervision/Assistance  Patient can return home with the following  A little help with walking and/or transfers;A little help with bathing/dressing/bathroom;Help with stairs or ramp for entrance;Assistance with cooking/housework    Equipment Recommendations None recommended by PT (has cane already)  Recommendations for Other Services       Functional Status Assessment Patient has had a recent decline in their functional status and demonstrates the ability to make significant improvements in function in a reasonable and predictable amount of time.     Precautions / Restrictions Precautions Precautions: Fall Restrictions Weight Bearing Restrictions: No      Mobility  Bed Mobility Overal bed mobility: Needs Assistance Bed  Mobility: Sidelying to Sit   Sidelying to sit: Min assist       General bed mobility comments: slight assistance needed to raise shoulders off bed and scoot to edge of bed    Transfers Overall transfer level: Needs assistance Equipment used: Rolling walker (2 wheels) Transfers: Sit to/from Stand Sit to Stand: Min assist                Ambulation/Gait Ambulation/Gait assistance: Min assist Gait Distance (Feet): 100 Feet Assistive device: Rolling walker (2 wheels), 1 person hand held assist, None (ambulated about 30' each with RW, 1 person hand held and no device) Gait Pattern/deviations: Decreased stride length, Step-through pattern Gait velocity: decreased        Stairs            Wheelchair Mobility    Modified Rankin (Stroke Patients Only) Modified Rankin (Stroke Patients Only) Pre-Morbid Rankin Score: No symptoms Modified Rankin: Moderately severe disability     Balance Overall balance assessment: Needs assistance Sitting-balance support: No upper extremity supported, Feet supported Sitting balance-Leahy Scale: Fair Sitting balance - Comments: patient leaning posteriorly when performing LE muscle testing   Standing balance support: No upper extremity supported Standing balance-Leahy Scale: Poor Standing balance comment: able to static stand with min guarding x 30 sec                             Pertinent Vitals/Pain Pain Assessment Pain Assessment: No/denies pain    Home Living Family/patient expects to be discharged to:: Private residence Living Arrangements: Children Available Help at Discharge: Family;Available 24 hours/day Type of Home: House Home Access: Stairs to enter Entrance Stairs-Rails: None Entrance  Stairs-Number of Steps: 2 Alternate Level Stairs-Number of Steps: 2 steps to go from living area to bedroom area Home Layout: Multi-level Home Equipment: Kasandra Knudsen - single point      Prior Function Prior Level of Function :  Independent/Modified Independent                     Hand Dominance        Extremity/Trunk Assessment   Upper Extremity Assessment Upper Extremity Assessment: LUE deficits/detail LUE Deficits / Details: some weakness noted compared to right side.  Unable to fully elevate shoulder to raise hand, grip weaker compared to right.    Lower Extremity Assessment Lower Extremity Assessment: LLE deficits/detail LLE Deficits / Details: some weakness noted compared to right side.  Knee extension 4/5, ankle dorsiflex 4/5       Communication   Communication: No difficulties  Cognition Arousal/Alertness: Awake/alert Behavior During Therapy: Flat affect Overall Cognitive Status: Within Functional Limits for tasks assessed                                          General Comments      Exercises     Assessment/Plan    PT Assessment Patient needs continued PT services  PT Problem List Decreased strength;Decreased range of motion;Decreased activity tolerance;Decreased balance;Decreased mobility;Decreased knowledge of use of DME       PT Treatment Interventions DME instruction;Gait training;Stair training;Functional mobility training;Balance training;Therapeutic exercise;Therapeutic activities;Patient/family education    PT Goals (Current goals can be found in the Care Plan section)  Acute Rehab PT Goals Patient Stated Goal: go home PT Goal Formulation: With patient/family Time For Goal Achievement: 03/06/22 Potential to Achieve Goals: Good    Frequency Min 4X/week     Co-evaluation               AM-PAC PT "6 Clicks" Mobility  Outcome Measure Help needed turning from your back to your side while in a flat bed without using bedrails?: None Help needed moving from lying on your back to sitting on the side of a flat bed without using bedrails?: A Little Help needed moving to and from a bed to a chair (including a wheelchair)?: A Little Help needed  standing up from a chair using your arms (e.g., wheelchair or bedside chair)?: A Little Help needed to walk in hospital room?: A Little Help needed climbing 3-5 steps with a railing? : A Little 6 Click Score: 19    End of Session Equipment Utilized During Treatment: Gait belt Activity Tolerance: Patient tolerated treatment well Patient left: in bed;with call bell/phone within reach;with family/visitor present   PT Visit Diagnosis: Hemiplegia and hemiparesis Hemiplegia - Right/Left: Left Hemiplegia - dominant/non-dominant: Non-dominant Hemiplegia - caused by: Cerebral infarction    Time: 6599-3570 PT Time Calculation (min) (ACUTE ONLY): 22 min   Charges:   PT Evaluation $PT Eval Moderate Complexity: 1 Mod          02/27/2022 Margie, PT Acute Rehabilitation Services Office:  [phone]   Shanna Cisco 02/27/2022, 5:37 PM

## 2022-02-28 DIAGNOSIS — I10 Essential (primary) hypertension: Secondary | ICD-10-CM | POA: Diagnosis not present

## 2022-02-28 DIAGNOSIS — E7800 Pure hypercholesterolemia, unspecified: Secondary | ICD-10-CM | POA: Diagnosis not present

## 2022-02-28 DIAGNOSIS — L409 Psoriasis, unspecified: Secondary | ICD-10-CM | POA: Diagnosis not present

## 2022-02-28 DIAGNOSIS — Z8673 Personal history of transient ischemic attack (TIA), and cerebral infarction without residual deficits: Secondary | ICD-10-CM | POA: Diagnosis not present

## 2022-02-28 DIAGNOSIS — I639 Cerebral infarction, unspecified: Secondary | ICD-10-CM | POA: Diagnosis not present

## 2022-02-28 MED ORDER — ASPIRIN 325 MG PO TBEC: 325.0000 mg | DELAYED_RELEASE_TABLET | Freq: Every day | ORAL | 2 refills | Status: AC

## 2022-02-28 MED ORDER — LISINOPRIL 20 MG PO TABS: 40.0000 mg | ORAL_TABLET | Freq: Every day | ORAL | Status: DC

## 2022-02-28 MED ORDER — LISINOPRIL 20 MG PO TABS
20.0000 mg | ORAL_TABLET | Freq: Once | ORAL | Status: AC
  Administered 2022-02-28: 20 mg via ORAL
  Filled 2022-02-28: qty 1

## 2022-02-28 MED ORDER — ATORVASTATIN CALCIUM 80 MG PO TABS: 80.0000 mg | ORAL_TABLET | Freq: Every day | ORAL | 2 refills | Status: DC

## 2022-02-28 MED ORDER — AMLODIPINE BESYLATE 10 MG PO TABS: 10.0000 mg | ORAL_TABLET | Freq: Every day | ORAL | 2 refills | Status: DC

## 2022-02-28 MED ORDER — CLOPIDOGREL BISULFATE 75 MG PO TABS: 75.0000 mg | ORAL_TABLET | Freq: Every day | ORAL | 2 refills | Status: DC

## 2022-02-28 NOTE — TOC Initial Note (Signed)
Transition of Care [facility]) - Initial/Assessment Note    Patient Details  Name: Victoria Gutierrez MRN: [id number] Date of Birth: 03-15-1946  Transition of Care [facility]) CM/SW Contact:    Carles Collet, RN Phone Number: 02/28/2022, 2:50 PM  Clinical Narrative:                 Damaris Schooner w patient and daughter Nira Conn at bedside.  Discussed recs from PT. Patient and family requesting [facility] services. Referral made to [facility] per their request, abd accepted. Instructed to call daughter to set up times.  Patient will need HH PT order with face to face  Family requested RW and this will be delivered to the room tonight. They have 3/1 at home.  Patient and daughter feel there is a chance they will DC tonight, updated nurse that [facility] and DME are complete  Expected Discharge Plan: [place] Barriers to Discharge: Continued Medical Work up   Patient Goals and CMS Choice Patient states their goals for this hospitalization and ongoing recovery are:: to go home CMS Medicare.gov Compare Post Acute Care list provided to:: Other (Comment Required) Choice offered to / list presented to : Adult Children  Expected Discharge Plan and Services Expected Discharge Plan: [place]   Discharge Planning Services: CM Consult Post Acute Care Choice: Home Health, Durable Medical Equipment Living arrangements for the past 2 months: Single Family Home                 DME Arranged: Walker rolling DME Agency: [employer] Date DME Agency Contacted: 02/28/22 Time DME Agency Contacted: [phone] Representative spoke with at DME Agency: [place]: PT [place]: [facility] [place] Date [place]: 02/28/22 Time [place]: 70 Representative spoke with at [place]: [place] Arrangements/Services Living arrangements for the past 2 months: [place] you feel safe going back to the place where you live?: Yes               Activities  of Daily Living Home Assistive Devices/Equipment: None ADL Screening (condition at time of admission) Patient's cognitive ability adequate to safely complete daily activities?: Yes Is the patient deaf or have difficulty hearing?: No Does the patient have difficulty seeing, even when wearing glasses/contacts?: No Does the patient have difficulty concentrating, remembering, or making decisions?: Yes Patient able to express need for assistance with ADLs?: No Does the patient have difficulty dressing or bathing?: No Independently performs ADLs?: No Does the patient have difficulty walking or climbing stairs?: No Weakness of Legs: Both Weakness of Arms/Hands: None  Permission Sought/Granted                  Emotional Assessment              Admission diagnosis:  Stroke-like symptoms [R29.90] Cerebrovascular accident (CVA), unspecified mechanism ([place]) [I63.9] Patient Active Problem List   Diagnosis Date Noted   History of ischemic stroke 02/26/2022   Acute/subacute CVA 02/26/2022   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  02/26/2022   Tobacco abuse 02/26/2022   Displaced fracture of left femoral neck ([place]) 04/05/2019   Carotid stenosis 02/20/2019   Ischemic stroke ([place]) 01/20/2019   Localized osteoarthritis of hands, bilateral 09/23/2016   Multiple joint pain 08/25/2016   Psoriasis, unspecified 08/25/2016   Cerebral vascular disease 04/16/2014   Hyperlipidemia 04/16/2014   Hypertension 04/16/2014   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

## 2022-02-28 NOTE — Discharge Summary (Signed)
DISCHARGE SUMMARY  Victoria Gutierrez  MR#: [id number]  DOB:August 01, 1945  Date of Admission: 02/26/2022 Date of Discharge: 02/28/2022  Attending Physician:Tariya Morrissette Hennie Duos, MD  Patient's OQH:[id number], Jeneen Rinks, MD  Consults: Neurology / Stroke Team   Disposition: D/C home   Follow-up Appts:  [place], [facility] [facility] Follow up.   Specialty: [place] Why: for home health services Contact information: [place] [address] [phone]         Maryland Pink, MD Follow up in 1 week(s).   Specialty: Family Medicine Contact information: [address] [place] [address] [phone]                 Tests Needing Follow-up: -Neurology recommending a loop recorder as an outpatient -long-term systolic blood pressure goal 130-150  -Assure patient compliant with statin -querry patient on abstinence from tobacco  Discharge Diagnoses: Acute/subacute CVA Hypertension Hyperlipidemia Tobacco abuse  Initial presentation: 76 year old with a history of HTN, tobacco abuse, HLD, prior CVA, and psoriasis who presented to the ER with generalized weakness and body aches preceding the acute onset of a left-sided facial droop and slurring of speech.  In the ER CT head revealed a subacute infarct of the right caudate and lentiform nuclei as well as right internal capsule with other small infarcts within the right cerebellar hemisphere new since MRI in 2020.  Hospital Course:  Acute/subacute CVA acute/subacute CVA of right caudate and lentiform nuclei as well as right internal capsule - + smoker, HTN, HLD Neurology feels this likely represents large vessel disease versus cardioembolic source MRI brain notes acute infarcts in the right pons and right basal ganglia with severe chronic small vessel disease CTa head noted right M2/M3 and right ACA severe stenosis with left M1 chronic  occlusion and bilateral ICA siphon 50% stenosis TTE noted preserved EF with no obvious intracardiac source of embolization Bilateral lower extremity venous duplex without evidence of DVT A1c 5.4 LDL 116 Continue daily aspirin 325 mg and started Plavix to continue for 3 months then Plavix alone given large vessel intracranial stenosis PT/OT evals pending  Neurology recommending a loop recorder as an outpatient   Hypertension Allow for permissive HTN in setting of acute CVA for another 2-3 days -long-term systolic blood pressure goal 130-150 given known left M1 occlusion and severe intracranial stenosis at other locations   Hyperlipidemia Has not been taking her statin -resumed lipitor given LDL of 116   ?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 - ruled out ABIs bilateral lower extremity without evidence of significant hypoperfusion presently   Tobacco abuse Counseled on absolute need to discontinue smoking     Allergies as of 02/28/2022   No Known Allergies      Medication List     STOP taking these medications    ibuprofen 400 MG tablet Commonly known as: ADVIL       TAKE these medications    amLODipine 10 MG tablet Commonly known as: NORVASC Take 1 tablet (10 mg total) by mouth daily. Start taking on: March 01, 2022   aspirin EC 325 MG tablet Take 1 tablet (325 mg total) by mouth daily. Start taking on: March 01, 2022 What changed:  medication strength how much to take   atorvastatin 80 MG tablet Commonly known as: LIPITOR Take 1 tablet (80 mg total) by mouth daily at 6 PM. What changed:  medication strength how  much to take   citalopram 20 MG tablet Commonly known as: CELEXA Take 20 mg by mouth daily.   clopidogrel 75 MG tablet Commonly known as: PLAVIX Take 1 tablet (75 mg total) by mouth daily. Start taking on: March 01, 2022   hydrochlorothiazide 12.5 MG capsule Commonly known as: MICROZIDE Take 12.5 mg by mouth  daily.   lisinopril 40 MG tablet Commonly known as: ZESTRIL Take 40 mg by mouth daily.   Melatonin 5 MG Tbdp Take 5 mg by mouth at bedtime.   methotrexate 2.5 MG tablet Commonly known as: RHEUMATREX Take 12.5 mg by mouth every 'Sunday.               Durable Medical Equipment  (From admission, onward)           Start     Ordered   02/28/22 1414  For home use only DME Walker rolling  Once       Question Answer Comment  Walker: With 5 Inch Wheels   Patient needs a walker to treat with the following condition Weakness      09'$ /03/23 1414            Day of Discharge BP (!) 157/78 (BP Location: Left Arm)   Pulse (!) 110   Temp 98.2 F (36.8 C) (Oral)   Resp 18   Ht '5\' 6"'$  (1.676 m)   Wt 67.4 kg   SpO2 99%   BMI 23.98 kg/m   Physical Exam: General: No acute respiratory distress Lungs: Clear to auscultation bilaterally without wheezes or crackles Cardiovascular: Regular rate and rhythm without murmur gallop or rub normal S1 and S2 Abdomen: Nontender, nondistended, soft, bowel sounds positive, no rebound, no ascites, no appreciable mass Extremities: No significant cyanosis, clubbing, or edema bilateral lower extremities  Basic Metabolic Panel: Recent Labs  Lab 02/26/22 1349 02/26/22 1355  NA 137 140  K 4.2 4.2  CL 105 102  CO2 25  --   GLUCOSE 108* 108*  BUN 16 18  CREATININE 0.93 0.90  CALCIUM 9.2  --    CBC: Recent Labs  Lab 02/26/22 1349 02/26/22 1355  WBC 8.9  --   NEUTROABS 5.5  --   HGB 16.6* 16.7*  HCT 49.2* 49.0*  MCV 98.0  --   PLT 232  --     Time spent in discharge (includes decision making & examination of pt): 35 minutes  02/28/2022, 3:08 PM   Cherene Altes, MD [facility] Office  [phone]

## 2022-02-28 NOTE — Progress Notes (Signed)
STROKE TEAM PROGRESS NOTE   SUBJECTIVE (INTERVAL HISTORY) No family is at the bedside.  Patient reclining in bed, having lunch.  BP improved however still on the high end, increase lisinopril to 40 home dose.  PT therapy recommend outpatient.   OBJECTIVE Temp:  [98 F (36.7 C)-98.9 F (37.2 C)] 98.2 F (36.8 C) (09/03 0329) Pulse Rate:  [83-113] 113 (09/03 0900) Cardiac Rhythm: Normal sinus rhythm;Bundle branch block (09/03 0845) Resp:  [17-18] 18 (09/03 0900) BP: (160-201)/(78-115) 160/115 (09/03 0900) SpO2:  [94 %-98 %] 97 % (09/03 0900)  Recent Labs  Lab 02/26/22 1349  GLUCAP 124*   Recent Labs  Lab 02/26/22 1349 02/26/22 1355  NA 137 140  K 4.2 4.2  CL 105 102  CO2 25  --   GLUCOSE 108* 108*  BUN 16 18  CREATININE 0.93 0.90  CALCIUM 9.2  --    Recent Labs  Lab 02/26/22 1349  AST 17  ALT 15  ALKPHOS 56  BILITOT 0.8  PROT 6.4*  ALBUMIN 3.8   Recent Labs  Lab 02/26/22 1349 02/26/22 1355  WBC 8.9  --   NEUTROABS 5.5  --   HGB 16.6* 16.7*  HCT 49.2* 49.0*  MCV 98.0  --   PLT 232  --    Recent Labs  Lab 02/26/22 1530  CKTOTAL 86   Recent Labs    02/26/22 1530  LABPROT 13.5  INR 1.0   Recent Labs    02/26/22 1353  COLORURINE YELLOW  LABSPEC 1.043*  PHURINE 8.0  GLUCOSEU NEGATIVE  HGBUR NEGATIVE  BILIRUBINUR NEGATIVE  KETONESUR NEGATIVE  PROTEINUR NEGATIVE  NITRITE NEGATIVE  LEUKOCYTESUR NEGATIVE       Component Value Date/Time   CHOL 176 02/27/2022 0438   TRIG 59 02/27/2022 0438   HDL 48 02/27/2022 0438   CHOLHDL 3.7 02/27/2022 0438   VLDL 12 02/27/2022 0438   LDLCALC 116 (H) 02/27/2022 0438   Lab Results  Component Value Date   HGBA1C 5.4 02/26/2022      Component Value Date/Time   LABOPIA NONE DETECTED 02/26/2022 1353   COCAINSCRNUR NONE DETECTED 02/26/2022 1353   LABBENZ NONE DETECTED 02/26/2022 1353   AMPHETMU NONE DETECTED 02/26/2022 1353   THCU POSITIVE (A) 02/26/2022 1353   LABBARB NONE DETECTED 02/26/2022 1353     Recent Labs  Lab 02/26/22 1349  ETH <10    I have personally reviewed the radiological images below and agree with the radiology interpretations.  VAS [place] LOWER EXTREMITY VENOUS (DVT)  Result Date: 02/27/2022  Lower Venous DVT Study Patient Name:  Victoria Gutierrez  Date of Exam:   02/27/2022 Medical Rec #: [id number]       Accession #:    [id number] Date of Birth: 26-Aug-1945       Patient Gender: F Patient Age:   76 years Exam Location:  [facility] Procedure:      VAS [place] LOWER EXTREMITY VENOUS (DVT) Referring Phys: Cornelius Moras Beck Cofer --------------------------------------------------------------------------------  Indications: Stroke, and Pain.  Comparison Study: No prior study on file Performing Technologist: Sharion Dove RVS  Examination Guidelines: A complete evaluation includes B-mode imaging, spectral Doppler, color Doppler, and power Doppler as needed of all accessible portions of each vessel. Bilateral testing is considered an integral part of a complete examination. Limited examinations for reoccurring indications may be performed as noted. The reflux portion of the exam is performed with the patient in reverse Trendelenburg.  +---------+---------------+---------+-----------+----------+--------------+ RIGHT    CompressibilityPhasicitySpontaneityPropertiesThrombus Aging +---------+---------------+---------+-----------+----------+--------------+ CFV  Full           Yes      Yes                                 +---------+---------------+---------+-----------+----------+--------------+ SFJ      Full                                                        +---------+---------------+---------+-----------+----------+--------------+ FV Prox  Full                                                        +---------+---------------+---------+-----------+----------+--------------+ FV Mid   Full                                                         +---------+---------------+---------+-----------+----------+--------------+ FV DistalFull                                                        +---------+---------------+---------+-----------+----------+--------------+ PFV      Full                                                        +---------+---------------+---------+-----------+----------+--------------+ POP      Full           Yes      Yes                                 +---------+---------------+---------+-----------+----------+--------------+ PTV      Full                                                        +---------+---------------+---------+-----------+----------+--------------+ PERO     Full                                                        +---------+---------------+---------+-----------+----------+--------------+   +---------+---------------+---------+-----------+----------+--------------+ LEFT     CompressibilityPhasicitySpontaneityPropertiesThrombus Aging +---------+---------------+---------+-----------+----------+--------------+ CFV      Full           Yes      Yes                                 +---------+---------------+---------+-----------+----------+--------------+  SFJ      Full                                                        +---------+---------------+---------+-----------+----------+--------------+ FV Prox  Full                                                        +---------+---------------+---------+-----------+----------+--------------+ FV Mid   Full                                                        +---------+---------------+---------+-----------+----------+--------------+ FV DistalFull                                                        +---------+---------------+---------+-----------+----------+--------------+ PFV      Full                                                         +---------+---------------+---------+-----------+----------+--------------+ POP      Full           Yes      Yes                                 +---------+---------------+---------+-----------+----------+--------------+ PTV      Full                                                        +---------+---------------+---------+-----------+----------+--------------+ PERO     Full                                                        +---------+---------------+---------+-----------+----------+--------------+     Summary: BILATERAL: - No evidence of deep vein thrombosis seen in the lower extremities, bilaterally. -No evidence of popliteal cyst, bilaterally.   *See table(s) above for measurements and observations. Electronically signed by Monica Martinez MD on 02/27/2022 at 11:56:47 AM.    Final    VAS [place] ABI WITH/WO TBI  Result Date: 02/27/2022  LOWER EXTREMITY DOPPLER STUDY Patient Name:  MALEKA CONTINO  Date of Exam:   02/27/2022 Medical Rec #: [id number]       Accession #:    [id number] Date of Birth: 10-31-1945       Patient Gender: F Patient Age:  76 years Exam Location:  [facility] Procedure:      VAS [place] ABI WITH/WO TBI Referring Phys: Orma Flaming --------------------------------------------------------------------------------  Indications: Claudication. New CVA High Risk Factors: Hypertension, hyperlipidemia, prior CVA.  Comparison Study: No prior study Performing Technologist: Sharion Dove RVS  Examination Guidelines: A complete evaluation includes at minimum, Doppler waveform signals and systolic blood pressure reading at the level of bilateral brachial, anterior tibial, and posterior tibial arteries, when vessel segments are accessible. Bilateral testing is considered an integral part of a complete examination. Photoelectric Plethysmograph (PPG) waveforms and toe systolic pressure readings are included as required and additional duplex testing as needed. Limited examinations  for reoccurring indications may be performed as noted.  ABI Findings: +---------+------------------+-----+-----------+--------+ Right    Rt Pressure (mmHg)IndexWaveform   Comment  +---------+------------------+-----+-----------+--------+ Brachial 222                    triphasic           +---------+------------------+-----+-----------+--------+ PTA      148               0.67 multiphasic         +---------+------------------+-----+-----------+--------+ DP       135               0.61 multiphasic         +---------+------------------+-----+-----------+--------+ Great Toe115               0.52                     +---------+------------------+-----+-----------+--------+ +---------+------------------+-----+-----------+-------+ Left     Lt Pressure (mmHg)IndexWaveform   Comment +---------+------------------+-----+-----------+-------+ Brachial 210                    triphasic          +---------+------------------+-----+-----------+-------+ PTA      125               0.56 multiphasic        +---------+------------------+-----+-----------+-------+ DP       113               0.51 multiphasic        +---------+------------------+-----+-----------+-------+ Great Toe93                0.42                    +---------+------------------+-----+-----------+-------+ +-------+-----------+-----------+------------+------------+ ABI/TBIToday's ABIToday's TBIPrevious ABIPrevious TBI +-------+-----------+-----------+------------+------------+ Right  0.67       0.52                                +-------+-----------+-----------+------------+------------+ Left   0.56       0.42                                +-------+-----------+-----------+------------+------------+  Summary: Right: Resting right ankle-brachial index indicates moderate right lower extremity arterial disease. The right toe-brachial index is abnormal. Left: Resting left ankle-brachial index  indicates moderate left lower extremity arterial disease. The left toe-brachial index is abnormal. *See table(s) above for measurements and observations.  Vascular consult recommended. Electronically signed by Monica Martinez MD on 02/27/2022 at 11:52:22 AM.    Final    ECHOCARDIOGRAM COMPLETE  Result Date: 02/27/2022    ECHOCARDIOGRAM REPORT   Patient Name:   LATANA COLIN  Date of Exam: 02/27/2022 Medical Rec #:  [id number]      Height:       66.0 in Accession #:    [id number]     Weight:       148.6 lb Date of Birth:  [birth year]      BSA:          1.763 m Patient Age:    43 years       BP:           228/127 mmHg Patient Gender: F              HR:           105 bpm. Exam Location:  Inpatient Procedure: 2D Echo, Cardiac Doppler and Color Doppler Indications:    Stroke  History:        Patient has prior history of Echocardiogram examinations, most                 recent 01/21/2019. Risk Factors:Current Smoker, Hypertension and                 Dyslipidemia.; Medications:8.  Sonographer:    Clayton Lefort RDCS (AE) Referring Phys: [id number] [facility]  Sonographer Comments: Technically difficult study due to poor echo windows. IMPRESSIONS  1. Left ventricular ejection fraction, by estimation, is 65 to 70%. The left ventricle has normal function. The left ventricle has no regional wall motion abnormalities. Left ventricular diastolic parameters are consistent with Grade I diastolic dysfunction (impaired relaxation).  2. Right ventricular systolic function is normal. The right ventricular size is normal.  3. The mitral valve is abnormal. No evidence of mitral valve regurgitation. No evidence of mitral stenosis.  4. The aortic valve was not well visualized. There is mild calcification of the aortic valve. There is mild thickening of the aortic valve. Aortic valve regurgitation is not visualized. Aortic valve sclerosis is present, with no evidence of aortic valve  stenosis.  5. The inferior vena cava is normal in size with  greater than 50% respiratory variability, suggesting right atrial pressure of 3 mmHg. FINDINGS  Left Ventricle: Left ventricular ejection fraction, by estimation, is 65 to 70%. The left ventricle has normal function. The left ventricle has no regional wall motion abnormalities. The left ventricular internal cavity size was small. There is no left ventricular hypertrophy. Left ventricular diastolic parameters are consistent with Grade I diastolic dysfunction (impaired relaxation). Right Ventricle: The right ventricular size is normal. No increase in right ventricular wall thickness. Right ventricular systolic function is normal. Left Atrium: Left atrial size was normal in size. Right Atrium: Right atrial size was normal in size. Pericardium: There is no evidence of pericardial effusion. Mitral Valve: The mitral valve is abnormal. There is mild thickening of the mitral valve leaflet(s). There is mild calcification of the mitral valve leaflet(s). Mild mitral annular calcification. No evidence of mitral valve regurgitation. No evidence of mitral valve stenosis. Tricuspid Valve: The tricuspid valve is normal in structure. Tricuspid valve regurgitation is not demonstrated. No evidence of tricuspid stenosis. Aortic Valve: The aortic valve was not well visualized. There is mild calcification of the aortic valve. There is mild thickening of the aortic valve. Aortic valve regurgitation is not visualized. Aortic valve sclerosis is present, with no evidence of aortic valve stenosis. Aortic valve mean gradient measures 4.0 mmHg. Aortic valve peak gradient measures 6.9 mmHg. Aortic valve area, by VTI measures 3.95 cm. Pulmonic Valve: The pulmonic valve was normal in structure. Pulmonic valve  regurgitation is not visualized. No evidence of pulmonic stenosis. Aorta: The aortic root is normal in size and structure. Venous: The inferior vena cava is normal in size with greater than 50% respiratory variability, suggesting right  atrial pressure of 3 mmHg. IAS/Shunts: No atrial level shunt detected by color flow Doppler.  LEFT VENTRICLE PLAX 2D LVOT diam:     2.10 cm   Diastology LV SV:         77        LV e' lateral: 6.42 cm/s LV SV Index:   43 LVOT Area:     3.46 cm  RIGHT VENTRICLE RV S prime:     14.80 cm/s TAPSE (M-mode): 1.7 cm LEFT ATRIUM           Index        RIGHT ATRIUM           Index LA Vol (A4C): 44.2 ml 25.08 ml/m  RA Area:     13.30 cm                                    RA Volume:   33.50 ml  19.01 ml/m  AORTIC VALVE AV Area (Vmax):    3.52 cm AV Area (Vmean):   3.41 cm AV Area (VTI):     3.95 cm AV Vmax:           131.00 cm/s AV Vmean:          85.300 cm/s AV VTI:            0.194 m AV Peak Grad:      6.9 mmHg AV Mean Grad:      4.0 mmHg LVOT Vmax:         133.00 cm/s LVOT Vmean:        83.900 cm/s LVOT VTI:          0.221 m LVOT/AV VTI ratio: 1.14  AORTA Ao Root diam: 3.30 cm  SHUNTS Systemic VTI:  0.22 m Systemic Diam: 2.10 cm Jenkins Rouge MD Electronically signed by Jenkins Rouge MD Signature Date/Time: 02/27/2022/10:31:56 AM    Final    MR BRAIN WO CONTRAST  Result Date: 02/26/2022 CLINICAL DATA:  Transient ischemic attack (TIA). Left-sided weakness and facial droop. EXAM: MRI HEAD WITHOUT CONTRAST TECHNIQUE: Multiplanar, multiecho pulse sequences of the brain and surrounding structures were obtained without intravenous contrast. COMPARISON:  Head CT and CTA 02/26/2022.  Head MRI 01/20/2019. FINDINGS: The study is intermittently up to moderately motion degraded. Brain: There is a 1.2 cm acute infarct in the right ventral pons, and there is a 2.2 cm acute infarct anteriorly in the right basal ganglia involving the caudate and lentiform nuclei and intervening white matter. A chronic infarct is noted in the left basal ganglia with associated chronic blood products and ex vacuo dilatation of the left lateral ventricle. Patchy and confluent T2 hyperintensities elsewhere in the cerebral white matter bilaterally and in  the pons are similar to the prior MRI and are nonspecific but compatible with severe chronic small vessel ischemic disease. There are chronic lacunar infarcts in the cerebral white matter bilaterally and in the right cerebellar hemisphere with the latter being new from 2020. Wallerian degeneration involves the anterior body of the corpus callosum. There is mild cerebral atrophy. No mass, midline shift, or extra-axial fluid collection is identified. Vascular: Major intracranial vascular flow voids are preserved. Skull and upper cervical  spine: No suspicious marrow lesion. Sinuses/Orbits: Bilateral cataract extraction. Large mucous retention cyst in the left maxillary sinus. Mild mucosal thickening in the right maxillary sinus. Clear mastoid air cells. Other: None. IMPRESSION: 1. 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 2. Severe chronic small vessel ischemic disease. Electronically Signed   By: Logan Bores M.D.   On: 02/26/2022 17:33   CT ANGIO HEAD NECK W WO CM (CODE STROKE)  Result Date: 02/26/2022 CLINICAL DATA:  Code stroke EXAM: CT ANGIOGRAPHY HEAD AND NECK TECHNIQUE: Multidetector CT imaging of the head and neck was performed using the standard protocol during bolus administration of intravenous contrast. Multiplanar CT image reconstructions and MIPs were obtained to evaluate the vascular anatomy. Carotid stenosis measurements (when applicable) are obtained utilizing NASCET criteria, using the distal internal carotid diameter as the denominator. RADIATION DOSE REDUCTION: This exam was performed according to the departmental dose-optimization program which includes automated exposure control, adjustment of the mA and/or kV according to patient size and/or use of iterative reconstruction technique. CONTRAST:  69m OMNIPAQUE IOHEXOL 350 MG/ML SOLN COMPARISON:  Same-day noncontrast CT head, CTA head and neck 01/20/2019 FINDINGS: CTA NECK FINDINGS Aortic arch: There is mild calcified plaque in  the imaged aortic arch. The origins of the major branch vessels are patent with mild stenosis at the origin of the subclavian artery. The subclavian arteries are otherwise patent to the level imaged. Right carotid system: The right common carotid artery is patent. There is mixed plaque in the proximal right internal carotid artery resulting in up to approximately 50% stenosis. The distal right internal carotid artery is patent. The right external carotid artery is patent. There is no dissection or aneurysm. Left carotid system: The left common carotid artery is patent. There is mixed plaque in the proximal internal carotid artery resulting in up to approximately 40-50% stenosis. The distal left internal carotid artery is patent. The external carotid artery is patent. There is no dissection or aneurysm. Vertebral arteries: There is moderate stenosis of the origin of the right vertebral artery. The right vertebral artery is otherwise patent with mild plaque at the V3 segment. The non dominant left vertebral artery is patent without hemodynamically significant stenosis or occlusion. There is no dissection or aneurysm. Skeleton: There is no acute osseous abnormality or suspicious osseous lesion. Other neck: A right thyroid nodule is seen as seen on prior ultrasound. The soft tissues of the neck are otherwise unremarkable. Upper chest: The imaged lung apices are clear. Review of the MIP images confirms the above findings CTA HEAD FINDINGS Anterior circulation: There is calcified plaque in the intracranial ICAs resulting in mild stenosis of the right paraclinoid segment and no significant stenosis on the left. The right M1 and proximal M2 segments are patent. There is severe stenosis a distal right M2/proximal M3 branch in the distal sylvian fissure (8-101, 12-11) and additional severe stenosis of the same branch distally as it courses laterally over the cortex (8-108, 9-45), new since 2020. The left M1 segment is  occluded proximally. There is reconstitution of flow in the M2 branches which are overall decreased in caliber compared to the right. 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 The ACAs are otherwise patent proximally. There is multifocal severe stenosis/occlusion of the distal right ACA (for example 7-72), worsened since 2020 There is no aneurysm or AVM. Posterior circulation: There is a PICA termination of the left vertebral artery. There is dense calcification of the  proximal right V4 segment resulting in moderate stenosis. The basilar artery is diminutive distally and terminates at the superior cerebellar arteries, unchanged. The bilateral PCAs are patent with fetal origins bilaterally. There is no proximal stenosis or occlusion. There is no aneurysm or AVM. Venous sinuses: Patent. Anatomic variants: As above. Review of the MIP images confirms the above findings IMPRESSION: 1. No emergent large vessel occlusion. 2. 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 3. 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 4. Unchanged proximal left M1 occlusion with reconstitution but overall diminished caliber of the distal left MCA branches compared to the right. 5. Unchanged moderate stenosis of the right V4 segment and multifocal irregularity of the basilar artery which terminates at the superior cerebellar arteries. Fetal PCAs bilaterally are patent. 6. Unchanged moderate stenosis of the dominant right vertebral artery origin. 7. Calcified plaque at the carotid bifurcations resulting in approximately 50% stenosis on the right and 40-50% stenosis on the left. Electronically Signed   By: Valetta Mole M.D.   On: 02/26/2022 15:02   CT HEAD CODE STROKE WO CONTRAST  Result Date: 02/26/2022 CLINICAL DATA:  Provided history: Neuro deficit, acute, stroke suspected. Code stroke. EXAM: CT HEAD  WITHOUT CONTRAST TECHNIQUE: Contiguous axial images were obtained from the base of the skull through the vertex without intravenous contrast. RADIATION DOSE REDUCTION: This exam was performed according to the departmental dose-optimization program which includes automated exposure control, adjustment of the mA and/or kV according to patient size and/or use of iterative reconstruction technique. COMPARISON:  Brain MRI 01/20/2019. FINDINGS: Brain: Generalized cerebral atrophy. Abnormal hypodensity within portions of the right caudate and lentiform nuclei, as well as anterior limb of right internal capsule. Findings are compatible with an acute/early subacute infarction. Redemonstrated chronic lacunar infarct within the left caudate nucleus. Advanced patchy and ill-defined hypoattenuation within the cerebral white matter, nonspecific but compatible with chronic small vessel ischemic disease. Small infarcts within the right cerebellar hemisphere, new from the prior brain MRI of 01/20/2019 but otherwise age-indeterminate. There is no acute intracranial hemorrhage. No demarcated cortical infarct is identified. No extra-axial fluid collection. No evidence of an intracranial mass. No midline shift. Vascular: No hyperdense vessel. Atherosclerotic calcifications. Skull: No fracture or aggressive osseous lesion. Sinuses/Orbits: No mass or acute finding within the imaged orbits. Mild mucosal thickening within the right maxillary sinus. 3.3 cm mucous retention cyst within the left maxillary sinus. Small mucous retention cyst, and background trace mucosal thickening, within the right sphenoid sinus. ASPECTS ([place] Stroke Program Early CT Score) - Ganglionic level infarction (caudate, lentiform nuclei, internal capsule, insula, M1-M3 cortex): 4 - Supraganglionic infarction (M4-M6 cortex): 3 Total score (0-10 with 10 being normal): 7 Pertinent results were called by telephone at the time of interpretation on 02/26/2022 at 2:10 pm  to provider Dr. Leonel Ramsay, who verbally acknowledged these results. IMPRESSION: 1. Acute/subacute infarct affecting portions of the right caudate and lentiform nuclei, as well as anterior limb of right internal capsule. ASPECTS is 7. 2. Small infarcts within the right cerebellar hemisphere, new from the prior brain MRI of 01/20/2019 but otherwise age-indeterminate. 3. Advanced chronic small vessel ischemic changes within the cerebral white matter. 4. Cerebral atrophy. 5. Paranasal sinus disease, as described. Electronically Signed   By: Kellie Simmering D.O.   On: 02/26/2022 14:30     PHYSICAL EXAM  Temp:  [98 F (36.7 C)-98.9 F (37.2 C)] 98.2 F (36.8 C) (09/03 0329) Pulse Rate:  [83-113]  113 (09/03 0900) Resp:  [17-18] 18 (09/03 0900) BP: (160-201)/(78-115) 160/115 (09/03 0900) SpO2:  [94 %-98 %] 97 % (09/03 0900)  General - Well nourished, well developed, in no apparent distress.  Ophthalmologic - fundi not visualized due to noncooperation.  Cardiovascular - Regular rhythm and rate.  Neuro - awake, alert, flat affect, eyes open, orientated to age, place, time and people. No aphasia, paucity of speech, following all simple commands. Able to name and repeat. No gaze palsy, tracking bilaterally, visual field full. Mild left facial droop. Tongue midline. Bilateral UEs 4/5, no drift but left hand grip 4/5. Bilaterally LEs 3/5, no drift. Sensation symmetrical bilaterally, b/l FTN intact, gait not tested.     ASSESSMENT/PLAN [name] is a 76 y.o. female with history of hypertension, smoker, THC user, strokes admitted for left-sided weakness and left facial droop. No tPA given due to outside window.    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 Large/small vessel disease CT head acute right caudate head infarct, old right cerebellum infarct but new since 12/2018 CTA head and neck right M2/M3 and right ACA severe stenosis,  left M1 chronic occlusion, bilateral ICA siphon 50% stenosis.  Right VA stenosis, basilar artery irregular, bilateral fetal PCAs. MRI right pontine and right caudate head infarcts 2D Echo EF 65 to 70% LE venous Doppler no DVT Recommend loop recorder to rule out afib. Can be done inpt or outpt LDL 116 HgbA1c 5.4 UDS positive for THC SCDs for VTE prophylaxis aspirin 81 mg daily prior to admission, now on aspirin 325 mg daily and clopidogrel 75 mg daily DAPT for 3 months and then Plavix alone given intracranial large vessel stenosis. Patient counseled to be compliant with her antithrombotic medications Ongoing aggressive stroke risk factor management Therapy recommendations: outpt PT/OT Disposition: Pending  History of stroke Intra- and extracranial stenosis 2012 MRI showed left MCA scattered infarcts, Left M1 still patent on MRI. 2018 MRI showed left M1 occlusion 12/2018 admitted for right sided weakness, MRI showed left MCA small infarcts, old left caudate and CR infarcts. CTA head and neck showed chronic proximal left M1 occlusion with distal collaterals. 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 Tandem stenosis of the right vertebral artery at the C6 level. B/l dense calcification at ICA bulbs and hypoplastic BA.  EF 55%.  LDL 105, A1c 5.7, UDS positive for THC.  Discharged on DAPT and Lipitor 40.  Outpatient 30-day Cardio event monitor negative for A-fib.  Hypertensive urgency Unstable BP persistently up to 200s Now resumed Lisinopril 40 and HCTZ 12.5 Add amlodipine '10mg'$  Gradually down to BP target in 3-5 days Long term BP goal 130-150 given left M1 occlusion, severe intracranial stenosis  Hyperlipidemia Home meds: Lipitor 40 LDL 116, goal < 70 Now on lipitor 80 Continue statin at discharge  Tobacco abuse Current smoker Smoking cessation counseling provided Nicotine patch provided Pt is willing to quit  Other Stroke Risk Factors Advanced  age Chronic THC abuse, cessation education provided  Other [facility] day # 2     Rosalin Hawking, MD PhD Stroke Neurology 02/28/2022 11:53 AM    To contact Stroke Continuity provider, please refer to [email or web address] After hours, contact General Neurology

## 2022-02-28 NOTE — Evaluation (Signed)
Occupational Therapy Evaluation Patient Details Name: Victoria Gutierrez MRN: [id number] DOB: 03/10/1946 Today's Date: 02/28/2022   History of Present Illness Pt is a 76 y.o. female admitted with acute/subacute CVA of right caudate and lentiform nuclei as well as right internal capsule. Small infarcts w/in the right cerebellar hemisphere are also new since MRI in 12/2018. PMH significant for HTN, HLD, hx of CVA, psoriasis, THR.   Clinical Impression   Pt admitted for concerns listed above. PTA pt reported that she was independent with all ADL's and IADL's, including driving. At this time, pt presents with weakness, balance deficits, decreased safety awareness, and mild L sided inattention/potential visual cut.  She is requiring min guard overall with intermittent Min A for safety and stability. Recommending OP OT at a neuro clinic to maximize independence and safety. OT will follow acutely.      Recommendations for follow up therapy are one component of a multi-disciplinary discharge planning process, led by the attending physician.  Recommendations may be updated based on patient status, additional functional criteria and insurance authorization.   Follow Up Recommendations  Outpatient OT    Assistance Recommended at Discharge Frequent or constant Supervision/Assistance  Patient can return home with the following A little help with walking and/or transfers;A little help with bathing/dressing/bathroom;Assistance with cooking/housework;Direct supervision/assist for medications management    Functional Status Assessment  Patient has had a recent decline in their functional status and demonstrates the ability to make significant improvements in function in a reasonable and predictable amount of time.  Equipment Recommendations  Tub/shower seat    Recommendations for Other Services       Precautions / Restrictions Precautions Precautions: Fall Restrictions Weight Bearing Restrictions: No       Mobility Bed Mobility Overal bed mobility: Needs Assistance Bed Mobility: Supine to Sit, Sit to Supine     Supine to sit: Min guard Sit to supine: Min guard   General bed mobility comments: Min gaurd for safety with heavy use of rails    Transfers Overall transfer level: Needs assistance Equipment used: Rolling walker (2 wheels) Transfers: Sit to/from Stand Sit to Stand: Min assist, Min guard           General transfer comment: Min A from bed, min guard from chair with rails      Balance Overall balance assessment: Needs assistance Sitting-balance support: No upper extremity supported, Feet supported Sitting balance-Leahy Scale: Fair     Standing balance support: No upper extremity supported Standing balance-Leahy Scale: Poor Standing balance comment: poor ability to tolerate challenge, needs mina                           ADL either performed or assessed with clinical judgement   ADL Overall ADL's : Needs assistance/impaired Eating/Feeding: Set up;Sitting   Grooming: Set up;Sitting   Upper Body Bathing: Set up;Sitting   Lower Body Bathing: Min guard;Sitting/lateral leans;Sit to/from stand   Upper Body Dressing : Set up;Sitting   Lower Body Dressing: Min guard;Sitting/lateral leans;Sit to/from stand   Toilet Transfer: Min guard;Ambulation   Toileting- Clothing Manipulation and Hygiene: Min guard;Sitting/lateral lean;Sit to/from stand       Functional mobility during ADLs: Min guard;Rolling walker (2 wheels) General ADL Comments: Overall min guard due to weakness, balance deficits, and L sided inattention     Vision Baseline Vision/History: 1 Wears glasses Ability to See in Adequate Light: 0 Adequate Patient Visual Report: No change from baseline Vision Assessment?:  Vision impaired- to be further tested in functional context Additional Comments: L sided inattention/field cut     Perception     Praxis      Pertinent Vitals/Pain Pain  Assessment Pain Assessment: No/denies pain     Hand Dominance Right   Extremity/Trunk Assessment Upper Extremity Assessment Upper Extremity Assessment: LUE deficits/detail LUE Deficits / Details: some weakness noted compared to right side.  Unable to fully elevate shoulder to raise hand, grip weaker compared to right. LUE Coordination: decreased fine motor;decreased gross motor   Lower Extremity Assessment Lower Extremity Assessment: Defer to PT evaluation   Cervical / Trunk Assessment Cervical / Trunk Assessment: Normal   Communication Communication Communication: No difficulties   Cognition Arousal/Alertness: Awake/alert Behavior During Therapy: Flat affect Overall Cognitive Status: Impaired/Different from baseline Area of Impairment: Orientation, Following commands, Safety/judgement, Problem solving                 Orientation Level: Disoriented to, Time     Following Commands: Follows one step commands consistently, Follows one step commands with increased time Safety/Judgement: Decreased awareness of deficits, Decreased awareness of safety   Problem Solving: Slow processing, Decreased initiation, Requires verbal cues General Comments: Pt not aware of date/month, with decreased L sided awareness and cogntive deficits.     General Comments  VSS on RA    Exercises     Shoulder Instructions      Home Living Family/patient expects to be discharged to:: Private residence Living Arrangements: Children Available Help at Discharge: Family;Available 24 hours/day Type of Home: House Home Access: Stairs to enter [employer] of Steps: 2 Entrance Stairs-Rails: None Home Layout: Multi-level Alternate Level Stairs-Number of Steps: 2 steps to go from living area to bedroom area Alternate Level Stairs-Rails: None Bathroom Shower/Tub: Occupational psychologist: Standard     Home Equipment: Cane - single point          Prior  Functioning/Environment Prior Level of Function : Independent/Modified Independent                        OT Problem List: Decreased strength;Decreased activity tolerance;Impaired balance (sitting and/or standing);Decreased safety awareness;Impaired sensation;Impaired UE functional use;Impaired vision/perception;Decreased coordination      OT Treatment/Interventions: Self-care/ADL training;Therapeutic exercise;Neuromuscular education;Energy conservation;DME and/or AE instruction;Therapeutic activities;Cognitive remediation/compensation;Patient/family education;Balance training;Visual/perceptual remediation/compensation    OT Goals(Current goals can be found in the care plan section) Acute Rehab OT Goals Patient Stated Goal: To go home OT Goal Formulation: With patient Time For Goal Achievement: 03/14/22 Potential to Achieve Goals: Good ADL Goals Pt Will Perform Grooming: with modified independence;standing Pt Will Perform Lower Body Bathing: with modified independence;sitting/lateral leans;sit to/from stand Pt Will Perform Lower Body Dressing: with modified independence;sit to/from stand;sitting/lateral leans Pt Will Transfer to Toilet: with modified independence;ambulating Pt Will Perform Toileting - Clothing Manipulation and hygiene: with modified independence;sitting/lateral leans;sit to/from stand  OT Frequency: Min 2X/week    Co-evaluation              AM-PAC OT "6 Clicks" Daily Activity     Outcome Measure Help from another person eating meals?: A Little Help from another person taking care of personal grooming?: A Little Help from another person toileting, which includes using toliet, bedpan, or urinal?: A Little Help from another person bathing (including washing, rinsing, drying)?: A Little Help from another person to put on and taking off regular upper body clothing?: A Little Help from another person to put on and  taking off regular lower body clothing?: A  Little 6 Click Score: 18   End of Session Equipment Utilized During Treatment: Gait belt;Rolling walker (2 wheels) Nurse Communication: Mobility status  Activity Tolerance: Patient tolerated treatment well Patient left: in bed;with call bell/phone within reach  OT Visit Diagnosis: Unsteadiness on feet (R26.81);Other abnormalities of gait and mobility (R26.89);Muscle weakness (generalized) (M62.81)                Time: 6286-3817 OT Time Calculation (min): 13 min Charges:  OT General Charges $OT Visit: 1 Visit OT Evaluation $OT Eval Moderate Complexity: [place], OTR/L [facility], Acute Rehab  Wesleigh Markovic Elane Yolanda Bonine 02/28/2022, 4:18 PM

## 2022-02-28 NOTE — Plan of Care (Signed)
  Problem: Education: Goal: Knowledge of General Education information will improve Description: Including pain rating scale, medication(s)/side effects and non-pharmacologic comfort measures Outcome: Adequate for Discharge   Problem: Health Behavior/Discharge Planning: Goal: Ability to manage health-related needs will improve Outcome: Adequate for Discharge   Problem: Clinical Measurements: Goal: Ability to maintain clinical measurements within normal limits will improve Outcome: Adequate for Discharge Goal: Will remain free from infection Outcome: Adequate for Discharge Goal: Diagnostic test results will improve Outcome: Adequate for Discharge Goal: Respiratory complications will improve Outcome: Adequate for Discharge Goal: Cardiovascular complication will be avoided Outcome: Adequate for Discharge   Problem: Activity: Goal: Risk for activity intolerance will decrease Outcome: Adequate for Discharge   Problem: Nutrition: Goal: Adequate nutrition will be maintained Outcome: Adequate for Discharge   Problem: Coping: Goal: Level of anxiety will decrease Outcome: Adequate for Discharge   Problem: Elimination: Goal: Will not experience complications related to bowel motility Outcome: Adequate for Discharge Goal: Will not experience complications related to urinary retention Outcome: Adequate for Discharge   Problem: Pain Managment: Goal: General experience of comfort will improve Outcome: Adequate for Discharge   Problem: Safety: Goal: Ability to remain free from injury will improve Outcome: Adequate for Discharge   Problem: Skin Integrity: Goal: Risk for impaired skin integrity will decrease Outcome: Adequate for Discharge   Problem: Education: Goal: Knowledge of disease or condition will improve Outcome: Adequate for Discharge Goal: Knowledge of secondary prevention will improve (SELECT ALL) Outcome: Adequate for Discharge Goal: Knowledge of patient specific  risk factors will improve (INDIVIDUALIZE FOR PATIENT) Outcome: Adequate for Discharge   Problem: Coping: Goal: Will verbalize positive feelings about self Outcome: Adequate for Discharge   Problem: Self-Care: Goal: Ability to participate in self-care as condition permits will improve Outcome: Adequate for Discharge Goal: Verbalization of feelings and concerns over difficulty with self-care will improve Outcome: Adequate for Discharge   Problem: Nutrition: Goal: Risk of aspiration will decrease Outcome: Adequate for Discharge   Problem: Ischemic Stroke/TIA Tissue Perfusion: Goal: Complications of ischemic stroke/TIA will be minimized Outcome: Adequate for Discharge

## 2022-02-28 NOTE — Progress Notes (Signed)
Physical Therapy Treatment Patient Details Name: Victoria Gutierrez MRN: [id number] DOB: May 30, 1946 Today's Date: 02/28/2022   History of Present Illness Pt is a 76 y.o. female admitted with acute/subacute CVA of right caudate and lentiform nuclei as well as right internal capsule. Small infarcts w/in the right cerebellar hemisphere are also new since MRI in 12/2018. PMH significant for HTN, HLD, hx of CVA, psoriasis, THR.    PT Comments    Pt seen for continued mobility and balance progression. Able to tolerate increased ambulation distance without use of DME or overt LOB but needs minA to steady, especially with addition of balance challenges such as marching, head turns, or changes in direction. The pt continues to present with flat affect and needs intermittent cues for safety. Continue to recommend further acute PT prior to return home, but pt is making good progress. Given family support will continue to work towards d/c home.     Recommendations for follow up therapy are one component of a multi-disciplinary discharge planning process, led by the attending physician.  Recommendations may be updated based on patient status, additional functional criteria and insurance authorization.  Follow Up Recommendations  Outpatient PT (OP neuro)     Assistance Recommended at Discharge Intermittent Supervision/Assistance  Patient can return home with the following A little help with walking and/or transfers;A little help with bathing/dressing/bathroom;Help with stairs or ramp for entrance;Assistance with cooking/housework   Equipment Recommendations  Rolling walker (2 wheels)    Recommendations for Other Services       Precautions / Restrictions Precautions Precautions: Fall Precaution Comments: poor safety awareness Restrictions Weight Bearing Restrictions: No     Mobility  Bed Mobility Overal bed mobility: Needs Assistance Bed Mobility: Supine to Sit     Supine to sit: Min assist      General bed mobility comments: minA to maintain balance and complete trunk elevation    Transfers Overall transfer level: Needs assistance Equipment used: Rolling walker (2 wheels), None Transfers: Sit to/from Stand Sit to Stand: Min assist, Min guard           General transfer comment: minA for initial stand with use of RW as pt pulling on RW and with posterior LOB. minG without RW without LOB    Ambulation/Gait Ambulation/Gait assistance: Min Web designer (Feet): 150 Feet Assistive device: 1 person hand held assist Gait Pattern/deviations: Decreased stride length, Step-through pattern, Decreased step length - left, Shuffle Gait velocity: decreased Gait velocity interpretation: <1.31 ft/sec, indicative of household ambulator   General Gait Details: pt wtih small shuffling steps and minimal step clearance or length with LLE. no overt LOB but increased difficulty with turning to R and avoiding obstacles on left.    Modified Rankin (Stroke Patients Only) Modified Rankin (Stroke Patients Only) Pre-Morbid Rankin Score: No symptoms Modified Rankin: Moderately severe disability     Balance Overall balance assessment: Needs assistance Sitting-balance support: No upper extremity supported, Feet supported Sitting balance-Leahy Scale: Fair Sitting balance - Comments: able to lean outside BOS but slow and cautious   Standing balance support: No upper extremity supported Standing balance-Leahy Scale: Poor Standing balance comment: poor ability to tolerate challenge, needs mina             High level balance activites: Direction changes, Head turns, Other (comment) (walking marches) High Level Balance Comments: minA to maintain with single UE support.            Cognition Arousal/Alertness: Awake/alert Behavior During Therapy: Flat affect Overall Cognitive  Status: Impaired/Different from baseline Area of Impairment: Orientation, Following commands,  Safety/judgement, Problem solving                 Orientation Level: Disoriented to, Time     Following Commands: Follows one step commands consistently, Follows one step commands with increased time Safety/Judgement: Decreased awareness of deficits, Decreased awareness of safety   Problem Solving: Slow processing, Decreased initiation, Requires verbal cues General Comments: pt stating date was august 31, correctly identified sunday. flat affect with cues for safety, awareness of positioning, and sequencing at times.        Exercises      General Comments General comments (skin integrity, edema, etc.): HR to 145bpm, tele called to say pt had run of vtach while walking. no SOB or symptoms from pt, had maintained conversation without issue throughout episode      Pertinent Vitals/Pain Pain Assessment Pain Assessment: No/denies pain     PT Goals (current goals can now be found in the care plan section) Acute Rehab PT Goals Patient Stated Goal: to go home PT Goal Formulation: With patient/family Time For Goal Achievement: 03/06/22 Potential to Achieve Goals: Good Progress towards PT goals: Progressing toward goals    Frequency    Min 4X/week      PT Plan Current plan remains appropriate       AM-PAC PT "6 Clicks" Mobility   Outcome Measure  Help needed turning from your back to your side while in a flat bed without using bedrails?: None Help needed moving from lying on your back to sitting on the side of a flat bed without using bedrails?: A Little Help needed moving to and from a bed to a chair (including a wheelchair)?: A Little Help needed standing up from a chair using your arms (e.g., wheelchair or bedside chair)?: A Little Help needed to walk in hospital room?: A Little Help needed climbing 3-5 steps with a railing? : A Little 6 Click Score: 19    End of Session Equipment Utilized During Treatment: Gait belt Activity Tolerance: Patient tolerated  treatment well Patient left: in chair;with call bell/phone within reach;with chair alarm set Nurse Communication: Mobility status PT Visit Diagnosis: Hemiplegia and hemiparesis Hemiplegia - Right/Left: Left Hemiplegia - dominant/non-dominant: Non-dominant Hemiplegia - caused by: Cerebral infarction     Time: 0825-0852 PT Time Calculation (min) (ACUTE ONLY): 27 min  Charges:  $Gait Training: 8-22 mins $Therapeutic Activity: 8-22 mins                     West Carbo, PT, DPT   Acute Rehabilitation Department   Sandra Cockayne 02/28/2022, 9:21 AM

## 2022-03-02 DIAGNOSIS — F17210 Nicotine dependence, cigarettes, uncomplicated: Secondary | ICD-10-CM | POA: Diagnosis not present

## 2022-03-02 DIAGNOSIS — G8194 Hemiplegia, unspecified affecting left nondominant side: Secondary | ICD-10-CM | POA: Diagnosis not present

## 2022-03-02 DIAGNOSIS — I10 Essential (primary) hypertension: Secondary | ICD-10-CM | POA: Diagnosis not present

## 2022-03-09 DIAGNOSIS — I69328 Other speech and language deficits following cerebral infarction: Secondary | ICD-10-CM | POA: Diagnosis not present

## 2022-03-09 DIAGNOSIS — I69354 Hemiplegia and hemiparesis following cerebral infarction affecting left non-dominant side: Secondary | ICD-10-CM | POA: Diagnosis not present

## 2022-03-09 DIAGNOSIS — E785 Hyperlipidemia, unspecified: Secondary | ICD-10-CM | POA: Diagnosis not present

## 2022-03-09 DIAGNOSIS — I10 Essential (primary) hypertension: Secondary | ICD-10-CM | POA: Diagnosis not present

## 2022-03-09 DIAGNOSIS — M19041 Primary osteoarthritis, right hand: Secondary | ICD-10-CM | POA: Diagnosis not present

## 2022-03-09 DIAGNOSIS — M19042 Primary osteoarthritis, left hand: Secondary | ICD-10-CM | POA: Diagnosis not present

## 2022-03-09 DIAGNOSIS — I69392 Facial weakness following cerebral infarction: Secondary | ICD-10-CM | POA: Diagnosis not present

## 2022-03-22 ENCOUNTER — Ambulatory Visit: Admit: 2022-03-22 | Discharge: 2022-03-22 | Payer: MEDICARE

## 2022-03-22 ENCOUNTER — Ambulatory Visit
Admit: 2022-03-22 | Discharge: 2022-03-22 | Payer: MEDICARE | Attending: Student in an Organized Health Care Education/Training Program | Primary: Student in an Organized Health Care Education/Training Program

## 2022-03-22 DIAGNOSIS — Z79899 Other long term (current) drug therapy: Principal | ICD-10-CM

## 2022-03-22 DIAGNOSIS — L400 Psoriasis vulgaris: Principal | ICD-10-CM

## 2022-03-22 DIAGNOSIS — L409 Psoriasis, unspecified: Principal | ICD-10-CM

## 2022-03-22 MED ORDER — TREMFYA 100 MG/ML SUBCUTANEOUS AUTO-INJECTOR
SUBCUTANEOUS | 0 refills | 0.00000 days | Status: CP
Start: 2022-03-22 — End: 2022-04-22

## 2022-03-25 MED ORDER — COSENTYX PEN 300 MG/2 PENS (150 MG/ML) SUBCUTANEOUS
SUBCUTANEOUS | 5 refills | 28.00000 days | Status: CP
Start: 2022-03-25 — End: ?

## 2022-03-25 MED ORDER — COSENTYX PEN 150 MG/ML SUBCUTANEOUS
SUBCUTANEOUS | 0 refills | 0.00000 days | Status: CP
Start: 2022-03-25 — End: ?

## 2022-03-29 DIAGNOSIS — L400 Psoriasis vulgaris: Principal | ICD-10-CM

## 2022-03-29 DIAGNOSIS — M05742 Rheumatoid arthritis with rheumatoid factor of left hand without organ or systems involvement: Principal | ICD-10-CM

## 2022-03-29 DIAGNOSIS — R262 Difficulty in walking, not elsewhere classified: Secondary | ICD-10-CM | POA: Diagnosis not present

## 2022-03-29 DIAGNOSIS — M6281 Muscle weakness (generalized): Secondary | ICD-10-CM | POA: Diagnosis not present

## 2022-03-29 DIAGNOSIS — M05741 Rheumatoid arthritis with rheumatoid factor of right hand without organ or systems involvement: Principal | ICD-10-CM

## 2022-04-01 DIAGNOSIS — M6281 Muscle weakness (generalized): Secondary | ICD-10-CM | POA: Diagnosis not present

## 2022-04-01 DIAGNOSIS — R262 Difficulty in walking, not elsewhere classified: Secondary | ICD-10-CM | POA: Diagnosis not present

## 2022-04-05 DIAGNOSIS — R262 Difficulty in walking, not elsewhere classified: Secondary | ICD-10-CM | POA: Diagnosis not present

## 2022-04-05 DIAGNOSIS — M6281 Muscle weakness (generalized): Secondary | ICD-10-CM | POA: Diagnosis not present

## 2022-04-12 NOTE — Progress Notes (Deleted)
[facility] [address] [place]. [place] [address] [phone]       HOSPITAL FOLLOW UP NOTE  Ms. [name] Date of Birth:  28-Sep-1945 Medical Record Number:  [id number]   Reason for Referral:  hospital stroke follow up    SUBJECTIVE:   CHIEF COMPLAINT:  No chief complaint on file.   HPI:   Ms. CYRAH MCLAMB is a 76 y.o. female with history of hypertension, smoker, THC user, and hx of prior strokes who presented on 02/26/2022 with left-sided weakness and left facial droop as well as hypertensive urgency. No tPA given due to outside window.  Evaluated by Dr. 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  CTA head/neck showed right M2/M3 and right ICA severe stenosis, left M1 chronic occlusion, bilateral ICA siphon 50% stenosis, R VA stenosis, BA irregular and bilateral fetal PCAs. EF 65-70%. LE doppler negative DVT.  LDL 116.  A1c 5.4.  UDS positive for THC.  Recommended ILR to rule out A fib.  Recommended DAPT for 3 months then Plavix alone as on aspirin PTA.  Multiple prior strokes in 2012, 2018 and 2020. Completed cardiac monitor in 2020 which was negative for A-fib.  BP gradually stabilized and resumed home dose lisinopril and hydrochlorothiazide and added amlodipine, long-term BP goal 130-150 given intracranial stenosis.  Increase atorvastatin dosage from 40 mg to 80 mg daily.  Current tobacco and THC use with cessation counseling provided.  Therapy eval's recommended outpatient PT/OT.        PERTINENT IMAGING  Per hospitalization 02/26/2022 - *** CT head acute right caudate head infarct, old right cerebellum infarct but new since 12/2018 CTA head and neck right M2/M3 and right ACA severe stenosis, left M1 chronic occlusion, bilateral ICA siphon 50% stenosis.  Right VA stenosis, basilar artery irregular, bilateral fetal PCAs. MRI right pontine and right caudate head infarcts 2D Echo EF  65 to 70% LE venous Doppler no DVT Recommend loop recorder to rule out afib. Can be done inpt or outpt LDL 116 HgbA1c 5.4 UDS positive for THC    ROS:   14 system review of systems performed and negative with exception of ***  PMH:  Past Medical History:  Diagnosis Date   Arthritis    knees, hands   H/O thyroid disease    No issues currently   HOH (hard of hearing)    Hypertension    Stroke ([place]) 12/2018   Wears hearing aid in both ears    has, does not wear    PSH:  Past Surgical History:  Procedure Laterality Date   ABDOMINAL HYSTERECTOMY     CATARACT EXTRACTION W/PHACO Right 09/18/2019   Procedure: CATARACT EXTRACTION PHACO AND INTRAOCULAR LENS PLACEMENT ([place]) RIGHT 11.65  01:03.3;  Surgeon: Birder Robson, MD;  Location: [facility];  Service: Ophthalmology;  Laterality: Right;   CATARACT EXTRACTION W/PHACO Left 10/09/2019   Procedure: CATARACT EXTRACTION PHACO AND INTRAOCULAR LENS PLACEMENT ([facility]) LEFT 10.30  00:48.5;  Surgeon: Birder Robson, MD;  Location: [facility];  Service: Ophthalmology;  Laterality: Left;   TOTAL HIP ARTHROPLASTY Left 04/05/2019   Procedure: TOTAL HIP ARTHROPLASTY ANTERIOR APPROACH;  Surgeon: Hessie Knows, MD;  Location: [facility];  Service: Orthopedics;  Laterality: Left;    Social History:  Social History   Socioeconomic History   Marital status: Divorced    Spouse name: Not on file   Number of children: Not on file  Years of education: Not on file   Highest education level: Not on file  Occupational History   Not on file  Tobacco Use   Smoking status: Every Day    Packs/day: 0.50    Years: 38.00    Total pack years: 19.00    Types: Cigarettes    Start date: 03/10/1987   Smokeless tobacco: Never   Tobacco comments:    since age 56  Vaping Use   Vaping Use: Never used  Substance and Sexual Activity   Alcohol use: No   Drug use: No   Sexual activity: Not on file  Other Topics Concern   Not on file   Social History Narrative   Not on file   Social Determinants of Health   Financial Resource Strain: Low Risk  (06/09/2017)   Overall Financial Resource Strain (CARDIA)    Difficulty of Paying Living Expenses: Not hard at all  Food Insecurity: Unknown (06/09/2017)   Hunger Vital Sign    Worried About Running Out of Food in the Last Year: Patient refused    [place] in the Last Year: Patient refused  Transportation Needs: Unknown (06/09/2017)   PRAPARE - Hydrologist (Medical): Patient refused    Lack of Transportation (Non-Medical): Patient refused  Physical Activity: Unknown (06/09/2017)   Exercise Vital Sign    Days of Exercise per Week: Patient refused    Minutes of Exercise per Session: Patient refused  Stress: No Stress Concern Present (06/09/2017)   [place] of Stress : Not at all  Social Connections: Unknown (06/09/2017)   Social Connection and Isolation Panel [NHANES]    Frequency of Communication with Friends and Family: Patient refused    Frequency of Social Gatherings with Friends and Family: Patient refused    Attends Religious Services: Patient refused    Active Member of Clubs or Organizations: Patient refused    Attends Archivist Meetings: Patient refused    Marital Status: Patient refused  Intimate Partner Violence: Unknown (06/09/2017)   Humiliation, Afraid, Rape, and Kick questionnaire    Fear of Current or Ex-Partner: Patient refused    Emotionally Abused: Patient refused    Physically Abused: Patient refused    Sexually Abused: Patient refused    Family History:  Family History  Problem Relation Age of Onset   Heart disease Mother    Heart disease Father    Breast cancer Neg Hx     Medications:   Current Outpatient Medications on File Prior to Visit  Medication Sig Dispense Refill   amLODipine (NORVASC) 10 MG tablet Take 1  tablet (10 mg total) by mouth daily. 30 tablet 2   aspirin EC 325 MG tablet Take 1 tablet (325 mg total) by mouth daily. 30 tablet 2   atorvastatin (LIPITOR) 80 MG tablet Take 1 tablet (80 mg total) by mouth daily at 6 PM. 30 tablet 2   citalopram (CELEXA) 20 MG tablet Take 20 mg by mouth daily.     clopidogrel (PLAVIX) 75 MG tablet Take 1 tablet (75 mg total) by mouth daily. 30 tablet 2   hydrochlorothiazide (MICROZIDE) 12.5 MG capsule Take 12.5 mg by mouth daily.     lisinopril (ZESTRIL) 40 MG tablet Take 40 mg by mouth daily.      Melatonin 5 MG TBDP Take 5 mg by mouth at bedtime.      methotrexate (RHEUMATREX) 2.5  MG tablet Take 12.5 mg by mouth every Sunday.   2   No current facility-administered medications on file prior to visit.    Allergies:  No Known Allergies    OBJECTIVE:  Physical Exam  There were no vitals filed for this visit. There is no height or weight on file to calculate BMI. No results found.     07/21/2017   11:04 AM  Depression screen PHQ 2/9  Decreased Interest 3  Down, Depressed, Hopeless 3  PHQ - 2 Score 6  Altered sleeping 0  Tired, decreased energy 3  Change in appetite 0  Feeling bad or failure about yourself  0  Trouble concentrating 0  Moving slowly or fidgety/restless 0  Suicidal thoughts 0  PHQ-9 Score 9  Difficult doing work/chores Not difficult at all     General: well developed, well nourished, seated, in no evident distress Head: head normocephalic and atraumatic.   Neck: supple with no carotid or supraclavicular bruits Cardiovascular: regular rate and rhythm, no murmurs Musculoskeletal: no deformity Skin:  no rash/petichiae Vascular:  Normal pulses all extremities   Neurologic Exam Mental Status: Awake and fully alert. Oriented to place and time. Recent and remote memory intact. Attention span, concentration and fund of knowledge appropriate. Mood and affect appropriate.  Cranial Nerves: Fundoscopic exam reveals sharp disc  margins. Pupils equal, briskly reactive to light. Extraocular movements full without nystagmus. Visual fields full to confrontation. Hearing intact. Facial sensation intact. Face, tongue, palate moves normally and symmetrically.  Motor: Normal bulk and tone. Normal strength in all tested extremity muscles Sensory.: intact to touch , pinprick , position and vibratory sensation.  Coordination: Rapid alternating movements normal in all extremities. Finger-to-nose and heel-to-shin performed accurately bilaterally. Gait and Station: Arises from chair without difficulty. Stance is normal. Gait demonstrates normal stride length and balance with ***. Tandem walk and heel toe ***.  Reflexes: 1+ and symmetric. Toes downgoing.     NIHSS  *** Modified Rankin  ***      ASSESSMENT: WREATHA STURGEON is a 76 y.o. year old female with right pontine and right caudate head infarcts on 02/26/2022, likely cardioembolic source vs large/small vessel disease. Vascular risk factors include history of multiple strokes, HTN, HLD, intra and extracranial stenosis, tobacco and THC use.      PLAN:  Ischemic strokes:  Residual deficit: ***.  Continue aspirin '325mg'$  daily and Plavix for total of 3 months then Plavix alone and atorvastatin (Lipitor) 80 mg daily for secondary stroke prevention.   Discussed secondary stroke prevention measures and importance of close PCP follow up for aggressive stroke risk factor management including BP goal 130-150 given intracranial stenosis, and HLD with LDL goal<70  Stroke labs 02/2022: LDL 116, A1c 5.4 I have gone over the pathophysiology of stroke, warning signs and symptoms, risk factors and their management in some detail with instructions to go to the closest emergency room for symptoms of concern. Tobacco use THC use    Follow up in *** or call earlier if needed   CC:  [facility] provider: Dr. Leonie Man PCP: [place] Pink, MD    I spent *** minutes of face-to-face and  non-face-to-face time with patient.  This included previsit chart review including review of recent hospitalization, lab review, study review, order entry, electronic health record documentation, patient education regarding recent stroke including etiology, secondary stroke prevention measures and importance of managing stroke risk factors, residual deficits and typical recovery time and answered all other questions to patient satisfaction  Frann Rider, AGNP-BC  [facility] [address] [place], [place] [address]  Phone [phone] Fax [phone] Note: This document was prepared with digital dictation and possible smart phrase technology. Any transcriptional errors that result from this process are unintentional.

## 2022-04-13 ENCOUNTER — Encounter: Payer: Self-pay | Admitting: Adult Health

## 2022-04-13 ENCOUNTER — Inpatient Hospital Stay: Payer: Medicare HMO | Admitting: Adult Health

## 2022-04-15 DIAGNOSIS — M6281 Muscle weakness (generalized): Secondary | ICD-10-CM | POA: Diagnosis not present

## 2022-04-15 DIAGNOSIS — R262 Difficulty in walking, not elsewhere classified: Secondary | ICD-10-CM | POA: Diagnosis not present

## 2022-04-19 ENCOUNTER — Institutional Professional Consult (permissible substitution): Payer: Medicare HMO | Admitting: Cardiovascular Disease

## 2022-04-22 DIAGNOSIS — M6281 Muscle weakness (generalized): Secondary | ICD-10-CM | POA: Diagnosis not present

## 2022-04-22 DIAGNOSIS — R262 Difficulty in walking, not elsewhere classified: Secondary | ICD-10-CM | POA: Diagnosis not present

## 2022-04-29 DIAGNOSIS — R262 Difficulty in walking, not elsewhere classified: Secondary | ICD-10-CM | POA: Diagnosis not present

## 2022-04-29 DIAGNOSIS — M6281 Muscle weakness (generalized): Secondary | ICD-10-CM | POA: Diagnosis not present

## 2022-04-30 NOTE — Unmapped (Signed)
[facility] Specialty Medication Onboarding    Specialty Medication: COSENTYX PEN (2 PENS) 150 mg/mL Pnij injection (secukinumab)  Prior Authorization: Approved   Financial Assistance: No - patient must call for financial assistance per MAPs referral  Final Copay/Day Supply: $2,219.06 / 28 (maintenance)    Insurance Restrictions: Yes - max 1 month supply     Notes to Pharmacist: Patient needs to apply for copay assistance, MAPs unable to apply on behalf of patient (text sent).    The triage team has completed the benefits investigation and has determined that the patient is able to fill this medication at [facility]. Please contact the patient to complete the onboarding or follow up with the prescribing physician as needed.

## 2022-05-03 NOTE — Unmapped (Signed)
Specialty Medication(s): Cosentyx    Ms.Jarchow has been dis-enrolled from the [facility] specialty pharmacy services due to  Cost/patient needs to connect with MAPs  .    Additional information provided to the patient: I called patient's number (vm full) and a secondary number (Reached daughter, [name]) and relayed the situation. I provided her with MAP contact information and she'll plan to reach out to apply.     Lanney Gins  [facility] Specialty Pharmacist

## 2022-05-27 ENCOUNTER — Emergency Department: Payer: Medicare HMO

## 2022-05-27 ENCOUNTER — Other Ambulatory Visit: Payer: Self-pay

## 2022-05-27 ENCOUNTER — Observation Stay
Admission: EM | Admit: 2022-05-27 | Discharge: 2022-05-28 | Disposition: A | Discharge status: Home / Self Care | Payer: Medicare HMO | Attending: Emergency Medicine | Admitting: Emergency Medicine

## 2022-05-27 DIAGNOSIS — Z7982 Long term (current) use of aspirin: Secondary | ICD-10-CM | POA: Insufficient documentation

## 2022-05-27 DIAGNOSIS — F17210 Nicotine dependence, cigarettes, uncomplicated: Secondary | ICD-10-CM | POA: Insufficient documentation

## 2022-05-27 DIAGNOSIS — G629 Polyneuropathy, unspecified: Secondary | ICD-10-CM | POA: Diagnosis not present

## 2022-05-27 DIAGNOSIS — R471 Dysarthria and anarthria: Secondary | ICD-10-CM | POA: Diagnosis not present

## 2022-05-27 DIAGNOSIS — N179 Acute kidney failure, unspecified: Secondary | ICD-10-CM | POA: Insufficient documentation

## 2022-05-27 DIAGNOSIS — R29818 Other symptoms and signs involving the nervous system: Secondary | ICD-10-CM | POA: Diagnosis not present

## 2022-05-27 DIAGNOSIS — Z8673 Personal history of transient ischemic attack (TIA), and cerebral infarction without residual deficits: Secondary | ICD-10-CM

## 2022-05-27 DIAGNOSIS — Z96642 Presence of left artificial hip joint: Secondary | ICD-10-CM | POA: Insufficient documentation

## 2022-05-27 DIAGNOSIS — E876 Hypokalemia: Secondary | ICD-10-CM | POA: Diagnosis not present

## 2022-05-27 DIAGNOSIS — D72829 Elevated white blood cell count, unspecified: Secondary | ICD-10-CM | POA: Insufficient documentation

## 2022-05-27 DIAGNOSIS — I10 Essential (primary) hypertension: Secondary | ICD-10-CM

## 2022-05-27 DIAGNOSIS — R262 Difficulty in walking, not elsewhere classified: Secondary | ICD-10-CM | POA: Insufficient documentation

## 2022-05-27 DIAGNOSIS — E785 Hyperlipidemia, unspecified: Secondary | ICD-10-CM

## 2022-05-27 DIAGNOSIS — Z1152 Encounter for screening for COVID-19: Secondary | ICD-10-CM | POA: Diagnosis not present

## 2022-05-27 DIAGNOSIS — Z79899 Other long term (current) drug therapy: Secondary | ICD-10-CM | POA: Diagnosis not present

## 2022-05-27 DIAGNOSIS — Z7902 Long term (current) use of antithrombotics/antiplatelets: Secondary | ICD-10-CM | POA: Diagnosis not present

## 2022-05-27 DIAGNOSIS — F32A Depression, unspecified: Secondary | ICD-10-CM | POA: Insufficient documentation

## 2022-05-27 DIAGNOSIS — R4789 Other speech disturbances: Secondary | ICD-10-CM | POA: Diagnosis present

## 2022-05-27 DIAGNOSIS — I959 Hypotension, unspecified: Secondary | ICD-10-CM | POA: Diagnosis not present

## 2022-05-27 DIAGNOSIS — R0602 Shortness of breath: Secondary | ICD-10-CM | POA: Diagnosis not present

## 2022-05-27 LAB — CBC
HCT: 41.8 % (ref 36.0–46.0)
Hemoglobin: 14.6 g/dL (ref 12.0–15.0)
MCH: 33 pg (ref 26.0–34.0)
MCHC: 34.9 g/dL (ref 30.0–36.0)
MCV: 94.4 fL (ref 80.0–100.0)
Platelets: 230 10*3/uL (ref 150–400)
RBC: 4.43 MIL/uL (ref 3.87–5.11)
RDW: 12 % (ref 11.5–15.5)
WBC: 14.3 10*3/uL — ABNORMAL HIGH (ref 4.0–10.5)
nRBC: 0 % (ref 0.0–0.2)

## 2022-05-27 LAB — APTT: aPTT: 29 seconds (ref 24–36)

## 2022-05-27 LAB — COMPREHENSIVE METABOLIC PANEL
ALT: 18 U/L (ref 0–44)
AST: 25 U/L (ref 15–41)
Albumin: 3.4 g/dL — ABNORMAL LOW (ref 3.5–5.0)
Alkaline Phosphatase: 65 U/L (ref 38–126)
Anion gap: 12 (ref 5–15)
BUN: 23 mg/dL (ref 8–23)
CO2: 28 mmol/L (ref 22–32)
Calcium: 9.1 mg/dL (ref 8.9–10.3)
Chloride: 96 mmol/L — ABNORMAL LOW (ref 98–111)
Creatinine, Ser: 1.34 mg/dL — ABNORMAL HIGH (ref 0.44–1.00)
GFR, Estimated: 41 mL/min — ABNORMAL LOW (ref >60)
Glucose, Bld: 180 mg/dL — ABNORMAL HIGH (ref 70–99)
Potassium: 2.9 mmol/L — ABNORMAL LOW (ref 3.5–5.1)
Sodium: 136 mmol/L (ref 135–145)
Total Bilirubin: 0.9 mg/dL (ref 0.3–1.2)
Total Protein: 6.2 g/dL — ABNORMAL LOW (ref 6.5–8.1)

## 2022-05-27 LAB — DIFFERENTIAL
Abs Immature Granulocytes: 0.05 10*3/uL (ref 0.00–0.07)
Basophils Absolute: 0.1 10*3/uL (ref 0.0–0.1)
Basophils Relative: 0 %
Eosinophils Absolute: 0.1 10*3/uL (ref 0.0–0.5)
Eosinophils Relative: 1 %
Immature Granulocytes: 0 %
Lymphocytes Relative: 14 %
Lymphs Abs: 1.9 10*3/uL (ref 0.7–4.0)
Monocytes Absolute: 1 10*3/uL (ref 0.1–1.0)
Monocytes Relative: 7 %
Neutro Abs: 11.1 10*3/uL — ABNORMAL HIGH (ref 1.7–7.7)
Neutrophils Relative %: 78 %

## 2022-05-27 LAB — ETHANOL: Alcohol, Ethyl (B): <10 mg/dL (ref <10)

## 2022-05-27 LAB — PROTIME-INR
INR: 1.1 (ref 0.8–1.2)
Prothrombin Time: 14.1 seconds (ref 11.4–15.2)

## 2022-05-27 LAB — TROPONIN I (HIGH SENSITIVITY): Troponin I (High Sensitivity): 26 ng/L — ABNORMAL HIGH (ref <18)

## 2022-05-27 MED ORDER — POTASSIUM CHLORIDE CRYS ER 20 MEQ PO TBCR
40.0000 meq | EXTENDED_RELEASE_TABLET | Freq: Once | ORAL | Status: DC
  Filled 2022-05-27: qty 2

## 2022-05-27 MED ORDER — LACTATED RINGERS IV BOLUS
1000.0000 mL | Freq: Once | INTRAVENOUS | Status: AC
  Administered 2022-05-27: 1000 mL via INTRAVENOUS

## 2022-05-27 MED ORDER — POTASSIUM CHLORIDE 10 MEQ/100ML IV SOLN
10.0000 meq | INTRAVENOUS | Status: AC
  Administered 2022-05-27 – 2022-05-28 (×2): 10 meq via INTRAVENOUS
  Filled 2022-05-27 (×2): qty 100

## 2022-05-27 NOTE — ED Triage Notes (Addendum)
Pt and family report acute onset expressive aphasia 2030 tonight. Family reports hx of stroke and TIA. Pt has residual L weakness and droop from prior events. Family states droop and weakness worse tonight with pt unable to walk independently which is different from baseline. Pt noted to be drooling and having difficulty maintaining secretions. Pt also noted to be having difficulty answering questions and is slow to follow commands. Pt denies h/a. Pupils equal and reactive. Weakness noted to L leg when standing. Denies parasthesia. Code stroke activated. Charge RN and MD made aware. Pt transported immediately to CT/

## 2022-05-27 NOTE — Progress Notes (Signed)
Telestroke RN note:  2222-Code stroke activated by caregility cart, pt returning from CT, TS paged at this time  2243-Dr. Elvin So joined cart for assessment, notified of noncontrast head CT results

## 2022-05-27 NOTE — ED Provider Notes (Signed)
[facility] Provider Note    Event Date/Time   First MD Initiated Contact with Patient 05/27/22 2249     (approximate)   History   Code Stroke   HPI  Victoria Gutierrez is a 76 y.o. female past medical history of hypertension hyperlipidemia prior CVA who presents with increased difficulty walking and speech difficulty as well as drooling.  Patient is accompanied by her sister.  Tells me that tonight around 8 PM she started having more difficulty ambulating and required assistance to get to the bathroom which is not typical for her.  I also noticed her to be drooling on the left side of her mouth and apparently she had a choking episode due to the drooling.  Her sister who is at bedside thinks that the symptoms have significantly improved.  Patient currently denies much in the way of new symptoms she denies any recent illnesses fevers chills cough.  Denies chest pain shortness of breath.  Denies urinary symptoms.  In talking to the patient's daughter patient had complained of some headache earlier in the day but was at her baseline.  Tonight around 7 PM was sitting at the dinner table when she slumped over.  Her daughter went to get her up and she was not able to walk and had some drooling and slurred speech that seem to be coming and going.  She had some shortness of breath at that time as well.  At baseline she does have some difficulty ambulating from her recent stroke but has worked with physical therapy and is now walking without assistance.  Her speech generally is normal at baseline.  Patient was admitted back in September 2023 with left-sided facial droop and slurred speech found to have acute/subacute right caudate and lentiform nucleus and right internal capsule.  Patient started on aspirin and Plavix.     Past Medical History:  Diagnosis Date   Arthritis    knees, hands   H/O thyroid disease    No issues currently   HOH (hard of hearing)    Hypertension     Stroke ([place]) 12/2018   Wears hearing aid in both ears    has, does not wear    Patient Active Problem List   Diagnosis Date Noted   History of ischemic stroke 02/26/2022   Acute/subacute CVA 02/26/2022   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  02/26/2022   Tobacco abuse 02/26/2022   Displaced fracture of left femoral neck ([place]) 04/05/2019   Carotid stenosis 02/20/2019   Ischemic stroke ([place]) 01/20/2019   Localized osteoarthritis of hands, bilateral 09/23/2016   Multiple joint pain 08/25/2016   Psoriasis, unspecified 08/25/2016   Cerebral vascular disease 04/16/2014   Hyperlipidemia 04/16/2014   Hypertension 04/16/2014   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 --      Excl. in [place]? --     Most recent vital signs: Vitals:   05/27/22 2230 05/27/22 2300  BP: 114/67 107/75  Pulse: 79 73  Resp: (!) 23 20  Temp:    SpO2: 95% 96%     General: Awake, no distress.  CV:  Good peripheral perfusion.  Resp:  Normal effort.  Abd:  No distention.  Neuro:             Awake, Alert, Oriented x 3  Other:  Patient's speech is slowed and mildly dysarthric but no aphasia Aox3, nml speech  PERRL, EOMI, nml tongue movement  + Subtle left-sided facial droop 5/5 strength in the BL upper and lower extremities  Sensation grossly intact in the BL upper and lower extremities  Finger-nose-finger intact BL    ED Results / Procedures / Treatments  Labs (all labs ordered are listed, but only abnormal results are  displayed) Labs Reviewed  CBC - Abnormal; Notable for the following components:      Result Value   WBC 14.3 (*)    All other components within normal limits  DIFFERENTIAL - Abnormal; Notable for the following components:   Neutro Abs 11.1 (*)    All other components within normal limits  COMPREHENSIVE METABOLIC PANEL - Abnormal; Notable for the following components:   Potassium 2.9 (*)    Chloride 96 (*)    Glucose, Bld 180 (*)    Creatinine, Ser 1.34 (*)    Total Protein 6.2 (*)    Albumin 3.4 (*)    GFR, Estimated 41 (*)    All other components within normal limits  TROPONIN I (HIGH SENSITIVITY) - Abnormal; Notable for the following components:   Troponin I (High Sensitivity) 26 (*)    All other components within normal limits  SARS CORONAVIRUS 2 BY RT PCR  ETHANOL  PROTIME-INR  APTT  URINE DRUG SCREEN, QUALITATIVE ([facility] ONLY)  URINALYSIS, ROUTINE W REFLEX MICROSCOPIC     EKG  EKG reviewed interpreted myself shows normal sinus rhythm with right bundle branch block inferior Q waves no acute ischemic changes, unchanged from prior EKG   RADIOLOGY Reviewed interpreted the CT head which is negative for acute abnormality or bleed   PROCEDURES:  Critical Care performed: Yes, see critical care procedure note(s)  Procedures  The patient is on the cardiac monitor to evaluate for evidence of arrhythmia and/or significant heart rate changes.   MEDICATIONS ORDERED IN ED: Medications  potassium chloride 10 mEq in 100 mL IVPB (has no administration in time range)  lactated ringers bolus 1,000 mL (1,000 mLs Intravenous New Bag/Given 05/27/22 2333)     IMPRESSION / MDM / ASSESSMENT AND PLAN / ED COURSE  I reviewed the triage vital signs and the nursing notes.                              Patient's presentation is most consistent with acute presentation with potential threat to life or bodily function.  Differential diagnosis includes, but is not limited to, CVA, TIA,  recrudescence, intracranial hemorrhage, metabolic encephalopathy, infection such as UTI, pneumonia, viral illness  The patient is a 76 year old female with recent basal ganglia and caudate stroke resulting in left-sided deficits and dysarthria back in September presents because of increasing difficulty ambulating dysarthria and drooling.  Per her daughter the symptoms started around 75  PM.  Has been complaining of headache and had been slightly slower to respond earlier in the day.  Stroke alert was called from triage.  Patient was seen by teleneurology NIH stroke scale 2.  CT head without any acute findings redemonstrates old stroke from September.  On my evaluation patient is slow to respond and has some slight dysarthria and a subtle left-sided facial droop but her strength is normal she is oriented x 3 and alert.  Her vitals are reassuring.  She is not a tPA candidate because of her recent stroke.  Low suspicion for LVO based on her exam.  Discussed with teleneurology they would like to admit for further CVA workup with MRI and also workup for encephalopathy.  Patient's labs are notable for leukocytosis to 14 potassium 2.9.  Troponin mildly increased at 26 EKG without acute ischemic changes and patient not having any ischemic symptoms.  Will obtain a urine and a chest x-ray to rule out infectious cause of potential recrudescence.  I think differential includes TIA versus recrudescence.  Patient notably failed swallow screen in the ED apparently when she was given water she stopped drinking and then the water poured out of her mouth.  Will give IV potassium.  Will admit to the hospitalist service.     FINAL CLINICAL IMPRESSION(S) / ED DIAGNOSES   Final diagnoses:  Dysarthria     Rx / DC Orders   ED Discharge Orders     None        Note:  This document was prepared using Dragon voice recognition software and may include unintentional dictation errors.   Rada Hay, MD 05/27/22  [phone]

## 2022-05-27 NOTE — ED Notes (Signed)
Dr. Starleen Blue at bedside.

## 2022-05-27 NOTE — ED Notes (Signed)
ED Provider at bedside. 

## 2022-05-27 NOTE — ED Notes (Signed)
Pt brought to ED rm 16 at this time, MD assessed the pt and is calling the stroke alert. Pt going to CT at this time.

## 2022-05-27 NOTE — ED Triage Notes (Signed)
EMS brings pt in from home; hx CVA; facial droop and drool at baseline, called out by family for increased symptoms

## 2022-05-27 NOTE — ED Notes (Signed)
Back from CT. Tele monitor activated and RN on screen is talking with pt.

## 2022-05-27 NOTE — Consult Note (Signed)
[facility] Tele[facility]urology Consult Services   Patient Name:   Victoria Gutierrez, Victoria Gutierrez Date of Birth:   1946-03-12 Identification Number:   MRN - [id number] Date of Service:   05/27/2022 22:22:56  Diagnosis:       I63.9 - Cerebrovascular accident (CVA), unspecified mechanism [facility])  Impression:      76 year old female with history of hypertension, tobacco abuse, hyperlipidemia, prior history of stroke who also suffered recent CVAs involving the right basal ganglia and right pons in September 2023. 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 Patient was recommended for dual antiplatelet therapy with aspirin Plavix for 3 months. She does have some residual left-sided deficit and some slurred speech but apparently was able to ambulate without assistance. This evening, she was noted to have some increased slurred speech and possible aphasia as well as possible left worsened facial droop and sense of weakness. Symptoms seem to fluctuate.Currently she is doing somewhat better.  On examination, she has very mild dysarthria and subtle left facial asymmetry, otherwise no definite localized limb weakness, sensory loss or neglect. NIHSS = 2. CT head reveals patient's known recent right caudate head infarct but no acute abnormalities nor hemorrhage. The previously noted pontine infarct not well-visualized on this CT.  Admit for cerebrovascular workup and management, rule out new CVA versus recrudescence of old stroke deficits from other medical illness.  Suggest cerebrovascular and encephalopathy workup.  Our recommendations are outlined below.  Recommendations:        Stroke/Telemetry Floor       Neuro Checks       Bedside Swallow Eval       DVT Prophylaxis       IV Fluids, Normal Saline       Head of Bed 30 Degrees       Euglycemia and  Avoid Hyperthermia (PRN Acetaminophen)       Antihypertensives PRN if Blood pressure is greater than 220/120 or there is a concern for End organ damage/contraindications for permissive HTN. If blood pressure is greater than 220/120 give labetalol PO or IV or Vasotec IV with a goal of 15% reduction in BP during the first 24 hours.       Continue current aspirin and Plavix  Sign Out:       Discussed with Emergency Department Provider    ------------------------------------------------------------------------------  Metrics: Last Known Well: 05/27/2022 20:30:00 [facility] Notification Time: 05/27/2022 22:22:56 Arrival Time: 05/27/2022 21:39:00 Stamp Time: 05/27/2022 22:22:56 Initial Response Time: 05/27/2022 22:30:35 Symptoms: Increased difficulty speaking, weakness, left facial droop more pronounced. Initial patient interaction: 05/27/2022 22:46:23 NIHSS Assessment Completed: 05/27/2022 22:55:24 Patient is not a candidate for Thrombolytic. Thrombolytic Medical Decision: 05/27/2022 22:55:26 Patient was not deemed candidate for Thrombolytic because of following reasons: Stroke in last 3 months.  CT head showed no acute hemorrhage or acute core infarct. I personally Reviewed the CT Head and it Showed no acute bleed or acute infarct  Primary Provider Notified of Diagnostic Impression and Management Plan on: 05/27/2022 23:09:09    ------------------------------------------------------------------------------  History of Present Illness: Patient is a 76 year old Female.  Patient was brought by EMS for symptoms of Increased difficulty speaking, weakness, left facial droop more pronounced. 75 year old female with history of hypertension, tobacco abuse, hyperlipidemia, prior history of stroke who also suffered recent CVAs involving the right basal ganglia and right pons in September 2023. Workup revealed she has suspected chronic  left M1 occlusion, significant intracranial vascular  disease with bilateral intracranial ICA stenosis around 50% and right M2/M3 and right ACA severe stenosis. Patient was recommended for dual antiplatelet therapy with aspirin Plavix for 3 months. She does have some residual left-sided deficit and some slurred speech but apparently was able to ambulate without assistance. This evening, she was noted to have some increased slurred speech and possible aphasia as well as possible left worsened facial droop and sense of weakness. Symptoms seem to fluctuate.Currently she is doing somewhat better.     Past Medical History:      Hypertension      Hyperlipidemia      Stroke [place] PMH:  History of thyroid disease  DJD  Hearing impairment    Medications:  No Anticoagulant use  Antiplatelet use: Yes ASA 81 mg/ Plavix 75 mg Reviewed EMR for current medications Other Medications Pertinent To Assessment Include: Amlodipine, atorvastatin, citalopram, hydrochlorothiazide, lisinopril, melatonin, methotrexate  Allergies:  Reviewed,NKDA  Social History: Smoking: Yes Alcohol Use: No Drug Use: No  Family History:  There Is Family History Of:CAD, breast cancer There is no family history of premature cerebrovascular disease pertinent to this consultation  ROS : 14 Points Review of Systems was performed and was negative except mentioned in HPI.  Past Surgical History: There Is No Surgical History Contributory To Today's Visit There Is Surgical History of:  Hysterectomy  Bilateral cataracts  Left total hip replacement     Examination: BP(131/74), Pulse(78), Blood Glucose(180) 1A: Level of Consciousness - Alert; keenly responsive + 0 1B: Ask Month and Age - Both Questions Right + 0 1C: Blink Eyes & Squeeze Hands - Performs Both Tasks + 0 2: Test Horizontal Extraocular Movements - Normal + 0 3: Test Visual Fields - No Visual Loss + 0 4: Test Facial Palsy (Use Grimace if Obtunded) - Minor paralysis (flat nasolabial fold, smile asymmetry) +  1 5A: Test Left Arm Motor Drift - No Drift for 10 Seconds + 0 5B: Test Right Arm Motor Drift - No Drift for 10 Seconds + 0 6A: Test Left Leg Motor Drift - No Drift for 5 Seconds + 0 6B: Test Right Leg Motor Drift - No Drift for 5 Seconds + 0 7: Test Limb Ataxia (FNF/Heel-Shin) - No Ataxia + 0 8: Test Sensation - Normal; No sensory loss + 0 9: Test Language/Aphasia - Normal; No aphasia + 0 10: Test Dysarthria - Mild-Moderate Dysarthria: Slurring but can be understood + 1 11: Test Extinction/Inattention - No abnormality + 0  NIHSS Score: 2   Pre-Morbid Modified Rankin Scale: 2 Points = Slight disability; unable to carry out all previous activities, but able to look after own affairs without assistance  Spoke with : Dr Starleen Blue   Patient/Family was informed the Neurology Consult would occur via TeleHealth consult by way of interactive audio and video telecommunications and consented to receiving care in this manner.   Patient is being evaluated for possible acute neurologic impairment and high probability of imminent or life-threatening deterioration. I spent total of 35 minutes providing care to this patient, including time for face to face visit via telemedicine, review of medical records, imaging studies and discussion of findings with providers, the patient and/or family.   Dr Daria Pastures   [facility] For Inpatient follow-up with [facility] physician please call [facility] [phone]. This is not an outpatient service. Post hospital discharge, please contact hospital directly.

## 2022-05-27 NOTE — ED Notes (Signed)
MD notified of failed swallow screen

## 2022-05-27 NOTE — ED Notes (Signed)
Neurologist is on the camera and assessing pt. Dr. Elvin So

## 2022-05-28 ENCOUNTER — Observation Stay: Payer: Medicare HMO

## 2022-05-28 DIAGNOSIS — E785 Hyperlipidemia, unspecified: Secondary | ICD-10-CM | POA: Diagnosis not present

## 2022-05-28 DIAGNOSIS — E876 Hypokalemia: Secondary | ICD-10-CM

## 2022-05-28 DIAGNOSIS — I10 Essential (primary) hypertension: Secondary | ICD-10-CM | POA: Diagnosis not present

## 2022-05-28 DIAGNOSIS — R471 Dysarthria and anarthria: Secondary | ICD-10-CM | POA: Diagnosis present

## 2022-05-28 DIAGNOSIS — R29818 Other symptoms and signs involving the nervous system: Secondary | ICD-10-CM | POA: Diagnosis not present

## 2022-05-28 DIAGNOSIS — Z8673 Personal history of transient ischemic attack (TIA), and cerebral infarction without residual deficits: Secondary | ICD-10-CM | POA: Diagnosis not present

## 2022-05-28 DIAGNOSIS — F32A Depression, unspecified: Secondary | ICD-10-CM | POA: Insufficient documentation

## 2022-05-28 DIAGNOSIS — G629 Polyneuropathy, unspecified: Secondary | ICD-10-CM

## 2022-05-28 LAB — URINALYSIS, ROUTINE W REFLEX MICROSCOPIC
Bilirubin Urine: NEGATIVE
Glucose, UA: NEGATIVE mg/dL
Hgb urine dipstick: NEGATIVE
Ketones, ur: NEGATIVE mg/dL
Leukocytes,Ua: NEGATIVE
Nitrite: NEGATIVE
Protein, ur: 100 mg/dL — AB
Specific Gravity, Urine: 1.018 (ref 1.005–1.030)
pH: 5 (ref 5.0–8.0)

## 2022-05-28 LAB — SARS CORONAVIRUS 2 BY RT PCR: SARS Coronavirus 2 by RT PCR: NEGATIVE

## 2022-05-28 LAB — CBC
HCT: 39.1 % (ref 36.0–46.0)
Hemoglobin: 13.1 g/dL (ref 12.0–15.0)
MCH: 32.3 pg (ref 26.0–34.0)
MCHC: 33.5 g/dL (ref 30.0–36.0)
MCV: 96.5 fL (ref 80.0–100.0)
Platelets: 198 10*3/uL (ref 150–400)
RBC: 4.05 MIL/uL (ref 3.87–5.11)
RDW: 12.1 % (ref 11.5–15.5)
WBC: 9.6 10*3/uL (ref 4.0–10.5)
nRBC: 0 % (ref 0.0–0.2)

## 2022-05-28 LAB — BASIC METABOLIC PANEL
Anion gap: 6 (ref 5–15)
BUN: 18 mg/dL (ref 8–23)
CO2: 27 mmol/L (ref 22–32)
Calcium: 8.2 mg/dL — ABNORMAL LOW (ref 8.9–10.3)
Chloride: 105 mmol/L (ref 98–111)
Creatinine, Ser: 0.98 mg/dL (ref 0.44–1.00)
GFR, Estimated: 60 mL/min — ABNORMAL LOW (ref >60)
Glucose, Bld: 101 mg/dL — ABNORMAL HIGH (ref 70–99)
Potassium: 3.2 mmol/L — ABNORMAL LOW (ref 3.5–5.1)
Sodium: 138 mmol/L (ref 135–145)

## 2022-05-28 LAB — URINE DRUG SCREEN, QUALITATIVE (ARMC ONLY)
Amphetamines, Ur Screen: NOT DETECTED
Barbiturates, Ur Screen: NOT DETECTED
Benzodiazepine, Ur Scrn: NOT DETECTED
Cannabinoid 50 Ng, Ur ~~LOC~~: NOT DETECTED
Cocaine Metabolite,Ur ~~LOC~~: NOT DETECTED
MDMA (Ecstasy)Ur Screen: NOT DETECTED
Methadone Scn, Ur: NOT DETECTED
Opiate, Ur Screen: NOT DETECTED
Phencyclidine (PCP) Ur S: NOT DETECTED
Tricyclic, Ur Screen: NOT DETECTED

## 2022-05-28 LAB — CBG MONITORING, ED: Glucose-Capillary: 216 mg/dL — ABNORMAL HIGH (ref 70–99)

## 2022-05-28 LAB — LIPID PANEL
Cholesterol: 147 mg/dL (ref 0–200)
HDL: 45 mg/dL (ref >40)
LDL Cholesterol: 94 mg/dL (ref 0–99)
Total CHOL/HDL Ratio: 3.3 RATIO
Triglycerides: 42 mg/dL (ref <150)
VLDL: 8 mg/dL (ref 0–40)

## 2022-05-28 LAB — MAGNESIUM: Magnesium: 2.1 mg/dL (ref 1.7–2.4)

## 2022-05-28 MED ORDER — MAGNESIUM HYDROXIDE 400 MG/5ML PO SUSP: 30.0000 mL | Freq: Every day | ORAL | Status: DC | PRN

## 2022-05-28 MED ORDER — LISINOPRIL 10 MG PO TABS
20.0000 mg | ORAL_TABLET | ORAL | Status: AC
  Administered 2022-05-28: 20 mg via ORAL
  Filled 2022-05-28: qty 2

## 2022-05-28 MED ORDER — POTASSIUM CHLORIDE 10 MEQ/100ML IV SOLN: 10.0000 meq | INTRAVENOUS | Status: DC

## 2022-05-28 MED ORDER — AMLODIPINE BESYLATE 5 MG PO TABS
10.0000 mg | ORAL_TABLET | Freq: Every day | ORAL | Status: DC
  Administered 2022-05-28: 10 mg via ORAL
  Filled 2022-05-28: qty 2

## 2022-05-28 MED ORDER — ONDANSETRON HCL 4 MG/2ML IJ SOLN: 4.0000 mg | Freq: Four times a day (QID) | INTRAMUSCULAR | Status: DC | PRN

## 2022-05-28 MED ORDER — ACETAMINOPHEN 325 MG PO TABS: 650.0000 mg | ORAL_TABLET | Freq: Four times a day (QID) | ORAL | Status: DC | PRN

## 2022-05-28 MED ORDER — ASPIRIN 81 MG PO TBEC
81.0000 mg | DELAYED_RELEASE_TABLET | Freq: Every day | ORAL | Status: DC
  Administered 2022-05-28: 81 mg via ORAL
  Filled 2022-05-28: qty 1

## 2022-05-28 MED ORDER — STROKE: EARLY STAGES OF RECOVERY BOOK: Freq: Once | Status: DC

## 2022-05-28 MED ORDER — ACETAMINOPHEN 650 MG RE SUPP: 650.0000 mg | Freq: Four times a day (QID) | RECTAL | Status: DC | PRN

## 2022-05-28 MED ORDER — LISINOPRIL 10 MG PO TABS: 20.0000 mg | ORAL_TABLET | Freq: Every day | ORAL | Status: DC

## 2022-05-28 MED ORDER — POTASSIUM CHLORIDE 10 MEQ/100ML IV SOLN
10.0000 meq | INTRAVENOUS | Status: AC
  Administered 2022-05-28 (×2): 10 meq via INTRAVENOUS
  Filled 2022-05-28 (×2): qty 100

## 2022-05-28 MED ORDER — ENOXAPARIN SODIUM 40 MG/0.4ML IJ SOSY
40.0000 mg | PREFILLED_SYRINGE | INTRAMUSCULAR | Status: DC
  Administered 2022-05-28: 40 mg via SUBCUTANEOUS
  Filled 2022-05-28: qty 0.4

## 2022-05-28 MED ORDER — POTASSIUM CHLORIDE 20 MEQ PO PACK: 40.0000 meq | PACK | Freq: Once | ORAL | Status: DC

## 2022-05-28 MED ORDER — POTASSIUM CHLORIDE CRYS ER 10 MEQ PO TBCR: 10.0000 meq | EXTENDED_RELEASE_TABLET | Freq: Two times a day (BID) | ORAL | 0 refills | Status: DC

## 2022-05-28 MED ORDER — SODIUM CHLORIDE 0.9 % IV SOLN: INTRAVENOUS | Status: DC

## 2022-05-28 MED ORDER — ONDANSETRON HCL 4 MG PO TABS: 4.0000 mg | ORAL_TABLET | Freq: Four times a day (QID) | ORAL | Status: DC | PRN

## 2022-05-28 MED ORDER — TRAZODONE HCL 50 MG PO TABS: 25.0000 mg | ORAL_TABLET | Freq: Every evening | ORAL | Status: DC | PRN

## 2022-05-28 MED ORDER — CLOPIDOGREL BISULFATE 75 MG PO TABS
75.0000 mg | ORAL_TABLET | Freq: Every day | ORAL | Status: DC
  Administered 2022-05-28: 75 mg via ORAL
  Filled 2022-05-28: qty 1

## 2022-05-28 MED ORDER — LISINOPRIL 10 MG PO TABS: 40.0000 mg | ORAL_TABLET | Freq: Every day | ORAL | Status: DC

## 2022-05-28 MED ORDER — POTASSIUM CHLORIDE 10 MEQ/100ML IV SOLN
10.0000 meq | INTRAVENOUS | Status: AC
  Administered 2022-05-28 (×3): 10 meq via INTRAVENOUS
  Filled 2022-05-28 (×3): qty 100

## 2022-05-28 MED ORDER — ATORVASTATIN CALCIUM 20 MG PO TABS: 80.0000 mg | ORAL_TABLET | Freq: Every day | ORAL | Status: DC

## 2022-05-28 NOTE — Assessment & Plan Note (Signed)
-   We will continue statin therapy. 

## 2022-05-28 NOTE — Assessment & Plan Note (Signed)
-   We will continue antihypertensives with permissive hypertension parameters.

## 2022-05-28 NOTE — ED Notes (Signed)
Attending Zhang D. notified via secure chat of pt's BP 190/101.

## 2022-05-28 NOTE — ED Notes (Signed)
Informed RN bed assigned 

## 2022-05-28 NOTE — Progress Notes (Signed)
   05/28/22 0800  Clinical Encounter Type  Visited With Patient  Visit Type Initial  Referral From Nurse  Consult/Referral To Chaplain   Chaplain responded to code stemi. Patient transporting to CT. Chaplain services are available for follow up as needed.

## 2022-05-28 NOTE — H&P (Signed)
[place]   PATIENT NAME: Victoria Gutierrez    MR#:  [id number]  DATE OF BIRTH:  19-Apr-1946  DATE OF ADMISSION:  05/27/2022  PRIMARY CARE PHYSICIAN: Maryland Pink, MD   Patient is coming from: Home  REQUESTING/REFERRING PHYSICIAN: Rada Hay, MD  CHIEF COMPLAINT:   Chief Complaint  Patient presents with   Code Stroke    HISTORY OF PRESENT ILLNESS:  Victoria Gutierrez is a 76 y.o. Caucasian female with medical history significant for osteoarthritis, hearing loss, hypertension and CVA, who presented to the emergency room with acute onset of dysarthria starting around 5 PM.  The patient was noted to be slumped over.  She was having difficulty with ambulation.  She was also drooling.  Family.  She has been noted to have dyspnea with associated wheezing that currently resolved.  No paresthesias or focal muscle weakness.  No headache or dizziness or blurred vision.  No chest pain or palpitations.  No nausea or vomiting or abdominal pain.  No bleeding diathesis.  ED Course: When the patient came to the ER, vital signs were within normal.  Labs revealed hypokalemia of 2.9 with hypochloremia 96, creatinine 1.34 and albumin 3.4 with total protein 6.2.  High sensitive troponin was 26.  LFTs showed leukocytosis of 14.3. EKG as reviewed by me : EKG showed normal sinus rhythm with a rate of 80 with right bundle branch block and old inferior Q waves. Imaging: Portable chest x-ray showed cardiomegaly with no acute cardiopulmonary disease.  Head and neck CTA revealed the following: 1. 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 2. 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 3. Atherosclerotic changes of the proximal internal carotid arteries bilaterally without significant stenosis relative to the more distal vessels. 4. 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 5. Tandem stenosis of the right vertebral artery at the C6  level. 6. Dominant right thyroid nodule. This was noted on previous nuclear medicine study. Consider further evaluation with thyroid ultrasound. If patient is clinically hyperthyroid, consider nuclear medicine thyroid uptake and scan. 7. Atherosclerotic changes within the cavernous internal carotid arteries bilaterally without a significant stenosis. 8. Collateral flow to left MCA branches. 9. Hypoplastic basilar artery with distal narrowing. Fetal type posterior cerebral arteries.  Patient was given 1 L bolus of IV lactated Ringer: 10 mill equivalent IV potassium chloride.  She will be admitted to a medical telemetry observation bed for further evaluation and management. PAST MEDICAL HISTORY:   Past Medical History:  Diagnosis Date   Arthritis    knees, hands   H/O thyroid disease    No issues currently   HOH (hard of hearing)    Hypertension    Stroke ([place]) 12/2018   Wears hearing aid in both ears    has, does not wear    PAST SURGICAL HISTORY:   Past Surgical History:  Procedure Laterality Date   ABDOMINAL HYSTERECTOMY     CATARACT EXTRACTION W/PHACO Right 09/18/2019   Procedure: CATARACT EXTRACTION PHACO AND INTRAOCULAR LENS PLACEMENT (IOC) RIGHT 11.65  01:03.3;  Surgeon: Birder Robson, MD;  Location: [place];  Service: Ophthalmology;  Laterality: Right;   CATARACT EXTRACTION W/PHACO Left 10/09/2019   Procedure: CATARACT EXTRACTION PHACO AND INTRAOCULAR LENS PLACEMENT (IOC) LEFT 10.30  00:48.5;  Surgeon: Birder Robson, MD;  Location: [place];  Service: Ophthalmology;  Laterality: Left;   TOTAL HIP ARTHROPLASTY Left 04/05/2019   Procedure: TOTAL HIP  ARTHROPLASTY ANTERIOR APPROACH;  Surgeon: Hessie Knows, MD;  Location: [facility];  Service: Orthopedics;  Laterality: Left;    SOCIAL HISTORY:   Social History   Tobacco Use   Smoking status: Every Day    Packs/day: 0.50    Years: 38.00    Total pack years: 19.00    Types: Cigarettes     Start date: 03/10/1987   Smokeless tobacco: Never   Tobacco comments:    since age 76  Substance Use Topics   Alcohol use: No    FAMILY HISTORY:   Family History  Problem Relation Age of Onset   Heart disease Mother    Heart disease Father    Breast cancer Neg Hx     DRUG ALLERGIES:  No Known Allergies  REVIEW OF SYSTEMS:   ROS As per history of present illness. All pertinent systems were reviewed above. Constitutional, HEENT, cardiovascular, respiratory, GI, GU, musculoskeletal, neuro, psychiatric, endocrine, integumentary and hematologic systems were reviewed and are otherwise negative/unremarkable except for positive findings mentioned above in the HPI.   MEDICATIONS AT HOME:   Prior to Admission medications   Medication Sig Start Date End Date Taking? Authorizing Provider  amLODipine (NORVASC) 10 MG tablet Take 1 tablet (10 mg total) by mouth daily. 03/01/22  Yes Cherene Altes, MD  aspirin EC 325 MG tablet Take 1 tablet (325 mg total) by mouth daily. 03/01/22 05/30/22 Yes Cherene Altes, MD  atorvastatin (LIPITOR) 80 MG tablet Take 1 tablet (80 mg total) by mouth daily at 6 PM. 02/28/22  Yes Cherene Altes, MD  clopidogrel (PLAVIX) 75 MG tablet Take 1 tablet (75 mg total) by mouth daily. 03/01/22  Yes Cherene Altes, MD  citalopram (CELEXA) 20 MG tablet Take 20 mg by mouth daily. Patient not taking: Reported on 05/27/2022    [provider]  hydrochlorothiazide (MICROZIDE) 12.5 MG capsule Take 12.5 mg by mouth daily. Patient not taking: Reported on 05/27/2022    [provider]  lisinopril (ZESTRIL) 40 MG tablet Take 40 mg by mouth daily.  Patient not taking: Reported on 05/27/2022    [provider]  Melatonin 5 MG TBDP Take 5 mg by mouth at bedtime.  Patient not taking: Reported on 05/27/2022 05/12/16   [provider]  methotrexate (RHEUMATREX) 2.5 MG tablet Take 12.5 mg by mouth every Sunday.  Patient not taking: Reported  on 05/27/2022 02/22/18   [provider]      VITAL SIGNS:  Blood pressure (!) 145/84, pulse 84, temperature 98.3 F (36.8 C), resp. rate 18, height '5\' 7"'$  (1.702 m), weight 68 kg, SpO2 97 %.  PHYSICAL EXAMINATION:  Physical Exam  GENERAL:  76 y.o.-year-old Caucasian female patient lying in the bed with no acute distress.  EYES: Pupils equal, round, reactive to light and accommodation. No scleral icterus. Extraocular muscles intact.  HEENT: Head atraumatic, normocephalic. Oropharynx and nasopharynx clear.  NECK:  Supple, no jugular venous distention. No thyroid enlargement, no tenderness.  LUNGS: Normal breath sounds bilaterally, no wheezing, rales,rhonchi or crepitation. No use of accessory muscles of respiration.  CARDIOVASCULAR: Regular rate and rhythm, S1, S2 normal. No murmurs, rubs, or gallops.  ABDOMEN: Soft, nondistended, nontender. Bowel sounds present. No organomegaly or mass.  EXTREMITIES: No pedal edema, cyanosis, or clubbing.  NEUROLOGIC: Cranial nerves II through XII are intact. Muscle strength 5/5 in all extremities. Sensation intact. Gait not checked.  PSYCHIATRIC: The patient is alert and oriented x 3.  Normal affect and good eye  contact. SKIN: No obvious rash, lesion, or ulcer.   LABORATORY PANEL:   CBC Recent Labs  Lab 05/27/22 2153  WBC 14.3*  HGB 14.6  HCT 41.8  PLT 230   ------------------------------------------------------------------------------------------------------------------  Chemistries  Recent Labs  Lab 05/27/22 2153  NA 136  K 2.9*  CL 96*  CO2 28  GLUCOSE 180*  BUN 23  CREATININE 1.34*  CALCIUM 9.1  AST 25  ALT 18  ALKPHOS 65  BILITOT 0.9   ------------------------------------------------------------------------------------------------------------------  Cardiac Enzymes No results for input(s): "TROPONINI" in the last 168  hours. ------------------------------------------------------------------------------------------------------------------  RADIOLOGY:  DG Chest Portable 1 View  Result Date: 05/27/2022 CLINICAL DATA:  Shortness of breath EXAM: PORTABLE CHEST 1 VIEW COMPARISON:  Radiographs 04/04/2019 FINDINGS: Cardiomegaly. Aortic atherosclerotic calcification. No focal consolidation, pleural effusion, or pneumothorax. No acute osseous abnormality. IMPRESSION: No active disease.  Cardiomegaly. Electronically Signed   By: Placido Sou M.D.   On: 05/27/2022 23:54   CT HEAD CODE STROKE WO CONTRAST  Result Date: 05/27/2022 CLINICAL DATA:  Code stroke.  Acute neurologic deficit EXAM: CT HEAD WITHOUT CONTRAST TECHNIQUE: Contiguous axial images were obtained from the base of the skull through the vertex without intravenous contrast. RADIATION DOSE REDUCTION: This exam was performed according to the departmental dose-optimization program which includes automated exposure control, adjustment of the mA and/or kV according to patient size and/or use of iterative reconstruction technique. COMPARISON:  02/26/2022 FINDINGS: Brain: There is advanced chronic microvascular change of the periventricular white matter. Recent right caudate head infarct. No acute hemorrhage. Vascular: No abnormal hyperdensity of the major intracranial arteries or dural venous sinuses. No intracranial atherosclerosis. Skull: The visualized skull base, calvarium and extracranial soft tissues are normal. Sinuses/Orbits: No fluid levels or advanced mucosal thickening of the visualized paranasal sinuses. No mastoid or middle ear effusion. The orbits are normal. ASPECTS [facility] Stroke Program Early CT Score) - Ganglionic level infarction (caudate, lentiform nuclei, internal capsule, insula, M1-M3 cortex): 7 - Supraganglionic infarction (M4-M6 cortex): 3 Total score (0-10 with 10 being normal): 10 IMPRESSION: 1. Recent (02/26/2022) right caudate head infarct  without acute hemorrhage. 2. ASPECTS is 10. These results were called by telephone at the time of interpretation on 05/27/2022 at 10:29 pm to provider [facility] , who verbally acknowledged these results. Electronically Signed   By: Ulyses Jarred M.D.   On: 05/27/2022 22:34      IMPRESSION AND PLAN:  Assessment and Plan: * Dysarthria - This could be related to TIA versus evolving CVA with history of recent acute CVA. - The patient will be admitted to an observation medically monitored bed.   - We will follow neuro checks q.4 hours for 24 hours.   - The patient will be continued on aspirin and Plavix. - Will obtain a brain MRI without contrast as well as bilateral carotid Doppler and 2D echo with bubble study .   - A neurology consultation  as well as physical/occupation/speech therapy consults will be obtained in a.m.Marland Kitchen -I notified Dr. Rory Percy about the patient. - The patient will be placed on statin therapy and fasting lipids will be checked.   Hypokalemia Potassium will be replaced and magnesium level will be checked.  Essential hypertension - We will continue antihypertensives with permissive hypertension parameters.  Dyslipidemia - We will continue statin therapy.  Peripheral neuropathy - We will continue Lyrica.  Depression - We will continue Celexa.   DVT prophylaxis: Lovenox. Advanced Care Planning:  Code Status: full code. Family Communication:  The plan of  care was discussed in details with the patient (and family). I answered all questions. The patient agreed to proceed with the above mentioned plan. Further management will depend upon hospital course. Disposition Plan: Back to previous home environment Consults called: Neurology. All the records are reviewed and case discussed with ED provider.  Status is: Observation  I certify that at the time of admission, it is my clinical judgment that the patient will require hospital care extending lessthan 2 midnights.                             Dispo: The patient is from: Home              Anticipated d/c is to: Home              Patient currently is not medically stable to d/c.              Difficult to place patient: No  Christel Mormon M.D on 05/28/2022 at 2:52 AM  [facility]   From 7 PM-7 AM, contact night-coverage [email or web address]  CC: Primary care physician; Maryland Pink, MD

## 2022-05-28 NOTE — ED Notes (Signed)
MD Mansy notified that pt failed the swallow screen and is NPO. Orders modified.

## 2022-05-28 NOTE — Assessment & Plan Note (Signed)
-   We will continue Lyrica.

## 2022-05-28 NOTE — Discharge Summary (Signed)
Physician Discharge Summary   Patient: Victoria Gutierrez MRN: [id number] DOB: Jan 22, 1946  Admit date:     05/27/2022  Discharge date: 05/28/22  Discharge Physician: Sharen Hones   PCP: [place] Pink, MD   Recommendations at discharge:   Follow-up with PCP in 1 week. Check a BMP at next office visit. May restart HCTZ at the next office visit if needed.  Discharge Diagnoses: Principal Problem:   Dysarthria Active Problems:   Hypokalemia   Essential hypertension   Dyslipidemia   Depression   Peripheral neuropathy   History of CVA (cerebrovascular accident) Acute kidney injury. Resolved Problems:   * No resolved hospital problems. *  Hospital Course: Victoria Gutierrez is a 76 y.o. Caucasian female with medical history significant for osteoarthritis, hearing loss, hypertension and CVA, who presented to the emergency room with acute onset of dysarthria starting around 5 PM.  Patient is seen by neurology, MRI of the brain did not show any new stroke.  Neurology opinion is that this is due to evolving recent stroke.  No need for change treatment.  Assessment and Plan: * Dysarthria No new stroke, resume prior treatment.  Hypokalemia Acute kidney injury. Magnesium level was normal.  Patient received potassium overnight.  Received 30 mill equivalent IV KCl again this morning when potassium went up to 3.2.  At this point, I will hold off HCTZ, continue oral potassium 20 mEq daily.  Patient need to follow-up with PCP to check a BMP in the office. Renal function has normalized.  Essential hypertension Resume home treatment, confirmed with daughter, patient was taking lisinopril 40 mg daily.  She will receive 1 dose today as blood pressure is elevated again.  Continue amlodipine.  Hold off HCTZ for acute kidney injury and hypokalemia.  Dyslipidemia - We will continue statin therapy.  Peripheral neuropathy - We will continue Lyrica.  Depression - We will continue Celexa.         Consultants: Neurology Procedures performed: None  Disposition: Home Diet recommendation:  Discharge Diet Orders (From admission, onward)     Start     Ordered   05/28/22 0000  Diet - low sodium heart healthy        05/28/22 1324           Cardiac diet DISCHARGE MEDICATION: Allergies as of 05/28/2022   No Known Allergies      Medication List     STOP taking these medications    citalopram 20 MG tablet Commonly known as: CELEXA   hydrochlorothiazide 12.5 MG capsule Commonly known as: MICROZIDE   methotrexate 2.5 MG tablet Commonly known as: RHEUMATREX       TAKE these medications    amLODipine 10 MG tablet Commonly known as: NORVASC Take 1 tablet (10 mg total) by mouth daily.   aspirin EC 325 MG tablet Take 1 tablet (325 mg total) by mouth daily.   atorvastatin 80 MG tablet Commonly known as: LIPITOR Take 1 tablet (80 mg total) by mouth daily at 6 PM.   clopidogrel 75 MG tablet Commonly known as: PLAVIX Take 1 tablet (75 mg total) by mouth daily.   lisinopril 40 MG tablet Commonly known as: ZESTRIL Take 40 mg by mouth daily.   Melatonin 5 MG Tbdp Take 5 mg by mouth at bedtime.   potassium chloride 10 MEQ tablet Commonly known as: KLOR-CON M Take 1 tablet (10 mEq total) by mouth 2 (two) times daily for 7 days.        Follow-up Information  [place] Pink, MD Follow up in 1 week(s).   Specialty: Family Medicine Contact information: [address] [place] [address] [phone]                Discharge Exam: Danley Danker Weights   05/27/22 2152  Weight: 68 kg   General exam: Appears calm and comfortable  Respiratory system: Clear to auscultation. Respiratory effort normal. Cardiovascular system: S1 & S2 heard, RRR. No JVD, murmurs, rubs, gallops or clicks. No pedal edema. Gastrointestinal system: Abdomen is nondistended, soft and nontender. No organomegaly or masses felt. Normal bowel sounds  heard. Central nervous system: Alert and oriented. No focal neurological deficits. Extremities: Symmetric 5 x 5 power. Skin: No rashes, lesions or ulcers Psychiatry: Judgement and insight appear normal. Mood & affect appropriate.    Condition at discharge: good  The results of significant diagnostics from this hospitalization (including imaging, microbiology, ancillary and laboratory) are listed below for reference.   Imaging Studies: MR BRAIN WO CONTRAST  Result Date: 05/28/2022 CLINICAL DATA:  Acute neurologic deficit EXAM: MRI HEAD WITHOUT CONTRAST TECHNIQUE: Multiplanar, multiecho pulse sequences of the brain and surrounding structures were obtained without intravenous contrast. COMPARISON:  02/26/2022 FINDINGS: Brain: No acute infarct, mass effect or extra-axial collection. No acute hemorrhage. 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 There is multifocal hyperintense T2-weighted signal within the white matter. Generalized volume loss. Old bilateral basal ganglia infarcts. The midline structures are normal. Vascular: Major flow voids are preserved. Skull and upper cervical spine: Normal calvarium and skull base. Visualized upper cervical spine and soft tissues are normal. Sinuses/Orbits:No paranasal sinus fluid levels or advanced mucosal thickening. No mastoid or middle ear effusion. Normal orbits. IMPRESSION: 1. No acute intracranial abnormality. 2. Old bilateral basal ganglia infarcts and findings of chronic small vessel disease. Electronically Signed   By: Ulyses Jarred M.D.   On: 05/28/2022 03:19   DG Chest Portable 1 View  Result Date: 05/27/2022 CLINICAL DATA:  Shortness of breath EXAM: PORTABLE CHEST 1 VIEW COMPARISON:  Radiographs 04/04/2019 FINDINGS: Cardiomegaly. Aortic atherosclerotic calcification. No focal consolidation, pleural effusion, or pneumothorax. No acute osseous abnormality. IMPRESSION: No active disease.  Cardiomegaly. Electronically Signed   By: Placido Sou M.D.   On: 05/27/2022 23:54   CT HEAD CODE STROKE WO CONTRAST  Result Date: 05/27/2022 CLINICAL DATA:  Code stroke.  Acute neurologic deficit EXAM: CT HEAD WITHOUT CONTRAST TECHNIQUE: Contiguous axial images were obtained from the base of the skull through the vertex without intravenous contrast. RADIATION DOSE REDUCTION: This exam was performed according to the departmental dose-optimization program which includes automated exposure control, adjustment of the mA and/or kV according to patient size and/or use of iterative reconstruction technique. COMPARISON:  02/26/2022 FINDINGS: Brain: There is advanced chronic microvascular change of the periventricular white matter. Recent right caudate head infarct. No acute hemorrhage. Vascular: No abnormal hyperdensity of the major intracranial arteries or dural venous sinuses. No intracranial atherosclerosis. Skull: The visualized skull base, calvarium and extracranial soft tissues are normal. Sinuses/Orbits: No fluid levels or advanced mucosal thickening of the visualized paranasal sinuses. No mastoid or middle ear effusion. The orbits are normal. ASPECTS [facility] Stroke Program Early CT Score) - Ganglionic level infarction (caudate, lentiform nuclei, internal capsule, insula, M1-M3 cortex): 7 - Supraganglionic infarction (M4-M6 cortex): 3 Total score (0-10 with 10 being normal): 10 IMPRESSION: 1. Recent (02/26/2022) right caudate head infarct without acute hemorrhage. 2. ASPECTS is 10. These results were called by telephone at the time of  interpretation on 05/27/2022 at 10:29 pm to provider [facility] , who verbally acknowledged these results. Electronically Signed   By: Ulyses Jarred M.D.   On: 05/27/2022 22:34    Microbiology: Results for orders placed or performed during the hospital encounter of 05/27/22  SARS Coronavirus 2 by RT PCR (hospital order, performed in [facility] hospital lab) *cepheid single result test* Anterior Nasal Swab      Status: None   Collection Time: 05/28/22 12:01 AM   Specimen: Anterior Nasal Swab  Result Value Ref Range Status   SARS Coronavirus 2 by RT PCR NEGATIVE NEGATIVE Final    Comment: (NOTE) SARS-CoV-2 target nucleic acids are NOT DETECTED.  The SARS-CoV-2 RNA is generally detectable in upper and lower respiratory specimens during the acute phase of infection. The lowest concentration of SARS-CoV-2 viral copies this assay can detect is 250 copies / mL. A negative result does not preclude SARS-CoV-2 infection and should not be used as the sole basis for treatment or other patient management decisions.  A negative result may occur with improper specimen collection / handling, submission of specimen other than nasopharyngeal swab, presence of viral mutation(s) within the areas targeted by this assay, and inadequate number of viral copies (<250 copies / mL). A negative result must be combined with clinical observations, patient history, and epidemiological information.  Fact Sheet for Patients:   https://www.patel.info/  Fact Sheet for Healthcare Providers: https://hall.com/  This test is not yet approved or  cleared by the [place] FDA and has been authorized for detection and/or diagnosis of SARS-CoV-2 by FDA under an Emergency Use Authorization (EUA).  This EUA will remain in effect (meaning this test can be used) for the duration of the COVID-19 declaration under Section 564(b)(1) of the Act, 21 U.S.C. section 360bbb-3(b)(1), unless the authorization is terminated or revoked sooner.  Performed at [facility], [address] [place], [place] [address]     Labs: CBC: Recent Labs  Lab 05/27/22 2153 05/28/22 0656  WBC 14.3* 9.6  NEUTROABS 11.1*  --   HGB 14.6 13.1  HCT 41.8 39.1  MCV 94.4 96.5  PLT 230 211   Basic Metabolic Panel: Recent Labs  Lab 05/27/22 2153 05/28/22 0656  NA 136 138  K 2.9* 3.2*  CL 96*  105  CO2 28 27  GLUCOSE 180* 101*  BUN 23 18  CREATININE 1.34* 0.98  CALCIUM 9.1 8.2*  MG  --  2.1   Liver Function Tests: Recent Labs  Lab 05/27/22 2153  AST 25  ALT 18  ALKPHOS 65  BILITOT 0.9  PROT 6.2*  ALBUMIN 3.4*   CBG: Recent Labs  Lab 05/27/22 2214  GLUCAP 216*    Discharge time spent: greater than 30 minutes.  Signed: Sharen Hones, MD [facility] 05/28/2022

## 2022-05-28 NOTE — Consult Note (Signed)
Clinical/Bedside Swallow Evaluation Patient Details  Name: Victoria Gutierrez MRN: [id number] Date of Birth: [birth year]  Today's Date: 05/28/2022 Time: SLP Start Time (ACUTE ONLY): 1150 SLP Stop Time (ACUTE ONLY): 1200 SLP Time Calculation (min) (ACUTE ONLY): 10 min  Past Medical History:  Past Medical History:  Diagnosis Date   Arthritis    knees, hands   H/O thyroid disease    No issues currently   HOH (hard of hearing)    Hypertension    Stroke ([place]) 12/2018   Wears hearing aid in both ears    has, does not wear   Past Surgical History:  Past Surgical History:  Procedure Laterality Date   ABDOMINAL HYSTERECTOMY     CATARACT EXTRACTION W/PHACO Right 09/18/2019   Procedure: CATARACT EXTRACTION PHACO AND INTRAOCULAR LENS PLACEMENT ([place]) RIGHT 11.65  01:03.3;  Surgeon: Birder Robson, MD;  Location: [place];  Service: Ophthalmology;  Laterality: Right;   CATARACT EXTRACTION W/PHACO Left 10/09/2019   Procedure: CATARACT EXTRACTION PHACO AND INTRAOCULAR LENS PLACEMENT ([facility]) LEFT 10.30  00:48.5;  Surgeon: Birder Robson, MD;  Location: [place];  Service: Ophthalmology;  Laterality: Left;   TOTAL HIP ARTHROPLASTY Left 04/05/2019   Procedure: TOTAL HIP ARTHROPLASTY ANTERIOR APPROACH;  Surgeon: Hessie Knows, MD;  Location: [facility];  Service: Orthopedics;  Laterality: Left;   HPI:  [name] is a 76 y.o. female past medical history of hypertension hyperlipidemia prior CVA who presents with increased difficulty walking and speech difficulty as well as drooling.  Patient is accompanied by her sister.  Tells me that tonight around 8 PM she started having more difficulty ambulating and required assistance to get to the bathroom which is not typical for her.  I also noticed her to be drooling on the left side of her mouth and apparently she had a choking episode due to the drooling.  Her sister who is at bedside thinks that the symptoms have significantly improved.   Patient currently denies much in the way of new symptoms she denies any recent illnesses fevers chills cough.  Denies chest pain shortness of breath.  Denies urinary symptoms. In talking to the patient's daughter patient had complained of some headache earlier in the day but was at her baseline.  Tonight around 7 PM was sitting at the dinner table when she slumped over.  Her daughter went to get her up and she was not able to walk and had some drooling and slurred speech that seem to be coming and going.  She had some shortness of breath at that time as well.  At baseline she does have some difficulty ambulating from her recent stroke but has worked with physical therapy and is now walking without assistance.  Her speech generally is normal at baseline. Patient was admitted back in September 2023 with left-sided facial droop and slurred speech found to have acute/subacute right caudate and lentiform nucleus and right internal capsule.  Patient started on aspirin and Plavix. NIH 2  Per Neuro consult: Recrudescence of stroke symptoms with no evidence of new stroke.  Clinical Indicators: CXR  cardiomegaly with no acute cardiopulmonary disease. MRI 1. No acute intracranial abnormality. 2. Old bilateral basal ganglia infarcts and findings of chronic small vessel disease. WBC leukocytosis of 14.3. TEMP 98.4   Assessment / Plan / Recommendation  Clinical Impression  Swallow: Patient presents with functional oropharyngeal swallow as demonstrated by no s/s aspiration across regular solids and thin liquids. Patient challenged with sequential sips and alternating consistencies  without s/s. Patient denied coughing or choking with AM meals. No s/s impairment or aspiration to warrant further dysphagia intervention.  Recommend continue regular solids and thin liquids.    Speech/Language/Cognitive Linguistic Screen: Per history patient with increased dysarthria prompting admission now resolved with 100%  intelligibility. Patient demonstrated functional y/n for wants and needs. Verbal fluent expression of thoughts without hesitations for naming. Appropriate responses during conversational speech and good command following during swallow evaluation indicative of functional auditory comprehension. Patient is assisted in cognitive ADLS at home as she lives with her daughter who manages medications, bills, driving etc. Status c/w BL per patient/family, no further concerns. Further, patient with negative stroke workup to suggest acute deficits therefore will defer formal [place] evaluation.   Given no s/s of acute impairment in SLP domains, will sign off. Please reconsult if further concerns arise or status changes.   SLP Visit Diagnosis: Dysarthria and anarthria (R47.1);Dysphagia, unspecified (R13.10)    Aspiration Risk  No limitations    Diet Recommendation   Regular solids Thin Liquids   Medication Administration: Whole meds with liquid    Other  Recommendations Oral Care Recommendations: Oral care BID    Recommendations for follow up therapy are one component of a multi-disciplinary discharge planning process, led by the attending physician.  Recommendations may be updated based on patient status, additional functional criteria and insurance authorization.  Follow up Recommendations No SLP follow up      Functional Status Assessment  (back to baseline)      Prognosis Prognosis for Safe Diet Advancement: Good      Swallow Study   General Date of Onset: 05/27/22 HPI: P/w increased dysarthria and reduced IND ambulation. PMH with R BG stroke in Sept 2023. Type of Study: Bedside Swallow Evaluation Diet Prior to this Study: Regular;Thin liquids Temperature Spikes Noted: No Respiratory Status: Room air History of Recent Intubation: No Behavior/Cognition: Alert;Cooperative;Pleasant mood Oral Cavity Assessment: Within Functional Limits Oral Care Completed by SLP: No Oral Cavity - Dentition:  Adequate natural dentition Vision: Functional for self-feeding Self-Feeding Abilities: Able to feed self Patient Positioning: Upright in bed Baseline Vocal Quality: Normal Volitional Cough: Strong Volitional Swallow: Able to elicit    Oral/Motor/Sensory Function Overall Oral Motor/Sensory Function: Within functional limits   Thin Liquid Thin Liquid: Within functional limits Presentation: Straw;Self Fed    Nectar Thick Nectar Thick Liquid: Not tested   Honey Thick Honey Thick Liquid: Not tested   Puree Puree: Within functional limits Presentation: Spoon   Solid    Ethelene Hal, M.A. CCC-SLP   Solid: Within functional limits Presentation: Self Fed      [place] 05/28/2022,12:14 PM

## 2022-05-28 NOTE — ED Notes (Signed)
Pt's daughter denies need for EMS transport; states feels confident can safely transport pt home herself.

## 2022-05-28 NOTE — Progress Notes (Signed)
BRIEF NO-CHARGE PROGRESS NOTE Patient seen as a telestroke consult.  Concern for new stroke versus recrudescence of old symptoms.  MRI of the brain negative for acute process.  Currently on dual antiplatelet for 3 months and high intensity statin.  Had stroke workup done less than 2 months ago.   Seen in the ER today. Daughter at bedside. Symptoms resolved. Had imaging done - MRI negative for acute process. No need to repeat stroke workup. CTA head and neck from September showed multifocal intracranial atherosclerosis which is being medically optimized with DAPT+statin x3 mo.  IMPRESSION Recrudescence of stroke symptoms with no evidence of new stroke.  Recs: Discharge home on dual antiplatelets and high intensity statin with follow-up with outpatient neurology in the next 4 to 6 weeks. Plan was discussed with Dr. Roosevelt Locks Inpatient neurology will be available as needed  -- Amie Portland, MD Neurologist [facility] Pager: [phone]

## 2022-05-28 NOTE — Assessment & Plan Note (Addendum)
-   This could be related to TIA versus evolving CVA with history of recent acute CVA. - The patient will be admitted to an observation medically monitored bed.   - We will follow neuro checks q.4 hours for 24 hours.   - The patient will be continued on aspirin and Plavix. - Will obtain a brain MRI without contrast as well as bilateral carotid Doppler and 2D echo with bubble study .   - A neurology consultation  as well as physical/occupation/speech therapy consults will be obtained in a.m.Marland Kitchen -I notified Dr. Rory Percy about the patient. - The patient will be placed on statin therapy and fasting lipids will be checked.

## 2022-05-28 NOTE — ED Notes (Signed)
Breakfast tray provided after repeat swallow screen performed

## 2022-05-28 NOTE — Evaluation (Addendum)
Physical Therapy Evaluation Patient Details Name: Victoria Gutierrez MRN: [id number] DOB: September 30, 1945 Today's Date: 05/28/2022  History of Present Illness  Pt is a 76 y.o. female admitted with dysarthria . PMH significant for HTN, HLD, hx of CVA, psoriasis, THR.  Clinical Impression  Pt found sleeping supine upon PT entry. Pt Mod I for bed mobility and Sit<>Stand With supervision for IV pole management. Pt ambulated 60 ft with no UE support and was able to maintain static standing balance during clothing management with no LOB. Pt and family reports she is at her baseline function. PT to sign off.        Recommendations for follow up therapy are one component of a multi-disciplinary discharge planning process, led by the attending physician.  Recommendations may be updated based on patient status, additional functional criteria and insurance authorization.  Follow Up Recommendations No PT follow up      Assistance Recommended at Discharge Intermittent Supervision/Assistance  Patient can return home with the following  A little help with walking and/or transfers;A little help with bathing/dressing/bathroom;Assistance with cooking/housework;Help with stairs or ramp for entrance;Assist for transportation    Equipment Recommendations None recommended by PT  Recommendations for Other Services       Functional Status Assessment Patient has not had a recent decline in their functional status     Precautions / Restrictions Precautions Precautions: Fall Restrictions Weight Bearing Restrictions: No      Mobility  Bed Mobility Overal bed mobility: Modified Independent             General bed mobility comments: HOB elevated and increased time    Transfers Overall transfer level: Modified independent Equipment used: None                    Ambulation/Gait Ambulation/Gait assistance: Supervision Gait Distance (Feet): 60 Feet Assistive device: IV Pole Gait  Pattern/deviations: WFL(Within Functional Limits), Wide base of support       General Gait Details: slowed gait with wide BOS. pt states this is her baseline  Financial trader Rankin (Stroke Patients Only)       Balance Overall balance assessment: Needs assistance Sitting-balance support: Feet supported Sitting balance-Leahy Scale: Good     Standing balance support: No upper extremity supported Standing balance-Leahy Scale: Fair                               Pertinent Vitals/Pain Pain Assessment Pain Assessment: Faces Faces Pain Scale: No hurt    Home Living Family/patient expects to be discharged to:: Private residence Living Arrangements: Children Available Help at Discharge: Family;Available 24 hours/day Type of Home: House Home Access: Stairs to enter Entrance Stairs-Rails: None Entrance Stairs-Number of Steps: 2 Alternate Level Stairs-Number of Steps: 2 steps to go from living area to bedroom area Home Layout: Multi-level Home Equipment: Kasandra Knudsen - single point Additional Comments: aquired from OT note 2 months prior    Prior Function Prior Level of Function : Independent/Modified Independent                     Hand Dominance   Dominant Hand: Right    Extremity/Trunk Assessment   Upper Extremity Assessment Upper Extremity Assessment: Overall WFL for tasks assessed    Lower Extremity Assessment Lower Extremity Assessment: Overall WFL for tasks assessed    Cervical /  Trunk Assessment Cervical / Trunk Assessment: Normal  Communication   Communication: HOH  Cognition Arousal/Alertness: Awake/alert Behavior During Therapy: Flat affect, WFL for tasks assessed/performed Overall Cognitive Status: Within Functional Limits for tasks assessed                                 General Comments: slow to reply to questions        General Comments      Exercises     Assessment/Plan     PT Assessment Patient does not need any further PT services  PT Problem List         PT Treatment Interventions      PT Goals (Current goals can be found in the Care Plan section)       Frequency       Co-evaluation               AM-PAC PT "6 Clicks" Mobility  Outcome Measure Help needed turning from your back to your side while in a flat bed without using bedrails?: None Help needed moving from lying on your back to sitting on the side of a flat bed without using bedrails?: None Help needed moving to and from a bed to a chair (including a wheelchair)?: None Help needed standing up from a chair using your arms (e.g., wheelchair or bedside chair)?: None Help needed to walk in hospital room?: None Help needed climbing 3-5 steps with a railing? : A Little 6 Click Score: 23    End of Session Equipment Utilized During Treatment: Gait belt Activity Tolerance: Patient tolerated treatment well Patient left: in bed;with call bell/phone within reach;with family/visitor present Nurse Communication: Mobility status PT Visit Diagnosis: Other abnormalities of gait and mobility (R26.89);Other symptoms and signs involving the nervous system (R29.898)    Time: 3151-7616 PT Time Calculation (min) (ACUTE ONLY): 20 min   Charges:              Claiborne Billings O'Daniel, SPT  05/28/2022, 12:54 PM

## 2022-05-28 NOTE — Assessment & Plan Note (Signed)
-   Potassium will be replaced and magnesium level will be checked. ?

## 2022-05-28 NOTE — Assessment & Plan Note (Signed)
-   We will continue Celexa.

## 2022-05-28 NOTE — ED Notes (Signed)
Attending at bedside.

## 2022-05-28 NOTE — ED Notes (Signed)
Pt assisted from stretcher to bed by this RN and daughter; pt steady with minimal assistance; pt's daughter reports pt's speech is nearly clear and the best it has been so far since slurred speech started.

## 2022-05-28 NOTE — Progress Notes (Signed)
OT Cancellation Note  Patient Details Name: Victoria Gutierrez MRN: [id number] DOB: Nov 24, 1945   Cancelled Treatment:    Reason Eval/Treat Not Completed: OT screened, no needs identified, will sign off. Order received, chart reviewed. Per conversation with PT, pt back to baseline functional independence. No skilled OT needs identified. Will sign off. Please re-consult if additional needs arise.   Dessie Coma, M.S. OTR/L  05/28/22, 12:53 PM  ascom [phone]

## 2022-05-28 NOTE — ED Notes (Signed)
Pt urinated. Pt assisted to change into paperscrubs as requested as pt's clothes previously sent home with daughter since were dirty.

## 2022-05-28 NOTE — ED Notes (Signed)
Patient transported to MRI 

## 2022-06-07 ENCOUNTER — Ambulatory Visit: Admit: 2022-06-07 | Disposition: A | Discharge status: Home / Self Care | Payer: Medicare HMO

## 2022-06-08 DIAGNOSIS — M25551 Pain in right hip: Secondary | ICD-10-CM | POA: Diagnosis not present

## 2022-06-08 DIAGNOSIS — S7001XA Contusion of right hip, initial encounter: Secondary | ICD-10-CM | POA: Diagnosis not present

## 2022-06-16 DIAGNOSIS — R262 Difficulty in walking, not elsewhere classified: Secondary | ICD-10-CM | POA: Diagnosis not present

## 2022-06-16 DIAGNOSIS — M25551 Pain in right hip: Secondary | ICD-10-CM | POA: Diagnosis not present

## 2022-06-16 DIAGNOSIS — M6281 Muscle weakness (generalized): Secondary | ICD-10-CM | POA: Diagnosis not present

## 2022-06-25 DIAGNOSIS — M25551 Pain in right hip: Secondary | ICD-10-CM | POA: Diagnosis not present

## 2022-06-25 DIAGNOSIS — R262 Difficulty in walking, not elsewhere classified: Secondary | ICD-10-CM | POA: Diagnosis not present

## 2022-06-25 DIAGNOSIS — M6281 Muscle weakness (generalized): Secondary | ICD-10-CM | POA: Diagnosis not present

## 2022-07-02 DIAGNOSIS — M25551 Pain in right hip: Secondary | ICD-10-CM | POA: Diagnosis not present

## 2022-07-02 DIAGNOSIS — R262 Difficulty in walking, not elsewhere classified: Secondary | ICD-10-CM | POA: Diagnosis not present

## 2022-07-02 DIAGNOSIS — M6281 Muscle weakness (generalized): Secondary | ICD-10-CM | POA: Diagnosis not present

## 2022-07-09 DIAGNOSIS — R262 Difficulty in walking, not elsewhere classified: Secondary | ICD-10-CM | POA: Diagnosis not present

## 2022-07-09 DIAGNOSIS — M25551 Pain in right hip: Secondary | ICD-10-CM | POA: Diagnosis not present

## 2022-07-09 DIAGNOSIS — M6281 Muscle weakness (generalized): Secondary | ICD-10-CM | POA: Diagnosis not present

## 2022-07-14 DIAGNOSIS — B3789 Other sites of candidiasis: Secondary | ICD-10-CM | POA: Diagnosis not present

## 2022-07-14 DIAGNOSIS — R262 Difficulty in walking, not elsewhere classified: Secondary | ICD-10-CM | POA: Diagnosis not present

## 2022-07-14 DIAGNOSIS — Z8673 Personal history of transient ischemic attack (TIA), and cerebral infarction without residual deficits: Secondary | ICD-10-CM | POA: Diagnosis not present

## 2022-07-14 DIAGNOSIS — M25551 Pain in right hip: Secondary | ICD-10-CM | POA: Diagnosis not present

## 2022-07-14 DIAGNOSIS — M6281 Muscle weakness (generalized): Secondary | ICD-10-CM | POA: Diagnosis not present

## 2022-07-16 DIAGNOSIS — M6281 Muscle weakness (generalized): Secondary | ICD-10-CM | POA: Diagnosis not present

## 2022-07-16 DIAGNOSIS — R262 Difficulty in walking, not elsewhere classified: Secondary | ICD-10-CM | POA: Diagnosis not present

## 2022-07-16 DIAGNOSIS — M25551 Pain in right hip: Secondary | ICD-10-CM | POA: Diagnosis not present

## 2022-07-21 DIAGNOSIS — M6281 Muscle weakness (generalized): Secondary | ICD-10-CM | POA: Diagnosis not present

## 2022-07-21 DIAGNOSIS — R262 Difficulty in walking, not elsewhere classified: Secondary | ICD-10-CM | POA: Diagnosis not present

## 2022-07-21 DIAGNOSIS — M25551 Pain in right hip: Secondary | ICD-10-CM | POA: Diagnosis not present

## 2022-07-26 DIAGNOSIS — M6281 Muscle weakness (generalized): Secondary | ICD-10-CM | POA: Diagnosis not present

## 2022-07-26 DIAGNOSIS — R262 Difficulty in walking, not elsewhere classified: Secondary | ICD-10-CM | POA: Diagnosis not present

## 2022-07-26 DIAGNOSIS — M25551 Pain in right hip: Secondary | ICD-10-CM | POA: Diagnosis not present

## 2022-08-06 DIAGNOSIS — R262 Difficulty in walking, not elsewhere classified: Secondary | ICD-10-CM | POA: Diagnosis not present

## 2022-08-06 DIAGNOSIS — M25551 Pain in right hip: Secondary | ICD-10-CM | POA: Diagnosis not present

## 2022-08-06 DIAGNOSIS — M6281 Muscle weakness (generalized): Secondary | ICD-10-CM | POA: Diagnosis not present

## 2022-08-13 DIAGNOSIS — R262 Difficulty in walking, not elsewhere classified: Secondary | ICD-10-CM | POA: Diagnosis not present

## 2022-08-13 DIAGNOSIS — M6281 Muscle weakness (generalized): Secondary | ICD-10-CM | POA: Diagnosis not present

## 2022-08-13 DIAGNOSIS — M25551 Pain in right hip: Secondary | ICD-10-CM | POA: Diagnosis not present

## 2022-08-16 DIAGNOSIS — M25551 Pain in right hip: Secondary | ICD-10-CM | POA: Diagnosis not present

## 2022-08-16 DIAGNOSIS — R262 Difficulty in walking, not elsewhere classified: Secondary | ICD-10-CM | POA: Diagnosis not present

## 2022-08-16 DIAGNOSIS — M6281 Muscle weakness (generalized): Secondary | ICD-10-CM | POA: Diagnosis not present

## 2022-08-18 DIAGNOSIS — R262 Difficulty in walking, not elsewhere classified: Secondary | ICD-10-CM | POA: Diagnosis not present

## 2022-08-18 DIAGNOSIS — M25551 Pain in right hip: Secondary | ICD-10-CM | POA: Diagnosis not present

## 2022-08-18 DIAGNOSIS — M6281 Muscle weakness (generalized): Secondary | ICD-10-CM | POA: Diagnosis not present

## 2022-08-23 DIAGNOSIS — R262 Difficulty in walking, not elsewhere classified: Secondary | ICD-10-CM | POA: Diagnosis not present

## 2022-08-23 DIAGNOSIS — M25551 Pain in right hip: Secondary | ICD-10-CM | POA: Diagnosis not present

## 2022-08-23 DIAGNOSIS — M6281 Muscle weakness (generalized): Secondary | ICD-10-CM | POA: Diagnosis not present

## 2022-08-24 DIAGNOSIS — M6281 Muscle weakness (generalized): Secondary | ICD-10-CM | POA: Diagnosis not present

## 2022-08-24 DIAGNOSIS — M25551 Pain in right hip: Secondary | ICD-10-CM | POA: Diagnosis not present

## 2022-08-24 DIAGNOSIS — R262 Difficulty in walking, not elsewhere classified: Secondary | ICD-10-CM | POA: Diagnosis not present

## 2022-09-02 NOTE — Unmapped (Signed)
Specialty Medication(s): Cosentyx    Diane Pugh has been dis-enrolled from the [facility] specialty pharmacy services due to  patient to follow up with [employer] regarding application. .    Additional information provided to the patient: see referrals tab for [employer] outreaches    Diane Pugh A Desiree Lucy [facility] Specialty Pharmacist

## 2022-09-02 NOTE — Unmapped (Signed)
[facility] Specialty Medication Onboarding    Specialty Medication: COSENTYX PEN (2 PENS) 150 mg/mL Pnij injection (secukinumab)  Prior Authorization: Not Required   Financial Assistance: No - patient must call for financial assistance per [employer] referral  Final Copay/Day Supply: $2,142.10 / 28 (MD)    Insurance Restrictions: Yes - max 1 month supply     Notes to Pharmacist: n/a    The triage team has completed the benefits investigation and has determined that the patient is able to fill this medication at [facility]. Please contact the patient to complete the onboarding or follow up with the prescribing physician as needed.

## 2022-09-03 DIAGNOSIS — M25551 Pain in right hip: Secondary | ICD-10-CM | POA: Diagnosis not present

## 2022-09-03 DIAGNOSIS — R262 Difficulty in walking, not elsewhere classified: Secondary | ICD-10-CM | POA: Diagnosis not present

## 2022-09-03 DIAGNOSIS — M6281 Muscle weakness (generalized): Secondary | ICD-10-CM | POA: Diagnosis not present

## 2022-09-09 DIAGNOSIS — M25551 Pain in right hip: Secondary | ICD-10-CM | POA: Diagnosis not present

## 2022-09-09 DIAGNOSIS — M6281 Muscle weakness (generalized): Secondary | ICD-10-CM | POA: Diagnosis not present

## 2022-09-09 DIAGNOSIS — R262 Difficulty in walking, not elsewhere classified: Secondary | ICD-10-CM | POA: Diagnosis not present

## 2022-09-27 DIAGNOSIS — M25551 Pain in right hip: Secondary | ICD-10-CM | POA: Diagnosis not present

## 2022-09-27 DIAGNOSIS — M6281 Muscle weakness (generalized): Secondary | ICD-10-CM | POA: Diagnosis not present

## 2022-09-27 DIAGNOSIS — R262 Difficulty in walking, not elsewhere classified: Secondary | ICD-10-CM | POA: Diagnosis not present

## 2022-10-04 ENCOUNTER — Emergency Department: Payer: Medicare HMO

## 2022-10-04 ENCOUNTER — Observation Stay
Admission: EM | Admit: 2022-10-04 | Discharge: 2022-10-05 | Disposition: A | Discharge status: Home / Self Care | Payer: Medicare HMO | Attending: Internal Medicine | Admitting: Internal Medicine

## 2022-10-04 ENCOUNTER — Encounter: Payer: Self-pay | Admitting: Emergency Medicine

## 2022-10-04 ENCOUNTER — Other Ambulatory Visit: Payer: Self-pay

## 2022-10-04 ENCOUNTER — Inpatient Hospital Stay: Payer: Medicare HMO

## 2022-10-04 DIAGNOSIS — G459 Transient cerebral ischemic attack, unspecified: Secondary | ICD-10-CM | POA: Diagnosis not present

## 2022-10-04 DIAGNOSIS — Z720 Tobacco use: Secondary | ICD-10-CM | POA: Diagnosis present

## 2022-10-04 DIAGNOSIS — G9341 Metabolic encephalopathy: Secondary | ICD-10-CM | POA: Diagnosis present

## 2022-10-04 DIAGNOSIS — Z79899 Other long term (current) drug therapy: Secondary | ICD-10-CM | POA: Diagnosis not present

## 2022-10-04 DIAGNOSIS — R4182 Altered mental status, unspecified: Secondary | ICD-10-CM | POA: Diagnosis not present

## 2022-10-04 DIAGNOSIS — U071 COVID-19: Secondary | ICD-10-CM | POA: Diagnosis not present

## 2022-10-04 DIAGNOSIS — D72829 Elevated white blood cell count, unspecified: Secondary | ICD-10-CM | POA: Insufficient documentation

## 2022-10-04 DIAGNOSIS — N390 Urinary tract infection, site not specified: Principal | ICD-10-CM | POA: Diagnosis present

## 2022-10-04 DIAGNOSIS — I959 Hypotension, unspecified: Secondary | ICD-10-CM | POA: Insufficient documentation

## 2022-10-04 DIAGNOSIS — M6281 Muscle weakness (generalized): Secondary | ICD-10-CM | POA: Diagnosis not present

## 2022-10-04 DIAGNOSIS — I10 Essential (primary) hypertension: Secondary | ICD-10-CM | POA: Diagnosis present

## 2022-10-04 DIAGNOSIS — I159 Secondary hypertension, unspecified: Secondary | ICD-10-CM

## 2022-10-04 DIAGNOSIS — R410 Disorientation, unspecified: Secondary | ICD-10-CM | POA: Diagnosis not present

## 2022-10-04 DIAGNOSIS — F17210 Nicotine dependence, cigarettes, uncomplicated: Secondary | ICD-10-CM | POA: Insufficient documentation

## 2022-10-04 DIAGNOSIS — G319 Degenerative disease of nervous system, unspecified: Secondary | ICD-10-CM | POA: Diagnosis not present

## 2022-10-04 DIAGNOSIS — R262 Difficulty in walking, not elsewhere classified: Secondary | ICD-10-CM | POA: Diagnosis not present

## 2022-10-04 DIAGNOSIS — R7989 Other specified abnormal findings of blood chemistry: Secondary | ICD-10-CM | POA: Diagnosis not present

## 2022-10-04 DIAGNOSIS — E876 Hypokalemia: Secondary | ICD-10-CM | POA: Diagnosis not present

## 2022-10-04 DIAGNOSIS — N179 Acute kidney failure, unspecified: Secondary | ICD-10-CM | POA: Diagnosis not present

## 2022-10-04 DIAGNOSIS — R001 Bradycardia, unspecified: Secondary | ICD-10-CM | POA: Diagnosis not present

## 2022-10-04 DIAGNOSIS — R531 Weakness: Secondary | ICD-10-CM | POA: Diagnosis not present

## 2022-10-04 DIAGNOSIS — I130 Hypertensive heart and chronic kidney disease with heart failure and stage 1 through stage 4 chronic kidney disease, or unspecified chronic kidney disease: Secondary | ICD-10-CM | POA: Insufficient documentation

## 2022-10-04 DIAGNOSIS — Z1152 Encounter for screening for COVID-19: Secondary | ICD-10-CM | POA: Diagnosis not present

## 2022-10-04 DIAGNOSIS — I5A Non-ischemic myocardial injury (non-traumatic): Secondary | ICD-10-CM | POA: Diagnosis not present

## 2022-10-04 DIAGNOSIS — Z8673 Personal history of transient ischemic attack (TIA), and cerebral infarction without residual deficits: Secondary | ICD-10-CM

## 2022-10-04 DIAGNOSIS — Z7902 Long term (current) use of antithrombotics/antiplatelets: Secondary | ICD-10-CM | POA: Diagnosis not present

## 2022-10-04 DIAGNOSIS — E785 Hyperlipidemia, unspecified: Secondary | ICD-10-CM | POA: Diagnosis present

## 2022-10-04 DIAGNOSIS — N1831 Chronic kidney disease, stage 3a: Secondary | ICD-10-CM | POA: Diagnosis not present

## 2022-10-04 DIAGNOSIS — W19XXXA Unspecified fall, initial encounter: Secondary | ICD-10-CM

## 2022-10-04 DIAGNOSIS — I5032 Chronic diastolic (congestive) heart failure: Secondary | ICD-10-CM | POA: Diagnosis present

## 2022-10-04 DIAGNOSIS — N3000 Acute cystitis without hematuria: Secondary | ICD-10-CM | POA: Diagnosis not present

## 2022-10-04 DIAGNOSIS — Y92009 Unspecified place in unspecified non-institutional (private) residence as the place of occurrence of the external cause: Secondary | ICD-10-CM

## 2022-10-04 LAB — CBC
HCT: 44 % (ref 36.0–46.0)
Hemoglobin: 14.9 g/dL (ref 12.0–15.0)
MCH: 32 pg (ref 26.0–34.0)
MCHC: 33.9 g/dL (ref 30.0–36.0)
MCV: 94.4 fL (ref 80.0–100.0)
Platelets: 281 10*3/uL (ref 150–400)
RBC: 4.66 MIL/uL (ref 3.87–5.11)
RDW: 12 % (ref 11.5–15.5)
WBC: 17.1 10*3/uL — ABNORMAL HIGH (ref 4.0–10.5)
nRBC: 0 % (ref 0.0–0.2)

## 2022-10-04 LAB — RESP PANEL BY RT-PCR (RSV, FLU A&B, COVID)  RVPGX2
Influenza A by PCR: NEGATIVE
Influenza B by PCR: NEGATIVE
Resp Syncytial Virus by PCR: NEGATIVE
SARS Coronavirus 2 by RT PCR: NEGATIVE

## 2022-10-04 LAB — COMPREHENSIVE METABOLIC PANEL
ALT: 15 U/L (ref 0–44)
AST: 25 U/L (ref 15–41)
Albumin: 3.7 g/dL (ref 3.5–5.0)
Alkaline Phosphatase: 63 U/L (ref 38–126)
Anion gap: 11 (ref 5–15)
BUN: 18 mg/dL (ref 8–23)
CO2: 28 mmol/L (ref 22–32)
Calcium: 9 mg/dL (ref 8.9–10.3)
Chloride: 99 mmol/L (ref 98–111)
Creatinine, Ser: 1.55 mg/dL — ABNORMAL HIGH (ref 0.44–1.00)
GFR, Estimated: 35 mL/min — ABNORMAL LOW (ref >60)
Glucose, Bld: 129 mg/dL — ABNORMAL HIGH (ref 70–99)
Potassium: 2.5 mmol/L — CL (ref 3.5–5.1)
Sodium: 138 mmol/L (ref 135–145)
Total Bilirubin: 1 mg/dL (ref 0.3–1.2)
Total Protein: 6.1 g/dL — ABNORMAL LOW (ref 6.5–8.1)

## 2022-10-04 LAB — URINALYSIS, COMPLETE (UACMP) WITH MICROSCOPIC
Bilirubin Urine: NEGATIVE
Glucose, UA: NEGATIVE mg/dL
Hgb urine dipstick: NEGATIVE
Ketones, ur: NEGATIVE mg/dL
Nitrite: NEGATIVE
Protein, ur: >=300 mg/dL — AB
Specific Gravity, Urine: 1.017 (ref 1.005–1.030)
pH: 5 (ref 5.0–8.0)

## 2022-10-04 LAB — TROPONIN I (HIGH SENSITIVITY)
Troponin I (High Sensitivity): 47 ng/L — ABNORMAL HIGH (ref <18)
Troponin I (High Sensitivity): 33 ng/L — ABNORMAL HIGH (ref <18)

## 2022-10-04 LAB — PROCALCITONIN: Procalcitonin: <0.1 ng/mL

## 2022-10-04 LAB — HEMOGLOBIN A1C
Hgb A1c MFr Bld: 5.9 % — ABNORMAL HIGH (ref 4.8–5.6)
Mean Plasma Glucose: 123 mg/dL

## 2022-10-04 LAB — LACTIC ACID, PLASMA
Lactic Acid, Venous: 2.5 mmol/L (ref 0.5–1.9)
Lactic Acid, Venous: 2 mmol/L (ref 0.5–1.9)

## 2022-10-04 LAB — BRAIN NATRIURETIC PEPTIDE: B Natriuretic Peptide: 96.6 pg/mL (ref 0.0–100.0)

## 2022-10-04 LAB — PHOSPHORUS: Phosphorus: 3 mg/dL (ref 2.5–4.6)

## 2022-10-04 LAB — MAGNESIUM: Magnesium: 2.1 mg/dL (ref 1.7–2.4)

## 2022-10-04 MED ORDER — ACETAMINOPHEN 325 MG PO TABS: 650.0000 mg | ORAL_TABLET | Freq: Four times a day (QID) | ORAL | Status: DC | PRN

## 2022-10-04 MED ORDER — POTASSIUM CHLORIDE CRYS ER 20 MEQ PO TBCR: 40.0000 meq | EXTENDED_RELEASE_TABLET | ORAL | Status: DC

## 2022-10-04 MED ORDER — ASPIRIN 325 MG PO TBEC
325.0000 mg | DELAYED_RELEASE_TABLET | Freq: Every day | ORAL | Status: DC
  Administered 2022-10-04 – 2022-10-05 (×2): 325 mg via ORAL
  Filled 2022-10-04 (×2): qty 1

## 2022-10-04 MED ORDER — SODIUM CHLORIDE 0.9 % IV BOLUS
1000.0000 mL | Freq: Once | INTRAVENOUS | Status: AC
  Administered 2022-10-04: 1000 mL via INTRAVENOUS

## 2022-10-04 MED ORDER — NICOTINE 21 MG/24HR TD PT24
21.0000 mg | MEDICATED_PATCH | Freq: Every day | TRANSDERMAL | Status: DC
  Administered 2022-10-04 – 2022-10-05 (×2): 21 mg via TRANSDERMAL
  Filled 2022-10-04 (×2): qty 1

## 2022-10-04 MED ORDER — POTASSIUM CHLORIDE 20 MEQ PO PACK
40.0000 meq | PACK | ORAL | Status: AC
  Administered 2022-10-04 (×2): 40 meq via ORAL
  Filled 2022-10-04 (×2): qty 2

## 2022-10-04 MED ORDER — DIPHENHYDRAMINE HCL 50 MG/ML IJ SOLN: 12.5000 mg | Freq: Three times a day (TID) | INTRAMUSCULAR | Status: DC | PRN

## 2022-10-04 MED ORDER — ACETAMINOPHEN 650 MG RE SUPP: 650.0000 mg | Freq: Four times a day (QID) | RECTAL | Status: DC | PRN

## 2022-10-04 MED ORDER — SODIUM CHLORIDE 0.9 % IV BOLUS
500.0000 mL | Freq: Once | INTRAVENOUS | Status: AC
  Administered 2022-10-04: 500 mL via INTRAVENOUS

## 2022-10-04 MED ORDER — SODIUM CHLORIDE 0.9 % IV SOLN: INTRAVENOUS | Status: DC

## 2022-10-04 MED ORDER — ATORVASTATIN CALCIUM 20 MG PO TABS
80.0000 mg | ORAL_TABLET | Freq: Every day | ORAL | Status: DC
  Administered 2022-10-04: 80 mg via ORAL
  Filled 2022-10-04: qty 4

## 2022-10-04 MED ORDER — SODIUM CHLORIDE 0.9 % IV SOLN: 1.0000 g | Freq: Once | INTRAVENOUS | Status: DC

## 2022-10-04 MED ORDER — ENOXAPARIN SODIUM 40 MG/0.4ML IJ SOSY
40.0000 mg | PREFILLED_SYRINGE | INTRAMUSCULAR | Status: DC
  Administered 2022-10-04: 40 mg via SUBCUTANEOUS
  Filled 2022-10-04: qty 0.4

## 2022-10-04 MED ORDER — POTASSIUM CHLORIDE 10 MEQ/100ML IV SOLN
10.0000 meq | Freq: Once | INTRAVENOUS | Status: AC
  Administered 2022-10-04: 10 meq via INTRAVENOUS
  Filled 2022-10-04: qty 100

## 2022-10-04 MED ORDER — SODIUM CHLORIDE 0.9 % IV SOLN
1.0000 g | INTRAVENOUS | Status: DC
  Administered 2022-10-04 – 2022-10-05 (×2): 1 g via INTRAVENOUS
  Filled 2022-10-04 (×2): qty 10

## 2022-10-04 MED ORDER — CLOPIDOGREL BISULFATE 75 MG PO TABS
75.0000 mg | ORAL_TABLET | Freq: Every day | ORAL | Status: DC
  Administered 2022-10-04 – 2022-10-05 (×2): 75 mg via ORAL
  Filled 2022-10-04 (×2): qty 1

## 2022-10-04 NOTE — H&P (Signed)
History and Physical    Victoria Gutierrez TKZ:[id number] DOB: 06/29/1945 DOA: 10/04/2022  Referring MD/NP/PA:   PCP: Jerl Mina, MD   Patient coming from:  The patient is coming from home.    Chief Complaint: weakness, increased urinary frequency and AMS.  HPI: Victoria Gutierrez is a 77 y.o. female with medical history significant of hypertension, hyperlipidemia, diastolic CHF, stroke, depression, CKD-3, hard of hearing, psoriasis, who presents with weakness, altered mental status, increased urinary frequency.  Per her daughter at bedside, patient started feeling weak since last night, and this morning she becomes confused.  At her normal baseline, patient is oriented x 3.  This morning patient is disorientated to place and time.  Her mental status has improved in the ED after giving IV fluid treatment.  When I saw patient in ED, patient is mildly confused, but orientated x 3.  She moves all extremities normally.  Patient denies chest pain, cough, shortness of breath.  No nausea, vomiting, diarrhea or abdominal pain.  Patient has increased urinary frequency, no dysuria, burning or urination, no hematuria.  Per her daughter, patient had mild fall this morning. Patient slowly lowered her body to ground without any injury.  Patient was found to have hypotension initially with blood pressure 88/64, which improved to 120/82 after giving 1 L normal saline in the ED.  Data reviewed independently and ED Course: pt was found to have WBC 17.1, lactic acid 2.5, positive urinalysis (hazy appearance, small amount of leukocyte, rare bacteria, WBC 21-50), potassium 2.5, magnesium 2.1, phosphorus 3.0, AKI with creatinine 1.55, BUN 18, GFR 35 (recent baseline creatinine 0.98 on 05/28/2022), negative PCR for COVID, flu and RSV.  Temperature normal, heart rate 90, RR 20, oxygen saturation 98% on room air.  Chest x-ray negative.  CT head negative.  MRI for brain is negative for acute intracranial issues, but showed remote  right basalr ganglia infarction. Pt is admitted to PCU as inpatient.   EKG: I have personally reviewed.  Sinus rhythm, QTc 513, bifascicular block, early R wave progression  Review of Systems:   General: no fevers, chills, no body weight gain, has fatigue HEENT: no blurry vision, hearing changes or sore throat Respiratory: no dyspnea, coughing, wheezing CV: no chest pain, no palpitations GI: no nausea, vomiting, abdominal pain, diarrhea, constipation GU: no dysuria, burning on urination, has increased urinary frequency, no hematuria  Ext: no leg edema Neuro: no unilateral weakness, numbness, or tingling, no vision change or hearing loss. Has fall. Skin: no rash, no skin tear. MSK: No muscle spasm, no deformity, no limitation of range of movement in spin Heme: No easy bruising.  Travel history: No recent long distant travel.   Allergy: No Known Allergies  Past Medical History:  Diagnosis Date   Arthritis    knees, hands   H/O thyroid disease    No issues currently   HOH (hard of hearing)    Hypertension    Stroke 12/2018   Wears hearing aid in both ears    has, does not wear    Past Surgical History:  Procedure Laterality Date   ABDOMINAL HYSTERECTOMY     CATARACT EXTRACTION W/PHACO Right 09/18/2019   Procedure: CATARACT EXTRACTION PHACO AND INTRAOCULAR LENS PLACEMENT (IOC) RIGHT 11.65  01:03.3;  Surgeon: Galen Manila, MD;  Location: [facility];  Service: Ophthalmology;  Laterality: Right;   CATARACT EXTRACTION W/PHACO Left 10/09/2019   Procedure: CATARACT EXTRACTION PHACO AND INTRAOCULAR LENS PLACEMENT (IOC) LEFT 10.30  00:48.5;  Surgeon:  Galen Manila, MD;  Location: [facility];  Service: Ophthalmology;  Laterality: Left;   TOTAL HIP ARTHROPLASTY Left 04/05/2019   Procedure: TOTAL HIP ARTHROPLASTY ANTERIOR APPROACH;  Surgeon: Kennedy Bucker, MD;  Location: [facility];  Service: Orthopedics;  Laterality: Left;    Social History:  reports that she  has been smoking cigarettes. She started smoking about 35 years ago. She has a 19.00 pack-year smoking history. She has never used smokeless tobacco. She reports that she does not drink alcohol and does not use drugs.  Family History:  Family History  Problem Relation Age of Onset   Heart disease Mother    Heart disease Father    Breast cancer Neg Hx      Prior to Admission medications   Medication Sig Start Date End Date Taking? Authorizing Provider  amLODipine (NORVASC) 10 MG tablet Take 1 tablet (10 mg total) by mouth daily. 03/01/22   Lonia Blood, MD  atorvastatin (LIPITOR) 80 MG tablet Take 1 tablet (80 mg total) by mouth daily at 6 PM. 02/28/22   Lonia Blood, MD  clopidogrel (PLAVIX) 75 MG tablet Take 1 tablet (75 mg total) by mouth daily. 03/01/22   Lonia Blood, MD  lisinopril (ZESTRIL) 40 MG tablet Take 40 mg by mouth daily.  Patient not taking: Reported on 05/27/2022    [provider]  Melatonin 5 MG TBDP Take 5 mg by mouth at bedtime.  Patient not taking: Reported on 05/27/2022 05/12/16   [provider]  potassium chloride (KLOR-CON M) 10 MEQ tablet Take 1 tablet (10 mEq total) by mouth 2 (two) times daily for 7 days. 05/28/22 06/04/22  Marrion Coy, MD    Physical Exam: Vitals:   10/04/22 1130 10/04/22 1200 10/04/22 1330 10/04/22 1500  BP: 120/82  121/75 120/70  Pulse: 90  91 84  Resp: 13  (!) 21 (!) 22  Temp:      SpO2: 98%  95% 95%  Weight:  72.6 kg     General: Not in acute distress HEENT:       Eyes: PERRL, EOMI, no scleral icterus.       ENT: No discharge from the ears and nose, no pharynx injection, no tonsillar enlargement.        Neck: No JVD, no bruit, no mass felt. Heme: No neck lymph node enlargement. Cardiac: S1/S2, RRR, No murmurs, No gallops or rubs. Respiratory: No rales, wheezing, rhonchi or rubs. GI: Soft, nondistended, nontender, no rebound pain, no organomegaly, BS present. GU: No hematuria Ext: No pitting leg  edema bilaterally. 1+DP/PT pulse bilaterally. Musculoskeletal: No joint deformities, No joint redness or warmth, no limitation of ROM in spin. Skin: No rashes.  Neuro: Patient is mildly confused, lethargic, but currently oriented X3, cranial nerves II-XII grossly intact, moves all extremities normally.  Psych: Patient is not psychotic, no suicidal or hemocidal ideation.  Labs on Admission: I have personally reviewed following labs and imaging studies  CBC: Recent Labs  Lab 10/04/22 0937  WBC 17.1*  HGB 14.9  HCT 44.0  MCV 94.4  PLT 281   Basic Metabolic Panel: Recent Labs  Lab 10/04/22 0937  NA 138  K 2.5*  CL 99  CO2 28  GLUCOSE 129*  BUN 18  CREATININE 1.55*  CALCIUM 9.0  MG 2.1  PHOS 3.0   GFR: Estimated Creatinine Clearance: 30 mL/min (A) (by C-G formula based on SCr of 1.55 mg/dL (H)). Liver Function Tests: Recent Labs  Lab 10/04/22 [phone]  AST 25  ALT 15  ALKPHOS 63  BILITOT 1.0  PROT 6.1*  ALBUMIN 3.7   No results for input(s): "LIPASE", "AMYLASE" in the last 168 hours. No results for input(s): "AMMONIA" in the last 168 hours. Coagulation Profile: No results for input(s): "INR", "PROTIME" in the last 168 hours. Cardiac Enzymes: No results for input(s): "CKTOTAL", "CKMB", "CKMBINDEX", "TROPONINI" in the last 168 hours. BNP (last 3 results) No results for input(s): "PROBNP" in the last 8760 hours. HbA1C: No results for input(s): "HGBA1C" in the last 72 hours. CBG: No results for input(s): "GLUCAP" in the last 168 hours. Lipid Profile: No results for input(s): "CHOL", "HDL", "LDLCALC", "TRIG", "CHOLHDL", "LDLDIRECT" in the last 72 hours. Thyroid Function Tests: No results for input(s): "TSH", "T4TOTAL", "FREET4", "T3FREE", "THYROIDAB" in the last 72 hours. Anemia Panel: No results for input(s): "VITAMINB12", "FOLATE", "FERRITIN", "TIBC", "IRON", "RETICCTPCT" in the last 72 hours. Urine analysis:    Component Value Date/Time   COLORURINE YELLOW (A)  10/04/2022 1116   APPEARANCEUR HAZY (A) 10/04/2022 1116   APPEARANCEUR Cloudy (A) 03/07/2019 1132   LABSPEC 1.017 10/04/2022 1116   LABSPEC 1.011 08/11/2011 2253   PHURINE 5.0 10/04/2022 1116   GLUCOSEU NEGATIVE 10/04/2022 1116   GLUCOSEU Negative 08/11/2011 2253   HGBUR NEGATIVE 10/04/2022 1116   BILIRUBINUR NEGATIVE 10/04/2022 1116   BILIRUBINUR Negative 03/07/2019 1132   BILIRUBINUR Negative 08/11/2011 2253   KETONESUR NEGATIVE 10/04/2022 1116   PROTEINUR >=300 (A) 10/04/2022 1116   NITRITE NEGATIVE 10/04/2022 1116   LEUKOCYTESUR SMALL (A) 10/04/2022 1116   LEUKOCYTESUR Negative 08/11/2011 2253   Sepsis Labs: @LABRCNTIP (procalcitonin:4,lacticidven:4) ) Recent Results (from the past 240 hour(s))  Resp panel by RT-PCR (RSV, Flu A&B, Covid) Anterior Nasal Swab     Status: None   Collection Time: 10/04/22  9:37 AM   Specimen: Anterior Nasal Swab  Result Value Ref Range Status   SARS Coronavirus 2 by RT PCR NEGATIVE NEGATIVE Final    Comment: (NOTE) SARS-CoV-2 target nucleic acids are NOT DETECTED.  The SARS-CoV-2 RNA is generally detectable in upper respiratory specimens during the acute phase of infection. The lowest concentration of SARS-CoV-2 viral copies this assay can detect is 138 copies/mL. A negative result does not preclude SARS-Cov-2 infection and should not be used as the sole basis for treatment or other patient management decisions. A negative result may occur with  improper specimen collection/handling, submission of specimen other than nasopharyngeal swab, presence of viral mutation(s) within the areas targeted by this assay, and inadequate number of viral copies(<138 copies/mL). A negative result must be combined with clinical observations, patient history, and epidemiological information. The expected result is Negative.  Fact Sheet for Patients:  BloggerCourse.comhttps://www.fda.gov/media/152166/download  Fact Sheet for Healthcare Providers:   SeriousBroker.ithttps://www.fda.gov/media/152162/download  This test is no t yet approved or cleared by the [place] FDA and  has been authorized for detection and/or diagnosis of SARS-CoV-2 by FDA under an Emergency Use Authorization (EUA). This EUA will remain  in effect (meaning this test can be used) for the duration of the COVID-19 declaration under Section 564(b)(1) of the Act, 21 U.S.C.section 360bbb-3(b)(1), unless the authorization is terminated  or revoked sooner.       Influenza A by PCR NEGATIVE NEGATIVE Final   Influenza B by PCR NEGATIVE NEGATIVE Final    Comment: (NOTE) The Xpert Xpress SARS-CoV-2/FLU/RSV plus assay is intended as an aid in the diagnosis of influenza from Nasopharyngeal swab specimens and should not be used as a sole basis for treatment.  Nasal washings and aspirates are unacceptable for Xpert Xpress SARS-CoV-2/FLU/RSV testing.  Fact Sheet for Patients: BloggerCourse.com  Fact Sheet for Healthcare Providers: SeriousBroker.it  This test is not yet approved or cleared by the [place] FDA and has been authorized for detection and/or diagnosis of SARS-CoV-2 by FDA under an Emergency Use Authorization (EUA). This EUA will remain in effect (meaning this test can be used) for the duration of the COVID-19 declaration under Section 564(b)(1) of the Act, 21 U.S.C. section 360bbb-3(b)(1), unless the authorization is terminated or revoked.     Resp Syncytial Virus by PCR NEGATIVE NEGATIVE Final    Comment: (NOTE) Fact Sheet for Patients: BloggerCourse.com  Fact Sheet for Healthcare Providers: SeriousBroker.it  This test is not yet approved or cleared by the [place] FDA and has been authorized for detection and/or diagnosis of SARS-CoV-2 by FDA under an Emergency Use Authorization (EUA). This EUA will remain in effect (meaning this test can be used) for  the duration of the COVID-19 declaration under Section 564(b)(1) of the Act, 21 U.S.C. section 360bbb-3(b)(1), unless the authorization is terminated or revoked.  Performed at [facility], [address] [place], [place] [address]      Radiological Exams on Admission: MR BRAIN WO CONTRAST  Result Date: 10/04/2022 CLINICAL DATA:  Mental status change, unknown cause EXAM: MRI HEAD WITHOUT CONTRAST TECHNIQUE: Multiplanar, multiecho pulse sequences of the brain and surrounding structures were obtained without intravenous contrast. COMPARISON:  MRI head 05/28/2022. FINDINGS: Brain: Prior right basal ganglia infarct with evidence of some hemorrhage in this region and associated encephalomalacia/gliosis. No evidence of acute infarct, acute hemorrhage, mass lesion or midline shift. Small remote right cerebellar infarct. Scattered T2/FLAIR hyperintensity in the white matter, nonspecific but compatible with chronic microvascular ischemic disease. Cerebral atrophy. Vascular: Major arterial flow voids are maintained. Skull and upper cervical spine: Normal marrow signal. Sinuses/Orbits: Left maxillary sinus retention cyst. No acute orbital finding. Other: No mastoid effusions. IMPRESSION: 1. No evidence of acute abnormality. 2. Remote right basal ganglia infarct and chronic microvascular ischemic change. Electronically Signed   By: Feliberto Harts M.D.   On: 10/04/2022 12:40   DG Chest Portable 1 View  Result Date: 10/04/2022 CLINICAL DATA:  Confusion, weakness EXAM: PORTABLE CHEST 1 VIEW COMPARISON:  05/27/2022 FINDINGS: Transverse diameter of heart is increased. There are no signs of pulmonary edema or new focal pulmonary consolidation. Crowding of markings in the medial right lower lung field has not changed significantly. There is no pleural effusion or pneumothorax. IMPRESSION: There are no new infiltrates or signs of pulmonary edema. Electronically Signed   By: Ernie Avena M.D.   On:  10/04/2022 11:18   CT HEAD WO CONTRAST ( )  Result Date: 10/04/2022 CLINICAL DATA:  Mental status change, unknown cause EXAM: CT HEAD WITHOUT CONTRAST TECHNIQUE: Contiguous axial images were obtained from the base of the skull through the vertex without intravenous contrast. RADIATION DOSE REDUCTION: This exam was performed according to the departmental dose-optimization program which includes automated exposure control, adjustment of the mA and/or kV according to patient size and/or use of iterative reconstruction technique. COMPARISON:  CT head November 30, 23. FINDINGS: Brain: Remote perforator infarct in the right basal ganglia. Patchy white matter hypodensities, nonspecific but compatible with chronic microvascular ischemic disease. No evidence of acute large vascular territory infarct, acute hemorrhage, mass lesion, or midline shift. Vascular: Calcific atherosclerosis.  No hyperdense vessel. Skull: No acute fracture. Sinuses/Orbits: Left maxillary sinus retention cyst. No acute orbital findings. Other: No  mastoid effusions. IMPRESSION: No evidence of acute intracranial abnormality. Electronically Signed   By: Feliberto Harts M.D.   On: 10/04/2022 10:42      Assessment/Plan Principal Problem:   UTI (urinary tract infection) Active Problems:   Fall at home, initial encounter   Acute metabolic encephalopathy   Acute renal failure superimposed on stage 3a chronic kidney disease   Hyperlipidemia   Myocardial injury   Hypokalemia   Hypertension   History of CVA (cerebrovascular accident)   Chronic diastolic CHF (congestive heart failure)   Tobacco abuse   Assessment and Plan:  UTI (urinary tract infection): Patient has leukocytosis 17.1, and had hypotension initially, which responded to IV fluid resuscitation.  No fever, heart rate 90, RR 20, does not meet criteria for sepsis.  Lactic acid 2.5.  -Admitted to PCU as inpatient -IV Rocephin -Follow-up blood culture and urine  culture -IV fluid: 1.5 L normal saline, then 75 cc/h -Follow-up of blood culture and urine culture -Trend lactic acid level -Check procalcitonin level  Fall at home, initial encounter and acute metabolic encephalopathy: Likely due to UTI.  MRI of the brain is negative for new stroke. -Fall precaution -Frequent neurocheck -PT/OT  Acute renal failure superimposed on stage 3a chronic kidney disease: Likely due to UTI.  ATN is also possible due to hypotension -Follow-up with BMP -Hold lisinopril -IV as above  Hyperlipidemia -Lipitor  Myocardial injury: Troponin level 47, no chest pain -Aspirin, Lipitor, Plavix -Trend troponin -Check A1c, FLP  Hypokalemia: potassium level 2.5 -Repleted potassium  Hypertension -Hold blood pressure medications: Amlodipine and lisinopril due to hypotension  History of CVA (cerebrovascular accident) -Aspirin, Plavix, Lipitor  Chronic diastolic CHF (congestive heart failure): 2D echo 02/27/2022 showed EF of 65-70% with grade 1 diastolic dysfunction.  Patient does not have leg edema JVD.  BNP is normal 96.6.  CHF is compensated -Watch volume status closely  Tobacco abuse -Nicotine patch      DVT ppx: SQ Lovenox  Code Status: Full code  Family Communication:   Yes, patient's  daughter  at bed side.         Disposition Plan:  Anticipate discharge back to previous environment  Consults called:  none  Admission status and Level of care: Progressive:   as inpt      Dispo: The patient is from: Home              Anticipated d/c is to: Home              Anticipated d/c date is: 2 days              Patient currently is not medically stable to d/c.    Severity of Illness:  The appropriate patient status for this patient is INPATIENT. Inpatient status is judged to be reasonable and necessary in order to provide the required intensity of service to ensure the patient's safety. The patient's presenting symptoms, physical exam findings, and  initial radiographic and laboratory data in the context of their chronic comorbidities is felt to place them at high risk for further clinical deterioration. Furthermore, it is not anticipated that the patient will be medically stable for discharge from the hospital within 2 midnights of admission.   * I certify that at the point of admission it is my clinical judgment that the patient will require inpatient hospital care spanning beyond 2 midnights from the point of admission due to high intensity of service, high risk for further deterioration and high frequency of  surveillance required.*       Date of Service 10/04/2022    Lorretta Harp [facility]   If 7PM-7AM, please contact night-coverage [email or web address] 10/04/2022, 5:39 PM

## 2022-10-04 NOTE — ED Provider Notes (Signed)
[facility] Provider Note    Event Date/Time   First MD Initiated Contact with Patient 10/04/22 [id number]     (approximate)  History   Chief Complaint: Altered Mental Status  HPI  Victoria Gutierrez is a 77 y.o. female with a past medical history of arthritis, hypertension, prior CVA, presents to the emergency department for weakness and difficulty ambulating as well as now confusion.  According to the daughter the patient was feeling weak yesterday to the point where the daughter had to give her a bath instead of a shower.  This morning the patient continued to feel even more weak and the daughter noted that the patient was confused, for instance did not know what year it was or her birthday both of which the patient would normally know per daughter.  Here patient states she is feeling better but states the year is 2019 and was unable to guess her birthdate daughter states both of which she would typically now without issue.  Daughter states many TIAs as well as 3 CVAs previously.  Patient denies any medical complaints no chest pain.  Patient noted to be hypotensive 88/64.  No distress, lying in bed, calm and cooperative.  Physical Exam   Triage Vital Signs: ED Triage Vitals  Enc Vitals Group     BP 10/04/22 0938 (!) 88/64     Pulse Rate 10/04/22 0938 89     Resp 10/04/22 0938 19     Temp 10/04/22 0938 97.8 F (36.6 C)     Temp src --      SpO2 10/04/22 0938 98 %     Weight --      Height --      Head Circumference --      Peak Flow --      Pain Score 10/04/22 0945 0     Pain Loc --      Pain Edu? --      Excl. in GC? --     Most recent vital signs: Vitals:   10/04/22 0938  BP: (!) 88/64  Pulse: 89  Resp: 19  Temp: 97.8 F (36.6 C)  SpO2: 98%    General: Awake, no distress.  CV:  Good peripheral perfusion.  Regular rate and rhythm  Resp:  Normal effort.  Equal breath sounds bilaterally.  Abd:  No distention.  Soft, nontender.  No rebound or  guarding.  ED Results / Procedures / Treatments   EKG  EKG viewed and interpreted by myself shows a normal sinus rhythm at 89 bpm with a narrow QRS, normal axis, normal intervals besides slight QTc prolongation, nonspecific ST changes no ST elevation.  Right bundle branch block.  RADIOLOGY  I have reviewed and interpreted the CT images.  No obvious bleed on my evaluation. Radiology is read the CT scan is negative for acute abnormality.   MEDICATIONS ORDERED IN ED: Medications - No data to display   IMPRESSION / MDM / ASSESSMENT AND PLAN / ED COURSE  I reviewed the triage vital signs and the nursing notes.  Patient's presentation is most consistent with acute presentation with potential threat to life or bodily function.  Patient presents emergency department for increased weakness and confusion.  Daughter states history of similar events previously with TIAs.  Here the patient is awake she is alert she is answering questions but is confused is unable to tell me the year or her birthdate.  Patient does have great grip strength able to move  all extremities well, 4+/5 strength in bilateral lower extremities 5/5 in bilateral upper extremities.  At baseline patient uses a walker and gait belt, has home PT as well.  Given the patient's increased weakness with confusion and hypotension we will check labs looking for any metabolic or electrolyte abnormality or renal dysfunction, we will IV hydrate, we will obtain cardiac enzymes and EKG.  Given the patient's weakness we will also check a COVID swab.  Will obtain a urinalysis to help rule out urinary tract infection.  Daughter agreeable to plan of care.  Lab work has resulted showing significant leukocytosis of 17,000, COVID and flu are negative, chemistry shows hypokalemia with a potassium of 2.5 we will replete with IV and oral potassium.  Creatinine of 1.55 with a baseline around 1.0 patient receiving IV fluids.  Troponin slightly elevated at 47,  unclear if this is ACS related although much less likely.  CT scan of the head shows no acute abnormality.  Patient appears to be improving with fluids, urinalysis and chest x-ray pending.  No sign of LVO on exam.  Patient will likely require MRI.  Patient's urinalysis is concerning for urinary tract infection with 21-50 white blood cells and hazy appearing urine.  Will cover with IV Rocephin and send a urine culture.  Patient admitted to the hospital service for further workup and treatment.  FINAL CLINICAL IMPRESSION(S) / ED DIAGNOSES   Weakness Hypotension Confusion  Note:  This document was prepared using Dragon voice recognition software and may include unintentional dictation errors.   Minna Antis, MD 10/04/22 1156

## 2022-10-04 NOTE — ED Notes (Signed)
Date and time results received: 10/04/22 1019 (use smartphrase ".now" to insert current time)  Test: K+ Critical Value: 2.5  Name of Provider Notified: Dr. Lenard Lance  Orders Received? Or Actions Taken?: see chart

## 2022-10-04 NOTE — ED Notes (Signed)
Date and time results received: 10/04/22 1432 (use smartphrase ".now" to insert current time)  Test: Lactic acid Critical Value: 2.5   Name of Provider Notified: Dr. Clyde Lundborg  Orders Received? Or Actions Taken?: see chart

## 2022-10-04 NOTE — ED Triage Notes (Signed)
Pt from home via [facility] with reports of confusion and weakness. Per family pt went to bed last night at midnight normal. Family does report pt was generally weak last night. Family reporting upon waking this morning pt was confused and unable to stand.

## 2022-10-05 DIAGNOSIS — R4182 Altered mental status, unspecified: Secondary | ICD-10-CM

## 2022-10-05 DIAGNOSIS — E7800 Pure hypercholesterolemia, unspecified: Secondary | ICD-10-CM

## 2022-10-05 DIAGNOSIS — G9341 Metabolic encephalopathy: Secondary | ICD-10-CM | POA: Diagnosis not present

## 2022-10-05 DIAGNOSIS — N3000 Acute cystitis without hematuria: Secondary | ICD-10-CM | POA: Diagnosis not present

## 2022-10-05 LAB — LIPID PANEL
Cholesterol: 152 mg/dL (ref 0–200)
HDL: 36 mg/dL — ABNORMAL LOW (ref >40)
LDL Cholesterol: 91 mg/dL (ref 0–99)
Total CHOL/HDL Ratio: 4.2 RATIO
Triglycerides: 126 mg/dL (ref <150)
VLDL: 25 mg/dL (ref 0–40)

## 2022-10-05 LAB — CBC
HCT: 41.3 % (ref 36.0–46.0)
Hemoglobin: 13.3 g/dL (ref 12.0–15.0)
MCH: 32.1 pg (ref 26.0–34.0)
MCHC: 32.2 g/dL (ref 30.0–36.0)
MCV: 99.8 fL (ref 80.0–100.0)
Platelets: 218 10*3/uL (ref 150–400)
RBC: 4.14 MIL/uL (ref 3.87–5.11)
RDW: 12 % (ref 11.5–15.5)
WBC: 8.8 10*3/uL (ref 4.0–10.5)
nRBC: 0 % (ref 0.0–0.2)

## 2022-10-05 LAB — BASIC METABOLIC PANEL
Anion gap: 9 (ref 5–15)
BUN: 12 mg/dL (ref 8–23)
CO2: 22 mmol/L (ref 22–32)
Calcium: 7.9 mg/dL — ABNORMAL LOW (ref 8.9–10.3)
Chloride: 109 mmol/L (ref 98–111)
Creatinine, Ser: 0.94 mg/dL (ref 0.44–1.00)
GFR, Estimated: >60 mL/min (ref >60)
Glucose, Bld: 93 mg/dL (ref 70–99)
Potassium: 2.8 mmol/L — ABNORMAL LOW (ref 3.5–5.1)
Sodium: 140 mmol/L (ref 135–145)

## 2022-10-05 LAB — CBG MONITORING, ED: Glucose-Capillary: 165 mg/dL — ABNORMAL HIGH (ref 70–99)

## 2022-10-05 LAB — URINE CULTURE: Culture: NO GROWTH

## 2022-10-05 MED ORDER — AMLODIPINE BESYLATE 5 MG PO TABS
10.0000 mg | ORAL_TABLET | Freq: Every day | ORAL | Status: DC
  Administered 2022-10-05: 10 mg via ORAL
  Filled 2022-10-05: qty 2

## 2022-10-05 MED ORDER — CEPHALEXIN 500 MG PO CAPS: 500.0000 mg | ORAL_CAPSULE | Freq: Three times a day (TID) | ORAL | 0 refills | Status: AC

## 2022-10-05 MED ORDER — POTASSIUM CHLORIDE CRYS ER 20 MEQ PO TBCR
40.0000 meq | EXTENDED_RELEASE_TABLET | ORAL | Status: DC
  Administered 2022-10-05 (×2): 40 meq via ORAL
  Filled 2022-10-05 (×2): qty 2

## 2022-10-05 MED ORDER — LABETALOL HCL 5 MG/ML IV SOLN
10.0000 mg | Freq: Once | INTRAVENOUS | Status: AC
  Administered 2022-10-05: 10 mg via INTRAVENOUS
  Filled 2022-10-05: qty 4

## 2022-10-05 MED ORDER — LISINOPRIL 10 MG PO TABS
20.0000 mg | ORAL_TABLET | Freq: Every day | ORAL | Status: DC
  Administered 2022-10-05: 20 mg via ORAL
  Filled 2022-10-05: qty 2

## 2022-10-05 NOTE — Progress Notes (Signed)
Daughter provided code 92, reports no discharge needs she will be at hospital around 1pm to pick patient up, treatment team made aware.   Darolyn Rua, [place], MSW, [place] [phone]

## 2022-10-05 NOTE — Evaluation (Signed)
Physical Therapy Evaluation Patient Details Name: Victoria Gutierrez MRN: [id number] DOB: 08/14/1945 Today's Date: 10/05/2022  History of Present Illness  presented to ER secondary to progressive wekaness, urinary frequency and AMS; admitted for management of UTI.  Clinical Impression  Patient resting in bed upon arrival to room; supportive daughter at bedside.  Patient alert and oriented to self, location and general situation; follows simple commands, but does require increased time for processing and task initiation.  Denies pain.  Bilat UE/LE generally weak and deconditioned; no gross focal weakness appreciated.  Per daughter, does have some intermittent 'dragging' of L LE with fatigue at baseline (does not wear brace).  Currently able to complete bed mobility with min assist; maintains unsupported sitting balance with close sup, fair ability to mobilize outside of immediate BOS without LOB.  Additional standing/OOB mobility deferred secondary to elevated BP; per daughter, RN to obtain med.  Will continue to assess/progress mobility in subsequent sessions as medically appropriate. Would benefit from skilled PT to address above deficits and promote optimal return to PLOF; recommend post-acute PT per physician's recommendations.       Recommendations for follow up therapy are one component of a multi-disciplinary discharge planning process, led by the attending physician.  Recommendations may be updated based on patient status, additional functional criteria and insurance authorization.  Follow Up Recommendations       Assistance Recommended at Discharge Frequent or constant Supervision/Assistance  Patient can return home with the following  A little help with bathing/dressing/bathroom;A little help with walking and/or transfers    Equipment Recommendations    Recommendations for Other Services       Functional Status Assessment Patient has had a recent decline in their functional status and  demonstrates the ability to make significant improvements in function in a reasonable and predictable amount of time.     Precautions / Restrictions Precautions Precautions: Fall Restrictions Weight Bearing Restrictions: No      Mobility  Bed Mobility Overal bed mobility: Needs Assistance Bed Mobility: Supine to Sit, Sit to Supine     Supine to sit: Min guard Sit to supine: Min assist, Mod assist   General bed mobility comments: for bilat LE elevation into bed    Transfers                   General transfer comment: deferred secondary to elevated BP (200s/100s)    Ambulation/Gait               General Gait Details: deferred secondary to elevated BP (200s/100s)  Stairs            Wheelchair Mobility    Modified Rankin (Stroke Patients Only)       Balance Overall balance assessment: Needs assistance Sitting-balance support: Feet supported Sitting balance-Leahy Scale: Good                                       Pertinent Vitals/Pain Pain Assessment Pain Assessment: No/denies pain    Home Living Family/patient expects to be discharged to:: Private residence   Available Help at Discharge: Family;Available 24 hours/day Type of Home: House Home Access: Stairs to enter Entrance Stairs-Rails: Left;Right;Can reach both Entrance Stairs-Number of Steps: 5   Home Layout: One level Home Equipment: Rollator (4 wheels) Additional Comments: Hired caregiver during the day; daughter home at night    Prior Function Prior Level of Function :  Needs assist             Mobility Comments: Sup/mod indep for household mobilization with 4WRW; does endorse multiple fall history (due to weakness), "too many to count" ADLs Comments: Assist from daughter for ADLs, household chores, meals and meds     Hand Dominance   Dominant Hand: Right    Extremity/Trunk Assessment   Upper Extremity Assessment Upper Extremity Assessment:  Generalized weakness (grossly 4-/5 throughout)    Lower Extremity Assessment Lower Extremity Assessment: Generalized weakness (grossly 4-/5 throughout)       Communication   Communication: HOH  Cognition Arousal/Alertness: Awake/alert Behavior During Therapy: Flat affect                                   General Comments: Oriented to self, locatin, general situation; follows simple commands        General Comments      Exercises     Assessment/Plan    PT Assessment Patient needs continued PT services  PT Problem List Decreased strength;Decreased activity tolerance;Decreased balance;Decreased mobility;Decreased cognition;Decreased knowledge of use of DME;Decreased coordination;Decreased safety awareness;Decreased knowledge of precautions       PT Treatment Interventions DME instruction;Gait training;Stair training;Therapeutic exercise;Therapeutic activities;Functional mobility training;Balance training;Neuromuscular re-education;Cognitive remediation;Patient/family education    PT Goals (Current goals can be found in the Care Plan section)  Acute Rehab PT Goals Patient Stated Goal: to get her strength back PT Goal Formulation: With patient/family Time For Goal Achievement: 10/19/22 Potential to Achieve Goals: Fair    Frequency Min 3X/week     Co-evaluation               AM-PAC PT "6 Clicks" Mobility  Outcome Measure Help needed turning from your back to your side while in a flat bed without using bedrails?: None Help needed moving from lying on your back to sitting on the side of a flat bed without using bedrails?: A Little Help needed moving to and from a bed to a chair (including a wheelchair)?: A Little Help needed standing up from a chair using your arms (e.g., wheelchair or bedside chair)?: A Little   Help needed climbing 3-5 steps with a railing? : A Lot 6 Click Score: 15    End of Session   Activity Tolerance: Treatment limited  secondary to medical complications (Comment) Patient left: with call bell/phone within reach;with family/visitor present;in bed   PT Visit Diagnosis: Difficulty in walking, not elsewhere classified (R26.2);Muscle weakness (generalized) (M62.81)    Time: 3112-1624 PT Time Calculation (min) (ACUTE ONLY): 26 min   Charges:   PT Evaluation $PT Eval Moderate Complexity: 1 Mod          Lallie Strahm H. Manson Passey, PT, DPT, NCS 10/05/22, 11:17 PM [phone]

## 2022-10-05 NOTE — Care Management Obs Status (Signed)
MEDICARE OBSERVATION STATUS NOTIFICATION   Patient Details  Name: Victoria Gutierrez MRN: [id number] Date of Birth: 1946-04-27   Medicare Observation Status Notification Given:  Yes    Darolyn Rua, LCSW 10/05/2022, 11:45 AM

## 2022-10-05 NOTE — Discharge Summary (Signed)
Physician Discharge Summary   Patient: Victoria GraysSherry R Flippo MRN: [id number] DOB: 11-12-1945  Admit date:     10/04/2022  Discharge date: 10/05/22  Discharge Physician: Arnetha CourserSumayya Shamariah Shewmake   PCP: Jerl MinaHedrick, James, MD   Recommendations at discharge:  Please obtain CBC and BMP within next few days Follow-up with primary care provider within a week Patient is being discharged on 5 days of Keflex for UTI-please follow-up final urine culture results and ensure the completion of antibiotics.  Discharge Diagnoses: Principal Problem:   UTI (urinary tract infection) Active Problems:   Fall at home, initial encounter   Acute metabolic encephalopathy   Acute renal failure superimposed on stage 3a chronic kidney disease   Hyperlipidemia   Myocardial injury   Hypokalemia   Hypertension   History of CVA (cerebrovascular accident)   Chronic diastolic CHF (congestive heart failure)   Tobacco abuse   Altered mental status   Hospital Course: Taken from H&P.  Victoria GraysSherry R Kendrick is a 77 y.o. female with medical history significant of hypertension, hyperlipidemia, diastolic CHF, stroke, depression, CKD-3, hard of hearing, psoriasis, who presents with weakness, altered mental status, increased urinary frequency.    At her normal baseline, patient is oriented x 3.  This morning patient is disorientated to place and time.  Her mental status has improved in the ED after giving IV fluid treatment.    Per her daughter, patient had mild fall this morning. Patient slowly lowered her body to ground without any injury.   Patient was found to have hypotension initially with blood pressure 88/64, which improved to 120/82 after giving 1 L normal saline in the ED.  Data reviewed independently and ED Course: pt was found to have WBC 17.1, lactic acid 2.5, positive urinalysis (hazy appearance, small amount of leukocyte, rare bacteria, WBC 21-50), potassium 2.5, magnesium 2.1, phosphorus 3.0, AKI with creatinine 1.55, BUN 18, GFR 35  (recent baseline creatinine 0.98 on 05/28/2022), negative PCR for COVID, flu and RSV.  Chest x-ray negative.  CT head negative.  MRI for brain is negative for acute intracranial issues, but showed remote right basalr ganglia infarction.  EKG:  Sinus rhythm, QTc 513, bifascicular block, early R wave progression.  Blood and urine cultures were ordered and she was started on Rocephin.  4/9: Vitals normal.  Leukocytosis resolved.  Persistent hypokalemia with potassium at 2.8, which is being repleted.  Magnesium was normal.  AKI resolved.  Procalcitonin negative.  Preliminary blood cultures negative, urine cultures pending.  Mildly elevated troponin with a downward trend. Patient had a diarrhea the day before which most likely caused excessive dehydration.  Diarrhea has been resolved.  Appetite back and she is eating good now.  Discussed with patient and with daughter on phone.  Patient would like to go back home so she is being discharged on Keflex with pending final urine culture results.  Restarted home antihypertensives as blood pressure started trending up.  Patient will continue on her current medications and need to have a close follow-up with her primary care provider for further recommendations.   Consultants: None Procedures performed: None Disposition: Home Diet recommendation:  Discharge Diet Orders (From admission, onward)     Start     Ordered   10/05/22 0000  Diet - low sodium heart healthy        10/05/22 1201           Cardiac diet DISCHARGE MEDICATION: Allergies as of 10/05/2022   No Known Allergies      Medication  List     STOP taking these medications    potassium chloride 10 MEQ tablet Commonly known as: KLOR-CON M       TAKE these medications    amLODipine 10 MG tablet Commonly known as: NORVASC Take 1 tablet (10 mg total) by mouth daily.   aspirin EC 325 MG tablet Take 325 mg by mouth daily.   atorvastatin 80 MG tablet Commonly known as:  LIPITOR Take 1 tablet (80 mg total) by mouth daily at 6 PM.   cephALEXin 500 MG capsule Commonly known as: KEFLEX Take 1 capsule (500 mg total) by mouth 3 (three) times daily for 5 days.   clopidogrel 75 MG tablet Commonly known as: PLAVIX Take 1 tablet (75 mg total) by mouth daily.   clotrimazole-betamethasone cream Commonly known as: LOTRISONE Apply 1 Application topically 2 (two) times daily.   lisinopril 20 MG tablet Commonly known as: ZESTRIL Take 20 mg by mouth daily.   Melatonin 5 MG Tbdp Take 5 mg by mouth at bedtime.   mupirocin ointment 2 % Commonly known as: BACTROBAN Apply 1 Application topically 3 (three) times daily.        Follow-up Information     Jerl Mina, MD. Schedule an appointment as soon as possible for a visit in 1 week(s).   Specialty: Family Medicine Contact information: [address] [place] [address] [phone]                Discharge Exam: Ceasar Mons Weights   10/04/22 1200  Weight: 72.6 kg   General.  Well-developed elderly lady, in no acute distress. Pulmonary.  Lungs clear bilaterally, normal respiratory effort. CV.  Regular rate and rhythm, no JVD, rub or murmur. Abdomen.  Soft, nontender, nondistended, BS positive. CNS.  Alert and oriented .  No focal neurologic deficit. Extremities.  No edema, no cyanosis, pulses intact and symmetrical. Psychiatry.  Judgment and insight appears normal.   Condition at discharge: stable  The results of significant diagnostics from this hospitalization (including imaging, microbiology, ancillary and laboratory) are listed below for reference.   Imaging Studies: MR BRAIN WO CONTRAST  Result Date: 10/04/2022 CLINICAL DATA:  Mental status change, unknown cause EXAM: MRI HEAD WITHOUT CONTRAST TECHNIQUE: Multiplanar, multiecho pulse sequences of the brain and surrounding structures were obtained without intravenous contrast. COMPARISON:  MRI head 05/28/2022.  FINDINGS: Brain: Prior right basal ganglia infarct with evidence of some hemorrhage in this region and associated encephalomalacia/gliosis. No evidence of acute infarct, acute hemorrhage, mass lesion or midline shift. Small remote right cerebellar infarct. Scattered T2/FLAIR hyperintensity in the white matter, nonspecific but compatible with chronic microvascular ischemic disease. Cerebral atrophy. Vascular: Major arterial flow voids are maintained. Skull and upper cervical spine: Normal marrow signal. Sinuses/Orbits: Left maxillary sinus retention cyst. No acute orbital finding. Other: No mastoid effusions. IMPRESSION: 1. No evidence of acute abnormality. 2. Remote right basal ganglia infarct and chronic microvascular ischemic change. Electronically Signed   By: Feliberto Harts M.D.   On: 10/04/2022 12:40   DG Chest Portable 1 View  Result Date: 10/04/2022 CLINICAL DATA:  Confusion, weakness EXAM: PORTABLE CHEST 1 VIEW COMPARISON:  05/27/2022 FINDINGS: Transverse diameter of heart is increased. There are no signs of pulmonary edema or new focal pulmonary consolidation. Crowding of markings in the medial right lower lung field has not changed significantly. There is no pleural effusion or pneumothorax. IMPRESSION: There are no new infiltrates or signs of pulmonary edema. Electronically Signed   By: Rhae Hammock  Rathinasamy M.D.   On: 10/04/2022 11:18   CT HEAD WO CONTRAST ( )  Result Date: 10/04/2022 CLINICAL DATA:  Mental status change, unknown cause EXAM: CT HEAD WITHOUT CONTRAST TECHNIQUE: Contiguous axial images were obtained from the base of the skull through the vertex without intravenous contrast. RADIATION DOSE REDUCTION: This exam was performed according to the departmental dose-optimization program which includes automated exposure control, adjustment of the mA and/or kV according to patient size and/or use of iterative reconstruction technique. COMPARISON:  CT head November 30, 23. FINDINGS: Brain:  Remote perforator infarct in the right basal ganglia. Patchy white matter hypodensities, nonspecific but compatible with chronic microvascular ischemic disease. No evidence of acute large vascular territory infarct, acute hemorrhage, mass lesion, or midline shift. Vascular: Calcific atherosclerosis.  No hyperdense vessel. Skull: No acute fracture. Sinuses/Orbits: Left maxillary sinus retention cyst. No acute orbital findings. Other: No mastoid effusions. IMPRESSION: No evidence of acute intracranial abnormality. Electronically Signed   By: Feliberto Harts M.D.   On: 10/04/2022 10:42    Microbiology: Results for orders placed or performed during the hospital encounter of 10/04/22  Resp panel by RT-PCR (RSV, Flu A&B, Covid) Anterior Nasal Swab     Status: None   Collection Time: 10/04/22  9:37 AM   Specimen: Anterior Nasal Swab  Result Value Ref Range Status   SARS Coronavirus 2 by RT PCR NEGATIVE NEGATIVE Final    Comment: (NOTE) SARS-CoV-2 target nucleic acids are NOT DETECTED.  The SARS-CoV-2 RNA is generally detectable in upper respiratory specimens during the acute phase of infection. The lowest concentration of SARS-CoV-2 viral copies this assay can detect is 138 copies/mL. A negative result does not preclude SARS-Cov-2 infection and should not be used as the sole basis for treatment or other patient management decisions. A negative result may occur with  improper specimen collection/handling, submission of specimen other than nasopharyngeal swab, presence of viral mutation(s) within the areas targeted by this assay, and inadequate number of viral copies(<138 copies/mL). A negative result must be combined with clinical observations, patient history, and epidemiological information. The expected result is Negative.  Fact Sheet for Patients:  BloggerCourse.com  Fact Sheet for Healthcare Providers:  SeriousBroker.it  This test is no t  yet approved or cleared by the [place] FDA and  has been authorized for detection and/or diagnosis of SARS-CoV-2 by FDA under an Emergency Use Authorization (EUA). This EUA will remain  in effect (meaning this test can be used) for the duration of the COVID-19 declaration under Section 564(b)(1) of the Act, 21 U.S.C.section 360bbb-3(b)(1), unless the authorization is terminated  or revoked sooner.       Influenza A by PCR NEGATIVE NEGATIVE Final   Influenza B by PCR NEGATIVE NEGATIVE Final    Comment: (NOTE) The Xpert Xpress SARS-CoV-2/FLU/RSV plus assay is intended as an aid in the diagnosis of influenza from Nasopharyngeal swab specimens and should not be used as a sole basis for treatment. Nasal washings and aspirates are unacceptable for Xpert Xpress SARS-CoV-2/FLU/RSV testing.  Fact Sheet for Patients: BloggerCourse.com  Fact Sheet for Healthcare Providers: SeriousBroker.it  This test is not yet approved or cleared by the [place] FDA and has been authorized for detection and/or diagnosis of SARS-CoV-2 by FDA under an Emergency Use Authorization (EUA). This EUA will remain in effect (meaning this test can be used) for the duration of the COVID-19 declaration under Section 564(b)(1) of the Act, 21 U.S.C. section 360bbb-3(b)(1), unless the authorization is terminated or revoked.  Resp Syncytial Virus by PCR NEGATIVE NEGATIVE Final    Comment: (NOTE) Fact Sheet for Patients: BloggerCourse.com  Fact Sheet for Healthcare Providers: SeriousBroker.it  This test is not yet approved or cleared by the [place] FDA and has been authorized for detection and/or diagnosis of SARS-CoV-2 by FDA under an Emergency Use Authorization (EUA). This EUA will remain in effect (meaning this test can be used) for the duration of the COVID-19 declaration under Section 564(b)(1)  of the Act, 21 U.S.C. section 360bbb-3(b)(1), unless the authorization is terminated or revoked.  Performed at [facility], [address] [place], [place] [address]   Culture, blood (Routine X 2) w Reflex to ID Panel     Status: None (Preliminary result)   Collection Time: 10/04/22  1:41 PM   Specimen: BLOOD  Result Value Ref Range Status   Specimen Description BLOOD RIGHT ANTECUBITAL  Final   Special Requests   Final    BOTTLES DRAWN AEROBIC AND ANAEROBIC Blood Culture adequate volume   Culture   Final    NO GROWTH < 24 HOURS Performed at [facility], [address] [place], [place] [address]    Report Status PENDING  Incomplete  Culture, blood (Routine X 2) w Reflex to ID Panel     Status: None (Preliminary result)   Collection Time: 10/04/22  1:42 PM   Specimen: BLOOD RIGHT FOREARM  Result Value Ref Range Status   Specimen Description BLOOD RIGHT FOREARM  Final   Special Requests   Final    BOTTLES DRAWN AEROBIC AND ANAEROBIC Blood Culture adequate volume   Culture   Final    NO GROWTH < 24 HOURS Performed at [facility], [address] [place], [place] [address]    Report Status PENDING  Incomplete    Labs: CBC: Recent Labs  Lab 10/04/22 0937 10/05/22 0430  WBC 17.1* 8.8  HGB 14.9 13.3  HCT 44.0 41.3  MCV 94.4 99.8  PLT 281 218   Basic Metabolic Panel: Recent Labs  Lab 10/04/22 0937 10/05/22 0430  NA 138 140  K 2.5* 2.8*  CL 99 109  CO2 28 22  GLUCOSE 129* 93  BUN 18 12  CREATININE 1.55* 0.94  CALCIUM 9.0 7.9*  MG 2.1  --   PHOS 3.0  --    Liver Function Tests: Recent Labs  Lab 10/04/22 0937  AST 25  ALT 15  ALKPHOS 63  BILITOT 1.0  PROT 6.1*  ALBUMIN 3.7   CBG: No results for input(s): "GLUCAP" in the last 168 hours.  Discharge time spent: greater than 30 minutes.  This record has been created using Conservation officer, historic buildings. Errors have been sought and corrected,but may not always be  located. Such creation errors do not reflect on the standard of care.   Signed: Arnetha Courser, MD [facility] 10/05/2022

## 2022-10-05 NOTE — Hospital Course (Addendum)
Taken from H&P.  Victoria Gutierrez is a 77 y.o. female with medical history significant of hypertension, hyperlipidemia, diastolic CHF, stroke, depression, CKD-3, hard of hearing, psoriasis, who presents with weakness, altered mental status, increased urinary frequency.    At her normal baseline, patient is oriented x 3.  This morning patient is disorientated to place and time.  Her mental status has improved in the ED after giving IV fluid treatment.    Per her daughter, patient had mild fall this morning. Patient slowly lowered her body to ground without any injury.   Patient was found to have hypotension initially with blood pressure 88/64, which improved to 120/82 after giving 1 L normal saline in the ED.  Data reviewed independently and ED Course: pt was found to have WBC 17.1, lactic acid 2.5, positive urinalysis (hazy appearance, small amount of leukocyte, rare bacteria, WBC 21-50), potassium 2.5, magnesium 2.1, phosphorus 3.0, AKI with creatinine 1.55, BUN 18, GFR 35 (recent baseline creatinine 0.98 on 05/28/2022), negative PCR for COVID, flu and RSV.  Chest x-ray negative.  CT head negative.  MRI for brain is negative for acute intracranial issues, but showed remote right basalr ganglia infarction.  EKG:  Sinus rhythm, QTc 513, bifascicular block, early R wave progression.  Blood and urine cultures were ordered and she was started on Rocephin.  4/9: Vitals normal.  Leukocytosis resolved.  Persistent hypokalemia with potassium at 2.8, which is being repleted.  Magnesium was normal.  AKI resolved.  Procalcitonin negative.  Preliminary blood cultures negative, urine cultures pending.  Mildly elevated troponin with a downward trend. Patient had a diarrhea the day before which most likely caused excessive dehydration.  Diarrhea has been resolved.  Appetite back and she is eating good now.  Discussed with patient and with daughter on phone.  Patient would like to go back home so she is being  discharged on Keflex with pending final urine culture results.  Restarted home antihypertensives as blood pressure started trending up.  Patient will continue on her current medications and need to have a close follow-up with her primary care provider for further recommendations.

## 2022-10-05 NOTE — Care Management CC44 (Signed)
Condition Code 44 Documentation Completed  Patient Details  Name: NECIA JUBB MRN: [id number] Date of Birth: Aug 12, 1945   Condition Code 44 given:  Yes Patient signature on Condition Code 44 notice:  Yes Documentation of 2 MD's agreement:  Yes Code 44 added to claim:  Yes    Darolyn Rua, LCSW 10/05/2022, 11:45 AM

## 2022-10-07 LAB — CULTURE, BLOOD (ROUTINE X 2): Culture: NO GROWTH

## 2022-10-08 LAB — CULTURE, BLOOD (ROUTINE X 2)

## 2022-10-09 LAB — CULTURE, BLOOD (ROUTINE X 2)
Culture: NO GROWTH
Special Requests: ADEQUATE
Special Requests: ADEQUATE

## 2022-10-13 DIAGNOSIS — M25551 Pain in right hip: Secondary | ICD-10-CM | POA: Diagnosis not present

## 2022-10-13 DIAGNOSIS — R262 Difficulty in walking, not elsewhere classified: Secondary | ICD-10-CM | POA: Diagnosis not present

## 2022-10-13 DIAGNOSIS — M6281 Muscle weakness (generalized): Secondary | ICD-10-CM | POA: Diagnosis not present

## 2022-10-18 ENCOUNTER — Observation Stay
Admission: EM | Admit: 2022-10-18 | Discharge: 2022-10-20 | Disposition: A | Discharge status: Home Health | Payer: Medicare HMO | Attending: Student | Admitting: Student

## 2022-10-18 ENCOUNTER — Emergency Department: Payer: Medicare HMO

## 2022-10-18 ENCOUNTER — Observation Stay: Payer: Medicare HMO

## 2022-10-18 ENCOUNTER — Other Ambulatory Visit: Payer: Self-pay

## 2022-10-18 ENCOUNTER — Encounter: Payer: Self-pay | Admitting: Emergency Medicine

## 2022-10-18 ENCOUNTER — Other Ambulatory Visit: Payer: Medicare HMO

## 2022-10-18 DIAGNOSIS — R471 Dysarthria and anarthria: Secondary | ICD-10-CM | POA: Insufficient documentation

## 2022-10-18 DIAGNOSIS — S72011A Unspecified intracapsular fracture of right femur, initial encounter for closed fracture: Secondary | ICD-10-CM | POA: Diagnosis not present

## 2022-10-18 DIAGNOSIS — Z79899 Other long term (current) drug therapy: Secondary | ICD-10-CM | POA: Diagnosis not present

## 2022-10-18 DIAGNOSIS — I5032 Chronic diastolic (congestive) heart failure: Secondary | ICD-10-CM | POA: Diagnosis not present

## 2022-10-18 DIAGNOSIS — N183 Chronic kidney disease, stage 3 unspecified: Secondary | ICD-10-CM | POA: Insufficient documentation

## 2022-10-18 DIAGNOSIS — S0990XA Unspecified injury of head, initial encounter: Secondary | ICD-10-CM | POA: Diagnosis not present

## 2022-10-18 DIAGNOSIS — R4182 Altered mental status, unspecified: Secondary | ICD-10-CM | POA: Diagnosis not present

## 2022-10-18 DIAGNOSIS — G9341 Metabolic encephalopathy: Secondary | ICD-10-CM | POA: Diagnosis present

## 2022-10-18 DIAGNOSIS — S72001A Fracture of unspecified part of neck of right femur, initial encounter for closed fracture: Secondary | ICD-10-CM | POA: Diagnosis not present

## 2022-10-18 DIAGNOSIS — Z7982 Long term (current) use of aspirin: Secondary | ICD-10-CM | POA: Diagnosis not present

## 2022-10-18 DIAGNOSIS — Z7902 Long term (current) use of antithrombotics/antiplatelets: Secondary | ICD-10-CM | POA: Insufficient documentation

## 2022-10-18 DIAGNOSIS — R531 Weakness: Secondary | ICD-10-CM

## 2022-10-18 DIAGNOSIS — M25551 Pain in right hip: Secondary | ICD-10-CM | POA: Diagnosis not present

## 2022-10-18 DIAGNOSIS — Z8673 Personal history of transient ischemic attack (TIA), and cerebral infarction without residual deficits: Secondary | ICD-10-CM | POA: Insufficient documentation

## 2022-10-18 DIAGNOSIS — I10 Essential (primary) hypertension: Secondary | ICD-10-CM | POA: Diagnosis present

## 2022-10-18 DIAGNOSIS — Z1152 Encounter for screening for COVID-19: Secondary | ICD-10-CM | POA: Insufficient documentation

## 2022-10-18 DIAGNOSIS — I959 Hypotension, unspecified: Secondary | ICD-10-CM | POA: Diagnosis not present

## 2022-10-18 DIAGNOSIS — R001 Bradycardia, unspecified: Secondary | ICD-10-CM | POA: Diagnosis not present

## 2022-10-18 DIAGNOSIS — F17210 Nicotine dependence, cigarettes, uncomplicated: Secondary | ICD-10-CM | POA: Diagnosis not present

## 2022-10-18 DIAGNOSIS — G9340 Encephalopathy, unspecified: Secondary | ICD-10-CM

## 2022-10-18 DIAGNOSIS — N179 Acute kidney failure, unspecified: Secondary | ICD-10-CM | POA: Diagnosis not present

## 2022-10-18 DIAGNOSIS — I130 Hypertensive heart and chronic kidney disease with heart failure and stage 1 through stage 4 chronic kidney disease, or unspecified chronic kidney disease: Secondary | ICD-10-CM | POA: Diagnosis not present

## 2022-10-18 DIAGNOSIS — I639 Cerebral infarction, unspecified: Secondary | ICD-10-CM | POA: Diagnosis not present

## 2022-10-18 DIAGNOSIS — Z96642 Presence of left artificial hip joint: Secondary | ICD-10-CM | POA: Diagnosis not present

## 2022-10-18 DIAGNOSIS — I679 Cerebrovascular disease, unspecified: Secondary | ICD-10-CM

## 2022-10-18 DIAGNOSIS — K5730 Diverticulosis of large intestine without perforation or abscess without bleeding: Secondary | ICD-10-CM | POA: Diagnosis not present

## 2022-10-18 LAB — RESP PANEL BY RT-PCR (FLU A&B, COVID) ARPGX2
Influenza A by PCR: NEGATIVE
Influenza B by PCR: NEGATIVE
SARS Coronavirus 2 by RT PCR: NEGATIVE

## 2022-10-18 LAB — T4, FREE: Free T4: 0.94 ng/dL (ref 0.61–1.12)

## 2022-10-18 LAB — BASIC METABOLIC PANEL
Anion gap: 11 (ref 5–15)
BUN: 21 mg/dL (ref 8–23)
CO2: 28 mmol/L (ref 22–32)
Calcium: 9.5 mg/dL (ref 8.9–10.3)
Chloride: 98 mmol/L (ref 98–111)
Creatinine, Ser: 1.91 mg/dL — ABNORMAL HIGH (ref 0.44–1.00)
GFR, Estimated: 27 mL/min — ABNORMAL LOW (ref >60)
Glucose, Bld: 109 mg/dL — ABNORMAL HIGH (ref 70–99)
Potassium: 2.9 mmol/L — ABNORMAL LOW (ref 3.5–5.1)
Sodium: 137 mmol/L (ref 135–145)

## 2022-10-18 LAB — CBC
HCT: 46 % (ref 36.0–46.0)
Hemoglobin: 16 g/dL — ABNORMAL HIGH (ref 12.0–15.0)
MCH: 32.5 pg (ref 26.0–34.0)
MCHC: 34.8 g/dL (ref 30.0–36.0)
MCV: 93.5 fL (ref 80.0–100.0)
Platelets: 305 10*3/uL (ref 150–400)
RBC: 4.92 MIL/uL (ref 3.87–5.11)
RDW: 12 % (ref 11.5–15.5)
WBC: 14.2 10*3/uL — ABNORMAL HIGH (ref 4.0–10.5)
nRBC: 0 % (ref 0.0–0.2)

## 2022-10-18 LAB — URINALYSIS, ROUTINE W REFLEX MICROSCOPIC
Bilirubin Urine: NEGATIVE
Glucose, UA: NEGATIVE mg/dL
Hgb urine dipstick: NEGATIVE
Ketones, ur: NEGATIVE mg/dL
Leukocytes,Ua: NEGATIVE
Nitrite: NEGATIVE
Protein, ur: 100 mg/dL — AB
Specific Gravity, Urine: 1.011 (ref 1.005–1.030)
pH: 6 (ref 5.0–8.0)

## 2022-10-18 LAB — TSH: TSH: 6.271 u[IU]/mL — ABNORMAL HIGH (ref 0.350–4.500)

## 2022-10-18 LAB — MAGNESIUM: Magnesium: 2.4 mg/dL (ref 1.7–2.4)

## 2022-10-18 LAB — PROCALCITONIN: Procalcitonin: <0.1 ng/mL

## 2022-10-18 MED ORDER — POLYETHYLENE GLYCOL 3350 17 G PO PACK: 17.0000 g | PACK | Freq: Every day | ORAL | Status: DC | PRN

## 2022-10-18 MED ORDER — LACTATED RINGERS IV SOLN: INTRAVENOUS | Status: AC

## 2022-10-18 MED ORDER — STROKE: EARLY STAGES OF RECOVERY BOOK: Freq: Once | Status: AC

## 2022-10-18 MED ORDER — POTASSIUM CHLORIDE 10 MEQ/100ML IV SOLN
10.0000 meq | INTRAVENOUS | Status: AC
  Administered 2022-10-18 – 2022-10-19 (×4): 10 meq via INTRAVENOUS
  Filled 2022-10-18 (×4): qty 100

## 2022-10-18 MED ORDER — ATORVASTATIN CALCIUM 20 MG PO TABS
80.0000 mg | ORAL_TABLET | Freq: Every day | ORAL | Status: DC
  Administered 2022-10-19: 80 mg via ORAL
  Filled 2022-10-18: qty 4

## 2022-10-18 MED ORDER — ACETAMINOPHEN 650 MG RE SUPP: 650.0000 mg | Freq: Four times a day (QID) | RECTAL | Status: DC | PRN

## 2022-10-18 MED ORDER — ASPIRIN 81 MG PO TBEC
81.0000 mg | DELAYED_RELEASE_TABLET | Freq: Every day | ORAL | Status: DC
  Administered 2022-10-19 – 2022-10-20 (×2): 81 mg via ORAL
  Filled 2022-10-18 (×2): qty 1

## 2022-10-18 MED ORDER — SODIUM CHLORIDE 0.9 % IV BOLUS
500.0000 mL | Freq: Once | INTRAVENOUS | Status: AC
  Administered 2022-10-18: 500 mL via INTRAVENOUS

## 2022-10-18 MED ORDER — ACETAMINOPHEN 325 MG PO TABS: 650.0000 mg | ORAL_TABLET | Freq: Four times a day (QID) | ORAL | Status: DC | PRN

## 2022-10-18 MED ORDER — ENOXAPARIN SODIUM 30 MG/0.3ML IJ SOSY
30.0000 mg | PREFILLED_SYRINGE | INTRAMUSCULAR | Status: DC
  Administered 2022-10-19 (×2): 30 mg via SUBCUTANEOUS
  Filled 2022-10-18 (×2): qty 0.3

## 2022-10-18 MED ORDER — CLOPIDOGREL BISULFATE 75 MG PO TABS
75.0000 mg | ORAL_TABLET | Freq: Every day | ORAL | Status: DC
  Administered 2022-10-19 – 2022-10-20 (×2): 75 mg via ORAL
  Filled 2022-10-18 (×2): qty 1

## 2022-10-18 MED ORDER — ONDANSETRON HCL 4 MG/2ML IJ SOLN: 4.0000 mg | Freq: Four times a day (QID) | INTRAMUSCULAR | Status: DC | PRN

## 2022-10-18 MED ORDER — ONDANSETRON HCL 4 MG PO TABS: 4.0000 mg | ORAL_TABLET | Freq: Four times a day (QID) | ORAL | Status: DC | PRN

## 2022-10-18 MED ORDER — SODIUM CHLORIDE 0.9% FLUSH
3.0000 mL | Freq: Two times a day (BID) | INTRAVENOUS | Status: DC
  Administered 2022-10-19 – 2022-10-20 (×4): 3 mL via INTRAVENOUS

## 2022-10-18 NOTE — Assessment & Plan Note (Signed)
Patient had an episode of hypotension this morning after bearing down to use the restroom most consistent with vasovagal.  Given AKI, will hold lisinopril.  - Holding home antihypertensives at this time

## 2022-10-18 NOTE — Assessment & Plan Note (Addendum)
Patient is presenting with elevated creatinine at 1.9 with BUN of 21. CT renal stone study was notable for interval development of marked left renal atrophy compared to 2019 concerning for left renal artery stenosis.  Uncertain if this is playing a role at this time.  Patient's daughter states water intake has been adequate.   - Renal duplex ordered - Gentle maintenance fluids overnight - Repeat BMP in the a.m. - Avoid nephrotoxic agents including home lisinopril - If no improvement, could consider nephrology consultation

## 2022-10-18 NOTE — ED Triage Notes (Signed)
Please see first nurse note. Patient only alert to self. Unable to answer questions from this RN. Patient moving both arms but unable to move both legs. Hx of strokes with deficients on left side.

## 2022-10-18 NOTE — ED Notes (Signed)
Patient is in MRI at this time

## 2022-10-18 NOTE — Assessment & Plan Note (Addendum)
Per patient's daughter, patient is not currently at baseline.  On exam, it is notable that patient's affect is quite flat and she seems dazed.  She is having some difficulty following instructions during exam.  Etiology uncertain at this time.  She has a history of previous CVA with last MRI demonstrating cerebral atrophy.  Despite AKI, BUN is within normal limits.  TSH is within normal limits for age.  Urinalysis demonstrated some bacteria, however a lot of squamous epithelial cells present and she denies any urinary symptoms.  - MRI brain given dysarthria and left-sided facial droop this a.m. - Vitamin B12 - Consider neurology consultation if no improvement.

## 2022-10-18 NOTE — ED Notes (Signed)
Patient to MRI at this time.

## 2022-10-18 NOTE — Assessment & Plan Note (Signed)
Per patient's daughter, patient had a hard fall in December 2023 and took her several months to recover.  She continues to have right-sided hip pain at this time.  CT renal stone study noted abnormal appearance of the subcapital region of the right femoral neck concerning for possible fracture.  - Dedicated right hip CT ordered

## 2022-10-18 NOTE — H&P (Addendum)
History and Physical    Patient: Victoria Gutierrez:[id number] DOB: 1945/09/10 DOA: 10/18/2022 DOS: the patient was seen and examined on 10/18/2022 PCP: Jerl Mina, MD  Patient coming from: Home  Chief Complaint:  Chief Complaint  Patient presents with   Weakness   HPI: Victoria Gutierrez is a 77 y.o. female with medical history significant of hypertension, hyperlipidemia, HFpEF, CVA, CKD stage III, plaque psoriasis, hard of hearing, who presents to the ED due to increased generalized weakness.  History predominantly obtained from patient's daughter at bedside, [name].  [name] states that for the last 2+ weeks, Victoria Gutierrez has seemed not quite herself.  She has been experiencing increasing generalized weakness and has seemed more dazed.  She came in on 4/8 at which time she was admitted for UTI, hypotension and possible TIA.  Since she has been at home, patient has continued to have episodes of generalized weakness with the most recent episode today.  After [name] was helped to the bathroom by the home health aide, she was unable to stand up on her own.  Her blood pressure was taken at that time and it was noted to be extremely low with systolics in the 70s.  At that time, her speech also seemed dysarthric and her daughter noted left-sided facial droop.  She notes that facial droop has been chronic since previous stroke but seems worse at this time.  [name] denies noting any recent fever, chills, chest pain, shortness of breath, acute cough, nausea, vomiting, abdominal pain or urinary symptoms.  Patient has had right hip pain chronically since a big fall in December 2023.  Ms. [name] states did have a fall that she describes as her crumbling down to the ground after not having a good grip on the bed when trying to get up.  Patient denies any acute pain after this fall.  ED course: On arrival to the ED, patient was normotensive at 115/79 with heart rate of 88.  She was saturating at 97% on room  air.  She was afebrile at 97.9.  Initial blood work notable for WBC of 14.2, hemoglobin 16.0, sodium of 137, potassium 2.9, bicarb 28, glucose 109, creatinine 0.91, BUN 21 with GFR of 27.  Urinalysis was obtained that had increased squamous epithelial and rare bacteria.  COVID-19 and influenza negative.  TSH minimally elevated and appropriate for age.  CT head was obtained with no acute intracranial abnormalities.  CT renal stone study was obtained that demonstrated Interval development of marked left renal atrophy compared to 2019 concerning for renal artery stenosis.  In addition, there was abnormal appearance of the subcapital region of the right femoral neck.  Patient started on IV fluids and [facility] contacted for admission.  Review of Systems: As mentioned in the history of present illness. All other systems reviewed and are negative.  Past Medical History:  Diagnosis Date   Arthritis    knees, hands   H/O thyroid disease    No issues currently   HOH (hard of hearing)    Hypertension    Stroke 12/2018   Wears hearing aid in both ears    has, does not wear   Past Surgical History:  Procedure Laterality Date   ABDOMINAL HYSTERECTOMY     CATARACT EXTRACTION W/PHACO Right 09/18/2019   Procedure: CATARACT EXTRACTION PHACO AND INTRAOCULAR LENS PLACEMENT (IOC) RIGHT 11.65  01:03.3;  Surgeon: Galen Manila, MD;  Location: [facility];  Service: Ophthalmology;  Laterality: Right;   CATARACT EXTRACTION  W/PHACO Left 10/09/2019   Procedure: CATARACT EXTRACTION PHACO AND INTRAOCULAR LENS PLACEMENT (IOC) LEFT 10.30  00:48.5;  Surgeon: Galen Manila, MD;  Location: [facility];  Service: Ophthalmology;  Laterality: Left;   TOTAL HIP ARTHROPLASTY Left 04/05/2019   Procedure: TOTAL HIP ARTHROPLASTY ANTERIOR APPROACH;  Surgeon: Kennedy Bucker, MD;  Location: [facility];  Service: Orthopedics;  Laterality: Left;   Social History:  reports that she has been smoking cigarettes. She started  smoking about 35 years ago. She has a 19.00 pack-year smoking history. She has never used smokeless tobacco. She reports that she does not drink alcohol and does not use drugs.  No Known Allergies  Family History  Problem Relation Age of Onset   Heart disease Mother    Heart disease Father    Breast cancer Neg Hx     Prior to Admission medications   Medication Sig Start Date End Date Taking? Authorizing Provider  amLODipine (NORVASC) 10 MG tablet Take 1 tablet (10 mg total) by mouth daily. 03/01/22   Lonia Blood, MD  aspirin EC 325 MG tablet Take 325 mg by mouth daily.    [provider]  atorvastatin (LIPITOR) 80 MG tablet Take 1 tablet (80 mg total) by mouth daily at 6 PM. 02/28/22   Lonia Blood, MD  clopidogrel (PLAVIX) 75 MG tablet Take 1 tablet (75 mg total) by mouth daily. 03/01/22   Lonia Blood, MD  clotrimazole-betamethasone (LOTRISONE) cream Apply 1 Application topically 2 (two) times daily. 07/14/22 07/14/23  [provider]  lisinopril (ZESTRIL) 20 MG tablet Take 20 mg by mouth daily.    [provider]  Melatonin 5 MG TBDP Take 5 mg by mouth at bedtime.  Patient not taking: Reported on 05/27/2022 05/12/16   [provider]  mupirocin ointment (BACTROBAN) 2 % Apply 1 Application topically 3 (three) times daily. 09/09/22   [provider]    Physical Exam: Vitals:   10/18/22 1820 10/18/22 1900 10/18/22 1922 10/18/22 1925  BP: (!) 154/85 104/77    Pulse: 89  89   Resp: 18  20   Temp: 98.4 F (36.9 C)  99.2 F (37.3 C)   TempSrc: Oral  Oral   SpO2: 95%  98%   Weight:    72.6 kg   Physical Exam Vitals and nursing note reviewed.  Constitutional:      Appearance: She is normal weight. She is not ill-appearing.  HENT:     Head: Normocephalic and atraumatic.     Mouth/Throat:     Mouth: Mucous membranes are moist.     Pharynx: Oropharynx is clear.  Eyes:     Extraocular Movements: Extraocular movements intact.      Conjunctiva/sclera: Conjunctivae normal.     Pupils: Pupils are equal, round, and reactive to light.  Cardiovascular:     Rate and Rhythm: Normal rate and regular rhythm.     Heart sounds: No murmur heard.    No gallop.  Pulmonary:     Effort: Pulmonary effort is normal. No respiratory distress.  Abdominal:     General: Bowel sounds are normal.     Palpations: Abdomen is soft.  Musculoskeletal:     Right lower leg: No edema.     Left lower leg: No edema.  Skin:    General: Skin is warm and dry.  Neurological:     Mental Status: She is alert.     Comments:  - Patient is alert and oriented to person,  place, and somewhat the situation. When asked the year, she initially stated 59.  - Some difficulty with following instructions and requiring assistance from patient's daughter - No visual deficits - Minimal left-sided facial droop most notable with smiling.   - 5/5 strength bilateral upper extremities.  - 3-4/5 strength bilateral lower extremities - Normal finger-to-nose bilaterally - No cogwheel rigidity  Psychiatric:        Attention and Perception: Attention normal.        Mood and Affect: Affect is flat.        Speech: Speech normal.        Behavior: Behavior is slowed.        Thought Content: Thought content normal.    Data Reviewed: CBC with WBC of 14.2, hemoglobin 16.0, platelets of 305 BMP with sodium of 137, potassium 2.9, bicarb 28, glucose 109, BUN 21, creatinine 1.91, and GFR of 27 Magnesium within normal limits at 2.4 TSH minimally elevated at 6.271 with normal free T4 of 0.94 COVID-19 and influenza PCR negative Urinalysis with no hematuria, ketonuria or leukocytes, however notable for rare bacteria however this is in the context of increased squamous epithelial cells.  EKG personally.  Sinus rhythm with rate of 90.  Right bundle branch block noted.  Compared to prior EKG obtained on October 04, 2022, no changes noted.  CT Renal Stone Study  Result Date:  10/18/2022 CLINICAL DATA:  Abdominal and flank pain, acute renal insufficiency EXAM: CT ABDOMEN AND PELVIS WITHOUT CONTRAST TECHNIQUE: Multidetector CT imaging of the abdomen and pelvis was performed following the standard protocol without IV contrast. Unenhanced CT was performed per clinician order. Lack of IV contrast limits sensitivity and specificity, especially for evaluation of abdominal/pelvic solid viscera. RADIATION DOSE REDUCTION: This exam was performed according to the departmental dose-optimization program which includes automated exposure control, adjustment of the mA and/or kV according to patient size and/or use of iterative reconstruction technique. COMPARISON:  03/18/2018 FINDINGS: Lower chest: No acute pleural or parenchymal lung disease. Hepatobiliary: Unremarkable unenhanced appearance of the liver and gallbladder. Pancreas: Unremarkable unenhanced appearance. Spleen: Unremarkable unenhanced appearance. Adrenals/Urinary Tract: Interval development of severe left renal atrophy. Stable unenhanced appearance of the right kidney. No urinary tract calculi or obstructive uropathy. Bladder is decompressed, limiting its evaluation. The adrenals are unremarkable. Stomach/Bowel: No bowel obstruction or ileus. Normal appendix right lower quadrant. Distal colonic diverticulosis without diverticulitis. No bowel wall thickening or inflammatory change. Vascular/Lymphatic: Diffuse atherosclerosis of the aorta and its branches. Evaluation of the aortic lumen is limited without IV contrast. There is atherosclerosis of the bilateral renal arteries, and if left renal artery stenosis is suspected given asymmetric left renal atrophy renal Doppler ultrasound could be considered. No pathologic adenopathy within the abdomen or pelvis. Reproductive: Status post hysterectomy. No adnexal masses. Other: No free fluid or free intraperitoneal gas. No abdominal wall hernia. Musculoskeletal: Left hip arthroplasty. There is  sclerosis in the subcapital region of the right femoral neck, with possible subtle trabecular disruption on the coronal and axial images. Findings could reflect an occult subcapital right femoral neck fracture or stress/insufficiency fracture. If there is pain referable to this region, MRI may be useful. No other acute bony abnormalities. Reconstructed images demonstrate no additional findings. IMPRESSION: 1. Interval development of marked left renal atrophy. Given the associated atherosclerosis of the aorta and its branches, left renal artery stenosis is suspected. Further evaluation with renal Doppler ultrasound may be useful. 2. Abnormal appearance of the subcapital region of the right femoral neck,  which may reflect sequela of occult subcapital femoral neck fracture or insufficiency fracture. If pain is referable to this region, MRI may be useful. 3. Distal colonic diverticulosis without diverticulitis. 4.  Aortic Atherosclerosis (ICD10-I70.0). Electronically Signed   By: Sharlet Salina M.D.   On: 10/18/2022 16:52   CT Head Wo Contrast  Result Date: 10/18/2022 CLINICAL DATA:  Head trauma history of strokes EXAM: CT HEAD WITHOUT CONTRAST TECHNIQUE: Contiguous axial images were obtained from the base of the skull through the vertex without intravenous contrast. RADIATION DOSE REDUCTION: This exam was performed according to the departmental dose-optimization program which includes automated exposure control, adjustment of the mA and/or kV according to patient size and/or use of iterative reconstruction technique. COMPARISON:  10/04/2022 FINDINGS: Brain: No evidence of acute infarction, hemorrhage, hydrocephalus, extra-axial collection or mass lesion/mass effect. Extensive periventricular and deep white matter hypodensity, similar to prior examination. Vascular: No hyperdense vessel or unexpected calcification. Skull: Normal. Negative for fracture or focal lesion. Sinuses/Orbits: No acute finding. Other: None.  IMPRESSION: No acute intracranial pathology. Advanced small-vessel white matter disease, unchanged compared to prior examination. Consider MRI to more sensitively evaluate for acute diffusion restricting infarction if suspected. Electronically Signed   By: Jearld Lesch M.D.   On: 10/18/2022 16:47   DG Chest Portable 1 View  Result Date: 10/18/2022 CLINICAL DATA:  Provided history: Weakness. EXAM: PORTABLE CHEST 1 VIEW COMPARISON:  Prior chest radiographs 10/04/2022 and earlier. FINDINGS: Heart size at the upper limits of normal. Aortic atherosclerosis. Ill-defined opacity within the medial right lung base. No appreciable airspace consolidation on the left. No evidence of pleural effusion or pneumothorax. No acute osseous abnormality identified. Degenerative changes of the spine. Mild levocurvature of the lower thoracic spine. IMPRESSION: 1. Ill-defined opacity within medial right lung base. This may reflect atelectasis. However, pneumonia cannot excluded. Correlate clinically consider short-interval radiographic follow-up. 2.  Aortic Atherosclerosis (ICD10-I70.0). Electronically Signed   By: Jackey Loge D.O.   On: 10/18/2022 16:32    Results are pending, will review when available.  Assessment and Plan:  * Acute kidney injury Patient is presenting with elevated creatinine at 1.9 with BUN of 21. CT renal stone study was notable for interval development of marked left renal atrophy compared to 2019 concerning for left renal artery stenosis.  Uncertain if this is playing a role at this time.  Patient's daughter states water intake has been adequate.   - Renal duplex ordered - Gentle maintenance fluids overnight - Repeat BMP in the a.m. - Avoid nephrotoxic agents including home lisinopril - If no improvement, could consider nephrology consultation  Acute metabolic encephalopathy Per patient's daughter, patient is not currently at baseline.  On exam, it is notable that patient's affect is quite flat  and she seems dazed.  She is having some difficulty following instructions during exam.  Etiology uncertain at this time.  She has a history of previous CVA with last MRI demonstrating cerebral atrophy.  Despite AKI, BUN is within normal limits.  TSH is within normal limits for age.  Urinalysis demonstrated some bacteria, however a lot of squamous epithelial cells present and she denies any urinary symptoms.  - MRI brain given dysarthria and left-sided facial droop this a.m. - Vitamin B12 - Consider neurology consultation if no improvement.   Right hip pain Per patient's daughter, patient had a hard fall in December 2023 and took her several months to recover.  She continues to have right-sided hip pain at this time.  CT renal stone  study noted abnormal appearance of the subcapital region of the right femoral neck concerning for possible fracture.  - Dedicated right hip CT ordered  Essential hypertension Patient had an episode of hypotension this morning after bearing down to use the restroom most consistent with vasovagal.  Given AKI, will hold lisinopril.  - Holding home antihypertensives at this time  Chronic diastolic CHF (congestive heart failure) Euvolemic on examination.  Will monitor fluid status carefully while receiving IV fluids.  Advance Care Planning:   Code Status: Full Code   Consults: None  Family Communication: Patient's daughter updated at bedside  Severity of Illness: The appropriate patient status for this patient is OBSERVATION. Observation status is judged to be reasonable and necessary in order to provide the required intensity of service to ensure the patient's safety. The patient's presenting symptoms, physical exam findings, and initial radiographic and laboratory data in the context of their medical condition is felt to place them at decreased risk for further clinical deterioration. Furthermore, it is anticipated that the patient will be medically stable for  discharge from the hospital within 2 midnights of admission.   Author: Verdene Lennert, MD 10/18/2022 7:54 PM  For on call review [email or web address].

## 2022-10-18 NOTE — ED Provider Notes (Signed)
[facility] Provider Note    Event Date/Time   First MD Initiated Contact with Patient 10/18/22 1514     (approximate)   History   Weakness   HPI  Victoria Gutierrez is a 77 y.o. female  hypertension, hyperlipidemia, diastolic CHF, stroke, depression, CKD-3   Patient very hard of hearing, defers to her daughter to answer most questions.  Patient endorses no pain or discomfort.  Daughter reports patient was in the hospital about 2 weeks ago with very similar symptoms she just become very fatigued increasing weakness over the last couple days.  Very weak in both of her legs.  Her speech has been a little bit off for the last day as well.  Daughter reports that the symptoms started at about the exact same way they did 2 weeks ago at that point she was diagnosed with possible UTI.  If not noticed any fevers at the house.  No chest pain no cough no headaches no other symptoms are noted.  She ate a full breakfast this morning and daughter reports she has been staying hydrated eating and drinking  Recently treated for and admitted to the hospital for acute kidney injury, possible urinary tract infection.        Physical Exam   Triage Vital Signs: ED Triage Vitals [10/18/22 1342]  Enc Vitals Group     BP 115/79     Pulse Rate 88     Resp 18     Temp 97.9 F (36.6 C)     Temp Source Oral     SpO2 97 %     Weight      Height      Head Circumference      Peak Flow      Pain Score      Pain Loc      Pain Edu?      Excl. in GC?     Most recent vital signs: Vitals:   10/18/22 1342  BP: 115/79  Pulse: 88  Resp: 18  Temp: 97.9 F (36.6 C)  SpO2: 97%     General: Awake, no distress.  Identifies her birthdate, her full name identifies her daughter at the bedside correctly.  She is hard of hearing chronically CV:  Good peripheral perfusion.  Normal tones and rate Resp:  Normal effort.  Clear bilateral Abd:  No distention.  Left nontender nondistended  throughout Other:  Very fatigued about 3-5 strength the lower legs bilaterally.  Able to range the legs without pain no pain on axial loading.  Daughter reports she had a fall in December that resulted in quite a bit of right hip pain but no interim injuries or known   ED Results / Procedures / Treatments   Labs (all labs ordered are listed, but only abnormal results are displayed) Labs Reviewed  BASIC METABOLIC PANEL - Abnormal; Notable for the following components:      Result Value   Potassium 2.9 (*)    Glucose, Bld 109 (*)    Creatinine, Ser 1.91 (*)    GFR, Estimated 27 (*)    All other components within normal limits  CBC - Abnormal; Notable for the following components:   WBC 14.2 (*)    Hemoglobin 16.0 (*)    All other components within normal limits  URINALYSIS, ROUTINE W REFLEX MICROSCOPIC - Abnormal; Notable for the following components:   Color, Urine YELLOW (*)    APPearance HAZY (*)  Protein, ur 100 (*)    Bacteria, UA RARE (*)    All other components within normal limits  TSH - Abnormal; Notable for the following components:   TSH 6.271 (*)    All other components within normal limits  RESP PANEL BY RT-PCR (FLU A&B, COVID) ARPGX2  MAGNESIUM  T4, FREE  PROCALCITONIN  CBG MONITORING, ED     EKG  Reviewed inter by me at 1350 heart rate 90 QRS 150 QTc 500 Normal sinus rhythm, right bundle branch block.   RADIOLOGY  CT Renal Stone Study  Result Date: 10/18/2022 CLINICAL DATA:  Abdominal and flank pain, acute renal insufficiency EXAM: CT ABDOMEN AND PELVIS WITHOUT CONTRAST TECHNIQUE: Multidetector CT imaging of the abdomen and pelvis was performed following the standard protocol without IV contrast. Unenhanced CT was performed per clinician order. Lack of IV contrast limits sensitivity and specificity, especially for evaluation of abdominal/pelvic solid viscera. RADIATION DOSE REDUCTION: This exam was performed according to the departmental dose-optimization  program which includes automated exposure control, adjustment of the mA and/or kV according to patient size and/or use of iterative reconstruction technique. COMPARISON:  03/18/2018 FINDINGS: Lower chest: No acute pleural or parenchymal lung disease. Hepatobiliary: Unremarkable unenhanced appearance of the liver and gallbladder. Pancreas: Unremarkable unenhanced appearance. Spleen: Unremarkable unenhanced appearance. Adrenals/Urinary Tract: Interval development of severe left renal atrophy. Stable unenhanced appearance of the right kidney. No urinary tract calculi or obstructive uropathy. Bladder is decompressed, limiting its evaluation. The adrenals are unremarkable. Stomach/Bowel: No bowel obstruction or ileus. Normal appendix right lower quadrant. Distal colonic diverticulosis without diverticulitis. No bowel wall thickening or inflammatory change. Vascular/Lymphatic: Diffuse atherosclerosis of the aorta and its branches. Evaluation of the aortic lumen is limited without IV contrast. There is atherosclerosis of the bilateral renal arteries, and if left renal artery stenosis is suspected given asymmetric left renal atrophy renal Doppler ultrasound could be considered. No pathologic adenopathy within the abdomen or pelvis. Reproductive: Status post hysterectomy. No adnexal masses. Other: No free fluid or free intraperitoneal gas. No abdominal wall hernia. Musculoskeletal: Left hip arthroplasty. There is sclerosis in the subcapital region of the right femoral neck, with possible subtle trabecular disruption on the coronal and axial images. Findings could reflect an occult subcapital right femoral neck fracture or stress/insufficiency fracture. If there is pain referable to this region, MRI may be useful. No other acute bony abnormalities. Reconstructed images demonstrate no additional findings. IMPRESSION: 1. Interval development of marked left renal atrophy. Given the associated atherosclerosis of the aorta and its  branches, left renal artery stenosis is suspected. Further evaluation with renal Doppler ultrasound may be useful. 2. Abnormal appearance of the subcapital region of the right femoral neck, which may reflect sequela of occult subcapital femoral neck fracture or insufficiency fracture. If pain is referable to this region, MRI may be useful. 3. Distal colonic diverticulosis without diverticulitis. 4.  Aortic Atherosclerosis (ICD10-I70.0). Electronically Signed   By: Sharlet Salina M.D.   On: 10/18/2022 16:52   CT Head Wo Contrast  Result Date: 10/18/2022 CLINICAL DATA:  Head trauma history of strokes EXAM: CT HEAD WITHOUT CONTRAST TECHNIQUE: Contiguous axial images were obtained from the base of the skull through the vertex without intravenous contrast. RADIATION DOSE REDUCTION: This exam was performed according to the departmental dose-optimization program which includes automated exposure control, adjustment of the mA and/or kV according to patient size and/or use of iterative reconstruction technique. COMPARISON:  10/04/2022 FINDINGS: Brain: No evidence of acute infarction, hemorrhage, hydrocephalus, extra-axial collection  or mass lesion/mass effect. Extensive periventricular and deep white matter hypodensity, similar to prior examination. Vascular: No hyperdense vessel or unexpected calcification. Skull: Normal. Negative for fracture or focal lesion. Sinuses/Orbits: No acute finding. Other: None. IMPRESSION: No acute intracranial pathology. Advanced small-vessel white matter disease, unchanged compared to prior examination. Consider MRI to more sensitively evaluate for acute diffusion restricting infarction if suspected. Electronically Signed   By: Jearld Lesch M.D.   On: 10/18/2022 16:47   DG Chest Portable 1 View  Result Date: 10/18/2022 CLINICAL DATA:  Provided history: Weakness. EXAM: PORTABLE CHEST 1 VIEW COMPARISON:  Prior chest radiographs 10/04/2022 and earlier. FINDINGS: Heart size at the upper  limits of normal. Aortic atherosclerosis. Ill-defined opacity within the medial right lung base. No appreciable airspace consolidation on the left. No evidence of pleural effusion or pneumothorax. No acute osseous abnormality identified. Degenerative changes of the spine. Mild levocurvature of the lower thoracic spine. IMPRESSION: 1. Ill-defined opacity within medial right lung base. This may reflect atelectasis. However, pneumonia cannot excluded. Correlate clinically consider short-interval radiographic follow-up. 2.  Aortic Atherosclerosis (ICD10-I70.0). Electronically Signed   By: Jackey Loge D.O.   On: 10/18/2022 16:32    No obvious signs of pneumonia noted on clinical assessment.  Will send procalcitonin, Dr. Huel Cote to follow  PROCEDURES:  Critical Care performed: No  Procedures   MEDICATIONS ORDERED IN ED: Medications  sodium chloride 0.9 % bolus 500 mL (500 mLs Intravenous New Bag/Given 10/18/22 1617)     IMPRESSION / MDM / ASSESSMENT AND PLAN / ED COURSE  I reviewed the triage vital signs and the nursing notes.                              Differential diagnosis includes, but is not limited to, AKI of different potential natures including renal, prerenal and obstructive causation, infection, inflammation, all considered.  She does not have any obvious new acute neurologic deficit she has mild weakness on the left side chronically.  Today I do not see evidence of acute focal abnormality, and her daughter reports the same symptoms occurred about 2 weeks ago when she was admitted for urinary tract infection and AKI.  I suspect at this point given her labs have been reviewed with elevated creatinine that she has recurrent AKI likely contributing to her increased weakness and mild mental status changes  Imaging of the abdomen pelvis reveals low left renal atrophy, question if the patient may have renal disease contributing now as well.  Discussed with patient's daughter and patient,  they understand agreeable plan for admission and further workup as to cause of her presentation today.  At this juncture I do not yet see clear indication for antibiotic.  Procalcitonin is pending hospitalist will follow  Patient's presentation is most consistent with acute complicated illness / injury requiring diagnostic workup.   The patient is on the cardiac monitor to evaluate for evidence of arrhythmia and/or significant heart rate changes.  Consulted with the hospitalist, accepted the care of Dr. Norma Fredrickson reviewed notable for leukocytosis, potassium 2.9.  FINAL CLINICAL IMPRESSION(S) / ED DIAGNOSES   Final diagnoses:  Weakness  Generalized weakness  AKI (acute kidney injury)     Rx / DC Orders   ED Discharge Orders     None        Note:  This document was prepared using Dragon voice recognition software and may include unintentional dictation errors.  Sharyn Creamer, MD 10/18/22 [phone]

## 2022-10-18 NOTE — Progress Notes (Addendum)
  Progress Note  Updated A&P given notable MRI brain and CT hip findings. Patient and family notified.   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 No prior history of A fib. Compliant with Aspirin and Plavix, with no missed doses.   - Neurology consulted; appreciate their recommendations - Carotid dopplers ordered given AKI - Telemetry monitoring - Allow for permissive HTN (systolic < 220 and diastolic < 120)  - Echocardiogram  - Lipid panel  - ASA 81 mg daily  - Statin   Right Femur Fracture: CT imaging confirmed femur fracture.  Discussed with Dr. Allena Katz; given difficulty discerning timing of fracture, would be beneficial to obtain plain x-ray with MRI to help delineate acuity.  Given patient remains symptomatic, she will very likely require repair and if MRI confirms acute fracture, may be able to repair tomorrow afternoon/evening, pending workup of additional medical conditions listed in H&P.  - Orthopedic surgery consulted - Hip x-ray and MRI ordered   Author: Verdene Lennert, MD 10/18/2022 8:32 PM  For on call review [email or web address].

## 2022-10-18 NOTE — ED Triage Notes (Signed)
Pt here with weakness and slurred speech via [facility]. Pt was not able to get back up from the toilet, pt has a left side deficit. Pt does not have a facial droop. Pt has a hx of 2 strokes. Pt was recently treated for a UTI.   96% 165-cbg

## 2022-10-18 NOTE — ED Notes (Signed)
Patient returned to room ED 09 from imaging at this time.

## 2022-10-18 NOTE — Assessment & Plan Note (Signed)
Euvolemic on examination.  Will monitor fluid status carefully while receiving IV fluids.

## 2022-10-19 ENCOUNTER — Observation Stay: Payer: Medicare HMO

## 2022-10-19 ENCOUNTER — Observation Stay (HOSPITAL_BASED_OUTPATIENT_CLINIC_OR_DEPARTMENT_OTHER)
Admit: 2022-10-19 | Discharge: 2022-10-19 | Disposition: A | Discharge status: Home / Self Care | Payer: Medicare HMO | Attending: Internal Medicine | Admitting: Internal Medicine

## 2022-10-19 DIAGNOSIS — N261 Atrophy of kidney (terminal): Secondary | ICD-10-CM | POA: Diagnosis not present

## 2022-10-19 DIAGNOSIS — S72001A Fracture of unspecified part of neck of right femur, initial encounter for closed fracture: Secondary | ICD-10-CM | POA: Diagnosis not present

## 2022-10-19 DIAGNOSIS — I639 Cerebral infarction, unspecified: Secondary | ICD-10-CM | POA: Diagnosis not present

## 2022-10-19 DIAGNOSIS — I6389 Other cerebral infarction: Secondary | ICD-10-CM | POA: Diagnosis not present

## 2022-10-19 DIAGNOSIS — I6523 Occlusion and stenosis of bilateral carotid arteries: Secondary | ICD-10-CM | POA: Diagnosis not present

## 2022-10-19 DIAGNOSIS — N179 Acute kidney failure, unspecified: Secondary | ICD-10-CM | POA: Diagnosis not present

## 2022-10-19 LAB — CBC
HCT: 45.2 % (ref 36.0–46.0)
Hemoglobin: 15.2 g/dL — ABNORMAL HIGH (ref 12.0–15.0)
MCH: 31.9 pg (ref 26.0–34.0)
MCHC: 33.6 g/dL (ref 30.0–36.0)
MCV: 94.8 fL (ref 80.0–100.0)
Platelets: 228 10*3/uL (ref 150–400)
RBC: 4.77 MIL/uL (ref 3.87–5.11)
RDW: 12.1 % (ref 11.5–15.5)
WBC: 12.1 10*3/uL — ABNORMAL HIGH (ref 4.0–10.5)
nRBC: 0 % (ref 0.0–0.2)

## 2022-10-19 LAB — ECHOCARDIOGRAM COMPLETE
AR max vel: 3.16 cm2
AV Area VTI: 3.79 cm2
AV Area mean vel: 3.35 cm2
AV Mean grad: 3 mmHg
AV Peak grad: 5.2 mmHg
Ao pk vel: 1.15 m/s
Area-P 1/2: 4.29 cm2
Height: 67 in
MV VTI: 2.81 cm2
S' Lateral: 2.3 cm
Weight: 2567.92 oz

## 2022-10-19 LAB — LIPID PANEL
Cholesterol: 171 mg/dL (ref 0–200)
HDL: 36 mg/dL — ABNORMAL LOW (ref >40)
LDL Cholesterol: 118 mg/dL — ABNORMAL HIGH (ref 0–99)
Total CHOL/HDL Ratio: 4.8 RATIO
Triglycerides: 85 mg/dL (ref <150)
VLDL: 17 mg/dL (ref 0–40)

## 2022-10-19 LAB — BASIC METABOLIC PANEL
Anion gap: 11 (ref 5–15)
BUN: 29 mg/dL — ABNORMAL HIGH (ref 8–23)
CO2: 23 mmol/L (ref 22–32)
Calcium: 9 mg/dL (ref 8.9–10.3)
Chloride: 103 mmol/L (ref 98–111)
Creatinine, Ser: 2.01 mg/dL — ABNORMAL HIGH (ref 0.44–1.00)
GFR, Estimated: 25 mL/min — ABNORMAL LOW (ref >60)
Glucose, Bld: 109 mg/dL — ABNORMAL HIGH (ref 70–99)
Potassium: 3.3 mmol/L — ABNORMAL LOW (ref 3.5–5.1)
Sodium: 137 mmol/L (ref 135–145)

## 2022-10-19 LAB — PHOSPHORUS: Phosphorus: 4.3 mg/dL (ref 2.5–4.6)

## 2022-10-19 LAB — MAGNESIUM: Magnesium: 2.2 mg/dL (ref 1.7–2.4)

## 2022-10-19 MED ORDER — POTASSIUM CHLORIDE CRYS ER 20 MEQ PO TBCR
20.0000 meq | EXTENDED_RELEASE_TABLET | Freq: Once | ORAL | Status: AC
  Administered 2022-10-19: 20 meq via ORAL
  Filled 2022-10-19: qty 1

## 2022-10-19 NOTE — Progress Notes (Signed)
*  PRELIMINARY RESULTS* Echocardiogram 2D Echocardiogram has been performed.  Cristela Blue 10/19/2022, 8:24 AM

## 2022-10-19 NOTE — Consult Note (Signed)
ORTHOPAEDIC CONSULTATION  REQUESTING PHYSICIAN: Gillis Santa, MD  Chief Complaint:   R hip pain  History of Present Illness: History obtained from patient, discussion with medical providers, review of chart, and discussion with daughter, Victoria Gutierrez, over the phone.  Victoria Gutierrez is a 77 y.o. female with a past medical history of hypertension, hyperlipidemia, CHF, prior CVA, and CKD who was admitted to the hospital yesterday due to findings of acute kidney injury, metabolic encephalopathy, right hip pain.  She had a significant fall in December 2023 and had significant difficulty weightbearing and underwent extensive physical therapy.  It took her many months to begin to ambulate again, but her ambulation has not returned to her baseline status.  After that fall, she was evaluated at [facility] clinic, and radiographs do not show any fractures at that time.  Patient underwent a CT scan yesterday which showed a valgus impacted femoral neck fracture.  There was an incident yesterday of her slowly sinking down off the bed, but she did not fall onto her hip at all.  An MRI was also obtained and this suggests a subacute/chronic fracture with significant portion of the fracture is healed with some underlying marrow edema.  There is also current concern for acute CVA based on MRI brain findings and admission findings of facial droop.  She is on aspirin and Plavix at baseline.  Past Medical History:  Diagnosis Date   Arthritis    knees, hands   H/O thyroid disease    No issues currently   HOH (hard of hearing)    Hypertension    Stroke 12/2018   Wears hearing aid in both ears    has, does not wear   Past Surgical History:  Procedure Laterality Date   ABDOMINAL HYSTERECTOMY     CATARACT EXTRACTION W/PHACO Right 09/18/2019   Procedure: CATARACT EXTRACTION PHACO AND INTRAOCULAR LENS PLACEMENT (IOC) RIGHT 11.65  01:03.3;  Surgeon:  Galen Manila, MD;  Location: [facility];  Service: Ophthalmology;  Laterality: Right;   CATARACT EXTRACTION W/PHACO Left 10/09/2019   Procedure: CATARACT EXTRACTION PHACO AND INTRAOCULAR LENS PLACEMENT (IOC) LEFT 10.30  00:48.5;  Surgeon: Galen Manila, MD;  Location: [facility];  Service: Ophthalmology;  Laterality: Left;   TOTAL HIP ARTHROPLASTY Left 04/05/2019   Procedure: TOTAL HIP ARTHROPLASTY ANTERIOR APPROACH;  Surgeon: Kennedy Bucker, MD;  Location: [facility];  Service: Orthopedics;  Laterality: Left;   Social History   Socioeconomic History   Marital status: Divorced    Spouse name: Not on file   Number of children: Not on file   Years of education: Not on file   Highest education level: Not on file  Occupational History   Not on file  Tobacco Use   Smoking status: Every Day    Packs/day: 0.50    Years: 38.00    Additional pack years: 0.00    Total pack years: 19.00    Types: Cigarettes    Start date: 03/10/1987   Smokeless tobacco: Never   Tobacco comments:    since age 28  Vaping Use   Vaping Use: Never used  Substance and Sexual Activity   Alcohol use: No   Drug use: No   Sexual activity: Not on file  Other Topics Concern   Not on file  Social History Narrative   Not on file   Social Determinants of Health   Financial Resource Strain: Low Risk  (06/09/2017)   Overall Financial Resource Strain (CARDIA)    Difficulty of  Paying Living Expenses: Not hard at all  Food Insecurity: No Food Insecurity (10/18/2022)   Hunger Vital Sign    Worried About Running Out of Food in the Last Year: Never true    Ran Out of Food in the Last Year: Never true  Transportation Needs: No Transportation Needs (10/18/2022)   PRAPARE - Administrator, Civil Service (Medical): No    Lack of Transportation (Non-Medical): No  Physical Activity: Unknown (06/09/2017)   Exercise Vital Sign    Days of Exercise per Week: Patient declined    Minutes of  Exercise per Session: Patient declined  Stress: No Stress Concern Present (06/09/2017)   [employer] of Occupational Health - Occupational Stress Questionnaire    Feeling of Stress : Not at all  Social Connections: Unknown (06/09/2017)   Social Connection and Isolation Panel [NHANES]    Frequency of Communication with Friends and Family: Patient declined    Frequency of Social Gatherings with Friends and Family: Patient declined    Attends Religious Services: Patient declined    Database administrator or Organizations: Patient declined    Attends Engineer, structural: Patient declined    Marital Status: Patient declined   Family History  Problem Relation Age of Onset   Heart disease Mother    Heart disease Father    Breast cancer Neg Hx    No Known Allergies Prior to Admission medications   Medication Sig Start Date End Date Taking? Authorizing Provider  aspirin EC 325 MG tablet Take 325 mg by mouth daily.   Yes [provider]  atorvastatin (LIPITOR) 80 MG tablet Take 1 tablet (80 mg total) by mouth daily at 6 PM. 02/28/22  Yes Lonia Blood, MD  clopidogrel (PLAVIX) 75 MG tablet Take 1 tablet (75 mg total) by mouth daily. 03/01/22  Yes Lonia Blood, MD  lisinopril (ZESTRIL) 20 MG tablet Take 20 mg by mouth daily.   Yes [provider]  meclizine (ANTIVERT) 12.5 MG tablet Take 12.5 mg by mouth 3 (three) times daily as needed for dizziness. 10/07/22  Yes [provider]  amLODipine (NORVASC) 10 MG tablet Take 1 tablet (10 mg total) by mouth daily. Patient not taking: Reported on 10/18/2022 03/01/22   Lonia Blood, MD   Recent Labs    10/18/22 1342 10/19/22 0518  WBC 14.2* 12.1*  HGB 16.0* 15.2*  HCT 46.0 45.2  PLT 305 228  K 2.9* 3.3*  CL 98 103  CO2 28 23  BUN 21 29*  CREATININE 1.91* 2.01*  GLUCOSE 109* 109*  CALCIUM 9.5 9.0   DG HIP UNILAT WITH PELVIS 2-3 VIEWS RIGHT  Result Date: 10/18/2022 CLINICAL DATA:  Closed  right hip fracture. EXAM: DG HIP (WITH OR WITHOUT PELVIS) 2-3V RIGHT COMPARISON:  CT 10/18/2022 FINDINGS: Subcapital fracture of the right proximal femur. Sclerosis consistent with impaction of the fracture but could indicate early healing although no callus formation is identified. No evidence of inter trochanteric involvement. Mild valgus angulation. Degenerative changes in the hip joint. No dislocation. Previous left hip arthroplasty with components appearing well seated. IMPRESSION: Acute subcapital impacted fracture of the proximal right femur. Electronically Signed   By: Burman Nieves M.D.   On: 10/18/2022 22:12   CT HIP RIGHT WO CONTRAST  Result Date: 10/18/2022 CLINICAL DATA:  Hip pain with stress fracture suspected. Negative x-ray. Fell today. EXAM: CT OF THE RIGHT HIP WITHOUT CONTRAST TECHNIQUE: Multidetector CT imaging of the right hip was  performed according to the standard protocol. Multiplanar CT image reconstructions were also generated. RADIATION DOSE REDUCTION: This exam was performed according to the departmental dose-optimization program which includes automated exposure control, adjustment of the mA and/or kV according to patient size and/or use of iterative reconstruction technique. COMPARISON:  CT abdomen and pelvis 10/18/2022 FINDINGS: Bones/Joint/Cartilage Acute transverse fracture of the subcapital region of the left proximal femur with impaction of the fracture fragments and valgus alignment of the hip. Sclerosis in the superior femoral head may represent bone contusion, degenerative change, or early avascular necrosis. No dislocation at the hip joint. No destructive or expansile bone lesions. Ligaments Suboptimally assessed by CT. Muscles and Tendons No intramuscular hematoma or focal mass lesion. Soft tissues Calcification in the femoral artery. No significant soft tissue collection. IMPRESSION: Acute subcapital fracture of the proximal right femur with impaction of fracture  fragments and valgus angulation. Sclerosis in the femoral head could be related to bone contusion, degenerative change, or avascular necrosis. Electronically Signed   By: Burman Nieves M.D.   On: 10/18/2022 20:12   MR BRAIN WO CONTRAST  Result Date: 10/18/2022 CLINICAL DATA:  Altered mental status EXAM: MRI HEAD WITHOUT CONTRAST TECHNIQUE: Multiplanar, multiecho pulse sequences of the brain and surrounding structures were obtained without intravenous contrast. COMPARISON:  MRI Head 10/04/22 FINDINGS: Brain: Compared to prior exam there are new infarcts in the periventricular white matter in the right frontal lobe (series 5, image 24) and in the periatrial white matter on the left (series 5, image 29). Redemonstrated there is a chronic infarct in the right basal ganglia with linear regions of hyperintense signal on diffusion-weighted imaging, unchanged. No new sites of hemorrhage. No extra-axial fluid collection. Sequela of severe chronic microvascular ischemic change. No hydrocephalus. Vascular: Normal flow voids. Skull and upper cervical spine: Normal marrow signal. Sinuses/Orbits: Bilateral lens replacement. No mastoid or middle ear effusion. Polypoid mucosal thickening left maxillary sinus. Other: None. IMPRESSION: 1. New acute to subacute infarcts in the periventricular white matter in the right frontal lobe and in the periatrial white matter on the left. 2. Unchanged chronic infarcts involving the right basal ganglia. Electronically Signed   By: Lorenza Cambridge M.D.   On: 10/18/2022 20:04   CT Renal Stone Study  Result Date: 10/18/2022 CLINICAL DATA:  Abdominal and flank pain, acute renal insufficiency EXAM: CT ABDOMEN AND PELVIS WITHOUT CONTRAST TECHNIQUE: Multidetector CT imaging of the abdomen and pelvis was performed following the standard protocol without IV contrast. Unenhanced CT was performed per clinician order. Lack of IV contrast limits sensitivity and specificity, especially for evaluation of  abdominal/pelvic solid viscera. RADIATION DOSE REDUCTION: This exam was performed according to the departmental dose-optimization program which includes automated exposure control, adjustment of the mA and/or kV according to patient size and/or use of iterative reconstruction technique. COMPARISON:  03/18/2018 FINDINGS: Lower chest: No acute pleural or parenchymal lung disease. Hepatobiliary: Unremarkable unenhanced appearance of the liver and gallbladder. Pancreas: Unremarkable unenhanced appearance. Spleen: Unremarkable unenhanced appearance. Adrenals/Urinary Tract: Interval development of severe left renal atrophy. Stable unenhanced appearance of the right kidney. No urinary tract calculi or obstructive uropathy. Bladder is decompressed, limiting its evaluation. The adrenals are unremarkable. Stomach/Bowel: No bowel obstruction or ileus. Normal appendix right lower quadrant. Distal colonic diverticulosis without diverticulitis. No bowel wall thickening or inflammatory change. Vascular/Lymphatic: Diffuse atherosclerosis of the aorta and its branches. Evaluation of the aortic lumen is limited without IV contrast. There is atherosclerosis of the bilateral renal arteries, and if left renal artery  stenosis is suspected given asymmetric left renal atrophy renal Doppler ultrasound could be considered. No pathologic adenopathy within the abdomen or pelvis. Reproductive: Status post hysterectomy. No adnexal masses. Other: No free fluid or free intraperitoneal gas. No abdominal wall hernia. Musculoskeletal: Left hip arthroplasty. There is sclerosis in the subcapital region of the right femoral neck, with possible subtle trabecular disruption on the coronal and axial images. Findings could reflect an occult subcapital right femoral neck fracture or stress/insufficiency fracture. If there is pain referable to this region, MRI may be useful. No other acute bony abnormalities. Reconstructed images demonstrate no additional  findings. IMPRESSION: 1. Interval development of marked left renal atrophy. Given the associated atherosclerosis of the aorta and its branches, left renal artery stenosis is suspected. Further evaluation with renal Doppler ultrasound may be useful. 2. Abnormal appearance of the subcapital region of the right femoral neck, which may reflect sequela of occult subcapital femoral neck fracture or insufficiency fracture. If pain is referable to this region, MRI may be useful. 3. Distal colonic diverticulosis without diverticulitis. 4.  Aortic Atherosclerosis (ICD10-I70.0). Electronically Signed   By: Sharlet Salina M.D.   On: 10/18/2022 16:52   CT Head Wo Contrast  Result Date: 10/18/2022 CLINICAL DATA:  Head trauma history of strokes EXAM: CT HEAD WITHOUT CONTRAST TECHNIQUE: Contiguous axial images were obtained from the base of the skull through the vertex without intravenous contrast. RADIATION DOSE REDUCTION: This exam was performed according to the departmental dose-optimization program which includes automated exposure control, adjustment of the mA and/or kV according to patient size and/or use of iterative reconstruction technique. COMPARISON:  10/04/2022 FINDINGS: Brain: No evidence of acute infarction, hemorrhage, hydrocephalus, extra-axial collection or mass lesion/mass effect. Extensive periventricular and deep white matter hypodensity, similar to prior examination. Vascular: No hyperdense vessel or unexpected calcification. Skull: Normal. Negative for fracture or focal lesion. Sinuses/Orbits: No acute finding. Other: None. IMPRESSION: No acute intracranial pathology. Advanced small-vessel white matter disease, unchanged compared to prior examination. Consider MRI to more sensitively evaluate for acute diffusion restricting infarction if suspected. Electronically Signed   By: Jearld Lesch M.D.   On: 10/18/2022 16:47   DG Chest Portable 1 View  Result Date: 10/18/2022 CLINICAL DATA:  Provided history:  Weakness. EXAM: PORTABLE CHEST 1 VIEW COMPARISON:  Prior chest radiographs 10/04/2022 and earlier. FINDINGS: Heart size at the upper limits of normal. Aortic atherosclerosis. Ill-defined opacity within the medial right lung base. No appreciable airspace consolidation on the left. No evidence of pleural effusion or pneumothorax. No acute osseous abnormality identified. Degenerative changes of the spine. Mild levocurvature of the lower thoracic spine. IMPRESSION: 1. Ill-defined opacity within medial right lung base. This may reflect atelectasis. However, pneumonia cannot excluded. Correlate clinically consider short-interval radiographic follow-up. 2.  Aortic Atherosclerosis (ICD10-I70.0). Electronically Signed   By: Jackey Loge D.O.   On: 10/18/2022 16:32    MRI Right Hip: FINDINGS: Very limited examination due to patient motion.   As demonstrated on the CT scan there is a right femoral neck fracture. This appears to be a subacute healing fracture with minimal residual surrounding marrow edema and small joint effusion.The femoral head is normally located. No AVN.   Significant artifact related to a left hip prosthesis. No obvious complicating features are identified. The pubic symphysis and SIjoints are intact. No pelvic fractures bone lesions.   The right hip musculature is grossly normal. No muscle tear or intramuscular hematoma. No significant peritrochanteric tendinosisor bursitis.   No significant intrapelvic abnormalities are identified.  IMPRESSION: 1. Very limited examination due to patient motion. 2. Right femoral neck fracture appears to be a subacute healing fracture with minimal residual surrounding marrow edema and small joint effusion. 3. Significant artifact related to a left hip prosthesis. No obvious complicating features are identified. 4. No significant intrapelvic abnormalities.   Positive ROS: All other systems have been reviewed and were otherwise negative with the  exception of those mentioned in the HPI and as above.  Physical Exam: BP (!) 124/97 (BP Location: Left Arm)   Pulse 80   Temp 97.7 F (36.5 C)   Resp 18   Ht 5\' 7"  (1.702 m)   Wt 72.8 kg   SpO2 97%   BMI 25.14 kg/m  General:  Alert, no acute distress Psychiatric:  Patient is competent for consent with normal mood and affect     Orthopedic Exam:  RLE: + DF/PF/EHL SILT grossly over foot Foot wwp Negative log roll/axial load Hip range of motion: No significant pain with hip forward flexion to 80 degrees, external rotation to 30 degrees, and internal rotation to 15 degrees.  There is increased pain in the hip region with FADIR maneuver.   Imaging: As above: subacute/chronic valgus impacted femoral neck fracture that is essentially healed without sign of AVN.  There are significant degenerative changes present to the right hip joint.  There is a contralateral left total hip arthroplasty in place without complicating features.  Assessment/Plan: Victoria Gutierrez is a 77 y.o. female with a subacute/chronic valgus impacted femoral neck fracture that has essentially healed. 1.  I discussed the findings with the patient and called her daughter, Victoria Gutierrez, on the phone.  Patient appears to have a close to healed valgus impacted femoral neck fracture.  This was likely present around the time of her fall, which likely explains why she had such difficulty regaining ability to ambulate immediately afterwards. 2.  We discussed the various treatment options including both surgical and nonsurgical management.  Given that she has had persistent pain in her hip, surgical management may be of benefit.  We discussed that this could consist of either hemiarthroplasty or total hip arthroplasty.  Given that the fracture appears to have almost healed, and she is still significantly symptomatic, her symptoms may currently be related to underlying arthritis and a total hip arthroplasty may be a more beneficial  option. 3.  Given that her fracture does not appear to be acute and appears to have almost healed, we discussed that this was likely better managed as an outpatient, especially in the light of current medical conditions consisting of acute kidney injury and CVA.  Patient and daughter were in agreement with this plan. 4.  Would recommend PT/OT for mobilization when medically appropriate. 5.  Patient can follow-up with [place] as an outpatient at [facility] clinic after discharge.  Please page with any questions.  We will plan to follow peripherally during this admission.   10/19/2022 7:49 AM

## 2022-10-19 NOTE — Consult Note (Addendum)
NEUROLOGY CONSULTATION NOTE   Date of service: October 19, 2022 Patient Name: Victoria Gutierrez MRN:  [id number] DOB:  12-Mar-1946 Reason for consult: acute ischemic stroke Requesting physician: Dr. Gillis Santa _ _ _   _ __   _ __ _ _  __ __   _ __   __ _  History of Present Illness   This is a 77 year old woman with past medical history significant for hypertension, multiple prior strokes, hyperlipidemia, CKD stage III, heart failure with preserved ejection fraction, hard of hearing who presented to the ED yesterday with generalized weakness.  Daughter reported that she had been feeling more dazed.  She was recently admitted on April 8 for UTI, and hypotension.  Since she has been home she has had episodes of generalized weakness requiring her to be assisted to the ground.  Her systolic blood pressure was noted during one of these events to be 70.  During the same event her speech was dysarthric and she had a left facial droop.  She has had a chronic left facial droop since her most recent stroke but it appears to be fluctuating and worse than usual.  MRI brain on admission showed new acute to subacute infarcts in the periventricular white matter in the right frontal lobe and in the periatrial white matter on the left.  Carotid ultrasound showed moderate bilat atherosclerotic plaque not resulting in hemodynamically significant stenosis in either carotid artery.  CT angio head and neck was most recently performed on February 26, 2022 and showed multifocal severe stenosis of the intracranial vessels.  CNS imaging was personally reviewed.  TTE showed no intracardiac clot. She was on aspirin 325 mg daily and Plavix 75 mg daily prior to admission.    ROS   UTA 2/2 confusion  Past History   I have reviewed the following:  Past Medical History:  Diagnosis Date   Arthritis    knees, hands   H/O thyroid disease    No issues currently   HOH (hard of hearing)    Hypertension    Stroke 12/2018   Wears  hearing aid in both ears    has, does not wear   Past Surgical History:  Procedure Laterality Date   ABDOMINAL HYSTERECTOMY     CATARACT EXTRACTION W/PHACO Right 09/18/2019   Procedure: CATARACT EXTRACTION PHACO AND INTRAOCULAR LENS PLACEMENT (IOC) RIGHT 11.65  01:03.3;  Surgeon: Galen Manila, MD;  Location: [facility];  Service: Ophthalmology;  Laterality: Right;   CATARACT EXTRACTION W/PHACO Left 10/09/2019   Procedure: CATARACT EXTRACTION PHACO AND INTRAOCULAR LENS PLACEMENT (IOC) LEFT 10.30  00:48.5;  Surgeon: Galen Manila, MD;  Location: [facility];  Service: Ophthalmology;  Laterality: Left;   TOTAL HIP ARTHROPLASTY Left 04/05/2019   Procedure: TOTAL HIP ARTHROPLASTY ANTERIOR APPROACH;  Surgeon: Kennedy Bucker, MD;  Location: [facility];  Service: Orthopedics;  Laterality: Left;   Family History  Problem Relation Age of Onset   Heart disease Mother    Heart disease Father    Breast cancer Neg Hx    Social History   Socioeconomic History   Marital status: Divorced    Spouse name: Not on file   Number of children: Not on file   Years of education: Not on file   Highest education level: Not on file  Occupational History   Not on file  Tobacco Use   Smoking status: Every Day    Packs/day: 0.50    Years: 38.00    Additional pack  years: 0.00    Total pack years: 19.00    Types: Cigarettes    Start date: 03/10/1987   Smokeless tobacco: Never   Tobacco comments:    since age 72  Vaping Use   Vaping Use: Never used  Substance and Sexual Activity   Alcohol use: No   Drug use: No   Sexual activity: Not on file  Other Topics Concern   Not on file  Social History Narrative   Not on file   Social Determinants of Health   Financial Resource Strain: Low Risk  (06/09/2017)   Overall Financial Resource Strain (CARDIA)    Difficulty of Paying Living Expenses: Not hard at all  Food Insecurity: No Food Insecurity (10/18/2022)   Hunger Vital Sign     Worried About Running Out of Food in the Last Year: Never true    Ran Out of Food in the Last Year: Never true  Transportation Needs: No Transportation Needs (10/18/2022)   PRAPARE - Administrator, Civil Service (Medical): No    Lack of Transportation (Non-Medical): No  Physical Activity: Unknown (06/09/2017)   Exercise Vital Sign    Days of Exercise per Week: Patient declined    Minutes of Exercise per Session: Patient declined  Stress: No Stress Concern Present (06/09/2017)   [employer] of Occupational Health - Occupational Stress Questionnaire    Feeling of Stress : Not at all  Social Connections: Unknown (06/09/2017)   Social Connection and Isolation Panel [NHANES]    Frequency of Communication with Friends and Family: Patient declined    Frequency of Social Gatherings with Friends and Family: Patient declined    Attends Religious Services: Patient declined    Database administrator or Organizations: Patient declined    Attends Banker Meetings: Patient declined    Marital Status: Patient declined   No Known Allergies  Medications   Medications Prior to Admission  Medication Sig Dispense Refill Last Dose   aspirin EC 325 MG tablet Take 325 mg by mouth daily.      atorvastatin (LIPITOR) 80 MG tablet Take 1 tablet (80 mg total) by mouth daily at 6 PM. 30 tablet 2 Unknown at Unknown   clopidogrel (PLAVIX) 75 MG tablet Take 1 tablet (75 mg total) by mouth daily. 30 tablet 2 Unknown at Unknown   lisinopril (ZESTRIL) 20 MG tablet Take 20 mg by mouth daily.   10/18/2022 at 0800   meclizine (ANTIVERT) 12.5 MG tablet Take 12.5 mg by mouth 3 (three) times daily as needed for dizziness.   Unknown at PRN   amLODipine (NORVASC) 10 MG tablet Take 1 tablet (10 mg total) by mouth daily. (Patient not taking: Reported on 10/18/2022) 30 tablet 2 Not Taking      Current Facility-Administered Medications:    acetaminophen (TYLENOL) tablet 650 mg, 650 mg, Oral, Q6H  PRN **OR** acetaminophen (TYLENOL) suppository 650 mg, 650 mg, Rectal, Q6H PRN, Verdene Lennert, MD   aspirin EC tablet 81 mg, 81 mg, Oral, Daily, Verdene Lennert, MD   atorvastatin (LIPITOR) tablet 80 mg, 80 mg, Oral, q1800, Verdene Lennert, MD   clopidogrel (PLAVIX) tablet 75 mg, 75 mg, Oral, Daily, Verdene Lennert, MD   enoxaparin (LOVENOX) injection 30 mg, 30 mg, Subcutaneous, Q24H, Huel Cote, Iulia, MD, 30 mg at 10/19/22 0423   ondansetron (ZOFRAN) tablet 4 mg, 4 mg, Oral, Q6H PRN **OR** ondansetron (ZOFRAN) injection 4 mg, 4 mg, Intravenous, Q6H PRN, Verdene Lennert, MD   polyethylene glycol (MIRALAX /  GLYCOLAX) packet 17 g, 17 g, Oral, Daily PRN, Verdene Lennert, MD   potassium chloride SA (KLOR-CON M) CR tablet 20 mEq, 20 mEq, Oral, Once, Gillis Santa, MD   sodium chloride flush (NS) 0.9 % injection 3 mL, 3 mL, Intravenous, Q12H, Verdene Lennert, MD, 3 mL at 10/19/22 0428  Vitals   Vitals:   10/18/22 2200 10/18/22 2315 10/19/22 0442 10/19/22 0740  BP: (!) 137/92 121/78 (!) 153/95 (!) 124/97  Pulse: 88 87 89 80  Resp: 20 18 18 18   Temp:  98.2 F (36.8 C) 98.1 F (36.7 C) 97.7 F (36.5 C)  TempSrc:  Oral Oral   SpO2: 95% 95% 98% 97%  Weight:  72.8 kg    Height:  5\' 7"  (1.702 m)       Body mass index is 25.14 kg/m.  Physical Exam   Physical Exam Gen: alert, oriented to self only HEENT: Atraumatic, normocephalic;mucous membranes moist; oropharynx clear, tongue without atrophy or fasciculations. Neck: Supple, trachea midline. Resp: CTAB, no w/r/r CV: RRR, no m/g/r; nml S1 and S2. 2+ symmetric peripheral pulses. Abd: soft/NT/ND; nabs x 4 quad Extrem: Nml bulk; no cyanosis, clubbing, or edema.  Neuro: *MS: alert, oriented to self only, follows simple commands *Speech: mild dysarthria, mildly impaired naming *CN:    I: Deferred   II,III: PERRLA, blinks to threat bilat, optic discs unable to be visualized 2/2 pupillary constriction   III,IV,VI: EOMI w/o nystagmus, no  ptosis   V: Sensation intact from V1 to V3 to LT   VII: Eyelid closure was full.  L facial droop   VIII: Hearing intact to voice   IX,X: Voice normal, palate elevates symmetrically    XI: SCM/trap 5/5 bilat   XII: Tongue protrudes midline, no atrophy or fasciculations  *Motor:   Normal bulk.  No tremor, rigidity or bradykinesia. No drift BUE, drift LUE, drift to bed RLE. *Sensory: SILT. No double-simultaneous extinction.  *Coordination:  Dysmetria on FNF bilat *Reflexes:  2+ and symmetric throughout without clonus; toes down-going bilat *Gait: deferred  NIHSS  1a Level of Conscious.: 0 1b LOC Questions: 2 1c LOC Commands: 0 2 Best Gaze: 0 3 Visual: 0 4 Facial Palsy: 1 5a Motor Arm - left: 0 5b Motor Arm - Right: 0 6a Motor Leg - Left: 1 6b Motor Leg - Right: 2 7 Limb Ataxia: 0 8 Sensory: 0 9 Best Language: 1 10 Dysarthria: 1 11 Extinct. and Inatten.: 0  TOTAL: 9   Premorbid mRS = 3   Labs   CBC:  Recent Labs  Lab 10/18/22 1342 10/19/22 0518  WBC 14.2* 12.1*  HGB 16.0* 15.2*  HCT 46.0 45.2  MCV 93.5 94.8  PLT 305 228    Basic Metabolic Panel:  Lab Results  Component Value Date   NA 137 10/19/2022   K 3.3 (L) 10/19/2022   CO2 23 10/19/2022   GLUCOSE 109 (H) 10/19/2022   BUN 29 (H) 10/19/2022   CREATININE 2.01 (H) 10/19/2022   CALCIUM 9.0 10/19/2022   GFRNONAA 25 (L) 10/19/2022   GFRAA 51 (L) 04/09/2019   Lipid Panel:  Lab Results  Component Value Date   LDLCALC 118 (H) 10/19/2022   HgbA1c:  Lab Results  Component Value Date   HGBA1C 5.9 (H) 10/04/2022   Urine Drug Screen:     Component Value Date/Time   LABOPIA NONE DETECTED 05/28/2022 0040   LABOPIA NONE DETECTED 02/26/2022 1353   COCAINSCRNUR NONE DETECTED 05/28/2022 0040   LABBENZ NONE DETECTED 05/28/2022  0040   LABBENZ NONE DETECTED 02/26/2022 1353   AMPHETMU NONE DETECTED 05/28/2022 0040   AMPHETMU NONE DETECTED 02/26/2022 1353   THCU NONE DETECTED 05/28/2022 0040   THCU POSITIVE  (A) 02/26/2022 1353   LABBARB NONE DETECTED 05/28/2022 0040   LABBARB NONE DETECTED 02/26/2022 1353    Alcohol Level     Component Value Date/Time   ETH <10 05/27/2022 2153     CT angio Head and Neck with contrast 02/26/22 1. No emergent large vessel occlusion. 2. 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 3. 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 4. Unchanged proximal left M1 occlusion with reconstitution but overall diminished caliber of the distal left MCA branches compared to the right. 5. Unchanged moderate stenosis of the right V4 segment and multifocal irregularity of the basilar artery which terminates at the superior cerebellar arteries. Fetal PCAs bilaterally are patent. 6. Unchanged moderate stenosis of the dominant right vertebral artery origin. 7. Calcified plaque at the carotid bifurcations resulting in approximately 50% stenosis on the right and 40-50% stenosis on the left.  MRI Brain 1. New acute to subacute infarcts in the periventricular white matter in the right frontal lobe and in the periatrial white matter on the left. 2. Unchanged chronic infarcts involving the right basal ganglia.  Imaging personally reviewed; I agree with above interpretations  Stroke Labs     Component Value Date/Time   CHOL 171 10/19/2022 0518   TRIG 85 10/19/2022 0518   HDL 36 (L) 10/19/2022 0518   CHOLHDL 4.8 10/19/2022 0518   VLDL 17 10/19/2022 0518   LDLCALC 118 (H) 10/19/2022 0518    Lab Results  Component Value Date/Time   HGBA1C 5.9 (H) 10/04/2022 01:41 PM     Impression   This is a 77 year old woman with past medical history significant for hypertension, multiple prior strokes, hyperlipidemia, CKD stage III, heart failure with preserved ejection fraction, hard of hearing who presented to the ED yesterday with generalized weakness.  MRI brain on admission showed new acute to  subacute infarcts in the periventricular white matter in the right frontal lobe and in the periatrial white matter on the left.  Given bilateral infarcts etiology is potentially embolic although she has severe intracranial stenosis which may have caused infarct in setting of known recurrent hypotension. Stroke workup is completed other than ambulatory cardiac monitoring.  Recommendations   - Permissive HTN x48 hrs from sx onset or until stroke ruled out by MRI goal BP <220/110. PRN labetalol or hydralazine if BP above these parameters. Avoid oral antihypertensives. - Atorvastatin 80mg  daily - ASA 81mg  daily + plavix 75mg  daily x90 days f/b plavix 75mg  daily monotherapy after that - q4 hr neuro checks - STAT head CT for any change in neuro exam - Tele - PT/OT/SLP - Stroke education - Amb referral to neurology upon discharge (I will arrange) - Ambulatory cardiac monitoring on discharge  Neurology will be available prn for questions going forward.  ______________________________________________________________________   Thank you for the opportunity to take part in the care of this patient. If you have any further questions, please contact the neurology consultation attending.  Signed,  Bing Neighbors, MD [facility] [phone]  If 7pm- 7am, please page neurology on call as listed in AMION.  **Any copied and pasted documentation in this note was written by me in another application not billed for and pasted by me into this document.

## 2022-10-19 NOTE — Evaluation (Signed)
Occupational Therapy Evaluation Patient Details Name: Victoria Gutierrez MRN: [id number] DOB: 1946/05/05 Today's Date: 10/19/2022   History of Present Illness This is a 77 y.o. female Victoria Gutierrez is a 77 y.o. female with medical history significant of hypertension, hyperlipidemia, HFpEF, CVA, CKD stage III, plaque psoriasis, hard of hearing, who presents to the ED due to increased generalized weakness.  Prior history noted in chart was obtained from the patient's daughter [name].  On speaking with the patient today she is not a very good historian.  Her only complaint is hip pain related to her fall back in December 2023.   Clinical Impression   Patient agreeable to OT evaluation. Daughter present. Patient presenting with decreased independence in self care, balance, functional mobility/transfers, endurance, and safety awareness. PTA pt received assistance for bathing, LB ADLs, and all IADLs. Pt has a PCA that comes M-F while daughter is at work. Pt currently functioning at Min-Mod A for LB dressing, Mod A for clothing management in standing, Min A for BSC transfer using a RW, and Min guard for bed mobility. Pt will benefit from skilled acute OT services to address deficits noted below. OT recommends ongoing therapy upon discharge to maximize safety and independence with ADLs, decrease fall risk, decrease caregiver burden, and promote return to PLOF.     Recommendations for follow up therapy are one component of a multi-disciplinary discharge planning process, led by the attending physician.  Recommendations may be updated based on patient status, additional functional criteria and insurance authorization.   Assistance Recommended at Discharge Frequent or constant Supervision/Assistance  Patient can return home with the following A little help with walking and/or transfers;A little help with bathing/dressing/bathroom;Assistance with cooking/housework;Assist for transportation;Help with stairs or ramp  for entrance;Direct supervision/assist for medications management;Direct supervision/assist for financial management    Functional Status Assessment  Patient has had a recent decline in their functional status and demonstrates the ability to make significant improvements in function in a reasonable and predictable amount of time.  Equipment Recommendations  None recommended by OT    Recommendations for Other Services       Precautions / Restrictions Precautions Precautions: Fall Restrictions Weight Bearing Restrictions: Yes RLE Weight Bearing: Weight bearing as tolerated      Mobility Bed Mobility Overal bed mobility: Needs Assistance Bed Mobility: Sit to Supine       Sit to supine: Min guard   General bed mobility comments: Min guard for safety, pt with interesting technique of getting back into bed (opting to "crawl" into bed instead of sitting at EOB first), Mod VC to adjust position of trunk and BLEs in bed (daughter reports pt requires VC at home as well)    Transfers Overall transfer level: Needs assistance Equipment used: Rolling walker (2 wheels) Transfers: Bed to chair/wheelchair/BSC   Stand pivot transfers: Min assist                Balance Overall balance assessment: Needs assistance Sitting-balance support: Feet supported Sitting balance-Leahy Scale: Good     Standing balance support: Bilateral upper extremity supported, Single extremity supported, During functional activity Standing balance-Leahy Scale: Fair       ADL either performed or assessed with clinical judgement   ADL Overall ADL's : Needs assistance/impaired       Lower Body Dressing: Minimal assistance;Moderate assistance;Sitting/lateral leans;Sit to/from stand Lower Body Dressing Details (indicate cue type and reason): Min A to thread BLEs through underwear while sitting on BSC, Mod A to hike in  standing  Toilet Transfer: Minimal assistance;BSC/3in1;Rolling walker (2  wheels);Stand-pivot;Cueing for safety;Cueing for sequencing Toilet Transfer Details (indicate cue type and reason): Pt received on BSC and reported already completing peri care in sitting. Min A for stand pivot transfer from BSC>EOB.  Toileting- Architect and Hygiene: Sit to/from stand;Moderate assistance Toileting - Clothing Manipulation Details (indicate cue type and reason): Mod A for clothing management in standing, pt requiring single UE support in standing       General ADL Comments: Daughter reports pt receives assistance for LB ADLs at baseline.     Vision Baseline Vision/History: 1 Wears glasses (readers only) Patient Visual Report: No change from baseline Additional Comments: Pt denied any recent vision changes, blurry vision, or diplopia.     Perception     Praxis      Pertinent Vitals/Pain Pain Assessment Pain Assessment: Faces Faces Pain Scale: Hurts a little bit Pain Location: R LE Pain Descriptors / Indicators: Aching Pain Intervention(s): Limited activity within patient's tolerance     Hand Dominance Right   Extremity/Trunk Assessment Upper Extremity Assessment Upper Extremity Assessment: Generalized weakness (FTN intact, LT sensation intact, LUE slightly weaker compared to R (grossly 4/5))   Lower Extremity Assessment Lower Extremity Assessment: Generalized weakness       Communication Communication Communication: HOH   Cognition Arousal/Alertness: Awake/alert Behavior During Therapy: Flat affect Overall Cognitive Status: Difficult to assess       General Comments: H/o previous CVAs. Oriented to self, year, able to identify daughter in room. Somewhat impulsive during mobility & required Max VC for safety awareness (attempting to get up without RW and before therapist had room set up safely despite VC to wait). Pt with minimal responses and allowing daughter to provide PLOF.     General Comments       Exercises Other Exercises Other  Exercises: OT provided education re: role of OT, OT POC, post acute recs, sitting up for all meals, EOB/OOB mobility with assistance, home/fall safety.     Shoulder Instructions      Home Living Family/patient expects to be discharged to:: Private residence Living Arrangements: Children (daughter) Available Help at Discharge: Family;Available 24 hours/day;Personal care attendant Type of Home: House Home Access: Stairs to enter [employer] of Steps: 5 Entrance Stairs-Rails: Left;Right;Can reach both Home Layout: One level     Bathroom Shower/Tub: Chief Strategy Officer: Standard     Home Equipment: Rollator (4 wheels);Rolling Walker (2 wheels);Shower seat;Other (comment);BSC/3in1 (adjustable bed)   Additional Comments: Hired caregiver during the day; daughter home at night      Prior Functioning/Environment Prior Level of Function : Needs assist;History of Falls (last six months)             Mobility Comments: Pt reports she is able to ambulate household distances using RW. Reports multiple falls including one yesterday ADLs Comments: Assist from daughter for ADLs (bathing, LB dressing) and IADLs (household chores, transportation, meals, med mgmt). IND for self-feeding. Wears depends at baseline.        OT Problem List: Decreased strength;Decreased activity tolerance;Impaired balance (sitting and/or standing);Decreased cognition;Decreased safety awareness;Decreased knowledge of use of DME or AE;Decreased knowledge of precautions      OT Treatment/Interventions: Self-care/ADL training;Therapeutic exercise;Energy conservation;DME and/or AE instruction;Therapeutic activities;Cognitive remediation/compensation;Patient/family education;Balance training    OT Goals(Current goals can be found in the care plan section) Acute Rehab OT Goals Patient Stated Goal: improve strength OT Goal Formulation: With patient/family Time For Goal Achievement:  11/02/22 Potential to Achieve  Goals: Fair   OT Frequency: Min 2X/week    Co-evaluation              AM-PAC OT "6 Clicks" Daily Activity     Outcome Measure Help from another person eating meals?: A Little Help from another person taking care of personal grooming?: A Little Help from another person toileting, which includes using toliet, bedpan, or urinal?: A Lot Help from another person bathing (including washing, rinsing, drying)?: A Lot Help from another person to put on and taking off regular upper body clothing?: A Little Help from another person to put on and taking off regular lower body clothing?: A Lot 6 Click Score: 15   End of Session Equipment Utilized During Treatment: Gait belt;Rolling walker (2 wheels) Nurse Communication: Mobility status  Activity Tolerance: Patient tolerated treatment well Patient left: in bed;with call bell/phone within reach;with bed alarm set;with family/visitor present  OT Visit Diagnosis: Other abnormalities of gait and mobility (R26.89);Muscle weakness (generalized) (M62.81)                Time: 1478-2956 OT Time Calculation (min): 23 min Charges:  OT General Charges $OT Visit: 1 Visit OT Evaluation $OT Eval Low Complexity: 1 Low  [facility] MS, OTR/L ascom [phone]  10/19/22, 6:05 PM

## 2022-10-19 NOTE — Progress Notes (Signed)
Full consult note to follow.   Imaging reviewed. R valgus impacted femoral neck fracture appears to be chronic on exam and MRI.   No plan for surgery during this admission. Will plan for outpatient arthroplasty.

## 2022-10-19 NOTE — Consult Note (Signed)
Hospital Consult    Reason for Consult:  Left Renal Atrophy with possible renal stenosis Requesting Physician:  Dr Gillis Santa MD  MRN #:  [id number]  History of Present Illness: This is a 77 y.o. female Victoria Gutierrez is a 77 y.o. female with medical history significant of hypertension, hyperlipidemia, HFpEF, CVA, CKD stage III, plaque psoriasis, hard of hearing, who presents to the ED due to increased generalized weakness.  Prior history noted in chart was obtained from the patient's daughter Victoria Gutierrez.  On speaking with the patient today she is not a very good historian.  Her only complaint is hip pain related to her fall back in December 2023.  Past Medical History:  Diagnosis Date   Arthritis    knees, hands   H/O thyroid disease    No issues currently   HOH (hard of hearing)    Hypertension    Stroke 12/2018   Wears hearing aid in both ears    has, does not wear    Past Surgical History:  Procedure Laterality Date   ABDOMINAL HYSTERECTOMY     CATARACT EXTRACTION W/PHACO Right 09/18/2019   Procedure: CATARACT EXTRACTION PHACO AND INTRAOCULAR LENS PLACEMENT (IOC) RIGHT 11.65  01:03.3;  Surgeon: Galen Manila, MD;  Location: [facility];  Service: Ophthalmology;  Laterality: Right;   CATARACT EXTRACTION W/PHACO Left 10/09/2019   Procedure: CATARACT EXTRACTION PHACO AND INTRAOCULAR LENS PLACEMENT (IOC) LEFT 10.30  00:48.5;  Surgeon: Galen Manila, MD;  Location: [facility];  Service: Ophthalmology;  Laterality: Left;   TOTAL HIP ARTHROPLASTY Left 04/05/2019   Procedure: TOTAL HIP ARTHROPLASTY ANTERIOR APPROACH;  Surgeon: Kennedy Bucker, MD;  Location: [facility] OR[facility];  Service: Orthopedics;  Laterality: Left;    No Known Allergies  Prior to Admission medications   Medication Sig Start Date End Date Taking? Authorizing Provider  aspirin EC 325 MG tablet Take 325 mg by mouth daily.   Yes [provider]  atorvastatin (LIPITOR) 80 MG tablet Take 1 tablet  (80 mg total) by mouth daily at 6 PM. 02/28/22  Yes Lonia Blood, MD  clopidogrel (PLAVIX) 75 MG tablet Take 1 tablet (75 mg total) by mouth daily. 03/01/22  Yes Lonia Blood, MD  lisinopril (ZESTRIL) 20 MG tablet Take 20 mg by mouth daily.   Yes [provider]  meclizine (ANTIVERT) 12.5 MG tablet Take 12.5 mg by mouth 3 (three) times daily as needed for dizziness. 10/07/22  Yes [provider]  amLODipine (NORVASC) 10 MG tablet Take 1 tablet (10 mg total) by mouth daily. Patient not taking: Reported on 10/18/2022 03/01/22   Lonia Blood, MD    Social History   Socioeconomic History   Marital status: Divorced    Spouse name: Not on file   Number of children: Not on file   Years of education: Not on file   Highest education level: Not on file  Occupational History   Not on file  Tobacco Use   Smoking status: Every Day    Packs/day: 0.50    Years: 38.00    Additional pack years: 0.00    Total pack years: 19.00    Types: Cigarettes    Start date: 03/10/1987   Smokeless tobacco: Never   Tobacco comments:    since age 78  Vaping Use   Vaping Use: Never used  Substance and Sexual Activity   Alcohol use: No   Drug use: No   Sexual activity: Not on file  Other Topics  Concern   Not on file  Social History Narrative   Not on file   Social Determinants of Health   Financial Resource Strain: Low Risk  (06/09/2017)   Overall Financial Resource Strain (CARDIA)    Difficulty of Paying Living Expenses: Not hard at all  Food Insecurity: No Food Insecurity (10/18/2022)   Hunger Vital Sign    Worried About Running Out of Food in the Last Year: Never true    Ran Out of Food in the Last Year: Never true  Transportation Needs: No Transportation Needs (10/18/2022)   PRAPARE - Administrator, Civil Service (Medical): No    Lack of Transportation (Non-Medical): No  Physical Activity: Unknown (06/09/2017)   Exercise Vital Sign    Days of Exercise per  Week: Patient declined    Minutes of Exercise per Session: Patient declined  Stress: No Stress Concern Present (06/09/2017)   [employer] of Occupational Health - Occupational Stress Questionnaire    Feeling of Stress : Not at all  Social Connections: Unknown (06/09/2017)   Social Connection and Isolation Panel [NHANES]    Frequency of Communication with Friends and Family: Patient declined    Frequency of Social Gatherings with Friends and Family: Patient declined    Attends Religious Services: Patient declined    Database administrator or Organizations: Patient declined    Attends Banker Meetings: Patient declined    Marital Status: Patient declined  Intimate Partner Violence: Not At Risk (10/18/2022)   Humiliation, Afraid, Rape, and Kick questionnaire    Fear of Current or Ex-Partner: No    Emotionally Abused: No    Physically Abused: No    Sexually Abused: No     Family History  Problem Relation Age of Onset   Heart disease Mother    Heart disease Father    Breast cancer Neg Hx     ROS: Otherwise negative unless mentioned in HPI  Physical Examination  Vitals:   10/19/22 0442 10/19/22 0740  BP: (!) 153/95 (!) 124/97  Pulse: 89 80  Resp: 18 18  Temp: 98.1 F (36.7 C) 97.7 F (36.5 C)  SpO2: 98% 97%   Body mass index is 25.14 kg/m.  General:  WDWN in NAD Gait: Not observed HENT: WNL, normocephalic Pulmonary: normal non-labored breathing, without Rales, rhonchi,  wheezing Cardiac: regular, without  Murmurs, rubs or gallops; without carotid bruits Abdomen: Positive bowel sounds, soft, NT/ND, no masses Skin: without rashes Vascular Exam/Pulses: Palpable Pulse throughout Extremities: without ischemic changes, without Gangrene , without cellulitis; without open wounds;  Musculoskeletal: no muscle wasting or atrophy  Neurologic: A&O X 3;  No focal weakness or paresthesias are detected; speech is fluent/normal Psychiatric:  The pt has Normal  affect. Lymph:  Unremarkable  CBC    Component Value Date/Time   WBC 12.1 (H) 10/19/2022 0518   RBC 4.77 10/19/2022 0518   HGB 15.2 (H) 10/19/2022 0518   HGB 14.8 08/11/2011 2203   HCT 45.2 10/19/2022 0518   HCT 43.8 08/11/2011 2203   PLT 228 10/19/2022 0518   PLT 235 08/11/2011 2203   MCV 94.8 10/19/2022 0518   MCV 100 08/11/2011 2203   MCH 31.9 10/19/2022 0518   MCHC 33.6 10/19/2022 0518   RDW 12.1 10/19/2022 0518   RDW 13.0 08/11/2011 2203   LYMPHSABS 1.9 05/27/2022 2153   MONOABS 1.0 05/27/2022 2153   EOSABS 0.1 05/27/2022 2153   BASOSABS 0.1 05/27/2022 2153    BMET  Component Value Date/Time   NA 137 10/19/2022 0518   NA 142 08/11/2011 2203   K 3.3 (L) 10/19/2022 0518   K 3.7 08/11/2011 2203   CL 103 10/19/2022 0518   CL 104 08/11/2011 2203   CO2 23 10/19/2022 0518   CO2 30 08/11/2011 2203   GLUCOSE 109 (H) 10/19/2022 0518   GLUCOSE 83 08/11/2011 2203   BUN 29 (H) 10/19/2022 0518   BUN 26 (H) 08/11/2011 2203   CREATININE 2.01 (H) 10/19/2022 0518   CREATININE 1.04 08/11/2011 2203   CALCIUM 9.0 10/19/2022 0518   CALCIUM 9.0 08/11/2011 2203   GFRNONAA 25 (L) 10/19/2022 0518   GFRNONAA 57 (L) 08/11/2011 2203   GFRAA 51 (L) 04/09/2019 0432   GFRAA >60 08/11/2011 2203    COAGS: Lab Results  Component Value Date   INR 1.1 05/27/2022   INR 1.0 02/26/2022   INR 1.0 01/20/2019     Non-Invasive Vascular Imaging:   EXAM: RENAL/URINARY TRACT ULTRASOUND   RENAL DUPLEX DOPPLER ULTRASOUND   COMPARISON:  CT renal stone protocol 10/18/2022   FINDINGS: Right Kidney:   Length: 11.0. Echogenicity within normal limits. No mass or hydronephrosis visualized.   Left Kidney:   Length: 8.0.  Diffusely echogenic, atrophic cortex.   Bladder:  Unremarkable   RENAL DUPLEX ULTRASOUND   Right Renal Artery Velocities:   Origin:  100 cm/sec   Mid:  76 cm/sec   Hilum:  78 cm/sec   Interlobar:  31 cm/sec   Arcuate:  70 cm/sec   Left Renal Artery:    Not visualized   Aortic Velocity:  57 cm/sec   Right Renal-Aortic Ratios:   Origin: 1.7   Mid:  1.3   Hilum: 1.4   Interlobar: 0.5   Arcuate: 1.2   Left Renal-Aortic Ratios:   Left renal artery not visualized   IMPRESSION: 1. No significant stenosis of the right main renal artery. 2. Nonvisualization of the left main renal artery or vein.  Statin:  Yes.   Beta Blocker:  No. Aspirin:  Yes.   ACEI:  No. ARB:  No. CCB use:  Yes Other antiplatelets/anticoagulants:  Yes.   Plavix 75 mg Daily   ASSESSMENT/PLAN: This is a 77 y.o. female presents to [facility] emergency department due to increased generalized weakness.  According to her daughter Victoria Gutierrez over the last 2 weeks Mr. Speights seems not to be quite herself.  Upon workup here in the hospital an MRI of her right femoral neck showed a fracture be subacute.  On the scan she was noted to have left kidney atrophy.  Today she was sent for renal duplex Doppler ultrasound.  The findings show normal right renal artery flow but no left renal artery was visualized with left renal atrophy at 8.0 cm.  This appears to show that the patient's left kidney has been shut down and has not been working properly for some time. Atrophy in a kidney of 8 centimeters or less indicates a nonfunctioning kidney.  Stenting the renal artery at this time would not improve left kidney status.  Therefore the patient appears to have only a right functioning kidney which now is showing a BUN of 29 and a creatinine of 2.01 today.  Vascular Surgery recommends a consult to Nephrology for possible hemodialysis and treatment of her right kidney.    -I discussed this case in detail with Dr. Levora Dredge MD and he is in agreement with the plan   Marcie [facility]  10/19/2022 2:54 PM

## 2022-10-19 NOTE — Evaluation (Addendum)
Clinical/Bedside Swallow Evaluation Patient Details  Name: Victoria Gutierrez MRN: [id number] Date of Birth: 04-Mar-1946  Today's Date: 10/19/2022 Time: SLP Start Time (ACUTE ONLY): 1255 SLP Stop Time (ACUTE ONLY): 1345 SLP Time Calculation (min) (ACUTE ONLY): 50 min  Past Medical History:  Past Medical History:  Diagnosis Date   Arthritis    knees, hands   H/O thyroid disease    No issues currently   HOH (hard of hearing)    Hypertension    Stroke 12/2018   Wears hearing aid in both ears    has, does not wear   Past Surgical History:  Past Surgical History:  Procedure Laterality Date   ABDOMINAL HYSTERECTOMY     CATARACT EXTRACTION W/PHACO Right 09/18/2019   Procedure: CATARACT EXTRACTION PHACO AND INTRAOCULAR LENS PLACEMENT (IOC) RIGHT 11.65  01:03.3;  Surgeon: Galen Manila, MD;  Location: [facility];  Service: Ophthalmology;  Laterality: Right;   CATARACT EXTRACTION W/PHACO Left 10/09/2019   Procedure: CATARACT EXTRACTION PHACO AND INTRAOCULAR LENS PLACEMENT (IOC) LEFT 10.30  00:48.5;  Surgeon: Galen Manila, MD;  Location: [facility];  Service: Ophthalmology;  Laterality: Left;   TOTAL HIP ARTHROPLASTY Left 04/05/2019   Procedure: TOTAL HIP ARTHROPLASTY ANTERIOR APPROACH;  Surgeon: Kennedy Bucker, MD;  Location: [facility];  Service: Orthopedics;  Laterality: Left;   HPI:  Pt is a 77 y.o. female with medical history significant of hypertension, hyperlipidemia, HFpEF, CVA w/ chronic L facial droop(slight), CKD stage III, plaque psoriasis, hard of hearing, who presents to the ED due to increased generalized weakness.  History predominantly obtained from patient's daughter at bedside, [name].  Patient is not a very good historian.   [name] states that for the last 2+ weeks, Ms. Finfrock has seemed not quite herself.  She has been experiencing increasing generalized weakness and has seemed more dazed.  She came in on 4/8 at which time she was admitted for UTI,  hypotension and possible TIA.  Since she has been at home, patient has continued to have episodes of generalized weakness with the most recent episode today.  After Ms. Lazcano was helped to the bathroom by the home health aide, she was unable to stand up on her own.  Her blood pressure was taken at that time and it was noted to be extremely low with systolics in the 70s.  At that time, her speech also seemed dysarthric and her daughter noted left-sided facial droop.  She notes that facial droop has been chronic since previous stroke but seems worse at this time.   Orthopedic surgery consulted d/t R femur fx, No plan for surgery during this admission. Will plan for outpatient arthroplasty.   Hip x-ray: Acute subcapital impacted fracture of the proximal right femur.     MRI: New acute to subacute infarcts in the periventricular white  matter in the right frontal lobe and in the periatrial white matter  on the left.  2. Unchanged chronic infarcts involving the right basal ganglia.   CXR: Ill-defined opacity within medial right lung base. This may  reflect atelectasis. However, pneumonia cannot excluded. Correlate.    Patient is assisted in cognitive ADLS at home as she lives with her daughter who manages medications, bills, driving etc.     Assessment / Plan / Recommendation  Clinical Impression   Pt seen for BSE. Pt awake, verbal and followed all directions w/ cue. Mild confusion/slow to resond intermittently during verbal responses; noted x2-3. Pt is mild+ HOH. Pt was able to read  from Menu to choose food items for Lunch meal w/ Daughter and this SLP. Wears reading glasses. Pt was alert to self/Dtr, hospital but not place, and year/month.  Pt appears to present w/ functional oropharyngeal phase swallow w/ No oropharyngeal phase dysphagia noted, No sensorimotor deficits noted. Pt consumed all po trials w/ No immediate, overt clinical s/s of aspiration during po trials. Pt appears at reduced risk for  aspiration following general aspiration precautions.  However, pt does have challenging factors that could impact oropharyngeal swallowing to include mild R femur fx discomfort during positioning(NSG aware), deconditioned/weakness, and acute stroke/hospitalization. These factors can increase risk for aspiration, dysphagia as well as decreased oral intake overall.   During po trials, pt consumed all consistencies w/ no overt coughing, decline in vocal quality, or change in respiratory presentation during/post trials. O2 sats remained upper 90s. Oral phase appeared grossly [facility] w/ timely bolus management, mastication, and control of bolus propulsion for A-P transfer for swallowing. Oral clearing achieved w/ all trial consistencies -- moistened, soft foods given.  OM Exam appeared [facility] w/ no overt unilateral weakness noted. Denture partial in place. Speech Clear, intelligible w/ min low volume intermittently. Pt fed self w/ setup support.   Recommend a Regular consistency diet w/ well-Cut meats, moistened foods for ease; Thin liquids -- carefully monitor straw use, and pt should help to Hold Cup when drinking. Recommend general aspiration precautions including sitting fully upright w/ all oral intake, reduce distractions during meals. Pills WHOLE in Puree for safer, easier swallowing IF needed.  Education given on Pills in Puree; food consistencies and easy to eat options; general aspiration precautions to pt and Dtr in room. NSG to reconsult if any new swallowing needs arise. NSG updated, agreed. MD updated. Recommend Dietician f/u for support. Pt and Dtr agreed.  ST services will f/u tomorrow w/ cognitive-linguistic assessment.  SLP Visit Diagnosis: Dysphagia, unspecified (R13.10)    Aspiration Risk   (reduced following precautions)    Diet Recommendation   a Regular consistency diet w/ well-Cut meats, moistened foods for ease; Thin liquids -- carefully monitor straw use, and pt should help to Hold Cup  when drinking. Recommend general aspiration precautions including sitting fully upright w/ all oral intake, reduce distractions during meals.  Medication Administration: Whole meds with liquid (vs Whole in Puree if needed)    Other  Recommendations Recommended Consults:  (Dietician f/u if needed) Oral Care Recommendations: Oral care BID;Oral care before and after PO;Patient independent with oral care (setup)    Recommendations for follow up therapy are one component of a multi-disciplinary discharge planning process, led by the attending physician.  Recommendations may be updated based on patient status, additional functional criteria and insurance authorization.  Follow up Recommendations No SLP follow up      Assistance Recommended at Discharge  Setup support  Functional Status Assessment Patient has not had a recent decline in their functional status  Frequency and Duration  (n/a)   (n/a)       Prognosis Prognosis for improved oropharyngeal function: Good Barriers to Reach Goals: Time post onset;Severity of deficits      Swallow Study   General Date of Onset: 10/18/22 HPI: Pt is a 77 y.o. female with medical history significant of hypertension, hyperlipidemia, HFpEF, CVA w/ chronic L facial droop(slight), CKD stage III, plaque psoriasis, hard of hearing, who presents to the ED due to increased generalized weakness.  History predominantly obtained from patient's daughter at bedside, [name].  Patient is not a very  good historian.   [name] states that for the last 2+ weeks, Ms. Mayol has seemed not quite herself.  She has been experiencing increasing generalized weakness and has seemed more dazed.  She came in on 4/8 at which time she was admitted for UTI, hypotension and possible TIA.  Since she has been at home, patient has continued to have episodes of generalized weakness with the most recent episode today.  After Ms. Birks was helped to the bathroom by the home health aide, she was  unable to stand up on her own.  Her blood pressure was taken at that time and it was noted to be extremely low with systolics in the 70s.  At that time, her speech also seemed dysarthric and her daughter noted left-sided facial droop.  She notes that facial droop has been chronic since previous stroke but seems worse at this time. Orthopedic surgery consulted d/t R femur fx, No plan for surgery during this admission. Will plan for outpatient arthroplasty.   Hip x-ray: Acute subcapital impacted fracture of the proximal right femur.   MRI: New acute to subacute infarcts in the periventricular white  matter in the right frontal lobe and in the periatrial white matter  on the left.  2. Unchanged chronic infarcts involving the right basal ganglia.   CXR: Ill-defined opacity within medial right lung base. This may  reflect atelectasis. However, pneumonia cannot excluded. Correlate.  Patient is assisted in cognitive ADLS at home as she lives with her daughter who manages medications, bills, driving etc. Type of Study: Bedside Swallow Evaluation Previous Swallow Assessment: 05/2022 - regular diet Diet Prior to this Study: NPO (regular diet at home) Temperature Spikes Noted: No (wbc 12.1) Respiratory Status: Room air History of Recent Intubation: No Behavior/Cognition: Alert;Cooperative;Pleasant mood;Distractible;Requires cueing (min) Oral Cavity Assessment: Within Functional Limits Oral Care Completed by SLP: Yes Oral Cavity - Dentition: Adequate natural dentition (w/ partial) Vision: Functional for self-feeding Self-Feeding Abilities: Able to feed self;Needs set up Patient Positioning: Upright in bed (needed min support sitting upright) Baseline Vocal Quality: Normal;Low vocal intensity Volitional Cough: Strong Volitional Swallow: Able to elicit    Oral/Motor/Sensory Function Overall Oral Motor/Sensory Function: Within functional limits   Ice Chips Ice chips: Within functional limits Presentation: Spoon  (fed; 2 trials)   Thin Liquid Thin Liquid: Within functional limits Presentation: Cup;Self Fed;Straw (~4 ozs total)    Nectar Thick Nectar Thick Liquid: Not tested   Honey Thick Honey Thick Liquid: Not tested   Puree Puree: Within functional limits Presentation: Self Fed;Spoon (~3-4 ozs)   Solid     Solid: Within functional limits Presentation: Self Fed (6+ trials)        Jerilynn Som, MS, CCC-SLP Speech Language Pathologist Rehab Services; [facility] [place] [phone] (ascom) Melonee Gerstel 10/19/2022,3:07 PM

## 2022-10-19 NOTE — Progress Notes (Signed)
[facility] Progress Note  Patient: Victoria Gutierrez    ZOX:[id number]  DOA: 10/18/2022     Date of Service: the patient was seen and examined on 10/19/2022  Chief Complaint  Patient presents with   Weakness   Brief hospital course: Victoria Gutierrez is a 77 y.o. female with medical history significant of hypertension, hyperlipidemia, HFpEF, CVA, CKD stage III, plaque psoriasis, hard of hearing, who presents to the ED due to increased generalized weakness.  History predominantly obtained from patient's daughter at bedside, Victoria Gutierrez.  Patient is not a very good historian.  Victoria Gutierrez states that for the last 2+ weeks, Victoria Gutierrez has seemed not quite herself.  She has been experiencing increasing generalized weakness and has seemed more dazed.  She came in on 4/8 at which time she was admitted for UTI, hypotension and possible TIA.  Since she has been at home, patient has continued to have episodes of generalized weakness with the most recent episode today.  After Victoria Gutierrez was helped to the bathroom by the home health aide, she was unable to stand up on her own.  Her blood pressure was taken at that time and it was noted to be extremely low with systolics in the 70s.  At that time, her speech also seemed dysarthric and her daughter noted left-sided facial droop.  She notes that facial droop has been chronic since previous stroke but seems worse at this time.   ED course: On arrival to the ED, patient was normotensive at 115/79 with heart rate of 88.  She was saturating at 97% on room air.  She was afebrile at 97.9.  Initial blood work notable for WBC of 14.2, hemoglobin 16.0, sodium of 137, potassium 2.9, bicarb 28, glucose 109, creatinine 0.91, BUN 21 with GFR of 27.  Urinalysis was obtained that had increased squamous epithelial and rare bacteria.  COVID-19 and influenza negative.  TSH minimally elevated and appropriate for age.  CT head was obtained with no acute intracranial abnormalities.  CT renal stone  study was obtained that demonstrated Interval development of marked left renal atrophy compared to 2019 concerning for renal artery stenosis.  In addition, there was abnormal appearance of the subcapital region of the right femoral neck.  Patient started on IV fluids and [facility] contacted for admission.   Assessment and Plan: Acute to Subacute CVA, bilateral:  MRI brain with acute to subacute infarcts in the right frontal lobe and left peri-atrial white matter. No prior history of A fib. Compliant with Aspirin and Plavix, with no missed doses.  Neurology consulted; appreciate their recommendations Carotid dopplers ordered given AKI, Moderate amount of bilateral atherosclerotic plaque, not resulting in a hemodynamically significant stenosis within either internal carotid artery. If clinical concern persists, further evaluation with CTA could be performed as indicated. TTE LVEF 65 to 70%, grade 1 diastolic dysfunction, no significant valvular abnormality, negative PFO LDL 118, goal <7, TSH 6.27 elevated, free T40.94 Within normal range Started aspirin 81 mg daily, Plavix 75 mg p.o. daily, Lipitor 80 mg p.o. daily Continue telemetry monitoring Allow for permissive HTN (systolic < 220 and diastolic < 120)   Right Femur Fracture: CT imaging confirmed femur fracture.  Discussed with Dr. Allena Katz; given difficulty discerning timing of fracture, would be beneficial to obtain plain x-ray with MRI to help delineate acuity.  Given patient remains symptomatic, she will very likely require repair and if MRI confirms acute fracture, may be able to repair tomorrow afternoon/evening, pending workup of additional medical  conditions listed in H&P. Orthopedic surgery consulted, No plan for surgery during this admission. Will plan for outpatient arthroplasty.  Hip x-ray: Acute subcapital impacted fracture of the proximal right femur.  MRI: Very limited examination due to patient motion. 2. Right femoral neck fracture appears  to be a subacute healing fracture with minimal residual surrounding marrow edema and small joint effusion. 3. Significant artifact related to a left hip prosthesis. No obvious complicating features are identified. 4. No significant intrapelvic abnormalities. Continue as needed medication for pain control, PT and OT eval, weightbearing as tolerated.  Follow-up with orthopedic surgery as an outpatient for definitive treatment. C/o feeling weak and tired, we will check B12 and vitamin D level  Hypokalemia, potassium repleted cautiously due to CKD Monitor electrolytes and replete as needed  AKI on CKD stage 3a Baseline creatinine 0.9 few months ago Creatinine 1.91 on admission Due to renal functions, avoid nephrotoxic medications Renal Duplex: No significant stenosis of the right main renal artery. Nonvisualization of the left main renal artery or vein. Vascular surgery consulted  Hypertension and chronic diastolic dysfunction Allow permissive hypertension due to acute CVA Monitor BP and titrate medications accordingly   Body mass index is 25.14 kg/m.  Interventions:       Diet: Heart healthy diet DVT Prophylaxis: Subcutaneous Lovenox   Advance goals of care discussion: Full code  Family Communication: family was present at bedside, at the time of interview.  The pt provided permission to discuss medical plan with the family. Opportunity was given to ask question and all questions were answered satisfactorily.   Disposition:  Pt is from Home, admitted with CVA, Aki and Right Hip Fracture , still has ambulatory dysfunction and AKI, which precludes a safe discharge. Discharge to SNF versus home with HH, TBD after PT/OT eval, when clinically stable, may need few days to optimize..  Subjective: Events overnight, patient denies any focal weakness or numbness, no chest pain or palpitation, no shortness of breath. Patient's daughter was at bedside, management plan discussed and all  question and concerns answered.  Physical Exam: General: NAD, lying comfortably Appear in no distress, affect appropriate Eyes: PERRLA ENT: Oral Mucosa Clear, moist  Neck: no JVD,  Cardiovascular: S1 and S2 Present, no Murmur,  Respiratory: good respiratory effort, Bilateral Air entry equal and Decreased, no Crackles, no wheezes Abdomen: Bowel Sound present, Soft and no tenderness,  Skin: no rashes Extremities: no Pedal edema, no calf tenderness Neurologic: without any new focal findings Gait not checked due to patient safety concerns  Vitals:   10/18/22 2200 10/18/22 2315 10/19/22 0442 10/19/22 0740  BP: (!) 137/92 121/78 (!) 153/95 (!) 124/97  Pulse: 88 87 89 80  Resp: 20 18 18 18   Temp:  98.2 F (36.8 C) 98.1 F (36.7 C) 97.7 F (36.5 C)  TempSrc:  Oral Oral   SpO2: 95% 95% 98% 97%  Weight:  72.8 kg    Height:  5\' 7"  (1.702 m)      Intake/Output Summary (Last 24 hours) at 10/19/2022 1431 Last data filed at 10/18/2022 1922 Gross per 24 hour  Intake 500 ml  Output --  Net 500 ml   Filed Weights   10/18/22 1925 10/18/22 2315  Weight: 72.6 kg 72.8 kg    Data Reviewed: I have personally reviewed and interpreted daily labs, tele strips, imagings as discussed above. I reviewed all nursing notes, pharmacy notes, vitals, pertinent old records I have discussed plan of care as described above with RN and patient/family.  CBC: Recent Labs  Lab 10/18/22 1342 10/19/22 0518  WBC 14.2* 12.1*  HGB 16.0* 15.2*  HCT 46.0 45.2  MCV 93.5 94.8  PLT 305 228   Basic Metabolic Panel: Recent Labs  Lab 10/18/22 1342 10/18/22 1600 10/19/22 0518  NA 137  --  137  K 2.9*  --  3.3*  CL 98  --  103  CO2 28  --  23  GLUCOSE 109*  --  109*  BUN 21  --  29*  CREATININE 1.91*  --  2.01*  CALCIUM 9.5  --  9.0  MG  --  2.4 2.2  PHOS  --   --  4.3    Studies: ECHOCARDIOGRAM COMPLETE  Result Date: 10/19/2022    ECHOCARDIOGRAM REPORT   Patient Name:   Herb Grays Date of  Exam: 10/19/2022 Medical Rec #:  [id number]      Height:       67.0 in Accession #:    [id number]     Weight:       160.5 lb Date of Birth:  03-Dec-1945      BSA:          1.842 m Patient Age:    76 years       BP:           153/95 mmHg Patient Gender: F              HR:           89 bpm. Exam Location:  [facility] Procedure: 2D Echo, Cardiac Doppler and Color Doppler Indications:     Stroke I63.9  History:         Patient has prior history of Echocardiogram examinations, most                  recent 02/27/2022. Stroke; Risk Factors:Hypertension.  Sonographer:     Cristela Blue Referring Phys:  [id number] Verdene Lennert Diagnosing Phys: Arvilla Meres MD IMPRESSIONS  1. Left ventricular ejection fraction, by estimation, is 65 to 70%. The left ventricle has normal function. The left ventricle has no regional wall motion abnormalities. There is severe concentric left ventricular hypertrophy. Left ventricular diastolic  parameters are consistent with Grade I diastolic dysfunction (impaired relaxation).  2. Right ventricular systolic function is normal. The right ventricular size is normal.  3. The mitral valve is normal in structure. Trivial mitral valve regurgitation. No evidence of mitral stenosis.  4. The aortic valve is tricuspid. There is mild calcification of the aortic valve. Aortic valve regurgitation is not visualized. Aortic valve sclerosis/calcification is present, without any evidence of aortic stenosis. Aortic valve area, by VTI measures  3.79 cm. Aortic valve mean gradient measures 3.0 mmHg. Aortic valve Vmax measures 1.14 m/s. FINDINGS  Left Ventricle: Left ventricular ejection fraction, by estimation, is 65 to 70%. The left ventricle has normal function. The left ventricle has no regional wall motion abnormalities. The left ventricular internal cavity size was normal in size. There is  severe concentric left ventricular hypertrophy. Left ventricular diastolic parameters are consistent with Grade I diastolic  dysfunction (impaired relaxation). Right Ventricle: The right ventricular size is normal. No increase in right ventricular wall thickness. Right ventricular systolic function is normal. Left Atrium: Left atrial size was normal in size. Right Atrium: Right atrial size was normal in size. Pericardium: There is no evidence of pericardial effusion. Mitral Valve: The mitral valve is normal in structure. There is mild calcification of the mitral valve leaflet(s). Trivial  mitral valve regurgitation. No evidence of mitral valve stenosis. MV peak gradient, 3.6 mmHg. The mean mitral valve gradient is 2.0  mmHg. Tricuspid Valve: The tricuspid valve is normal in structure. Tricuspid valve regurgitation is trivial. No evidence of tricuspid stenosis. Aortic Valve: The aortic valve is tricuspid. There is mild calcification of the aortic valve. Aortic valve regurgitation is not visualized. Aortic valve sclerosis/calcification is present, without any evidence of aortic stenosis. Aortic valve mean gradient measures 3.0 mmHg. Aortic valve peak gradient measures 5.2 mmHg. Aortic valve area, by VTI measures 3.79 cm. Pulmonic Valve: The pulmonic valve was normal in structure. Pulmonic valve regurgitation is not visualized. No evidence of pulmonic stenosis. Aorta: The aortic root is normal in size and structure. Venous: The inferior vena cava was not well visualized. IAS/Shunts: No atrial level shunt detected by color flow Doppler.  LEFT VENTRICLE PLAX 2D LVIDd:         3.60 cm   Diastology LVIDs:         2.30 cm   LV e' medial:    3.59 cm/s LV PW:         1.60 cm   LV E/e' medial:  18.1 LV IVS:        1.80 cm   LV e' lateral:   4.13 cm/s LVOT diam:     2.00 cm   LV E/e' lateral: 15.7 LV SV:         56 LV SV Index:   31 LVOT Area:     3.14 cm  RIGHT VENTRICLE RV Basal diam:  3.60 cm RV Mid diam:    2.60 cm RV S prime:     10.70 cm/s TAPSE (M-mode): 2.0 cm LEFT ATRIUM             Index        RIGHT ATRIUM           Index LA diam:         2.40 cm 1.30 cm/m   RA Area:     12.20 cm LA Vol (A2C):   26.4 ml 14.33 ml/m  RA Volume:   27.00 ml  14.66 ml/m LA Vol (A4C):   46.0 ml 24.98 ml/m LA Biplane Vol: 38.5 ml 20.90 ml/m  AORTIC VALVE AV Area (Vmax):    3.16 cm AV Area (Vmean):   3.35 cm AV Area (VTI):     3.79 cm AV Vmax:           114.50 cm/s AV Vmean:          79.600 cm/s AV VTI:            0.149 m AV Peak Grad:      5.2 mmHg AV Mean Grad:      3.0 mmHg LVOT Vmax:         115.00 cm/s LVOT Vmean:        85.000 cm/s LVOT VTI:          0.179 m LVOT/AV VTI ratio: 1.21  AORTA Ao Root diam: 3.10 cm MITRAL VALVE               TRICUSPID VALVE MV Area (PHT): 4.29 cm    TR Peak grad:   11.8 mmHg MV Area VTI:   2.81 cm    TR Vmax:        172.00 cm/s MV Peak grad:  3.6 mmHg MV Mean grad:  2.0 mmHg    SHUNTS MV Vmax:  0.95 m/s    Systemic VTI:  0.18 m MV Vmean:      62.5 cm/s   Systemic Diam: 2.00 cm MV Decel Time: 177 msec MV E velocity: 65.00 cm/s MV A velocity: 95.90 cm/s MV E/A ratio:  0.68 Arvilla Meres MD Electronically signed by Arvilla Meres MD Signature Date/Time: 10/19/2022/1:20:02 PM    Final    US RENAL ARTERY DUPLEX COMPLETE  Result Date: 10/19/2022 CLINICAL DATA:  Left renal atrophy EXAM: RENAL/URINARY TRACT ULTRASOUND RENAL DUPLEX DOPPLER ULTRASOUND COMPARISON:  CT renal stone protocol 10/18/2022 FINDINGS: Right Kidney: Length: 11.0. Echogenicity within normal limits. No mass or hydronephrosis visualized. Left Kidney: Length: 8.0.  Diffusely echogenic, atrophic cortex. Bladder:  Unremarkable RENAL DUPLEX ULTRASOUND Right Renal Artery Velocities: Origin:  100 cm/sec Mid:  76 cm/sec Hilum:  78 cm/sec Interlobar:  31 cm/sec Arcuate:  70 cm/sec Left Renal Artery: Not visualized Aortic Velocity:  57 cm/sec Right Renal-Aortic Ratios: Origin: 1.7 Mid:  1.3 Hilum: 1.4 Interlobar: 0.5 Arcuate: 1.2 Left Renal-Aortic Ratios: Left renal artery not visualized IMPRESSION: 1. No significant stenosis of the right main renal artery. 2.  Nonvisualization of the left main renal artery or vein. Electronically Signed   By: Acquanetta Belling M.D.   On: 10/19/2022 13:01   [place] Carotid Bilateral (at [facility] and [facility] only)  Result Date: 10/19/2022 CLINICAL DATA:  CVA.  History of hypertension and smoking. EXAM: BILATERAL CAROTID DUPLEX ULTRASOUND TECHNIQUE: Wallace Cullens scale imaging, color Doppler and duplex ultrasound were performed of bilateral carotid and vertebral arteries in the neck. COMPARISON:  None Available. FINDINGS: Criteria: Quantification of carotid stenosis is based on velocity parameters that correlate the residual internal carotid diameter with NASCET-based stenosis levels, using the diameter of the distal internal carotid lumen as the denominator for stenosis measurement. The following velocity measurements were obtained: RIGHT ICA: 57/13 cm/sec CCA: 62/19 cm/sec SYSTOLIC ICA/CCA RATIO:  0.9 ECA: 128 cm/sec LEFT ICA: 76/15 cm/sec CCA: 63/15 cm/sec SYSTOLIC ICA/CCA RATIO:  1.2 ECA: 104 cm/sec RIGHT CAROTID ARTERY: There is a minimal amount of intimal wall thickening and scattered eccentric echogenic plaque throughout the right common carotid artery (representative image 10). There is a moderate amount of eccentric echogenic plaque within the right carotid bulb (images 5 and 20), extending to involve the origin and proximal aspects of the right internal carotid artery (image 28), not resulting in elevated peak systolic velocities within the interrogated course of the right internal carotid artery to suggest a hemodynamically significant stenosis. RIGHT VERTEBRAL ARTERY:  Antegrade flow LEFT CAROTID ARTERY: There is a minimal amount of intimal thickening throughout the left common carotid artery. There is a moderate amount of eccentric echogenic plaque within left carotid bulb (images 42 and 58), extending to involve the origin and proximal aspects of the left internal carotid artery (image 66), not resulting in elevated peak systolic velocities within the  interrogated course of the left internal carotid artery to suggest a hemodynamically significant stenosis. LEFT VERTEBRAL ARTERY:  Antegrade flow IMPRESSION: Moderate amount of bilateral atherosclerotic plaque, not resulting in a hemodynamically significant stenosis within either internal carotid artery. If clinical concern persists, further evaluation with CTA could be performed as indicated. Electronically Signed   By: Simonne Come M.D.   On: 10/19/2022 12:48   MR HIP RIGHT WO CONTRAST  Result Date: 10/19/2022 CLINICAL DATA:  Right hip pain. EXAM: MR OF THE RIGHT HIP WITHOUT CONTRAST TECHNIQUE: Multiplanar, multisequence MR imaging was performed. No intravenous contrast was administered. COMPARISON:  CT scan, same date  FINDINGS: Very limited examination due to patient motion. As demonstrated on the CT scan there is a right femoral neck fracture. This appears to be a subacute healing fracture with minimal residual surrounding marrow edema and small joint effusion. The femoral head is normally located. No AVN. Significant artifact related to a left hip prosthesis. No obvious complicating features are identified. The pubic symphysis and SI joints are intact. No pelvic fractures bone lesions. The right hip musculature is grossly normal. No muscle tear or intramuscular hematoma. No significant peritrochanteric tendinosis or bursitis. No significant intrapelvic abnormalities are identified. IMPRESSION: 1. Very limited examination due to patient motion. 2. Right femoral neck fracture appears to be a subacute healing fracture with minimal residual surrounding marrow edema and small joint effusion. 3. Significant artifact related to a left hip prosthesis. No obvious complicating features are identified. 4. No significant intrapelvic abnormalities. Electronically Signed   By: Rudie Meyer M.D.   On: 10/19/2022 08:34   DG HIP UNILAT WITH PELVIS 2-3 VIEWS RIGHT  Result Date: 10/18/2022 CLINICAL DATA:  Closed right hip  fracture. EXAM: DG HIP (WITH OR WITHOUT PELVIS) 2-3V RIGHT COMPARISON:  CT 10/18/2022 FINDINGS: Subcapital fracture of the right proximal femur. Sclerosis consistent with impaction of the fracture but could indicate early healing although no callus formation is identified. No evidence of inter trochanteric involvement. Mild valgus angulation. Degenerative changes in the hip joint. No dislocation. Previous left hip arthroplasty with components appearing well seated. IMPRESSION: Acute subcapital impacted fracture of the proximal right femur. Electronically Signed   By: Burman Nieves M.D.   On: 10/18/2022 22:12   CT HIP RIGHT WO CONTRAST  Result Date: 10/18/2022 CLINICAL DATA:  Hip pain with stress fracture suspected. Negative x-ray. Fell today. EXAM: CT OF THE RIGHT HIP WITHOUT CONTRAST TECHNIQUE: Multidetector CT imaging of the right hip was performed according to the standard protocol. Multiplanar CT image reconstructions were also generated. RADIATION DOSE REDUCTION: This exam was performed according to the departmental dose-optimization program which includes automated exposure control, adjustment of the mA and/or kV according to patient size and/or use of iterative reconstruction technique. COMPARISON:  CT abdomen and pelvis 10/18/2022 FINDINGS: Bones/Joint/Cartilage Acute transverse fracture of the subcapital region of the left proximal femur with impaction of the fracture fragments and valgus alignment of the hip. Sclerosis in the superior femoral head may represent bone contusion, degenerative change, or early avascular necrosis. No dislocation at the hip joint. No destructive or expansile bone lesions. Ligaments Suboptimally assessed by CT. Muscles and Tendons No intramuscular hematoma or focal mass lesion. Soft tissues Calcification in the femoral artery. No significant soft tissue collection. IMPRESSION: Acute subcapital fracture of the proximal right femur with impaction of fracture fragments and  valgus angulation. Sclerosis in the femoral head could be related to bone contusion, degenerative change, or avascular necrosis. Electronically Signed   By: Burman Nieves M.D.   On: 10/18/2022 20:12   MR BRAIN WO CONTRAST  Result Date: 10/18/2022 CLINICAL DATA:  Altered mental status EXAM: MRI HEAD WITHOUT CONTRAST TECHNIQUE: Multiplanar, multiecho pulse sequences of the brain and surrounding structures were obtained without intravenous contrast. COMPARISON:  MRI Head 10/04/22 FINDINGS: Brain: Compared to prior exam there are new infarcts in the periventricular white matter in the right frontal lobe (series 5, image 24) and in the periatrial white matter on the left (series 5, image 29). Redemonstrated there is a chronic infarct in the right basal ganglia with linear regions of hyperintense signal on diffusion-weighted imaging, unchanged. No  new sites of hemorrhage. No extra-axial fluid collection. Sequela of severe chronic microvascular ischemic change. No hydrocephalus. Vascular: Normal flow voids. Skull and upper cervical spine: Normal marrow signal. Sinuses/Orbits: Bilateral lens replacement. No mastoid or middle ear effusion. Polypoid mucosal thickening left maxillary sinus. Other: None. IMPRESSION: 1. New acute to subacute infarcts in the periventricular white matter in the right frontal lobe and in the periatrial white matter on the left. 2. Unchanged chronic infarcts involving the right basal ganglia. Electronically Signed   By: Lorenza Cambridge M.D.   On: 10/18/2022 20:04   CT Renal Stone Study  Result Date: 10/18/2022 CLINICAL DATA:  Abdominal and flank pain, acute renal insufficiency EXAM: CT ABDOMEN AND PELVIS WITHOUT CONTRAST TECHNIQUE: Multidetector CT imaging of the abdomen and pelvis was performed following the standard protocol without IV contrast. Unenhanced CT was performed per clinician order. Lack of IV contrast limits sensitivity and specificity, especially for evaluation of  abdominal/pelvic solid viscera. RADIATION DOSE REDUCTION: This exam was performed according to the departmental dose-optimization program which includes automated exposure control, adjustment of the mA and/or kV according to patient size and/or use of iterative reconstruction technique. COMPARISON:  03/18/2018 FINDINGS: Lower chest: No acute pleural or parenchymal lung disease. Hepatobiliary: Unremarkable unenhanced appearance of the liver and gallbladder. Pancreas: Unremarkable unenhanced appearance. Spleen: Unremarkable unenhanced appearance. Adrenals/Urinary Tract: Interval development of severe left renal atrophy. Stable unenhanced appearance of the right kidney. No urinary tract calculi or obstructive uropathy. Bladder is decompressed, limiting its evaluation. The adrenals are unremarkable. Stomach/Bowel: No bowel obstruction or ileus. Normal appendix right lower quadrant. Distal colonic diverticulosis without diverticulitis. No bowel wall thickening or inflammatory change. Vascular/Lymphatic: Diffuse atherosclerosis of the aorta and its branches. Evaluation of the aortic lumen is limited without IV contrast. There is atherosclerosis of the bilateral renal arteries, and if left renal artery stenosis is suspected given asymmetric left renal atrophy renal Doppler ultrasound could be considered. No pathologic adenopathy within the abdomen or pelvis. Reproductive: Status post hysterectomy. No adnexal masses. Other: No free fluid or free intraperitoneal gas. No abdominal wall hernia. Musculoskeletal: Left hip arthroplasty. There is sclerosis in the subcapital region of the right femoral neck, with possible subtle trabecular disruption on the coronal and axial images. Findings could reflect an occult subcapital right femoral neck fracture or stress/insufficiency fracture. If there is pain referable to this region, MRI may be useful. No other acute bony abnormalities. Reconstructed images demonstrate no additional  findings. IMPRESSION: 1. Interval development of marked left renal atrophy. Given the associated atherosclerosis of the aorta and its branches, left renal artery stenosis is suspected. Further evaluation with renal Doppler ultrasound may be useful. 2. Abnormal appearance of the subcapital region of the right femoral neck, which may reflect sequela of occult subcapital femoral neck fracture or insufficiency fracture. If pain is referable to this region, MRI may be useful. 3. Distal colonic diverticulosis without diverticulitis. 4.  Aortic Atherosclerosis (ICD10-I70.0). Electronically Signed   By: Sharlet Salina M.D.   On: 10/18/2022 16:52   CT Head Wo Contrast  Result Date: 10/18/2022 CLINICAL DATA:  Head trauma history of strokes EXAM: CT HEAD WITHOUT CONTRAST TECHNIQUE: Contiguous axial images were obtained from the base of the skull through the vertex without intravenous contrast. RADIATION DOSE REDUCTION: This exam was performed according to the departmental dose-optimization program which includes automated exposure control, adjustment of the mA and/or kV according to patient size and/or use of iterative reconstruction technique. COMPARISON:  10/04/2022 FINDINGS: Brain: No evidence of acute  infarction, hemorrhage, hydrocephalus, extra-axial collection or mass lesion/mass effect. Extensive periventricular and deep white matter hypodensity, similar to prior examination. Vascular: No hyperdense vessel or unexpected calcification. Skull: Normal. Negative for fracture or focal lesion. Sinuses/Orbits: No acute finding. Other: None. IMPRESSION: No acute intracranial pathology. Advanced small-vessel white matter disease, unchanged compared to prior examination. Consider MRI to more sensitively evaluate for acute diffusion restricting infarction if suspected. Electronically Signed   By: Jearld Lesch M.D.   On: 10/18/2022 16:47   DG Chest Portable 1 View  Result Date: 10/18/2022 CLINICAL DATA:  Provided history:  Weakness. EXAM: PORTABLE CHEST 1 VIEW COMPARISON:  Prior chest radiographs 10/04/2022 and earlier. FINDINGS: Heart size at the upper limits of normal. Aortic atherosclerosis. Ill-defined opacity within the medial right lung base. No appreciable airspace consolidation on the left. No evidence of pleural effusion or pneumothorax. No acute osseous abnormality identified. Degenerative changes of the spine. Mild levocurvature of the lower thoracic spine. IMPRESSION: 1. Ill-defined opacity within medial right lung base. This may reflect atelectasis. However, pneumonia cannot excluded. Correlate clinically consider short-interval radiographic follow-up. 2.  Aortic Atherosclerosis (ICD10-I70.0). Electronically Signed   By: Jackey Loge D.O.   On: 10/18/2022 16:32    Scheduled Meds:  aspirin EC  81 mg Oral Daily   atorvastatin  80 mg Oral q1800   clopidogrel  75 mg Oral Daily   enoxaparin (LOVENOX) injection  30 mg Subcutaneous Q24H   sodium chloride flush  3 mL Intravenous Q12H   Continuous Infusions: PRN Meds: acetaminophen **OR** acetaminophen, ondansetron **OR** ondansetron (ZOFRAN) IV, polyethylene glycol  Time spent: 50 minutes  Author: Gillis Santa. MD [facility] 10/19/2022 2:31 PM  To reach On-call, see care teams to locate the attending and reach out to them via [email or web address]. If 7PM-7AM, please contact night-coverage If you still have difficulty reaching the attending provider, please page the [facility] (Director on Call) for [facility] on amion for assistance.

## 2022-10-19 NOTE — Evaluation (Signed)
Physical Therapy Evaluation Patient Details Name: Victoria R JCHERRYL [id number] DOB: 07-28-1945 Today's Date: 10/19/2022  History of Present Illness  This is a 77 y.o. female Victoria Gutierrez is a 77 y.o. female with medical history significant of hypertension, hyperlipidemia, HFpEF, CVA, CKD stage III, plaque psoriasis, hard of hearing, who presents to the ED due to increased generalized weakness.  Prior history noted in chart was obtained from the patient's daughter [name].  On speaking with the patient today she is not a very good historian.  Her only complaint is hip pain related to her fall back in December 2023.  Clinical Impression  Pt is a pleasant 77 year old female who was admitted for AKI. Pt performs bed mobility, transfers, and ambulation with min assist and RW. Pt demonstrates deficits with strength/mobility/balance. Would benefit from skilled PT to address above deficits and promote optimal return to PLOF. Pt will continue to receive skilled PT services while admitted and will defer to TOC/care team for updates regarding disposition planning.       Recommendations for follow up therapy are one component of a multi-disciplinary discharge planning process, led by the attending physician.  Recommendations may be updated based on patient status, additional functional criteria and insurance authorization.  Follow Up Recommendations       Assistance Recommended at Discharge Frequent or constant Supervision/Assistance  Patient can return home with the following  A little help with bathing/dressing/bathroom;A little help with walking and/or transfers    Equipment Recommendations None recommended by PT  Recommendations for Other Services       Functional Status Assessment Patient has had a recent decline in their functional status and demonstrates the ability to make significant improvements in function in a reasonable and predictable amount of time.     Precautions / Restrictions  Precautions Precautions: Fall Restrictions Weight Bearing Restrictions: Yes RLE Weight Bearing: Weight bearing as tolerated      Mobility  Bed Mobility Overal bed mobility: Needs Assistance Bed Mobility: Supine to Sit, Sit to Supine     Supine to sit: Min assist     General bed mobility comments: follows commands. Does require assist for trunkal elevation    Transfers Overall transfer level: Needs assistance Equipment used: Rolling walker (2 wheels) Transfers: Bed to chair/wheelchair/BSC   Stand pivot transfers: Min assist         General transfer comment: Follows commands well. Able to SPT to chair    Ambulation/Gait               General Gait Details: deferred as pt wanting to eat lunch in [employer]            Wheelchair Mobility    Modified Rankin (Stroke Patients Only)       Balance Overall balance assessment: Needs assistance Sitting-balance support: Feet supported Sitting balance-Leahy Scale: Good     Standing balance support: Bilateral upper extremity supported Standing balance-Leahy Scale: Good                               Pertinent Vitals/Pain Pain Assessment Pain Assessment: Faces Faces Pain Scale: Hurts little more Pain Location: R LE Pain Descriptors / Indicators: Aching Pain Intervention(s): Limited activity within patient's tolerance    Home Living Family/patient expects to be discharged to:: Private residence Living Arrangements: Children Available Help at Discharge: Family;Available 24 hours/day Type of Home: House Home Access: Stairs to enter Entrance Stairs-Rails:  Left;Right;Can reach both Entrance Stairs-Number of Steps: 5   Home Layout: One level Home Equipment: Rollator (4 wheels) Additional Comments: Hired caregiver during the day; daughter home at night    Prior Function Prior Level of Function : Needs assist             Mobility Comments: reports she is able to household distances  using RW. Reports multiple falls including one yesterday ADLs Comments: Assist from daughter for ADLs, household chores, meals and meds     Hand Dominance        Extremity/Trunk Assessment   Upper Extremity Assessment Upper Extremity Assessment: Overall WFL for tasks assessed    Lower Extremity Assessment Lower Extremity Assessment: Generalized weakness (R LE grossly 4/5)       Communication   Communication: HOH  Cognition Arousal/Alertness: Awake/alert Behavior During Therapy: Flat affect Overall Cognitive Status: Within Functional Limits for tasks assessed                                 General Comments: pleasant, follows commands        General Comments      Exercises     Assessment/Plan    PT Assessment Patient needs continued PT services  PT Problem List Decreased strength;Decreased activity tolerance;Decreased balance;Decreased mobility;Decreased cognition;Decreased knowledge of use of DME;Decreased coordination;Decreased safety awareness;Decreased knowledge of precautions       PT Treatment Interventions DME instruction;Gait training;Stair training;Therapeutic exercise;Therapeutic activities;Functional mobility training;Balance training;Neuromuscular re-education;Cognitive remediation;Patient/family education    PT Goals (Current goals can be found in the Care Plan section)  Acute Rehab PT Goals Patient Stated Goal: to get her strength back PT Goal Formulation: With patient Time For Goal Achievement: 11/02/22 Potential to Achieve Goals: Fair    Frequency Min 3X/week     Co-evaluation               AM-PAC PT "6 Clicks" Mobility  Outcome Measure Help needed turning from your back to your side while in a flat bed without using bedrails?: A Little Help needed moving from lying on your back to sitting on the side of a flat bed without using bedrails?: A Little Help needed moving to and from a bed to a chair (including a  wheelchair)?: A Little Help needed standing up from a chair using your arms (e.g., wheelchair or bedside chair)?: A Little Help needed to walk in hospital room?: A Lot Help needed climbing 3-5 steps with a railing? : A Lot 6 Click Score: 16    End of Session Equipment Utilized During Treatment: Gait belt Activity Tolerance: Patient tolerated treatment well Patient left: in chair;with chair alarm set Nurse Communication: Mobility status PT Visit Diagnosis: Difficulty in walking, not elsewhere classified (R26.2);Muscle weakness (generalized) (M62.81)    Time: 4098-1191 PT Time Calculation (min) (ACUTE ONLY): 13 min   Charges:   PT Evaluation $PT Eval Low Complexity: 1 Low          Elizabeth Palau, PT, DPT, GCS [phone]   Orbin Mayeux 10/19/2022, 5:23 PM

## 2022-10-20 ENCOUNTER — Inpatient Hospital Stay (HOSPITAL_COMMUNITY)
Admit: 2022-10-20 | Discharge: 2022-10-20 | Disposition: A | Discharge status: Home / Self Care | Payer: Medicare HMO | Attending: Nurse Practitioner | Admitting: Nurse Practitioner

## 2022-10-20 DIAGNOSIS — N261 Atrophy of kidney (terminal): Secondary | ICD-10-CM | POA: Diagnosis not present

## 2022-10-20 DIAGNOSIS — I639 Cerebral infarction, unspecified: Secondary | ICD-10-CM

## 2022-10-20 DIAGNOSIS — S72001A Fracture of unspecified part of neck of right femur, initial encounter for closed fracture: Secondary | ICD-10-CM | POA: Diagnosis not present

## 2022-10-20 DIAGNOSIS — F17210 Nicotine dependence, cigarettes, uncomplicated: Secondary | ICD-10-CM | POA: Diagnosis not present

## 2022-10-20 DIAGNOSIS — N179 Acute kidney failure, unspecified: Secondary | ICD-10-CM | POA: Diagnosis not present

## 2022-10-20 LAB — CBC
HCT: 43.8 % (ref 36.0–46.0)
Hemoglobin: 14.7 g/dL (ref 12.0–15.0)
MCH: 32 pg (ref 26.0–34.0)
MCHC: 33.6 g/dL (ref 30.0–36.0)
MCV: 95.2 fL (ref 80.0–100.0)
Platelets: 243 10*3/uL (ref 150–400)
RBC: 4.6 MIL/uL (ref 3.87–5.11)
RDW: 12 % (ref 11.5–15.5)
WBC: 8.8 10*3/uL (ref 4.0–10.5)
nRBC: 0 % (ref 0.0–0.2)

## 2022-10-20 LAB — BASIC METABOLIC PANEL
Anion gap: 7 (ref 5–15)
BUN: 33 mg/dL — ABNORMAL HIGH (ref 8–23)
CO2: 27 mmol/L (ref 22–32)
Calcium: 9.2 mg/dL (ref 8.9–10.3)
Chloride: 105 mmol/L (ref 98–111)
Creatinine, Ser: 1.28 mg/dL — ABNORMAL HIGH (ref 0.44–1.00)
GFR, Estimated: 43 mL/min — ABNORMAL LOW (ref >60)
Glucose, Bld: 111 mg/dL — ABNORMAL HIGH (ref 70–99)
Potassium: 3.5 mmol/L (ref 3.5–5.1)
Sodium: 139 mmol/L (ref 135–145)

## 2022-10-20 LAB — MAGNESIUM: Magnesium: 2.2 mg/dL (ref 1.7–2.4)

## 2022-10-20 LAB — PHOSPHORUS: Phosphorus: 3 mg/dL (ref 2.5–4.6)

## 2022-10-20 LAB — VITAMIN B12: Vitamin B-12: 167 pg/mL — ABNORMAL LOW (ref 180–914)

## 2022-10-20 LAB — VITAMIN D 25 HYDROXY (VIT D DEFICIENCY, FRACTURES): Vit D, 25-Hydroxy: 28.48 ng/mL — ABNORMAL LOW (ref 30–100)

## 2022-10-20 MED ORDER — VITAMIN D (ERGOCALCIFEROL) 1.25 MG (50000 UNIT) PO CAPS: 50000.0000 [IU] | ORAL_CAPSULE | ORAL | 0 refills | Status: AC

## 2022-10-20 MED ORDER — AMLODIPINE BESYLATE 5 MG PO TABS: 10.0000 mg | ORAL_TABLET | Freq: Every evening | ORAL | 11 refills | Status: AC | PRN

## 2022-10-20 MED ORDER — CLOPIDOGREL BISULFATE 75 MG PO TABS: 75.0000 mg | ORAL_TABLET | Freq: Every day | ORAL | 3 refills | Status: AC

## 2022-10-20 MED ORDER — CYANOCOBALAMIN 1000 MCG/ML IJ SOLN
1000.0000 ug | Freq: Once | INTRAMUSCULAR | Status: DC
  Filled 2022-10-20: qty 1

## 2022-10-20 MED ORDER — ACETAMINOPHEN 325 MG PO TABS: 650.0000 mg | ORAL_TABLET | Freq: Four times a day (QID) | ORAL | 2 refills | Status: AC | PRN

## 2022-10-20 MED ORDER — LISINOPRIL 20 MG PO TABS: 20.0000 mg | ORAL_TABLET | Freq: Every day | ORAL | 11 refills | Status: AC

## 2022-10-20 MED ORDER — ASPIRIN 81 MG PO TBEC: 81.0000 mg | DELAYED_RELEASE_TABLET | Freq: Every day | ORAL | 0 refills | Status: AC

## 2022-10-20 MED ORDER — VITAMIN B-12 1000 MCG PO TABS: 1000.0000 ug | ORAL_TABLET | Freq: Every day | ORAL | 1 refills | Status: AC

## 2022-10-20 NOTE — Care Management Obs Status (Signed)
MEDICARE OBSERVATION STATUS NOTIFICATION   Patient Details  Name: Victoria Gutierrez MRN: [id number] Date of Birth: July 22, 1945   Medicare Observation Status Notification Given:  Yes    Ottie Tillery, LCSW 10/20/2022, 12:04 PM

## 2022-10-20 NOTE — Discharge Summary (Signed)
[facility] Discharge Summary   Patient: Victoria Gutierrez:[id number]  PCP: Jerl Mina, MD  Date of admission: 10/18/2022   Date of discharge:  10/20/2022     Discharge Diagnoses:  Principal Problem:   AKI (acute kidney injury) Active Problems:   Acute metabolic encephalopathy   Right hip pain   Essential hypertension   Chronic diastolic CHF (congestive heart failure)   Admitted From: Home Disposition:  Home with home health services  Recommendations for Outpatient Follow-up:  Follow-up with PCP in 1 week, continue to monitor BP at home and follow with PCP to titrate medication accordingly.  Take aspirin for 90 days and then stop, continue to take Plavix. Follow with neurology in 1 to 2 weeks Follow with cardiology for cardiac monitor to rule out occult A-fib. Follow-up with orthopedic surgery in few weeks to discuss the plan plan of right hip surgery Follow up LABS/TEST:     Diet recommendation: Cardiac diet  Activity: The patient is advised to gradually reintroduce usual activities, as tolerated  Discharge Condition: stable  Code Status: Full code   History of present illness: As per the H and P dictated on admission  Hospital Course:  Victoria Gutierrez is a 77 y.o. female with medical history significant of hypertension, hyperlipidemia, HFpEF, CVA, CKD stage III, plaque psoriasis, hard of hearing, who presents to the ED due to increased generalized weakness.  History predominantly obtained from patient's daughter at bedside, [name].  Patient is not a very good historian.  [name] states that for the last 2+ weeks, Victoria Gutierrez has seemed not quite herself.  She has been experiencing increasing generalized weakness and has seemed more dazed.  She came in on 4/8 at which time she was admitted for UTI, hypotension and possible TIA.  Since she has been at home, patient has continued to have episodes of generalized weakness with the most recent episode today.  After Ms. Kivett  was helped to the bathroom by the home health aide, she was unable to stand up on her own.  Her blood pressure was taken at that time and it was noted to be extremely low with systolics in the 70s.  At that time, her speech also seemed dysarthric and her daughter noted left-sided facial droop.  She notes that facial droop has been chronic since previous stroke but seems worse at this time.   ED course: On arrival to the ED, patient was normotensive at 115/79 with heart rate of 88.  She was saturating at 97% on room air.  She was afebrile at 97.9.  Initial blood work notable for WBC of 14.2, hemoglobin 16.0, sodium of 137, potassium 2.9, bicarb 28, glucose 109, creatinine 0.91, BUN 21 with GFR of 27.  Urinalysis was obtained that had increased squamous epithelial and rare bacteria.  COVID-19 and influenza negative.  TSH minimally elevated and appropriate for age.  CT head was obtained with no acute intracranial abnormalities.  CT renal stone study was obtained that demonstrated Interval development of marked left renal atrophy compared to 2019 concerning for renal artery stenosis.  In addition, there was abnormal appearance of the subcapital region of the right femoral neck.  Patient started on IV fluids and [facility] contacted for admission.    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 No prior history of A fib. Compliant with Aspirin and Plavix, with no missed  doses.  Neurology consulted; appreciate their recommendations Carotid dopplers ordered given AKI, Moderate amount of bilateral atherosclerotic plaque, not resulting in a hemodynamically significant stenosis within either internal carotid artery. If clinical concern persists, further evaluation with CTA could be performed as indicated. TTE LVEF 65 to 70%, grade 1 diastolic dysfunction, no significant valvular abnormality, negative PFO.  LDL 118, goal <7, TSH 6.27 elevated, free T40.94 Within normal range Started aspirin 81 mg daily, Plavix 75 mg p.o. daily, Lipitor 80 mg p.o. daily. S/p permissive hypertension, now keep normotensive.  Patient was seen by neurologist, recommended dual antiplatelet therapy for 90 days followed by Plavix 75 mg p.o. daily.  Cardiology was informed to arrange for Zio patch.  Patient was advised to follow-up with neurology and cardiology as an outpatient.  PT and OT eval done, recommended physical therapy, home services were arranged by TOC.  Patient's daughter agreed to the plan.  # Right Femur Fracture: CT imaging confirmed femur fracture.  Discussed with Dr. Allena Katz; given difficulty discerning timing of fracture, would be beneficial to obtain plain x-ray with MRI to help delineate acuity. Orthopedic surgery consulted, No plan for surgery during this admission. Will plan for outpatient arthroplasty. Hip x-ray: Acute subcapital impacted fracture of the proximal right femur.  MRI: Very limited examination due to patient motion. 2. Right femoral neck fracture appears to be a subacute healing fracture with minimal residual surrounding marrow edema and small joint effusion. 3. Significant artifact related to a left hip prosthesis. No obvious complicating features are identified. 4. No significant intrapelvic abnormalities. Continue as needed medication for pain control, PT and OT eval, weightbearing as tolerated.  Follow-up with orthopedic surgery as an outpatient for definitive treatment. C/o feeling weak and tired, so checked vitamin B12 level which was 167 and vitamin D level 28 low.  # Hypokalemia, potassium repleted cautiously due to CKD. Resolved  # AKI on CKD stage 3a, Baseline creatinine 0.9 few months ago Creatinine 1.91 on admission, Due to renal functions, avoid nephrotoxic medications.  Renal Duplex: No significant stenosis of the right main renal artery. Nonvisualization of the left main renal artery  or vein. Vascular surgery consulted, recommended no intervention as it is chronic.  Patient has nonfunctional left kidney.  May need to follow with nephrology down the road.  Creatinine 1.28 improved, advised to continue oral hydration and follow with nephrology if renal functions get worse.  Discussed with patient's daughter who verbalized understanding.  # Hypertension and chronic diastolic dysfunction, no exacerbation noticed on my exam.  Permissive hypertension due to acute stroke, now keep normotensive.  Resumed home medications and patient was advised to monitor BP at home and follow with PCP to titrate medications accordingly.  # Vitamin B12 deficiency, vitamin B12 level 167, goal >400.  Vitamin B12 1000 mcg IM injection given before discharge, started vitamin B12 1000 mcg p.o. daily.  Follow with PCP to repeat vitamin B12 level after 3 to 6 months. # Vitamin D deficiency: started vitamin D 50,000 units p.o. weekly, follow with PCP to repeat vitamin D level after 3 to 6 months.  Body mass index is 25.14 kg/m.  Nutrition Interventions:   - Patient was instructed, not to drive, operate heavy machinery, perform activities at heights, swimming or participation in water activities or provide baby sitting services while on Pain, Sleep and Anxiety Medications; until her outpatient Physician has advised to do so again.  - Also recommended to not to take more than prescribed Pain, Sleep and Anxiety Medications.  Patient was seen by physical therapy, who recommended Home health, which was arranged. On the day of the discharge the patient's vitals were stable, and no other acute medical condition were reported by patient. the patient was felt safe to be discharge at Home with Home health.  Consultants: Neurology Procedures: None  Discharge Exam: General: Appear in no distress, no Rash; Oral Mucosa Clear, moist. Cardiovascular: S1 and S2 Present, no Murmur, Respiratory: normal respiratory effort,  Bilateral Air entry present and no Crackles, no wheezes Abdomen: Bowel Sound present, Soft and no tenderness, no hernia Extremities: no Pedal edema, no calf tenderness Neurology: alert and oriented to time, place, and person affect appropriate.  Filed Weights   10/18/22 1925 10/18/22 2315  Weight: 72.6 kg 72.8 kg   Vitals:   10/20/22 0436 10/20/22 0739  BP: (!) 178/97 (!) 151/100  Pulse: 78 81  Resp: 17 20  Temp: 97.8 F (36.6 C) 98 F (36.7 C)  SpO2: 97% 100%    DISCHARGE MEDICATION: Allergies as of 10/20/2022   No Known Allergies      Medication List     TAKE these medications    acetaminophen 325 MG tablet Commonly known as: Tylenol Take 2 tablets (650 mg total) by mouth every 6 (six) hours as needed for moderate pain, mild pain, headache or fever.   amLODipine 5 MG tablet Commonly known as: NORVASC Take 2 tablets (10 mg total) by mouth at bedtime as needed (Systolic BP greater than 150 mmHg). What changed:  medication strength when to take this reasons to take this   aspirin EC 81 MG tablet Take 1 tablet (81 mg total) by mouth daily. Swallow whole. Start taking on: October 21, 2022 What changed:  medication strength how much to take additional instructions   atorvastatin 80 MG tablet Commonly known as: LIPITOR Take 1 tablet (80 mg total) by mouth daily at 6 PM.   clopidogrel 75 MG tablet Commonly known as: PLAVIX Take 1 tablet (75 mg total) by mouth daily.   cyanocobalamin 1000 MCG tablet Commonly known as: VITAMIN B12 Take 1 tablet (1,000 mcg total) by mouth daily.   lisinopril 20 MG tablet Commonly known as: ZESTRIL Take 1 tablet (20 mg total) by mouth daily. Skip the dose if systolic BP less than 130 mmHg What changed: additional instructions   meclizine 12.5 MG tablet Commonly known as: ANTIVERT Take 12.5 mg by mouth 3 (three) times daily as needed for dizziness.   Vitamin D (Ergocalciferol) 1.25 MG (50000 UNIT) Caps capsule Commonly  known as: DRISDOL Take 1 capsule (50,000 Units total) by mouth every 7 (seven) days.       No Known Allergies Discharge Instructions     Ambulatory referral to Neurology   Complete by: As directed    Call MD for:  difficulty breathing, headache or visual disturbances   Complete by: As directed    Call MD for:  extreme fatigue   Complete by: As directed    Call MD for:  persistant dizziness or light-headedness   Complete by: As directed    Call MD for:  persistant nausea and vomiting   Complete by: As directed    Call MD for:  severe uncontrolled pain   Complete by: As directed    Call MD for:  temperature >100.4   Complete by: As directed    Diet - low sodium heart healthy   Complete by: As directed    Discharge instructions   Complete by: As directed  Follow-up with PCP in 1 week, continue to monitor BP at home and follow with PCP to titrate medication accordingly.  Take aspirin for 90 days and then stop, continue to take Plavix. Follow with neurology in 1 to 2 weeks Follow with cardiology for cardiac monitor to rule out occult A-fib. Follow-up with orthopedic surgery in few weeks to discuss the plan plan of right hip surgery   Increase activity slowly   Complete by: As directed        The results of significant diagnostics from this hospitalization (including imaging, microbiology, ancillary and laboratory) are listed below for reference.    Significant Diagnostic Studies: ECHOCARDIOGRAM COMPLETE  Result Date: 10/19/2022    ECHOCARDIOGRAM REPORT   Patient Name:   ELSBETH YEARICK Date of Exam: 10/19/2022 Medical Rec #:  [id number]      Height:       67.0 in Accession #:    [id number]     Weight:       160.5 lb Date of Birth:  [birth year]      BSA:          1.842 m Patient Age:    76 years       BP:           153/95 mmHg Patient Gender: F              HR:           89 bpm. Exam Location:  [facility] Procedure: 2D Echo, Cardiac Doppler and Color Doppler Indications:     Stroke I63.9   History:         Patient has prior history of Echocardiogram examinations, most                  recent 02/27/2022. Stroke; Risk Factors:Hypertension.  Sonographer:     Cristela Blue Referring Phys:  [id number] Verdene Lennert Diagnosing Phys: Arvilla Meres MD IMPRESSIONS  1. Left ventricular ejection fraction, by estimation, is 65 to 70%. The left ventricle has normal function. The left ventricle has no regional wall motion abnormalities. There is severe concentric left ventricular hypertrophy. Left ventricular diastolic  parameters are consistent with Grade I diastolic dysfunction (impaired relaxation).  2. Right ventricular systolic function is normal. The right ventricular size is normal.  3. The mitral valve is normal in structure. Trivial mitral valve regurgitation. No evidence of mitral stenosis.  4. The aortic valve is tricuspid. There is mild calcification of the aortic valve. Aortic valve regurgitation is not visualized. Aortic valve sclerosis/calcification is present, without any evidence of aortic stenosis. Aortic valve area, by VTI measures  3.79 cm. Aortic valve mean gradient measures 3.0 mmHg. Aortic valve Vmax measures 1.14 m/s. FINDINGS  Left Ventricle: Left ventricular ejection fraction, by estimation, is 65 to 70%. The left ventricle has normal function. The left ventricle has no regional wall motion abnormalities. The left ventricular internal cavity size was normal in size. There is  severe concentric left ventricular hypertrophy. Left ventricular diastolic parameters are consistent with Grade I diastolic dysfunction (impaired relaxation). Right Ventricle: The right ventricular size is normal. No increase in right ventricular wall thickness. Right ventricular systolic function is normal. Left Atrium: Left atrial size was normal in size. Right Atrium: Right atrial size was normal in size. Pericardium: There is no evidence of pericardial effusion. Mitral Valve: The mitral valve is normal in structure.  There is mild calcification of the mitral valve leaflet(s). Trivial mitral valve regurgitation. No evidence of mitral valve stenosis.  MV peak gradient, 3.6 mmHg. The mean mitral valve gradient is 2.0  mmHg. Tricuspid Valve: The tricuspid valve is normal in structure. Tricuspid valve regurgitation is trivial. No evidence of tricuspid stenosis. Aortic Valve: The aortic valve is tricuspid. There is mild calcification of the aortic valve. Aortic valve regurgitation is not visualized. Aortic valve sclerosis/calcification is present, without any evidence of aortic stenosis. Aortic valve mean gradient measures 3.0 mmHg. Aortic valve peak gradient measures 5.2 mmHg. Aortic valve area, by VTI measures 3.79 cm. Pulmonic Valve: The pulmonic valve was normal in structure. Pulmonic valve regurgitation is not visualized. No evidence of pulmonic stenosis. Aorta: The aortic root is normal in size and structure. Venous: The inferior vena cava was not well visualized. IAS/Shunts: No atrial level shunt detected by color flow Doppler.  LEFT VENTRICLE PLAX 2D LVIDd:         3.60 cm   Diastology LVIDs:         2.30 cm   LV e' medial:    3.59 cm/s LV PW:         1.60 cm   LV E/e' medial:  18.1 LV IVS:        1.80 cm   LV e' lateral:   4.13 cm/s LVOT diam:     2.00 cm   LV E/e' lateral: 15.7 LV SV:         56 LV SV Index:   31 LVOT Area:     3.14 cm  RIGHT VENTRICLE RV Basal diam:  3.60 cm RV Mid diam:    2.60 cm RV S prime:     10.70 cm/s TAPSE (M-mode): 2.0 cm LEFT ATRIUM             Index        RIGHT ATRIUM           Index LA diam:        2.40 cm 1.30 cm/m   RA Area:     12.20 cm LA Vol (A2C):   26.4 ml 14.33 ml/m  RA Volume:   27.00 ml  14.66 ml/m LA Vol (A4C):   46.0 ml 24.98 ml/m LA Biplane Vol: 38.5 ml 20.90 ml/m  AORTIC VALVE AV Area (Vmax):    3.16 cm AV Area (Vmean):   3.35 cm AV Area (VTI):     3.79 cm AV Vmax:           114.50 cm/s AV Vmean:          79.600 cm/s AV VTI:            0.149 m AV Peak Grad:      5.2 mmHg  AV Mean Grad:      3.0 mmHg LVOT Vmax:         115.00 cm/s LVOT Vmean:        85.000 cm/s LVOT VTI:          0.179 m LVOT/AV VTI ratio: 1.21  AORTA Ao Root diam: 3.10 cm MITRAL VALVE               TRICUSPID VALVE MV Area (PHT): 4.29 cm    TR Peak grad:   11.8 mmHg MV Area VTI:   2.81 cm    TR Vmax:        172.00 cm/s MV Peak grad:  3.6 mmHg MV Mean grad:  2.0 mmHg    SHUNTS MV Vmax:       0.95 m/s    Systemic VTI:  0.18 m MV Vmean:      62.5 cm/s   Systemic Diam: 2.00 cm MV Decel Time: 177 msec MV E velocity: 65.00 cm/s MV A velocity: 95.90 cm/s MV E/A ratio:  0.68 Arvilla Meres MD Electronically signed by Arvilla Meres MD Signature Date/Time: 10/19/2022/1:20:02 PM    Final    US RENAL ARTERY DUPLEX COMPLETE  Result Date: 10/19/2022 CLINICAL DATA:  Left renal atrophy EXAM: RENAL/URINARY TRACT ULTRASOUND RENAL DUPLEX DOPPLER ULTRASOUND COMPARISON:  CT renal stone protocol 10/18/2022 FINDINGS: Right Kidney: Length: 11.0. Echogenicity within normal limits. No mass or hydronephrosis visualized. Left Kidney: Length: 8.0.  Diffusely echogenic, atrophic cortex. Bladder:  Unremarkable RENAL DUPLEX ULTRASOUND Right Renal Artery Velocities: Origin:  100 cm/sec Mid:  76 cm/sec Hilum:  78 cm/sec Interlobar:  31 cm/sec Arcuate:  70 cm/sec Left Renal Artery: Not visualized Aortic Velocity:  57 cm/sec Right Renal-Aortic Ratios: Origin: 1.7 Mid:  1.3 Hilum: 1.4 Interlobar: 0.5 Arcuate: 1.2 Left Renal-Aortic Ratios: Left renal artery not visualized IMPRESSION: 1. No significant stenosis of the right main renal artery. 2. Nonvisualization of the left main renal artery or vein. Electronically Signed   By: Acquanetta Belling M.D.   On: 10/19/2022 13:01   US Carotid Bilateral (at [facility] and [facility] only)  Result Date: 10/19/2022 CLINICAL DATA:  CVA.  History of hypertension and smoking. EXAM: BILATERAL CAROTID DUPLEX ULTRASOUND TECHNIQUE: Wallace Cullens scale imaging, color Doppler and duplex ultrasound were performed of bilateral carotid and  vertebral arteries in the neck. COMPARISON:  None Available. FINDINGS: Criteria: Quantification of carotid stenosis is based on velocity parameters that correlate the residual internal carotid diameter with NASCET-based stenosis levels, using the diameter of the distal internal carotid lumen as the denominator for stenosis measurement. The following velocity measurements were obtained: RIGHT ICA: 57/13 cm/sec CCA: 62/19 cm/sec SYSTOLIC ICA/CCA RATIO:  0.9 ECA: 128 cm/sec LEFT ICA: 76/15 cm/sec CCA: 63/15 cm/sec SYSTOLIC ICA/CCA RATIO:  1.2 ECA: 104 cm/sec RIGHT CAROTID ARTERY: There is a minimal amount of intimal wall thickening and scattered eccentric echogenic plaque throughout the right common carotid artery (representative image 10). There is a moderate amount of eccentric echogenic plaque within the right carotid bulb (images 5 and 20), extending to involve the origin and proximal aspects of the right internal carotid artery (image 28), not resulting in elevated peak systolic velocities within the interrogated course of the right internal carotid artery to suggest a hemodynamically significant stenosis. RIGHT VERTEBRAL ARTERY:  Antegrade flow LEFT CAROTID ARTERY: There is a minimal amount of intimal thickening throughout the left common carotid artery. There is a moderate amount of eccentric echogenic plaque within left carotid bulb (images 42 and 58), extending to involve the origin and proximal aspects of the left internal carotid artery (image 66), not resulting in elevated peak systolic velocities within the interrogated course of the left internal carotid artery to suggest a hemodynamically significant stenosis. LEFT VERTEBRAL ARTERY:  Antegrade flow IMPRESSION: Moderate amount of bilateral atherosclerotic plaque, not resulting in a hemodynamically significant stenosis within either internal carotid artery. If clinical concern persists, further evaluation with CTA could be performed as indicated.  Electronically Signed   By: Simonne Come M.D.   On: 10/19/2022 12:48   MR HIP RIGHT WO CONTRAST  Result Date: 10/19/2022 CLINICAL DATA:  Right hip pain. EXAM: MR OF THE RIGHT HIP WITHOUT CONTRAST TECHNIQUE: Multiplanar, multisequence MR imaging was performed. No intravenous contrast was administered. COMPARISON:  CT scan, same date FINDINGS: Very limited examination due to patient motion.  As demonstrated on the CT scan there is a right femoral neck fracture. This appears to be a subacute healing fracture with minimal residual surrounding marrow edema and small joint effusion. The femoral head is normally located. No AVN. Significant artifact related to a left hip prosthesis. No obvious complicating features are identified. The pubic symphysis and SI joints are intact. No pelvic fractures bone lesions. The right hip musculature is grossly normal. No muscle tear or intramuscular hematoma. No significant peritrochanteric tendinosis or bursitis. No significant intrapelvic abnormalities are identified. IMPRESSION: 1. Very limited examination due to patient motion. 2. Right femoral neck fracture appears to be a subacute healing fracture with minimal residual surrounding marrow edema and small joint effusion. 3. Significant artifact related to a left hip prosthesis. No obvious complicating features are identified. 4. No significant intrapelvic abnormalities. Electronically Signed   By: Rudie Meyer M.D.   On: 10/19/2022 08:34   DG HIP UNILAT WITH PELVIS 2-3 VIEWS RIGHT  Result Date: 10/18/2022 CLINICAL DATA:  Closed right hip fracture. EXAM: DG HIP (WITH OR WITHOUT PELVIS) 2-3V RIGHT COMPARISON:  CT 10/18/2022 FINDINGS: Subcapital fracture of the right proximal femur. Sclerosis consistent with impaction of the fracture but could indicate early healing although no callus formation is identified. No evidence of inter trochanteric involvement. Mild valgus angulation. Degenerative changes in the hip joint. No  dislocation. Previous left hip arthroplasty with components appearing well seated. IMPRESSION: Acute subcapital impacted fracture of the proximal right femur. Electronically Signed   By: Burman Nieves M.D.   On: 10/18/2022 22:12   CT HIP RIGHT WO CONTRAST  Result Date: 10/18/2022 CLINICAL DATA:  Hip pain with stress fracture suspected. Negative x-ray. Fell today. EXAM: CT OF THE RIGHT HIP WITHOUT CONTRAST TECHNIQUE: Multidetector CT imaging of the right hip was performed according to the standard protocol. Multiplanar CT image reconstructions were also generated. RADIATION DOSE REDUCTION: This exam was performed according to the departmental dose-optimization program which includes automated exposure control, adjustment of the mA and/or kV according to patient size and/or use of iterative reconstruction technique. COMPARISON:  CT abdomen and pelvis 10/18/2022 FINDINGS: Bones/Joint/Cartilage Acute transverse fracture of the subcapital region of the left proximal femur with impaction of the fracture fragments and valgus alignment of the hip. Sclerosis in the superior femoral head may represent bone contusion, degenerative change, or early avascular necrosis. No dislocation at the hip joint. No destructive or expansile bone lesions. Ligaments Suboptimally assessed by CT. Muscles and Tendons No intramuscular hematoma or focal mass lesion. Soft tissues Calcification in the femoral artery. No significant soft tissue collection. IMPRESSION: Acute subcapital fracture of the proximal right femur with impaction of fracture fragments and valgus angulation. Sclerosis in the femoral head could be related to bone contusion, degenerative change, or avascular necrosis. Electronically Signed   By: Burman Nieves M.D.   On: 10/18/2022 20:12   MR BRAIN WO CONTRAST  Result Date: 10/18/2022 CLINICAL DATA:  Altered mental status EXAM: MRI HEAD WITHOUT CONTRAST TECHNIQUE: Multiplanar, multiecho pulse sequences of the brain  and surrounding structures were obtained without intravenous contrast. COMPARISON:  MRI Head 10/04/22 FINDINGS: Brain: Compared to prior exam there are new infarcts in the periventricular white matter in the right frontal lobe (series 5, image 24) and in the periatrial white matter on the left (series 5, image 29). Redemonstrated there is a chronic infarct in the right basal ganglia with linear regions of hyperintense signal on diffusion-weighted imaging, unchanged. No new sites of hemorrhage. No extra-axial fluid collection.  Sequela of severe chronic microvascular ischemic change. No hydrocephalus. Vascular: Normal flow voids. Skull and upper cervical spine: Normal marrow signal. Sinuses/Orbits: Bilateral lens replacement. No mastoid or middle ear effusion. Polypoid mucosal thickening left maxillary sinus. Other: None. IMPRESSION: 1. New acute to subacute infarcts in the periventricular white matter in the right frontal lobe and in the periatrial white matter on the left. 2. Unchanged chronic infarcts involving the right basal ganglia. Electronically Signed   By: Lorenza Cambridge M.D.   On: 10/18/2022 20:04   CT Renal Stone Study  Result Date: 10/18/2022 CLINICAL DATA:  Abdominal and flank pain, acute renal insufficiency EXAM: CT ABDOMEN AND PELVIS WITHOUT CONTRAST TECHNIQUE: Multidetector CT imaging of the abdomen and pelvis was performed following the standard protocol without IV contrast. Unenhanced CT was performed per clinician order. Lack of IV contrast limits sensitivity and specificity, especially for evaluation of abdominal/pelvic solid viscera. RADIATION DOSE REDUCTION: This exam was performed according to the departmental dose-optimization program which includes automated exposure control, adjustment of the mA and/or kV according to patient size and/or use of iterative reconstruction technique. COMPARISON:  03/18/2018 FINDINGS: Lower chest: No acute pleural or parenchymal lung disease. Hepatobiliary:  Unremarkable unenhanced appearance of the liver and gallbladder. Pancreas: Unremarkable unenhanced appearance. Spleen: Unremarkable unenhanced appearance. Adrenals/Urinary Tract: Interval development of severe left renal atrophy. Stable unenhanced appearance of the right kidney. No urinary tract calculi or obstructive uropathy. Bladder is decompressed, limiting its evaluation. The adrenals are unremarkable. Stomach/Bowel: No bowel obstruction or ileus. Normal appendix right lower quadrant. Distal colonic diverticulosis without diverticulitis. No bowel wall thickening or inflammatory change. Vascular/Lymphatic: Diffuse atherosclerosis of the aorta and its branches. Evaluation of the aortic lumen is limited without IV contrast. There is atherosclerosis of the bilateral renal arteries, and if left renal artery stenosis is suspected given asymmetric left renal atrophy renal Doppler ultrasound could be considered. No pathologic adenopathy within the abdomen or pelvis. Reproductive: Status post hysterectomy. No adnexal masses. Other: No free fluid or free intraperitoneal gas. No abdominal wall hernia. Musculoskeletal: Left hip arthroplasty. There is sclerosis in the subcapital region of the right femoral neck, with possible subtle trabecular disruption on the coronal and axial images. Findings could reflect an occult subcapital right femoral neck fracture or stress/insufficiency fracture. If there is pain referable to this region, MRI may be useful. No other acute bony abnormalities. Reconstructed images demonstrate no additional findings. IMPRESSION: 1. Interval development of marked left renal atrophy. Given the associated atherosclerosis of the aorta and its branches, left renal artery stenosis is suspected. Further evaluation with renal Doppler ultrasound may be useful. 2. Abnormal appearance of the subcapital region of the right femoral neck, which may reflect sequela of occult subcapital femoral neck fracture or  insufficiency fracture. If pain is referable to this region, MRI may be useful. 3. Distal colonic diverticulosis without diverticulitis. 4.  Aortic Atherosclerosis (ICD10-I70.0). Electronically Signed   By: Sharlet Salina M.D.   On: 10/18/2022 16:52   CT Head Wo Contrast  Result Date: 10/18/2022 CLINICAL DATA:  Head trauma history of strokes EXAM: CT HEAD WITHOUT CONTRAST TECHNIQUE: Contiguous axial images were obtained from the base of the skull through the vertex without intravenous contrast. RADIATION DOSE REDUCTION: This exam was performed according to the departmental dose-optimization program which includes automated exposure control, adjustment of the mA and/or kV according to patient size and/or use of iterative reconstruction technique. COMPARISON:  10/04/2022 FINDINGS: Brain: No evidence of acute infarction, hemorrhage, hydrocephalus, extra-axial collection or mass lesion/mass  effect. Extensive periventricular and deep white matter hypodensity, similar to prior examination. Vascular: No hyperdense vessel or unexpected calcification. Skull: Normal. Negative for fracture or focal lesion. Sinuses/Orbits: No acute finding. Other: None. IMPRESSION: No acute intracranial pathology. Advanced small-vessel white matter disease, unchanged compared to prior examination. Consider MRI to more sensitively evaluate for acute diffusion restricting infarction if suspected. Electronically Signed   By: Jearld Lesch M.D.   On: 10/18/2022 16:47   DG Chest Portable 1 View  Result Date: 10/18/2022 CLINICAL DATA:  Provided history: Weakness. EXAM: PORTABLE CHEST 1 VIEW COMPARISON:  Prior chest radiographs 10/04/2022 and earlier. FINDINGS: Heart size at the upper limits of normal. Aortic atherosclerosis. Ill-defined opacity within the medial right lung base. No appreciable airspace consolidation on the left. No evidence of pleural effusion or pneumothorax. No acute osseous abnormality identified. Degenerative changes of  the spine. Mild levocurvature of the lower thoracic spine. IMPRESSION: 1. Ill-defined opacity within medial right lung base. This may reflect atelectasis. However, pneumonia cannot excluded. Correlate clinically consider short-interval radiographic follow-up. 2.  Aortic Atherosclerosis (ICD10-I70.0). Electronically Signed   By: Jackey Loge D.O.   On: 10/18/2022 16:32   MR BRAIN WO CONTRAST  Result Date: 10/04/2022 CLINICAL DATA:  Mental status change, unknown cause EXAM: MRI HEAD WITHOUT CONTRAST TECHNIQUE: Multiplanar, multiecho pulse sequences of the brain and surrounding structures were obtained without intravenous contrast. COMPARISON:  MRI head 05/28/2022. FINDINGS: Brain: Prior right basal ganglia infarct with evidence of some hemorrhage in this region and associated encephalomalacia/gliosis. No evidence of acute infarct, acute hemorrhage, mass lesion or midline shift. Small remote right cerebellar infarct. Scattered T2/FLAIR hyperintensity in the white matter, nonspecific but compatible with chronic microvascular ischemic disease. Cerebral atrophy. Vascular: Major arterial flow voids are maintained. Skull and upper cervical spine: Normal marrow signal. Sinuses/Orbits: Left maxillary sinus retention cyst. No acute orbital finding. Other: No mastoid effusions. IMPRESSION: 1. No evidence of acute abnormality. 2. Remote right basal ganglia infarct and chronic microvascular ischemic change. Electronically Signed   By: Feliberto Harts M.D.   On: 10/04/2022 12:40   DG Chest Portable 1 View  Result Date: 10/04/2022 CLINICAL DATA:  Confusion, weakness EXAM: PORTABLE CHEST 1 VIEW COMPARISON:  05/27/2022 FINDINGS: Transverse diameter of heart is increased. There are no signs of pulmonary edema or new focal pulmonary consolidation. Crowding of markings in the medial right lower lung field has not changed significantly. There is no pleural effusion or pneumothorax. IMPRESSION: There are no new infiltrates or  signs of pulmonary edema. Electronically Signed   By: Ernie Avena M.D.   On: 10/04/2022 11:18   CT HEAD WO CONTRAST ( )  Result Date: 10/04/2022 CLINICAL DATA:  Mental status change, unknown cause EXAM: CT HEAD WITHOUT CONTRAST TECHNIQUE: Contiguous axial images were obtained from the base of the skull through the vertex without intravenous contrast. RADIATION DOSE REDUCTION: This exam was performed according to the departmental dose-optimization program which includes automated exposure control, adjustment of the mA and/or kV according to patient size and/or use of iterative reconstruction technique. COMPARISON:  CT head November 30, 23. FINDINGS: Brain: Remote perforator infarct in the right basal ganglia. Patchy white matter hypodensities, nonspecific but compatible with chronic microvascular ischemic disease. No evidence of acute large vascular territory infarct, acute hemorrhage, mass lesion, or midline shift. Vascular: Calcific atherosclerosis.  No hyperdense vessel. Skull: No acute fracture. Sinuses/Orbits: Left maxillary sinus retention cyst. No acute orbital findings. Other: No mastoid effusions. IMPRESSION: No evidence of acute intracranial abnormality. Electronically Signed  By: Feliberto Harts M.D.   On: 10/04/2022 10:42    Microbiology: Recent Results (from the past 240 hour(s))  Resp Panel by RT-PCR (Flu A&B, Covid) Anterior Nasal Swab     Status: None   Collection Time: 10/18/22  4:04 PM   Specimen: Anterior Nasal Swab  Result Value Ref Range Status   SARS Coronavirus 2 by RT PCR NEGATIVE NEGATIVE Final    Comment: (NOTE) SARS-CoV-2 target nucleic acids are NOT DETECTED.  The SARS-CoV-2 RNA is generally detectable in upper respiratory specimens during the acute phase of infection. The lowest concentration of SARS-CoV-2 viral copies this assay can detect is 138 copies/mL. A negative result does not preclude SARS-Cov-2 infection and should not be used as the sole basis  for treatment or other patient management decisions. A negative result may occur with  improper specimen collection/handling, submission of specimen other than nasopharyngeal swab, presence of viral mutation(s) within the areas targeted by this assay, and inadequate number of viral copies(<138 copies/mL). A negative result must be combined with clinical observations, patient history, and epidemiological information. The expected result is Negative.  Fact Sheet for Patients:  BloggerCourse.com  Fact Sheet for Healthcare Providers:  SeriousBroker.it  This test is no t yet approved or cleared by the [place] FDA and  has been authorized for detection and/or diagnosis of SARS-CoV-2 by FDA under an Emergency Use Authorization (EUA). This EUA will remain  in effect (meaning this test can be used) for the duration of the COVID-19 declaration under Section 564(b)(1) of the Act, 21 U.S.C.section 360bbb-3(b)(1), unless the authorization is terminated  or revoked sooner.       Influenza A by PCR NEGATIVE NEGATIVE Final   Influenza B by PCR NEGATIVE NEGATIVE Final    Comment: (NOTE) The Xpert Xpress SARS-CoV-2/FLU/RSV plus assay is intended as an aid in the diagnosis of influenza from Nasopharyngeal swab specimens and should not be used as a sole basis for treatment. Nasal washings and aspirates are unacceptable for Xpert Xpress SARS-CoV-2/FLU/RSV testing.  Fact Sheet for Patients: BloggerCourse.com  Fact Sheet for Healthcare Providers: SeriousBroker.it  This test is not yet approved or cleared by the [place] FDA and has been authorized for detection and/or diagnosis of SARS-CoV-2 by FDA under an Emergency Use Authorization (EUA). This EUA will remain in effect (meaning this test can be used) for the duration of the COVID-19 declaration under Section 564(b)(1) of the Act, 21  U.S.C. section 360bbb-3(b)(1), unless the authorization is terminated or revoked.  Performed at [facility], [address] [place], [place] [address]      Labs: CBC: Recent Labs  Lab 10/18/22 1342 10/19/22 0518 10/20/22 0603  WBC 14.2* 12.1* 8.8  HGB 16.0* 15.2* 14.7  HCT 46.0 45.2 43.8  MCV 93.5 94.8 95.2  PLT 305 228 243   Basic Metabolic Panel: Recent Labs  Lab 10/18/22 1342 10/18/22 1600 10/19/22 0518 10/20/22 0603  NA 137  --  137 139  K 2.9*  --  3.3* 3.5  CL 98  --  103 105  CO2 28  --  23 27  GLUCOSE 109*  --  109* 111*  BUN 21  --  29* 33*  CREATININE 1.91*  --  2.01* 1.28*  CALCIUM 9.5  --  9.0 9.2  MG  --  2.4 2.2 2.2  PHOS  --   --  4.3 3.0   Liver Function Tests: No results for input(s): "AST", "ALT", "ALKPHOS", "BILITOT", "PROT", "ALBUMIN" in the  last 168 hours. No results for input(s): "LIPASE", "AMYLASE" in the last 168 hours. No results for input(s): "AMMONIA" in the last 168 hours. Cardiac Enzymes: No results for input(s): "CKTOTAL", "CKMB", "CKMBINDEX", "TROPONINI" in the last 168 hours. BNP (last 3 results) Recent Labs    10/04/22 0937  BNP 96.6   CBG: No results for input(s): "GLUCAP" in the last 168 hours.  Time spent: 35 minutes  Signed:  Gillis Santa  [facility]  10/20/2022 12:57 PM

## 2022-10-20 NOTE — Evaluation (Addendum)
Speech Language Pathology Evaluation Patient Details Name: SWEET JARVIS MRN: [id number] DOB: Nov 22, 1945 Today's Date: 10/20/2022 Time: 0865-7846 SLP Time Calculation (min) (ACUTE ONLY): 60 min  Problem List:  Patient Active Problem List   Diagnosis Date Noted   AKI (acute kidney injury) 10/18/2022   Right hip pain 10/18/2022   Altered mental status 10/05/2022   Acute metabolic encephalopathy 10/04/2022   UTI (urinary tract infection) 10/04/2022   Chronic diastolic CHF (congestive heart failure) 10/04/2022   Myocardial injury 10/04/2022   Acute renal failure superimposed on stage 3a chronic kidney disease 10/04/2022   Fall at home, initial encounter 10/04/2022   Dysarthria 05/28/2022   Essential hypertension 05/28/2022   Dyslipidemia 05/28/2022   Depression 05/28/2022   Peripheral neuropathy 05/28/2022   Hypokalemia 05/28/2022   History of CVA (cerebrovascular accident) 05/28/2022   History of ischemic stroke 02/26/2022   Acute/subacute CVA 02/26/2022   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  02/26/2022   Tobacco abuse 02/26/2022   Displaced fracture of left femoral neck 04/05/2019   Carotid stenosis 02/20/2019   Ischemic stroke 01/20/2019   Localized osteoarthritis of hands, bilateral 09/23/2016   Multiple joint pain 08/25/2016   Psoriasis, unspecified 08/25/2016   Cerebral vascular disease 04/16/2014   Hyperlipidemia 04/16/2014   Hypertension 04/16/2014   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   Past Medical History:  Past Medical History:  Diagnosis Date   Arthritis    knees, hands   H/O thyroid disease    No issues currently   HOH (hard of hearing)    Hypertension    Stroke 12/2018   Wears hearing aid in both ears    has, does not wear   Past Surgical History:  Past Surgical History:  Procedure Laterality Date   ABDOMINAL HYSTERECTOMY     CATARACT EXTRACTION W/PHACO Right 09/18/2019   Procedure: CATARACT EXTRACTION PHACO AND INTRAOCULAR  LENS PLACEMENT (IOC) RIGHT 11.65  01:03.3;  Surgeon: Galen Manila, MD;  Location: [facility];  Service: Ophthalmology;  Laterality: Right;   CATARACT EXTRACTION W/PHACO Left 10/09/2019   Procedure: CATARACT EXTRACTION PHACO AND INTRAOCULAR LENS PLACEMENT (IOC) LEFT 10.30  00:48.5;  Surgeon: Galen Manila, MD;  Location: [facility];  Service: Ophthalmology;  Laterality: Left;   TOTAL HIP ARTHROPLASTY Left 04/05/2019   Procedure: TOTAL HIP ARTHROPLASTY ANTERIOR APPROACH;  Surgeon: Kennedy Bucker, MD;  Location: [facility];  Service: Orthopedics;  Laterality: Left;   HPI:  Pt is a 77 y.o. female with medical history significant of hypertension, hyperlipidemia, HFpEF, CVA w/ chronic L facial droop(slight), CKD stage III, plaque psoriasis, hard of hearing, who presents to the ED due to increased generalized weakness.  History predominantly obtained from patient's daughter at bedside, [name].  Patient is not a very good historian.   [name] states that for the last 2+ weeks, Ms. Leaf has seemed not quite herself.  She has been experiencing increasing generalized weakness and has seemed more dazed.  She came in on 4/8 at which time she was admitted for UTI, hypotension and possible TIA.  Since she has been at home, patient has continued to have episodes of generalized weakness with the most recent episode today.  After Ms. Bezanson was helped to the bathroom by the home health aide, she was unable to stand up on her own.  Her blood pressure was taken at that time and it was noted to be extremely low with systolics in the 70s.  At that time, her speech also seemed dysarthric and  her daughter noted left-sided facial droop.  She notes that facial droop has been chronic since previous stroke but seems worse at this time. Orthopedic surgery consulted d/t R femur fx, No plan for surgery during this admission. Will plan for outpatient arthroplasty.   Hip x-ray: Acute subcapital impacted fracture of the  proximal right femur.    MRI: New acute to subacute infarcts in the periventricular white  matter in the right frontal lobe and in the periatrial white matter  on the left.  2. Unchanged chronic infarcts involving the right basal ganglia.   CXR: Ill-defined opacity within medial right lung base. This may  reflect atelectasis. However, pneumonia cannot excluded. Correlate.    Per chart and Daughter's report, patient is assisted in cognitive ADLS at home as she lives with her daughter who manages medications, bills, driving etc.  Daughter endorsed pt demonstrates "slower processing" during ADLs in the home -- she has Caregivers in the home ~40 hours per week; Family lives w/ pt full-time.     Assessment / Plan / Recommendation Clinical Impression   Pt seen today w/ Cognitive-linguistic screening at bedside using the SLUMS assessment tool.  Pt awake, verbal and followed all directions w/ cue. Mild confusion/slow to resond in verbal responses/conversation intermittently. Per chart and Daughter's report, patient is assisted in cognitive ADLS at home as she lives with her daughter who manages medications, bills, driving etc.  Daughter endorsed pt demonstrates "slower processing" during ADLs in the home -- she has Caregivers in the home ~40 hours per week; Family lives w/ pt full-time. Pt is HOH.    Pt presents with mild-moderate cognitive communication impairment characterized by deficits in the areas of attention, recall, mental flexibility, and executive functioning per demonstration on SLUMS. Score was 20-21/30 which with HS education is indicative of neurocognitive deficits. Deficits may impact ability to independently manage finances, medications, and maintain safety in the home environment. History of cognitive deficits are present per Daughter's report -- monitoring of certain ADL tasks has been ongoing "for a little while now".  With MRI results revealing acute to subacute infarcts in the  periventricular white matter in the right frontal lobe and in the periatrial white matter on the left; unchanged chronic infarcts involving the right basal ganglia in addition to current presentation this admit/Baseline deficits reported, suspect Cognitive-linguistic impact and need for Supervision in the home and during ADLs. Suspect pt could be close to/at her previous Baseline as per Daughter's description but f/u for Cognitive intervention could be beneficial w/ cognitive ADLs in the dc environment. First, would strongly recommend f/u w/ Neurology for formal assessment of pt's Cognitive function/status in setting of Baseline deficits described by Daughter.   Primary deficits were associated with memory retrieval tasks and tasks of higher level executive functioning. Pt improved with cueing. Daughter described maintaining pt's independence in the home w/ ADL tasks, incorporating games/puzzles as activities, and getting pt out of the house for stimulation. Encouraged continuation of these activities. No further Acute ST services indicated currently but f/u with ST services in the Outpatient setting as determined needed was discussed w/ pt and Dtr; both in agreement w/ plan.      SLP Assessment  SLP Recommendation/Assessment: All further Speech Lanaguage Pathology  needs can be addressed in the next venue of care (if desired; f/u w/ formal assessment w/ Neurology) SLP Visit Diagnosis: Cognitive communication deficit (R41.841)    Recommendations for follow up therapy are one component of a multi-disciplinary discharge planning process, led  by the attending physician.  Recommendations may be updated based on patient status, additional functional criteria and insurance authorization.    Follow Up Recommendations  Follow physician's recommendations for discharge plan and follow up therapies (for cognitive-linguistic therapy moving forward)    Assistance Recommended at Discharge  Intermittent  Supervision/Assistance (baseline)  Functional Status Assessment  (appears at her Baseline as per Dtr)  Frequency and Duration  (n/a)   (n/a)      SLP Evaluation Cognition  Overall Cognitive Status: History of cognitive impairments - at baseline (per Dtr) Arousal/Alertness: Awake/alert Orientation Level: Oriented to person;Oriented to place;Oriented to situation;Disoriented to time Year: 2024 Month: April (Spring) Attention: Focused;Sustained Focused Attention: Appears intact (fair) Sustained Attention: Appears intact Memory: Impaired Memory Impairment: Decreased recall of new information (5 words) Awareness: Appears intact Problem Solving:  (concrete present) Executive Function: Sequencing;Organizing Sequencing: Impaired Sequencing Impairment: Verbal complex;Functional complex Organizing: Impaired Organizing Impairment: Verbal complex;Functional complex Behaviors:  (slower response time) Safety/Judgment: Appears intact Comments: for concrete situations; less descriptive w/ more complex situation, less details       Comprehension  Auditory Comprehension Overall Auditory Comprehension: Appears within functional limits for tasks assessed Yes/No Questions: Within Functional Limits (basic+) Commands: Within Functional Limits (2 steps) Conversation: Simple (-Complex) Interfering Components: Processing speed (min slower) EffectiveTechniques: Extra processing time Visual Recognition/Discrimination Discrimination: Not tested Reading Comprehension Reading Status: Within funtional limits    Expression Expression Primary Mode of Expression: Verbal Verbal Expression Overall Verbal Expression: Appears within functional limits for tasks assessed Initiation:  (functional) Automatic Speech: Name;Social Response;Counting;Day of week (WFL) Level of Generative/Spontaneous Verbalization: Psychologist, sport and exercise (general) Repetition: No impairment Naming: No impairment Pragmatics: No  impairment Written Expression Dominant Hand: Right Written Expression: Not tested   Oral / Motor  Oral Motor/Sensory Function Overall Oral Motor/Sensory Function: Within functional limits (grossly) Motor Speech Overall Motor Speech: Appears within functional limits for tasks assessed Respiration: Within functional limits Phonation: Normal Resonance: Within functional limits Articulation: Within functional limitis Intelligibility: Intelligible Motor Planning: Witnin functional limits Motor Speech Errors: Not applicable               Jerilynn Som, MS, CCC-SLP Speech Language Pathologist Rehab Services; [facility] [phone] (ascom) Nickayla Mcinnis 10/20/2022, 1:28 PM

## 2022-10-20 NOTE — TOC Progression Note (Addendum)
Transition of Care [facility]) - Progression Note    Patient Details  Name: Victoria Gutierrez MRN: [id number] Date of Birth: [birth year]  [facility] CM/SW Contact  Allena Katz, LCSW Phone Number: 10/20/2022, 12:01 PM  Clinical Narrative:  Adelina Mings with [facility] able to accept for PT/OT. Pt still seeing dr Burnett Sheng in [place] for primary care. Daughter Ruben Gottron reports patient lives with her and has caregivers there all day and that the patients Is not left alone. Pt was previously going to outpatient therapy but now is unable and daughter would like to start home health services.           Expected Discharge Plan and Services                                               Social Determinants of Health (SDOH) Interventions SDOH Screenings   Food Insecurity: No Food Insecurity (10/18/2022)  Housing: Low Risk  (10/18/2022)  Transportation Needs: No Transportation Needs (10/18/2022)  Utilities: Not At Risk (10/18/2022)  Financial Resource Strain: Low Risk  (06/09/2017)  Physical Activity: Unknown (06/09/2017)  Social Connections: Unknown (06/09/2017)  Stress: No Stress Concern Present (06/09/2017)  Tobacco Use: High Risk (10/18/2022)    Readmission Risk Interventions     No data to display

## 2022-10-20 NOTE — TOC Transition Note (Signed)
Transition of Care [facility]) - CM/SW Discharge Note   Patient Details  Name: Victoria Gutierrez MRN: [id number] Date of Birth: Mar 16, 1946  Transition of Care [facility]) CM/SW Contact:  Allena Katz, LCSW Phone Number: 10/20/2022, 1:36 PM   Clinical Narrative:    Pt has orders to discharge home with [facility]. Adelina Mings with [facility] notified.           Patient Goals and CMS Choice      Discharge Placement                         Discharge Plan and Services Additional resources added to the After Visit Summary for                                       Social Determinants of Health (SDOH) Interventions SDOH Screenings   Food Insecurity: No Food Insecurity (10/18/2022)  Housing: Low Risk  (10/18/2022)  Transportation Needs: No Transportation Needs (10/18/2022)  Utilities: Not At Risk (10/18/2022)  Financial Resource Strain: Low Risk  (06/09/2017)  Physical Activity: Unknown (06/09/2017)  Social Connections: Unknown (06/09/2017)  Stress: No Stress Concern Present (06/09/2017)  Tobacco Use: High Risk (10/18/2022)     Readmission Risk Interventions     No data to display

## 2022-10-20 NOTE — TOC Progression Note (Signed)
Transition of Care [facility]) - Progression Note    Patient Details  Name: Victoria Gutierrez MRN: [id number] Date of Birth: 08-10-1945  Transition of Care [facility]) CM/SW Contact  Allena Katz, LCSW Phone Number: 10/20/2022, 9:19 AM  Clinical Narrative:   CSW attempted to deliver MOON unable to reach patients daughters and pt is not fully oriented. CSW to try again later.         Expected Discharge Plan and Services                                               Social Determinants of Health (SDOH) Interventions SDOH Screenings   Food Insecurity: No Food Insecurity (10/18/2022)  Housing: Low Risk  (10/18/2022)  Transportation Needs: No Transportation Needs (10/18/2022)  Utilities: Not At Risk (10/18/2022)  Financial Resource Strain: Low Risk  (06/09/2017)  Physical Activity: Unknown (06/09/2017)  Social Connections: Unknown (06/09/2017)  Stress: No Stress Concern Present (06/09/2017)  Tobacco Use: High Risk (10/18/2022)    Readmission Risk Interventions     No data to display

## 2022-10-21 DIAGNOSIS — I69398 Other sequelae of cerebral infarction: Secondary | ICD-10-CM | POA: Diagnosis not present

## 2022-10-21 DIAGNOSIS — I959 Hypotension, unspecified: Secondary | ICD-10-CM | POA: Diagnosis not present

## 2022-10-21 DIAGNOSIS — W19XXXD Unspecified fall, subsequent encounter: Secondary | ICD-10-CM | POA: Diagnosis not present

## 2022-10-21 DIAGNOSIS — M6281 Muscle weakness (generalized): Secondary | ICD-10-CM | POA: Diagnosis not present

## 2022-10-21 DIAGNOSIS — N179 Acute kidney failure, unspecified: Secondary | ICD-10-CM | POA: Diagnosis not present

## 2022-10-21 DIAGNOSIS — I130 Hypertensive heart and chronic kidney disease with heart failure and stage 1 through stage 4 chronic kidney disease, or unspecified chronic kidney disease: Secondary | ICD-10-CM | POA: Diagnosis not present

## 2022-10-21 DIAGNOSIS — S7291XD Unspecified fracture of right femur, subsequent encounter for closed fracture with routine healing: Secondary | ICD-10-CM | POA: Diagnosis not present

## 2022-10-21 DIAGNOSIS — E876 Hypokalemia: Secondary | ICD-10-CM | POA: Diagnosis not present

## 2022-10-21 DIAGNOSIS — N390 Urinary tract infection, site not specified: Secondary | ICD-10-CM | POA: Diagnosis not present

## 2022-10-22 DIAGNOSIS — E876 Hypokalemia: Secondary | ICD-10-CM | POA: Diagnosis not present

## 2022-10-22 DIAGNOSIS — N390 Urinary tract infection, site not specified: Secondary | ICD-10-CM | POA: Diagnosis not present

## 2022-10-22 DIAGNOSIS — I959 Hypotension, unspecified: Secondary | ICD-10-CM | POA: Diagnosis not present

## 2022-10-22 DIAGNOSIS — N179 Acute kidney failure, unspecified: Secondary | ICD-10-CM | POA: Diagnosis not present

## 2022-10-22 DIAGNOSIS — M6281 Muscle weakness (generalized): Secondary | ICD-10-CM | POA: Diagnosis not present

## 2022-10-22 DIAGNOSIS — I130 Hypertensive heart and chronic kidney disease with heart failure and stage 1 through stage 4 chronic kidney disease, or unspecified chronic kidney disease: Secondary | ICD-10-CM | POA: Diagnosis not present

## 2022-10-22 DIAGNOSIS — S7291XD Unspecified fracture of right femur, subsequent encounter for closed fracture with routine healing: Secondary | ICD-10-CM | POA: Diagnosis not present

## 2022-10-22 DIAGNOSIS — I69398 Other sequelae of cerebral infarction: Secondary | ICD-10-CM | POA: Diagnosis not present

## 2022-10-22 DIAGNOSIS — W19XXXD Unspecified fall, subsequent encounter: Secondary | ICD-10-CM | POA: Diagnosis not present

## 2022-10-26 DIAGNOSIS — I959 Hypotension, unspecified: Secondary | ICD-10-CM | POA: Diagnosis not present

## 2022-10-26 DIAGNOSIS — N179 Acute kidney failure, unspecified: Secondary | ICD-10-CM | POA: Diagnosis not present

## 2022-10-26 DIAGNOSIS — S7291XD Unspecified fracture of right femur, subsequent encounter for closed fracture with routine healing: Secondary | ICD-10-CM | POA: Diagnosis not present

## 2022-10-26 DIAGNOSIS — M6281 Muscle weakness (generalized): Secondary | ICD-10-CM | POA: Diagnosis not present

## 2022-10-26 DIAGNOSIS — I130 Hypertensive heart and chronic kidney disease with heart failure and stage 1 through stage 4 chronic kidney disease, or unspecified chronic kidney disease: Secondary | ICD-10-CM | POA: Diagnosis not present

## 2022-10-26 DIAGNOSIS — W19XXXD Unspecified fall, subsequent encounter: Secondary | ICD-10-CM | POA: Diagnosis not present

## 2022-10-26 DIAGNOSIS — E876 Hypokalemia: Secondary | ICD-10-CM | POA: Diagnosis not present

## 2022-10-26 DIAGNOSIS — I69398 Other sequelae of cerebral infarction: Secondary | ICD-10-CM | POA: Diagnosis not present

## 2022-10-26 DIAGNOSIS — N390 Urinary tract infection, site not specified: Secondary | ICD-10-CM | POA: Diagnosis not present

## 2022-10-29 DIAGNOSIS — W19XXXD Unspecified fall, subsequent encounter: Secondary | ICD-10-CM | POA: Diagnosis not present

## 2022-10-29 DIAGNOSIS — I959 Hypotension, unspecified: Secondary | ICD-10-CM | POA: Diagnosis not present

## 2022-10-29 DIAGNOSIS — S72001D Fracture of unspecified part of neck of right femur, subsequent encounter for closed fracture with routine healing: Secondary | ICD-10-CM | POA: Diagnosis not present

## 2022-10-29 DIAGNOSIS — M25551 Pain in right hip: Secondary | ICD-10-CM | POA: Diagnosis not present

## 2022-10-29 DIAGNOSIS — M1611 Unilateral primary osteoarthritis, right hip: Secondary | ICD-10-CM | POA: Diagnosis not present

## 2022-10-29 DIAGNOSIS — M6281 Muscle weakness (generalized): Secondary | ICD-10-CM | POA: Diagnosis not present

## 2022-10-29 DIAGNOSIS — I130 Hypertensive heart and chronic kidney disease with heart failure and stage 1 through stage 4 chronic kidney disease, or unspecified chronic kidney disease: Secondary | ICD-10-CM | POA: Diagnosis not present

## 2022-10-29 DIAGNOSIS — N179 Acute kidney failure, unspecified: Secondary | ICD-10-CM | POA: Diagnosis not present

## 2022-10-29 DIAGNOSIS — N390 Urinary tract infection, site not specified: Secondary | ICD-10-CM | POA: Diagnosis not present

## 2022-10-29 DIAGNOSIS — I69398 Other sequelae of cerebral infarction: Secondary | ICD-10-CM | POA: Diagnosis not present

## 2022-10-29 DIAGNOSIS — S7291XD Unspecified fracture of right femur, subsequent encounter for closed fracture with routine healing: Secondary | ICD-10-CM | POA: Diagnosis not present

## 2022-10-29 DIAGNOSIS — E786 Lipoprotein deficiency: Secondary | ICD-10-CM | POA: Diagnosis not present

## 2022-10-29 DIAGNOSIS — E876 Hypokalemia: Secondary | ICD-10-CM | POA: Diagnosis not present

## 2022-11-01 DIAGNOSIS — N179 Acute kidney failure, unspecified: Secondary | ICD-10-CM | POA: Diagnosis not present

## 2022-11-01 DIAGNOSIS — W19XXXD Unspecified fall, subsequent encounter: Secondary | ICD-10-CM | POA: Diagnosis not present

## 2022-11-01 DIAGNOSIS — M6281 Muscle weakness (generalized): Secondary | ICD-10-CM | POA: Diagnosis not present

## 2022-11-01 DIAGNOSIS — E876 Hypokalemia: Secondary | ICD-10-CM | POA: Diagnosis not present

## 2022-11-01 DIAGNOSIS — I959 Hypotension, unspecified: Secondary | ICD-10-CM | POA: Diagnosis not present

## 2022-11-01 DIAGNOSIS — I69398 Other sequelae of cerebral infarction: Secondary | ICD-10-CM | POA: Diagnosis not present

## 2022-11-01 DIAGNOSIS — I130 Hypertensive heart and chronic kidney disease with heart failure and stage 1 through stage 4 chronic kidney disease, or unspecified chronic kidney disease: Secondary | ICD-10-CM | POA: Diagnosis not present

## 2022-11-01 DIAGNOSIS — N390 Urinary tract infection, site not specified: Secondary | ICD-10-CM | POA: Diagnosis not present

## 2022-11-01 DIAGNOSIS — S7291XD Unspecified fracture of right femur, subsequent encounter for closed fracture with routine healing: Secondary | ICD-10-CM | POA: Diagnosis not present

## 2022-11-03 DIAGNOSIS — E876 Hypokalemia: Secondary | ICD-10-CM | POA: Diagnosis not present

## 2022-11-03 DIAGNOSIS — M6281 Muscle weakness (generalized): Secondary | ICD-10-CM | POA: Diagnosis not present

## 2022-11-03 DIAGNOSIS — W19XXXD Unspecified fall, subsequent encounter: Secondary | ICD-10-CM | POA: Diagnosis not present

## 2022-11-03 DIAGNOSIS — I130 Hypertensive heart and chronic kidney disease with heart failure and stage 1 through stage 4 chronic kidney disease, or unspecified chronic kidney disease: Secondary | ICD-10-CM | POA: Diagnosis not present

## 2022-11-03 DIAGNOSIS — I69398 Other sequelae of cerebral infarction: Secondary | ICD-10-CM | POA: Diagnosis not present

## 2022-11-03 DIAGNOSIS — N390 Urinary tract infection, site not specified: Secondary | ICD-10-CM | POA: Diagnosis not present

## 2022-11-03 DIAGNOSIS — S7291XD Unspecified fracture of right femur, subsequent encounter for closed fracture with routine healing: Secondary | ICD-10-CM | POA: Diagnosis not present

## 2022-11-03 DIAGNOSIS — I959 Hypotension, unspecified: Secondary | ICD-10-CM | POA: Diagnosis not present

## 2022-11-03 DIAGNOSIS — N179 Acute kidney failure, unspecified: Secondary | ICD-10-CM | POA: Diagnosis not present

## 2022-11-08 DIAGNOSIS — M6281 Muscle weakness (generalized): Secondary | ICD-10-CM | POA: Diagnosis not present

## 2022-11-08 DIAGNOSIS — N390 Urinary tract infection, site not specified: Secondary | ICD-10-CM | POA: Diagnosis not present

## 2022-11-08 DIAGNOSIS — W19XXXD Unspecified fall, subsequent encounter: Secondary | ICD-10-CM | POA: Diagnosis not present

## 2022-11-08 DIAGNOSIS — I69398 Other sequelae of cerebral infarction: Secondary | ICD-10-CM | POA: Diagnosis not present

## 2022-11-08 DIAGNOSIS — I130 Hypertensive heart and chronic kidney disease with heart failure and stage 1 through stage 4 chronic kidney disease, or unspecified chronic kidney disease: Secondary | ICD-10-CM | POA: Diagnosis not present

## 2022-11-08 DIAGNOSIS — S7291XD Unspecified fracture of right femur, subsequent encounter for closed fracture with routine healing: Secondary | ICD-10-CM | POA: Diagnosis not present

## 2022-11-08 DIAGNOSIS — E876 Hypokalemia: Secondary | ICD-10-CM | POA: Diagnosis not present

## 2022-11-08 DIAGNOSIS — I959 Hypotension, unspecified: Secondary | ICD-10-CM | POA: Diagnosis not present

## 2022-11-08 DIAGNOSIS — N179 Acute kidney failure, unspecified: Secondary | ICD-10-CM | POA: Diagnosis not present

## 2022-11-11 DIAGNOSIS — S7291XD Unspecified fracture of right femur, subsequent encounter for closed fracture with routine healing: Secondary | ICD-10-CM | POA: Diagnosis not present

## 2022-11-11 DIAGNOSIS — W19XXXD Unspecified fall, subsequent encounter: Secondary | ICD-10-CM | POA: Diagnosis not present

## 2022-11-11 DIAGNOSIS — M6281 Muscle weakness (generalized): Secondary | ICD-10-CM | POA: Diagnosis not present

## 2022-11-11 DIAGNOSIS — I69398 Other sequelae of cerebral infarction: Secondary | ICD-10-CM | POA: Diagnosis not present

## 2022-11-11 DIAGNOSIS — N390 Urinary tract infection, site not specified: Secondary | ICD-10-CM | POA: Diagnosis not present

## 2022-11-11 DIAGNOSIS — E876 Hypokalemia: Secondary | ICD-10-CM | POA: Diagnosis not present

## 2022-11-11 DIAGNOSIS — I130 Hypertensive heart and chronic kidney disease with heart failure and stage 1 through stage 4 chronic kidney disease, or unspecified chronic kidney disease: Secondary | ICD-10-CM | POA: Diagnosis not present

## 2022-11-11 DIAGNOSIS — I959 Hypotension, unspecified: Secondary | ICD-10-CM | POA: Diagnosis not present

## 2022-11-11 DIAGNOSIS — N179 Acute kidney failure, unspecified: Secondary | ICD-10-CM | POA: Diagnosis not present

## 2022-11-12 DIAGNOSIS — I639 Cerebral infarction, unspecified: Secondary | ICD-10-CM | POA: Diagnosis not present

## 2022-11-15 DIAGNOSIS — S7291XD Unspecified fracture of right femur, subsequent encounter for closed fracture with routine healing: Secondary | ICD-10-CM | POA: Diagnosis not present

## 2022-11-15 DIAGNOSIS — W19XXXD Unspecified fall, subsequent encounter: Secondary | ICD-10-CM | POA: Diagnosis not present

## 2022-11-15 DIAGNOSIS — I69398 Other sequelae of cerebral infarction: Secondary | ICD-10-CM | POA: Diagnosis not present

## 2022-11-15 DIAGNOSIS — E876 Hypokalemia: Secondary | ICD-10-CM | POA: Diagnosis not present

## 2022-11-15 DIAGNOSIS — N179 Acute kidney failure, unspecified: Secondary | ICD-10-CM | POA: Diagnosis not present

## 2022-11-15 DIAGNOSIS — M6281 Muscle weakness (generalized): Secondary | ICD-10-CM | POA: Diagnosis not present

## 2022-11-15 DIAGNOSIS — N390 Urinary tract infection, site not specified: Secondary | ICD-10-CM | POA: Diagnosis not present

## 2022-11-15 DIAGNOSIS — I959 Hypotension, unspecified: Secondary | ICD-10-CM | POA: Diagnosis not present

## 2022-11-15 DIAGNOSIS — I130 Hypertensive heart and chronic kidney disease with heart failure and stage 1 through stage 4 chronic kidney disease, or unspecified chronic kidney disease: Secondary | ICD-10-CM | POA: Diagnosis not present

## 2022-11-15 NOTE — Addendum Note (Signed)
Encounter addended by: Oneida Arenas on: 11/15/2022 7:33 AM  Actions taken: Imaging Exam ended

## 2022-11-18 ENCOUNTER — Ambulatory Visit
Admit: 2022-11-18 | Discharge: 2022-11-18 | Payer: MEDICARE | Attending: Student in an Organized Health Care Education/Training Program | Primary: Student in an Organized Health Care Education/Training Program

## 2022-11-18 ENCOUNTER — Ambulatory Visit: Admit: 2022-11-18 | Discharge: 2022-11-18 | Payer: MEDICARE

## 2022-11-18 DIAGNOSIS — Z1159 Encounter for screening for other viral diseases: Principal | ICD-10-CM

## 2022-11-18 DIAGNOSIS — Z79899 Other long term (current) drug therapy: Principal | ICD-10-CM

## 2022-11-18 DIAGNOSIS — Z114 Encounter for screening for human immunodeficiency virus [HIV]: Principal | ICD-10-CM

## 2022-11-18 DIAGNOSIS — L400 Psoriasis vulgaris: Secondary | ICD-10-CM | POA: Diagnosis not present

## 2022-11-18 DIAGNOSIS — L409 Psoriasis, unspecified: Secondary | ICD-10-CM | POA: Diagnosis not present

## 2022-11-18 MED ORDER — CLOBETASOL 0.05 % SCALP SOLUTION
OPHTHALMIC | 5 refills | 0.00000 days | Status: CP
Start: 2022-11-18 — End: ?

## 2022-11-23 DIAGNOSIS — E785 Hyperlipidemia, unspecified: Secondary | ICD-10-CM | POA: Diagnosis not present

## 2022-11-23 DIAGNOSIS — L409 Psoriasis, unspecified: Secondary | ICD-10-CM | POA: Diagnosis not present

## 2022-11-23 DIAGNOSIS — F17210 Nicotine dependence, cigarettes, uncomplicated: Secondary | ICD-10-CM | POA: Diagnosis not present

## 2022-11-23 DIAGNOSIS — I10 Essential (primary) hypertension: Secondary | ICD-10-CM | POA: Diagnosis not present

## 2022-11-23 DIAGNOSIS — F4321 Adjustment disorder with depressed mood: Secondary | ICD-10-CM | POA: Diagnosis not present

## 2022-11-23 DIAGNOSIS — Z0000 Encounter for general adult medical examination without abnormal findings: Secondary | ICD-10-CM | POA: Diagnosis not present

## 2022-11-23 DIAGNOSIS — I679 Cerebrovascular disease, unspecified: Secondary | ICD-10-CM | POA: Diagnosis not present

## 2022-11-23 DIAGNOSIS — Z1331 Encounter for screening for depression: Secondary | ICD-10-CM | POA: Diagnosis not present

## 2022-11-24 DIAGNOSIS — E876 Hypokalemia: Secondary | ICD-10-CM | POA: Diagnosis not present

## 2022-11-24 DIAGNOSIS — N390 Urinary tract infection, site not specified: Secondary | ICD-10-CM | POA: Diagnosis not present

## 2022-11-24 DIAGNOSIS — I959 Hypotension, unspecified: Secondary | ICD-10-CM | POA: Diagnosis not present

## 2022-11-24 DIAGNOSIS — W19XXXD Unspecified fall, subsequent encounter: Secondary | ICD-10-CM | POA: Diagnosis not present

## 2022-11-24 DIAGNOSIS — N179 Acute kidney failure, unspecified: Secondary | ICD-10-CM | POA: Diagnosis not present

## 2022-11-24 DIAGNOSIS — S7291XD Unspecified fracture of right femur, subsequent encounter for closed fracture with routine healing: Secondary | ICD-10-CM | POA: Diagnosis not present

## 2022-11-24 DIAGNOSIS — I130 Hypertensive heart and chronic kidney disease with heart failure and stage 1 through stage 4 chronic kidney disease, or unspecified chronic kidney disease: Secondary | ICD-10-CM | POA: Diagnosis not present

## 2022-11-24 DIAGNOSIS — M6281 Muscle weakness (generalized): Secondary | ICD-10-CM | POA: Diagnosis not present

## 2022-11-24 DIAGNOSIS — I69398 Other sequelae of cerebral infarction: Secondary | ICD-10-CM | POA: Diagnosis not present

## 2022-11-29 DIAGNOSIS — N390 Urinary tract infection, site not specified: Secondary | ICD-10-CM | POA: Diagnosis not present

## 2022-11-29 DIAGNOSIS — I69398 Other sequelae of cerebral infarction: Secondary | ICD-10-CM | POA: Diagnosis not present

## 2022-11-29 DIAGNOSIS — N179 Acute kidney failure, unspecified: Secondary | ICD-10-CM | POA: Diagnosis not present

## 2022-11-29 DIAGNOSIS — I959 Hypotension, unspecified: Secondary | ICD-10-CM | POA: Diagnosis not present

## 2022-11-29 DIAGNOSIS — W19XXXD Unspecified fall, subsequent encounter: Secondary | ICD-10-CM | POA: Diagnosis not present

## 2022-11-29 DIAGNOSIS — I130 Hypertensive heart and chronic kidney disease with heart failure and stage 1 through stage 4 chronic kidney disease, or unspecified chronic kidney disease: Secondary | ICD-10-CM | POA: Diagnosis not present

## 2022-11-29 DIAGNOSIS — E876 Hypokalemia: Secondary | ICD-10-CM | POA: Diagnosis not present

## 2022-11-29 DIAGNOSIS — M6281 Muscle weakness (generalized): Secondary | ICD-10-CM | POA: Diagnosis not present

## 2022-11-29 DIAGNOSIS — S7291XD Unspecified fracture of right femur, subsequent encounter for closed fracture with routine healing: Secondary | ICD-10-CM | POA: Diagnosis not present

## 2022-12-06 DIAGNOSIS — W19XXXD Unspecified fall, subsequent encounter: Secondary | ICD-10-CM | POA: Diagnosis not present

## 2022-12-06 DIAGNOSIS — N390 Urinary tract infection, site not specified: Secondary | ICD-10-CM | POA: Diagnosis not present

## 2022-12-06 DIAGNOSIS — N179 Acute kidney failure, unspecified: Secondary | ICD-10-CM | POA: Diagnosis not present

## 2022-12-06 DIAGNOSIS — E876 Hypokalemia: Secondary | ICD-10-CM | POA: Diagnosis not present

## 2022-12-06 DIAGNOSIS — I959 Hypotension, unspecified: Secondary | ICD-10-CM | POA: Diagnosis not present

## 2022-12-06 DIAGNOSIS — I130 Hypertensive heart and chronic kidney disease with heart failure and stage 1 through stage 4 chronic kidney disease, or unspecified chronic kidney disease: Secondary | ICD-10-CM | POA: Diagnosis not present

## 2022-12-06 DIAGNOSIS — S7291XD Unspecified fracture of right femur, subsequent encounter for closed fracture with routine healing: Secondary | ICD-10-CM | POA: Diagnosis not present

## 2022-12-06 DIAGNOSIS — M6281 Muscle weakness (generalized): Secondary | ICD-10-CM | POA: Diagnosis not present

## 2022-12-06 DIAGNOSIS — I69398 Other sequelae of cerebral infarction: Secondary | ICD-10-CM | POA: Diagnosis not present

## 2022-12-09 DIAGNOSIS — M6281 Muscle weakness (generalized): Secondary | ICD-10-CM | POA: Diagnosis not present

## 2022-12-09 DIAGNOSIS — N390 Urinary tract infection, site not specified: Secondary | ICD-10-CM | POA: Diagnosis not present

## 2022-12-09 DIAGNOSIS — I959 Hypotension, unspecified: Secondary | ICD-10-CM | POA: Diagnosis not present

## 2022-12-09 DIAGNOSIS — S7291XD Unspecified fracture of right femur, subsequent encounter for closed fracture with routine healing: Secondary | ICD-10-CM | POA: Diagnosis not present

## 2022-12-09 DIAGNOSIS — N179 Acute kidney failure, unspecified: Secondary | ICD-10-CM | POA: Diagnosis not present

## 2022-12-09 DIAGNOSIS — I69398 Other sequelae of cerebral infarction: Secondary | ICD-10-CM | POA: Diagnosis not present

## 2022-12-09 DIAGNOSIS — W19XXXD Unspecified fall, subsequent encounter: Secondary | ICD-10-CM | POA: Diagnosis not present

## 2022-12-09 DIAGNOSIS — I130 Hypertensive heart and chronic kidney disease with heart failure and stage 1 through stage 4 chronic kidney disease, or unspecified chronic kidney disease: Secondary | ICD-10-CM | POA: Diagnosis not present

## 2022-12-09 DIAGNOSIS — E876 Hypokalemia: Secondary | ICD-10-CM | POA: Diagnosis not present

## 2023-01-24 ENCOUNTER — Other Ambulatory Visit: Payer: Self-pay

## 2023-01-24 ENCOUNTER — Emergency Department: Payer: Medicare HMO

## 2023-01-24 ENCOUNTER — Observation Stay
Admission: EM | Admit: 2023-01-24 | Discharge: 2023-01-25 | Disposition: A | Discharge status: Home / Self Care | Payer: Medicare HMO | Attending: Osteopathic Medicine | Admitting: Osteopathic Medicine

## 2023-01-24 DIAGNOSIS — R2689 Other abnormalities of gait and mobility: Secondary | ICD-10-CM | POA: Insufficient documentation

## 2023-01-24 DIAGNOSIS — E785 Hyperlipidemia, unspecified: Secondary | ICD-10-CM

## 2023-01-24 DIAGNOSIS — I6782 Cerebral ischemia: Secondary | ICD-10-CM | POA: Diagnosis not present

## 2023-01-24 DIAGNOSIS — R2681 Unsteadiness on feet: Secondary | ICD-10-CM | POA: Insufficient documentation

## 2023-01-24 DIAGNOSIS — M6281 Muscle weakness (generalized): Secondary | ICD-10-CM | POA: Diagnosis not present

## 2023-01-24 DIAGNOSIS — I160 Hypertensive urgency: Secondary | ICD-10-CM

## 2023-01-24 DIAGNOSIS — N1832 Chronic kidney disease, stage 3b: Secondary | ICD-10-CM

## 2023-01-24 DIAGNOSIS — I130 Hypertensive heart and chronic kidney disease with heart failure and stage 1 through stage 4 chronic kidney disease, or unspecified chronic kidney disease: Secondary | ICD-10-CM | POA: Diagnosis not present

## 2023-01-24 DIAGNOSIS — I679 Cerebrovascular disease, unspecified: Secondary | ICD-10-CM | POA: Diagnosis not present

## 2023-01-24 DIAGNOSIS — Z9181 History of falling: Secondary | ICD-10-CM | POA: Diagnosis not present

## 2023-01-24 DIAGNOSIS — E876 Hypokalemia: Secondary | ICD-10-CM

## 2023-01-24 DIAGNOSIS — I509 Heart failure, unspecified: Secondary | ICD-10-CM | POA: Diagnosis not present

## 2023-01-24 DIAGNOSIS — I6523 Occlusion and stenosis of bilateral carotid arteries: Secondary | ICD-10-CM | POA: Diagnosis not present

## 2023-01-24 DIAGNOSIS — Z1152 Encounter for screening for COVID-19: Secondary | ICD-10-CM | POA: Diagnosis not present

## 2023-01-24 DIAGNOSIS — I639 Cerebral infarction, unspecified: Secondary | ICD-10-CM

## 2023-01-24 DIAGNOSIS — R531 Weakness: Secondary | ICD-10-CM | POA: Diagnosis not present

## 2023-01-24 DIAGNOSIS — G459 Transient cerebral ischemic attack, unspecified: Secondary | ICD-10-CM | POA: Diagnosis not present

## 2023-01-24 DIAGNOSIS — G9389 Other specified disorders of brain: Secondary | ICD-10-CM | POA: Diagnosis not present

## 2023-01-24 DIAGNOSIS — I700 Atherosclerosis of aorta: Secondary | ICD-10-CM | POA: Diagnosis not present

## 2023-01-24 DIAGNOSIS — R29818 Other symptoms and signs involving the nervous system: Secondary | ICD-10-CM | POA: Diagnosis not present

## 2023-01-24 DIAGNOSIS — R9089 Other abnormal findings on diagnostic imaging of central nervous system: Secondary | ICD-10-CM | POA: Diagnosis not present

## 2023-01-24 DIAGNOSIS — R262 Difficulty in walking, not elsewhere classified: Secondary | ICD-10-CM | POA: Insufficient documentation

## 2023-01-24 LAB — BASIC METABOLIC PANEL
Anion gap: 11 (ref 5–15)
BUN: 21 mg/dL (ref 8–23)
CO2: 21 mmol/L — ABNORMAL LOW (ref 22–32)
Calcium: 9.1 mg/dL (ref 8.9–10.3)
Chloride: 106 mmol/L (ref 98–111)
Creatinine, Ser: 1.39 mg/dL — ABNORMAL HIGH (ref 0.44–1.00)
GFR, Estimated: 39 mL/min — ABNORMAL LOW (ref >60)
Glucose, Bld: 153 mg/dL — ABNORMAL HIGH (ref 70–99)
Potassium: 3.3 mmol/L — ABNORMAL LOW (ref 3.5–5.1)
Sodium: 138 mmol/L (ref 135–145)

## 2023-01-24 LAB — SARS CORONAVIRUS 2 BY RT PCR: SARS Coronavirus 2 by RT PCR: NEGATIVE

## 2023-01-24 LAB — CBC
HCT: 43.8 % (ref 36.0–46.0)
Hemoglobin: 14.6 g/dL (ref 12.0–15.0)
MCH: 31.8 pg (ref 26.0–34.0)
MCHC: 33.3 g/dL (ref 30.0–36.0)
MCV: 95.4 fL (ref 80.0–100.0)
Platelets: 288 10*3/uL (ref 150–400)
RBC: 4.59 MIL/uL (ref 3.87–5.11)
RDW: 12.8 % (ref 11.5–15.5)
WBC: 7.9 10*3/uL (ref 4.0–10.5)
nRBC: 0 % (ref 0.0–0.2)

## 2023-01-24 MED ORDER — CYANOCOBALAMIN 500 MCG PO TABS
1000.0000 ug | ORAL_TABLET | Freq: Every day | ORAL | Status: DC
  Administered 2023-01-25: 1000 ug via ORAL

## 2023-01-24 MED ORDER — MAGNESIUM HYDROXIDE 400 MG/5ML PO SUSP: 30.0000 mL | Freq: Every day | ORAL | Status: DC | PRN

## 2023-01-24 MED ORDER — LISINOPRIL 10 MG PO TABS
20.0000 mg | ORAL_TABLET | Freq: Every day | ORAL | Status: DC
  Administered 2023-01-25: 20 mg via ORAL
  Filled 2023-01-24: qty 2

## 2023-01-24 MED ORDER — IOHEXOL 350 MG/ML SOLN
75.0000 mL | Freq: Once | INTRAVENOUS | Status: AC | PRN
  Administered 2023-01-24: 75 mL via INTRAVENOUS

## 2023-01-24 MED ORDER — ACETAMINOPHEN 650 MG RE SUPP: 650.0000 mg | Freq: Four times a day (QID) | RECTAL | Status: DC | PRN

## 2023-01-24 MED ORDER — SODIUM CHLORIDE 0.9 % IV SOLN: INTRAVENOUS | Status: DC

## 2023-01-24 MED ORDER — TRAZODONE HCL 50 MG PO TABS: 25.0000 mg | ORAL_TABLET | Freq: Every evening | ORAL | Status: DC | PRN

## 2023-01-24 MED ORDER — ACETAMINOPHEN 325 MG PO TABS: 650.0000 mg | ORAL_TABLET | Freq: Four times a day (QID) | ORAL | Status: DC | PRN

## 2023-01-24 MED ORDER — ASPIRIN 81 MG PO TBEC: 81.0000 mg | DELAYED_RELEASE_TABLET | Freq: Every day | ORAL | Status: DC

## 2023-01-24 MED ORDER — CLOPIDOGREL BISULFATE 75 MG PO TABS
75.0000 mg | ORAL_TABLET | Freq: Every day | ORAL | Status: DC
  Administered 2023-01-25: 75 mg via ORAL
  Filled 2023-01-24: qty 1

## 2023-01-24 MED ORDER — ONDANSETRON HCL 4 MG/2ML IJ SOLN: 4.0000 mg | Freq: Four times a day (QID) | INTRAMUSCULAR | Status: DC | PRN

## 2023-01-24 MED ORDER — MECLIZINE HCL 25 MG PO TABS: 12.5000 mg | ORAL_TABLET | Freq: Three times a day (TID) | ORAL | Status: DC | PRN

## 2023-01-24 MED ORDER — ONDANSETRON HCL 4 MG PO TABS: 4.0000 mg | ORAL_TABLET | Freq: Four times a day (QID) | ORAL | Status: DC | PRN

## 2023-01-24 MED ORDER — MIRTAZAPINE 15 MG PO TABS
15.0000 mg | ORAL_TABLET | Freq: Every day | ORAL | Status: DC
  Administered 2023-01-24: 15 mg via ORAL
  Filled 2023-01-24: qty 1

## 2023-01-24 MED ORDER — ATORVASTATIN CALCIUM 20 MG PO TABS
80.0000 mg | ORAL_TABLET | Freq: Every day | ORAL | Status: DC
  Administered 2023-01-24 – 2023-01-25 (×2): 80 mg via ORAL
  Filled 2023-01-24 (×2): qty 4

## 2023-01-24 MED ORDER — STROKE: EARLY STAGES OF RECOVERY BOOK
Freq: Once | Status: AC
  Filled 2023-01-24: qty 1

## 2023-01-24 MED ORDER — ASPIRIN 81 MG PO TBEC
81.0000 mg | DELAYED_RELEASE_TABLET | Freq: Every day | ORAL | Status: DC
  Administered 2023-01-25: 81 mg via ORAL
  Filled 2023-01-24: qty 1

## 2023-01-24 MED ORDER — ENOXAPARIN SODIUM 40 MG/0.4ML IJ SOSY
40.0000 mg | PREFILLED_SYRINGE | INTRAMUSCULAR | Status: DC
  Administered 2023-01-24: 40 mg via SUBCUTANEOUS
  Filled 2023-01-24: qty 0.4

## 2023-01-24 MED ORDER — AMLODIPINE BESYLATE 5 MG PO TABS
10.0000 mg | ORAL_TABLET | Freq: Every evening | ORAL | Status: DC | PRN
  Filled 2023-01-24: qty 2

## 2023-01-24 NOTE — Assessment & Plan Note (Signed)
-   The patient has: 1. Multiple small foci of acute ischemia within the right basal ganglia and posterior limb of the right internal capsule. No acute hemorrhage or mass effect. 2. 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 - The patient will be admitted to a medically monitored bed.   - We will follow neuro checks q.4 hours for 24 hours.   - The patient will be placed on aspirin and Plavix.   - Will obtain a 2D echo with bubble study .   - A neurology consultation  as well as physical/occupation/speech therapy consults will be obtained in a.m.Marland Kitchen   - The patient will be placed on statin therapy and fasting lipids will be checked.

## 2023-01-24 NOTE — ED Triage Notes (Signed)
Pt here with weakness since Sat. Family states she had a birthday gathering Sat and began feeling weak as the day went on. Pt fell twice on yesterday, pt states she did not hit her head. Pt cannot stand on her own today, states whole left side is weak. Pt denies pain.

## 2023-01-24 NOTE — Assessment & Plan Note (Signed)
-   We will continue statin therapy and check fasting lipids. 

## 2023-01-24 NOTE — ED Notes (Signed)
See triage note, pt reports generalized weakness since Saturday, worsened weakness to left side. Pt alert and oriented. Clear speech. Difficulty walking noted. Reports recent falls d/t weakness

## 2023-01-24 NOTE — ED Provider Notes (Addendum)
[facility] Provider Note    Event Date/Time   First MD Initiated Contact with Patient 01/24/23 1607     (approximate)   History   Weakness   HPI  Victoria Gutierrez is a 77 y.o. female with a history of remote CVA presents to the ER for evaluation of difficulty walking left greater than right weakness over the past few days.  States that she was at a birthday party over the weekend he states Friday evening has had trouble walking without significant assistance.  No report of any shortness of breath or cough no dysuria.  No falls.  States that she feels like her symptoms are similar to when she had a previous stroke.     Physical Exam   Triage Vital Signs: ED Triage Vitals  Encounter Vitals Group     BP 01/24/23 1415 122/79     Systolic BP Percentile --      Diastolic BP Percentile --      Pulse Rate 01/24/23 1413 88     Resp 01/24/23 1413 18     Temp 01/24/23 1413 98.2 F (36.8 C)     Temp Source 01/24/23 1413 Oral     SpO2 01/24/23 1413 99 %     Weight 01/24/23 1414 160 lb 7.9 oz (72.8 kg)     Height 01/24/23 1414 5\' 7"  (1.702 m)     Head Circumference --      Peak Flow --      Pain Score 01/24/23 1414 0     Pain Loc --      Pain Education --      Exclude from Growth Chart --     Most recent vital signs: Vitals:   01/24/23 1630 01/24/23 1700  BP: (!) 150/91 (!) 168/88  Pulse: 73   Resp: (!) 21 (!) 22  Temp:    SpO2: 94%      Constitutional: Alert  Eyes: Conjunctivae are normal.  Head: Atraumatic. Nose: No congestion/rhinnorhea. Mouth/Throat: Mucous membranes are moist.   Neck: Painless ROM.  Cardiovascular:   Good peripheral circulation. Respiratory: Normal respiratory effort.  No retractions.  Gastrointestinal: Soft and nontender.  Musculoskeletal:  no deformity Neurologic:  lue drift,  lle falls against gravity <10 sec.  Rt 10 sec.  No droop Skin:  Skin is warm, dry and intact. No rash noted. Psychiatric: Mood and affect are  normal. Speech and behavior are normal.    ED Results / Procedures / Treatments   Labs (all labs ordered are listed, but only abnormal results are displayed) Labs Reviewed  BASIC METABOLIC PANEL - Abnormal; Notable for the following components:      Result Value   Potassium 3.3 (*)    CO2 21 (*)    Glucose, Bld 153 (*)    Creatinine, Ser 1.39 (*)    GFR, Estimated 39 (*)    All other components within normal limits  SARS CORONAVIRUS 2 BY RT PCR  CBC  URINALYSIS, ROUTINE W REFLEX MICROSCOPIC  CBG MONITORING, ED     EKG  ED ECG REPORT I, Willy Eddy, the attending physician, personally viewed and interpreted this ECG.   Date: 01/24/2023  EKG Time: 14:20  Rate: 85  Rhythm: sinus  Axis: normal  Intervals:rbbb  ST&T Change: no stemi, abnml ekg    RADIOLOGY Please see ED Course for my review and interpretation.  I personally reviewed all radiographic images ordered to evaluate for the above acute complaints and reviewed  radiology reports and findings.  These findings were personally discussed with the patient.  Please see medical record for radiology report.    PROCEDURES:  Critical Care performed: No  Procedures   MEDICATIONS ORDERED IN ED: Medications - No data to display   IMPRESSION / MDM / ASSESSMENT AND PLAN / ED COURSE  I reviewed the triage vital signs and the nursing notes.                              Differential diagnosis includes, but is not limited to, Dehydration, sepsis, pna, uti, hypoglycemia, cva, drug effect,   Patient presenting to the ER for evaluation of symptoms as described above.  Based on symptoms, risk factors and considered above differential, this presenting complaint could reflect a potentially life-threatening illness therefore the patient will be placed on continuous pulse oximetry and telemetry for monitoring.  Laboratory evaluation will be sent to evaluate for the above complaints.      Clinical Course as of 01/24/23  1958  Mon Jan 24, 2023  1957 MRI with evidence of acute infarct.  Will consult hospitalist for admission. [PR]    Clinical Course User Index [PR] Willy Eddy, MD     FINAL CLINICAL IMPRESSION(S) / ED DIAGNOSES   Final diagnoses:  Cerebrovascular accident (CVA), unspecified mechanism (HCC)     Rx / DC Orders   ED Discharge Orders     None        Note:  This document was prepared using Dragon voice recognition software and may include unintentional dictation errors.    Willy Eddy, MD 01/24/23 Babette Relic    Willy Eddy, MD 01/24/23 2001

## 2023-01-24 NOTE — ED Notes (Signed)
MRI screening patient on phone at this time 

## 2023-01-24 NOTE — Assessment & Plan Note (Signed)
-   Potassium will be replaced and magnesium level will be checked. ?

## 2023-01-24 NOTE — Assessment & Plan Note (Signed)
-   Creatinine is better than previous levels. - We will gently hydrate her and continue following.

## 2023-01-24 NOTE — Assessment & Plan Note (Signed)
-   We will allow permissive hypertension given her evolving right caudate infarct, unless she is symptomatic specially that her symptoms started on Saturday night.

## 2023-01-24 NOTE — ED Notes (Signed)
Patient transported to MRI 

## 2023-01-24 NOTE — H&P (Signed)
[place]   PATIENT NAME: Victoria Gutierrez    MR#:  [id number]  DATE OF BIRTH:  1946-06-23  DATE OF ADMISSION:  01/24/2023  PRIMARY CARE PHYSICIAN: Jerl Mina, MD   Patient is coming from: Home  REQUESTING/REFERRING PHYSICIAN: Willy Eddy, MD  CHIEF COMPLAINT:   Chief Complaint  Patient presents with   Weakness    HISTORY OF PRESENT ILLNESS:  Victoria Gutierrez is a 77 y.o. Caucasian female with medical history significant for osteoarthritis, hypertension, and CVA who presented to the emergency room with acute onset of difficulty with ambulation with left greater than right weakness over the last few days.  She was at a birthday party over the weekend on Friday night when she had trouble walking without significant assistance.  No headache or dizziness or blurred vision.  No other paresthesias.  No tinnitus or vertigo.  No weakness seizures.  No urinary or stool incontinence. She denies any falls.  Her symptoms felt like when she had a stroke in the past.  No chest pain or palpitations.  No cough or wheezing or dyspnea.  No bleeding diathesis.  ED Course: When she came to the ER, vital signs were within normal.  Labs revealed mild hypokalemia of 3.3 with creatinine of 1.39 compared to 1.28 on 10/20/2022.  And CBC with within normal.  COVID-19 PCR came back negative. EKG as reviewed by me : EKG showed sinus rhythm with a rate of 86 with PVCs and right bundle branch block, anterior and inferior Q waves and T wave inversion laterally. Imaging: Noncontrasted head CT scan showed chronic ischemic changes with no acute intracranial normalities.    Brain MRI without contrast revealed the following: 1. Multiple small foci of acute ischemia within the right basal ganglia and posterior limb of the right internal capsule. No acute hemorrhage or mass effect. 2. 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 3. Multiple chronic microhemorrhages in a predominantly  central distribution, consistent with chronic hypertensive angiopathy.  Head and neck CTA revealed the following: 1. Unchanged left M1 occlusion with diminutive distal branches. 2. 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 3. Bilateral carotid bifurcation atherosclerosis without hemodynamically significant stenosis. 4.  Aortic Atherosclerosis  The patient was admitted to a medical telemetry bed for further evaluation and management.   PAST MEDICAL HISTORY:   Past Medical History:  Diagnosis Date   Arthritis    knees, hands   H/O thyroid disease    No issues currently   HOH (hard of hearing)    Hypertension    Stroke (HCC) 12/2018   Wears hearing aid in both ears    has, does not wear    PAST SURGICAL HISTORY:   Past Surgical History:  Procedure Laterality Date   ABDOMINAL HYSTERECTOMY     CATARACT EXTRACTION W/PHACO Right 09/18/2019   Procedure: CATARACT EXTRACTION PHACO AND INTRAOCULAR LENS PLACEMENT (IOC) RIGHT 11.65  01:03.3;  Surgeon: Galen Manila, MD;  Location: [facility];  Service: Ophthalmology;  Laterality: Right;   CATARACT EXTRACTION W/PHACO Left 10/09/2019   Procedure: CATARACT EXTRACTION PHACO AND INTRAOCULAR LENS PLACEMENT (IOC) LEFT 10.30  00:48.5;  Surgeon: Galen Manila, MD;  Location: [facility];  Service: Ophthalmology;  Laterality: Left;   TOTAL HIP ARTHROPLASTY Left 04/05/2019   Procedure: TOTAL HIP ARTHROPLASTY ANTERIOR APPROACH;  Surgeon: Kennedy Bucker, MD;  Location: [facility];  Service: Orthopedics;  Laterality: Left;    SOCIAL HISTORY:  Social History   Tobacco Use   Smoking status: Every Day    Current packs/day: 0.50    Average packs/day: 0.5 packs/day for 38.0 years (19.0 ttl pk-yrs)    Types: Cigarettes    Start date: 03/10/1987   Smokeless tobacco: Never   Tobacco comments:    since age 13  Substance Use Topics   Alcohol use: No    FAMILY HISTORY:   Family History   Problem Relation Age of Onset   Heart disease Mother    Heart disease Father    Breast cancer Neg Hx     DRUG ALLERGIES:  No Known Allergies  REVIEW OF SYSTEMS:   ROS As per history of present illness. All pertinent systems were reviewed above. Constitutional, HEENT, cardiovascular, respiratory, GI, GU, musculoskeletal, neuro, psychiatric, endocrine, integumentary and hematologic systems were reviewed and are otherwise negative/unremarkable except for positive findings mentioned above in the HPI.   MEDICATIONS AT HOME:   Prior to Admission medications   Medication Sig Start Date End Date Taking? Authorizing Provider  acetaminophen (TYLENOL) 325 MG tablet Take 2 tablets (650 mg total) by mouth every 6 (six) hours as needed for moderate pain, mild pain, headache or fever. 10/20/22 10/20/23  Gillis Santa, MD  amLODipine (NORVASC) 5 MG tablet Take 2 tablets (10 mg total) by mouth at bedtime as needed (Systolic BP greater than 150 mmHg). 10/20/22 10/20/23  Gillis Santa, MD  atorvastatin (LIPITOR) 80 MG tablet Take 1 tablet (80 mg total) by mouth daily at 6 PM. 02/28/22   Lonia Blood, MD  clopidogrel (PLAVIX) 75 MG tablet Take 1 tablet (75 mg total) by mouth daily. 10/20/22 10/20/23  Gillis Santa, MD  cyanocobalamin (VITAMIN B12) 1000 MCG tablet Take 1 tablet (1,000 mcg total) by mouth daily. 10/20/22 04/18/23  Gillis Santa, MD  lisinopril (ZESTRIL) 20 MG tablet Take 1 tablet (20 mg total) by mouth daily. Skip the dose if systolic BP less than 130 mmHg 10/20/22 10/20/23  Gillis Santa, MD  meclizine (ANTIVERT) 12.5 MG tablet Take 12.5 mg by mouth 3 (three) times daily as needed for dizziness. 10/07/22   [provider]      VITAL SIGNS:  Blood pressure 129/72, pulse 81, temperature 97.7 F (36.5 C), temperature source Oral, resp. rate (!) 21, height 5\' 7"  (1.702 m), weight 72.8 kg, SpO2 93%.  PHYSICAL EXAMINATION:  Physical Exam  GENERAL:  77 y.o.-year-old Caucasian female   patient lying in the bed with no acute distress.  EYES: Pupils equal, round, reactive to light and accommodation. No scleral icterus. Extraocular muscles intact.  HEENT: Head atraumatic, normocephalic. Oropharynx and nasopharynx clear.  NECK:  Supple, no jugular venous distention. No thyroid enlargement, no tenderness.  LUNGS: Normal breath sounds bilaterally, no wheezing, rales,rhonchi or crepitation. No use of accessory muscles of respiration.  CARDIOVASCULAR: Regular rate and rhythm, S1, S2 normal. No murmurs, rubs, or gallops.  ABDOMEN: Soft, nondistended, nontender. Bowel sounds present. No organomegaly or mass.  EXTREMITIES: No pedal edema, cyanosis, or clubbing.  NEUROLOGIC: Cranial nerves II through XII are intact except for minimal left facial droop. Muscle strength 5/5 in right upper and lower extremities compared to 4/5 in left upper extremity and 3/5 in the left lower extremity.  Sensation intact. Gait not checked.  PSYCHIATRIC: The patient is alert and oriented x 3.  Normal affect and good eye contact. SKIN: No obvious rash, lesion, or ulcer.   LABORATORY PANEL:   CBC Recent Labs  Lab 01/24/23 1424  WBC  7.9  HGB 14.6  HCT 43.8  PLT 288   ------------------------------------------------------------------------------------------------------------------  Chemistries  Recent Labs  Lab 01/24/23 1424  NA 138  K 3.3*  CL 106  CO2 21*  GLUCOSE 153*  BUN 21  CREATININE 1.39*  CALCIUM 9.1   ------------------------------------------------------------------------------------------------------------------  Cardiac Enzymes No results for input(s): "TROPONINI" in the last 168 hours. ------------------------------------------------------------------------------------------------------------------  RADIOLOGY:  CT ANGIO HEAD NECK W WO CM  Result Date: 01/24/2023 CLINICAL DATA:  Acute neurologic deficit.  Weakness and fall. EXAM: CT ANGIOGRAPHY HEAD AND NECK WITH AND WITHOUT  CONTRAST TECHNIQUE: Multidetector CT imaging of the head and neck was performed using the standard protocol during bolus administration of intravenous contrast. Multiplanar CT image reconstructions and MIPs were obtained to evaluate the vascular anatomy. Carotid stenosis measurements (when applicable) are obtained utilizing NASCET criteria, using the distal internal carotid diameter as the denominator. RADIATION DOSE REDUCTION: This exam was performed according to the departmental dose-optimization program which includes automated exposure control, adjustment of the mA and/or kV according to patient size and/or use of iterative reconstruction technique. CONTRAST:  75mL OMNIPAQUE IOHEXOL 350 MG/ML SOLN COMPARISON:  02/26/2022 FINDINGS: CTA NECK FINDINGS SKELETON: There is no bony spinal canal stenosis. No lytic or blastic lesion. OTHER NECK: Normal pharynx, larynx and major salivary glands. No cervical lymphadenopathy. Unremarkable thyroid gland. UPPER CHEST: No pneumothorax or pleural effusion. No nodules or masses. AORTIC ARCH: There is calcific atherosclerosis of the aortic arch. Conventional 3 vessel aortic branching pattern. RIGHT CAROTID SYSTEM: No dissection, occlusion or aneurysm. There is calcified atherosclerosis extending into the proximal ICA, resulting in less than 50% stenosis. LEFT CAROTID SYSTEM: No dissection, occlusion or aneurysm. There is mixed density atherosclerosis at the bifurcation and extending into the proximal ICA, resulting in less than 50% stenosis. VERTEBRAL ARTERIES: Right dominant configuration.Diffusely diminutive left vertebral artery. CTA HEAD FINDINGS POSTERIOR CIRCULATION: --Vertebral arteries: Left vertebral artery terminates in PICA. There is moderate atherosclerosis of the right V3 and V4 segments without high-grade stenosis. --Inferior cerebellar arteries: Normal. --Basilar artery: Basilar artery.  Fetal origins of both PCAs. --Superior cerebellar arteries: Normal.  --Posterior cerebral arteries (PCA): Normal. ANTERIOR CIRCULATION: --Intracranial internal carotid arteries: Atherosclerotic calcification of the internal carotid arteries at the skull base without hemodynamically significant stenosis. --Anterior cerebral arteries (ACA): Normal. 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 Unchanged left M1 occlusion with diminutive distal branches. VENOUS SINUSES: As permitted by contrast timing, patent. ANATOMIC VARIANTS: None Review of the MIP images confirms the above findings. IMPRESSION: 1. Unchanged left M1 occlusion with diminutive distal branches. 2. 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 3. Bilateral carotid bifurcation atherosclerosis without hemodynamically significant stenosis. Aortic Atherosclerosis (ICD10-I70.0). Electronically Signed   By: Deatra Robinson M.D.   On: 01/24/2023 22:03   MR BRAIN WO CONTRAST  Result Date: 01/24/2023 CLINICAL DATA:  Weakness and multiple falls EXAM: MRI HEAD WITHOUT CONTRAST TECHNIQUE: Multiplanar, multiecho pulse sequences of the brain and surrounding structures were obtained without intravenous contrast. COMPARISON:  10/18/2022 FINDINGS: Brain: Multiple small foci of acute ischemia within the right basal ganglia and posterior limb of the right internal capsule. 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 Multiple chronic microhemorrhages in a predominantly central distribution. There is multifocal hyperintense T2-weighted signal within the white matter. Generalized volume loss. Old right cerebellar infarct. The midline structures are normal. Vascular: Major flow voids are preserved. Skull and  upper cervical spine: Normal calvarium and skull base. Visualized upper cervical spine and soft tissues are normal. Sinuses/Orbits:Left maxillary retention cyst.  Normal orbits. IMPRESSION:  1. Multiple small foci of acute ischemia within the right basal ganglia and posterior limb of the right internal capsule. No acute hemorrhage or mass effect. 2. 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 3. Multiple chronic microhemorrhages in a predominantly central distribution, consistent with chronic hypertensive angiopathy. Electronically Signed   By: Deatra Robinson M.D.   On: 01/24/2023 19:25   CT HEAD WO CONTRAST ( )  Result Date: 01/24/2023 CLINICAL DATA:  TIA.  Weakness. EXAM: CT HEAD WITHOUT CONTRAST TECHNIQUE: Contiguous axial images were obtained from the base of the skull through the vertex without intravenous contrast. RADIATION DOSE REDUCTION: This exam was performed according to the departmental dose-optimization program which includes automated exposure control, adjustment of the mA and/or kV according to patient size and/or use of iterative reconstruction technique. COMPARISON:  Head CT dated 10/18/2022 FINDINGS: Brain: Ventricles are stable in size and configuration. There is generalized age related parenchymal volume loss with commensurate dilatation of the ventricles and sulci. Chronic small vessel ischemic changes are again seen within the bilateral periventricular and subcortical white matter regions. Again noted are chronic infarcts within the bilateral basal ganglia regions, RIGHT greater than LEFT. There is no mass, hemorrhage, edema or other evidence of acute parenchymal abnormality. No extra-axial hemorrhage. Vascular: Chronic calcified atherosclerotic changes of the large vessels at the skull base. No unexpected hyperdense vessel. Skull: Normal. Negative for fracture or focal lesion. Sinuses/Orbits: Chronic opacification of the LEFT maxillary sinus, likely a partially imaged chronic mucous retention cyst. Remainder of the visualized upper paranasal sinuses are clear. Visualized upper periorbital and retro-orbital soft tissues are unremarkable. Other: None. IMPRESSION:  1. No acute findings. No intracranial mass, hemorrhage or edema. 2. Chronic ischemic changes, as detailed above. Electronically Signed   By: Bary Richard M.D.   On: 01/24/2023 15:27      IMPRESSION AND PLAN:  Assessment and Plan: * Acute/subacute CVA - The patient has: 1. Multiple small foci of acute ischemia within the right basal ganglia and posterior limb of the right internal capsule. No acute hemorrhage or mass effect. 2. 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 - The patient will be admitted to a medically monitored bed.   - We will follow neuro checks q.4 hours for 24 hours.   - The patient will be placed on aspirin and Plavix.   - Will obtain a 2D echo with bubble study .   - A neurology consultation  as well as physical/occupation/speech therapy consults will be obtained in a.m.Marland Kitchen   - The patient will be placed on statin therapy and fasting lipids will be checked.   Hypertensive urgency - We will allow permissive hypertension given her evolving right caudate infarct, unless she is symptomatic specially that her symptoms started on Saturday night.  Hypokalemia - Potassium will be replaced and magnesium level will be checked.  Stage 3b chronic kidney disease (CKD) (HCC) - Creatinine is better than previous levels. - We will gently hydrate her and continue following.  Dyslipidemia - We will continue statin therapy and check fasting lipids.    DVT prophylaxis: Lovenox.  Advanced Care Planning:  Code Status: full code.  Family Communication:  The plan of care was discussed in details with the patient (and family). I answered all questions. The patient agreed to proceed with the above mentioned plan. Further management  will depend upon hospital course. Disposition Plan: Back to previous home environment Consults called: Neurology All the records are reviewed and case discussed with ED provider.  Status is: Inpatient  At the time of the admission, it  appears that the appropriate admission status for this patient is inpatient.  This is judged to be reasonable and necessary in order to provide the required intensity of service to ensure the patient's safety given the presenting symptoms, physical exam findings and initial radiographic and laboratory data in the context of comorbid conditions.  The patient requires inpatient status due to high intensity of service, high risk of further deterioration and high frequency of surveillance required.  I certify that at the time of admission, it is my clinical judgment that the patient will require inpatient hospital care extending more than 2 midnights.                            Dispo: The patient is from: Home              Anticipated d/c is to: Home              Patient currently is not medically stable to d/c.              Difficult to place patient: No  Hannah Beat M.D on 01/24/2023 at 10:44 PM  [facility]   From 7 PM-7 AM, contact night-coverage [email or web address]  CC: Primary care physician; Jerl Mina, MD

## 2023-01-25 ENCOUNTER — Inpatient Hospital Stay (HOSPITAL_BASED_OUTPATIENT_CLINIC_OR_DEPARTMENT_OTHER)
Admit: 2023-01-25 | Discharge: 2023-01-25 | Disposition: A | Discharge status: Home / Self Care | Payer: Medicare HMO | Attending: Family Medicine | Admitting: Family Medicine

## 2023-01-25 ENCOUNTER — Other Ambulatory Visit: Payer: Medicare HMO

## 2023-01-25 ENCOUNTER — Encounter: Payer: Self-pay | Admitting: Family Medicine

## 2023-01-25 DIAGNOSIS — I6389 Other cerebral infarction: Secondary | ICD-10-CM

## 2023-01-25 DIAGNOSIS — I639 Cerebral infarction, unspecified: Secondary | ICD-10-CM | POA: Diagnosis present

## 2023-01-25 MED ORDER — POTASSIUM CHLORIDE CRYS ER 20 MEQ PO TBCR
40.0000 meq | EXTENDED_RELEASE_TABLET | Freq: Two times a day (BID) | ORAL | Status: DC
  Administered 2023-01-25: 40 meq via ORAL
  Filled 2023-01-25: qty 2

## 2023-01-25 MED ORDER — LABETALOL HCL 5 MG/ML IV SOLN
20.0000 mg | INTRAVENOUS | Status: DC | PRN
  Administered 2023-01-25: 20 mg via INTRAVENOUS
  Filled 2023-01-25: qty 4

## 2023-01-25 MED ORDER — ATORVASTATIN CALCIUM 40 MG PO TABS: 40.0000 mg | ORAL_TABLET | Freq: Every day | ORAL | 0 refills | Status: AC

## 2023-01-25 NOTE — Progress Notes (Signed)
SLP Cancellation Note  Patient Details Name: Victoria Gutierrez MRN: [id number] DOB: 22-Apr-1946   Cancelled treatment:       Reason Eval/Treat Not Completed: SLP screened, no needs identified, will sign off (chart reviewed; consulted NSG re: pt then met w/ pt in room.)  Pt admitted w/ Acute/subacute CVA; pt endorsed H/O prior CVAs. Pt denied any difficulty swallowing and passed the [facility] swallow screen w/ NSG last night. MD to place diet order. She swallows her pills w/ water at home w/out difficulty per her report.   Pt conversed in general conversation w/out overt expressive/receptive deficits noted; pt denied any new speech-language deficits. Speech intelligible, clear. Pt is HOH and not wearing her HAs at this time but could hear adequately w/ reducing room noise and raising voice/volume just a bit when asking questions of pt. No gross unilateral OM weakness apparent.  No further skilled ST services indicated as pt appears at her communication baseline. Pt agreed. NSG to reconsult if any change in status while admitted.     Jerilynn Som, MS, CCC-SLP Speech Language Pathologist Rehab Services; [facility] [phone] (ascom) Carlen Fils 01/25/2023, 8:49 AM

## 2023-01-25 NOTE — ED Notes (Signed)
Patient resting with eyes closed. No signs of distress. IV intact with NS running at 100/hr. Skin warm and dry.

## 2023-01-25 NOTE — Hospital Course (Signed)
***    Consultants:  ***  Procedures: ***      ASSESSMENT & PLAN:   Principal Problem:   Acute/subacute CVA Active Problems:   Hypertensive urgency   Hypokalemia   Dyslipidemia   Stage 3b chronic kidney disease (CKD) (HCC)   Acute/subacute CVA - The patient has: 1. Multiple small foci of acute ischemia within the right basal ganglia and posterior limb of the right internal capsule. No acute hemorrhage or mass effect. 2. 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 - The patient will be admitted to a medically monitored bed.   - We will follow neuro checks q.4 hours for 24 hours.   - The patient will be placed on aspirin and Plavix.   - Will obtain a 2D echo with bubble study .   - A neurology consultation  as well as physical/occupation/speech therapy consults will be obtained in a.m.Marland Kitchen   - The patient will be placed on statin therapy and fasting lipids will be checked.   Hypokalemia - Potassium will be replaced and magnesium level will be checked.  Dyslipidemia - We will continue statin therapy and check fasting lipids.  Hypertensive urgency - We will allow permissive hypertension given her evolving right caudate infarct, unless she is symptomatic specially that her symptoms started on Saturday night.  Stage 3b chronic kidney disease (CKD) (HCC) - Creatinine is better than previous levels. - We will gently hydrate her and continue following.    DVT prophylaxis: *** Pertinent IV fluids/nutrition: *** Central lines / invasive devices: ***  Code Status: *** ACP documentation reviewed: ***  Current Admission Status: ***  TOC needs / Dispo plan: *** Barriers to discharge / significant pending items: ***

## 2023-01-25 NOTE — Care Management Obs Status (Signed)
MEDICARE OBSERVATION STATUS NOTIFICATION   Patient Details  Name: LAYTEN KLEPP MRN: [id number] Date of Birth: 1946/03/09   Medicare Observation Status Notification Given:  Yes    Lavenia Atlas, RN 01/25/2023, 5:19 PM

## 2023-01-25 NOTE — Progress Notes (Signed)
*  PRELIMINARY RESULTS* Echocardiogram 2D Echocardiogram has been performed.  Carolyne Fiscal 01/25/2023, 12:14 PM

## 2023-01-25 NOTE — ED Notes (Signed)
Patient voided in bed. Clean patient and put on new gown. PT present to get her up into the chair.

## 2023-01-25 NOTE — Evaluation (Signed)
Physical Therapy Evaluation Patient Details Name: Victoria Gutierrez MRN: [id number] DOB: 04/05/1946 Today's Date: 01/25/2023  History of Present Illness  Victoria Gutierrez is a 77 y.o. Caucasian female with medical history significant for osteoarthritis, hypertension, and CVA who presented to the emergency room with acute onset of difficulty with ambulation with left greater than right weakness over the last few days .Brain MRI without contrast revealed the following:  1. Multiple small foci of acute ischemia within the right basal  ganglia and posterior limb of the right internal capsule. No acute  hemorrhage or mass effect.  2. 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  3. Multiple chronic microhemorrhages in a predominantly central  distribution, consistent with chronic hypertensive angiopathy.   Clinical Impression  Patient received in bed in ED with daughter at bedside. She is agreeable to PT/OT assessment. Patient reports increased weakness in B LEs, with L >R. She required mod assist for bed mobility, min A +2 for sit to stand transfer and step pivot transfer to recliner. Patient reports sensation is wnl. She will continue to benefit from skilled PT to improve strength and functional independence.          If plan is discharge home, recommend the following: A lot of help with walking and/or transfers;A lot of help with bathing/dressing/bathroom;Help with stairs or ramp for entrance;Assist for transportation   Can travel by private vehicle        Equipment Recommendations None recommended by PT  Recommendations for Other Services       Functional Status Assessment Patient has had a recent decline in their functional status and demonstrates the ability to make significant improvements in function in a reasonable and predictable amount of time.     Precautions / Restrictions Precautions Precautions: Fall Restrictions Weight Bearing Restrictions: No      Mobility  Bed  Mobility Overal bed mobility: Needs Assistance Bed Mobility: Supine to Sit     Supine to sit: Mod assist, HOB elevated          Transfers Overall transfer level: Needs assistance Equipment used: 2 person hand held assist Transfers: Sit to/from Stand, Bed to chair/wheelchair/BSC Sit to Stand: Min assist, +2 physical assistance   Step pivot transfers: Min assist, +2 physical assistance            Ambulation/Gait                  Stairs            Wheelchair Mobility     Tilt Bed    Modified Rankin (Stroke Patients Only)       Balance Overall balance assessment: Needs assistance Sitting-balance support: Feet supported Sitting balance-Leahy Scale: Good     Standing balance support: Bilateral upper extremity supported, During functional activity, Reliant on assistive device for balance Standing balance-Leahy Scale: Fair Standing balance comment: requires B UE support with standing                             Pertinent Vitals/Pain Pain Assessment Pain Assessment: No/denies pain    Home Living Family/patient expects to be discharged to:: Private residence Living Arrangements: Children;Non-relatives/Friends Available Help at Discharge: Family;Available 24 hours/day;Personal care attendant Type of Home: House Home Access: Stairs to enter Entrance Stairs-Rails: Right;Left;Can reach both Entrance Stairs-Number of Steps: 5   Home Layout: One level Home Equipment: Rollator (4 wheels);Rolling Walker (2 wheels);Shower seat;Other (comment);BSC/3in1;Hand held shower head  Additional Comments: Hired caregiver during the day; daughter home at night    Prior Function Prior Level of Function : Needs assist;History of Falls (last six months)             Mobility Comments: Pt reports she is able to ambulate household distances using RW. Reports multiple falls in las 6 months including 1 earlier this week. ADLs Comments: Assist from daughter  for ADLs (bathing, LB dressing) and IADLs (household chores, transportation, meals, med mgmt). IND for self-feeding. Wears depends at baseline.     Hand Dominance   Dominant Hand: Right    Extremity/Trunk Assessment   Upper Extremity Assessment Upper Extremity Assessment: Generalized weakness;LUE deficits/detail LUE Deficits / Details: baseline weakness, decreased AROM of L shoulder.    Lower Extremity Assessment Lower Extremity Assessment: Generalized weakness    Cervical / Trunk Assessment Cervical / Trunk Assessment: Normal  Communication   Communication: HOH  Cognition Arousal/Alertness: Awake/alert Behavior During Therapy: WFL for tasks assessed/performed                                   General Comments: A & O        General Comments      Exercises     Assessment/Plan    PT Assessment Patient needs continued PT services  PT Problem List Decreased strength;Decreased activity tolerance;Decreased balance;Decreased mobility       PT Treatment Interventions DME instruction;Gait training;Functional mobility training;Therapeutic activities;Stair training;Patient/family education;Balance training;Therapeutic exercise;Neuromuscular re-education    PT Goals (Current goals can be found in the Care Plan section)  Acute Rehab PT Goals Patient Stated Goal: to return home PT Goal Formulation: With patient/family Time For Goal Achievement: 02/08/23 Potential to Achieve Goals: Good    Frequency Min 1X/week     Co-evaluation PT/OT/SLP Co-Evaluation/Treatment: Yes Reason for Co-Treatment: For patient/therapist safety;To address functional/ADL transfers PT goals addressed during session: Mobility/safety with mobility;Balance         AM-PAC PT "6 Clicks" Mobility  Outcome Measure Help needed turning from your back to your side while in a flat bed without using bedrails?: A Lot Help needed moving from lying on your back to sitting on the side of a  flat bed without using bedrails?: A Lot Help needed moving to and from a bed to a chair (including a wheelchair)?: A Lot Help needed standing up from a chair using your arms (e.g., wheelchair or bedside chair)?: A Little Help needed to walk in hospital room?: A Lot Help needed climbing 3-5 steps with a railing? : A Lot 6 Click Score: 13    End of Session   Activity Tolerance: Patient tolerated treatment well Patient left: in chair;with call bell/phone within reach;with family/visitor present Nurse Communication: Mobility status PT Visit Diagnosis: Other abnormalities of gait and mobility (R26.89);Muscle weakness (generalized) (M62.81);Unsteadiness on feet (R26.81);Difficulty in walking, not elsewhere classified (R26.2);History of falling (Z91.81)    Time: 7425-9563 PT Time Calculation (min) (ACUTE ONLY): 22 min   Charges:   PT Evaluation $PT Eval Moderate Complexity: 1 Mod   PT General Charges $$ ACUTE PT VISIT: 1 Visit         Moris Ratchford, PT, GCS 01/25/23,3:08 PM

## 2023-01-25 NOTE — ED Notes (Signed)
Sat up in bed to eat breakfast. Eating with no sign of distress.

## 2023-01-25 NOTE — Care Management CC44 (Signed)
Condition Code 44 Documentation Completed  Patient Details  Name: Victoria Gutierrez MRN: [id number] Date of Birth: 1945/08/28   Condition Code 44 given:  Yes Patient signature on Condition Code 44 notice:  Yes Documentation of 2 MD's agreement:  Yes Code 44 added to claim:  Yes    Lavenia Atlas, RN 01/25/2023, 5:19 PM

## 2023-01-25 NOTE — Evaluation (Signed)
Occupational Therapy Evaluation Patient Details Name: Victoria Gutierrez MRN: [id number] DOB: 1945/09/28 Today's Date: 01/25/2023   History of Present Illness Victoria Gutierrez is a 77 y.o. Caucasian female with medical history significant for osteoarthritis, hypertension, and CVA who presented to the emergency room with acute onset of difficulty with ambulation with left greater than right weakness over the last few days .Brain MRI without contrast revealed the following:  1. Multiple small foci of acute ischemia within the right basal  ganglia and posterior limb of the right internal capsule. No acute  hemorrhage or mass effect.  2. 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  3. Multiple chronic microhemorrhages in a predominantly central  distribution, consistent with chronic hypertensive angiopathy.   Clinical Impression   Victoria Gutierrez was seen for OT evaluation this date. Prior to hospital admission, pt was residing at home with her daughter with a caregiver assisting during the day. Pt required some assist with ADL management at baseline, but was ambulatory with a 4WW. Pt endorses multiple falls in the last 6 months. Pt presents to acute OT demonstrating impaired ADL performance and functional mobility 2/2 decreased functional strength in her LLE/LUE, decreased balance, and limited activity tolerance (See OT problem list for additional functional deficits). Pt currently requires MIN A +2 for safety during STS and step pivot transfer to recliner.  Pt would benefit from skilled OT services to address noted impairments and functional limitations (see below for any additional details) in order to maximize safety and independence while minimizing falls risk and caregiver burden. Anticipate the need for follow up OT services upon acute hospital DC.       Recommendations for follow up therapy are one component of a multi-disciplinary discharge planning process, led by the attending physician.   Recommendations may be updated based on patient status, additional functional criteria and insurance authorization.   Assistance Recommended at Discharge Frequent or constant Supervision/Assistance  Patient can return home with the following A little help with walking and/or transfers;A lot of help with bathing/dressing/bathroom;Assist for transportation;Help with stairs or ramp for entrance;Assistance with cooking/housework    Functional Status Assessment  Patient has had a recent decline in their functional status and demonstrates the ability to make significant improvements in function in a reasonable and predictable amount of time.  Equipment Recommendations  None recommended by OT (Pt has necessary equipment)    Recommendations for Other Services       Precautions / Restrictions Precautions Precautions: Fall Restrictions Weight Bearing Restrictions: No      Mobility Bed Mobility Overal bed mobility: Needs Assistance Bed Mobility: Supine to Sit     Supine to sit: Mod assist, HOB elevated          Transfers Overall transfer level: Needs assistance Equipment used: 2 person hand held assist Transfers: Sit to/from Stand, Bed to chair/wheelchair/BSC Sit to Stand: Min assist, +2 physical assistance     Step pivot transfers: Min assist, +2 physical assistance            Balance Overall balance assessment: Needs assistance Sitting-balance support: Feet supported Sitting balance-Leahy Scale: Good     Standing balance support: Bilateral upper extremity supported, During functional activity, Reliant on assistive device for balance Standing balance-Leahy Scale: Fair Standing balance comment: requires B UE support with standing                           ADL either performed or assessed  with clinical judgement   ADL Overall ADL's : Needs assistance/impaired                                     Functional mobility during ADLs: Rolling  walker (2 wheels);Minimal assistance;+2 for physical assistance;+2 for safety/equipment;Cueing for safety General ADL Comments: Pt is functionally limited by increased LLE weakness, decreased balance, and decreased activity tolerance.     Vision Patient Visual Report: No change from baseline       Perception     Praxis      Pertinent Vitals/Pain Pain Assessment Pain Assessment: No/denies pain     Hand Dominance Right   Extremity/Trunk Assessment Upper Extremity Assessment Upper Extremity Assessment: Generalized weakness;LUE deficits/detail LUE Deficits / Details: baseline weakness, decreased AROM of L shoulder.   Lower Extremity Assessment Lower Extremity Assessment: Generalized weakness   Cervical / Trunk Assessment Cervical / Trunk Assessment: Normal   Communication Communication Communication: HOH   Cognition Arousal/Alertness: Awake/alert Behavior During Therapy: WFL for tasks assessed/performed                                   General Comments: A & O to self, place, situation.     General Comments       Exercises Other Exercises Other Exercises: Pt educated on role of OT in acute setitng, safety, falls prevention, and DC recs.   Shoulder Instructions      Home Living Family/patient expects to be discharged to:: Private residence Living Arrangements: Children;Non-relatives/Friends Available Help at Discharge: Family;Available 24 hours/day;Personal care attendant Type of Home: House Home Access: Stairs to enter [employer] of Steps: 5 Entrance Stairs-Rails: Right;Left;Can reach both Home Layout: One level     Bathroom Shower/Tub: Tub only   Firefighter: Standard     Home Equipment: Rollator (4 wheels);Rolling Walker (2 wheels);Shower seat;Other (comment);BSC/3in1;Hand held shower head   Additional Comments: Hired caregiver during the day; daughter home at night      Prior Functioning/Environment Prior Level of  Function : Needs assist;History of Falls (last six months)             Mobility Comments: Pt reports she is able to ambulate household distances using RW. Reports multiple falls in las 6 months including 1 earlier this week. ADLs Comments: Assist from daughter for ADLs (bathing, LB dressing) and IADLs (household chores, transportation, meals, med mgmt). IND for self-feeding. Wears depends at baseline.        OT Problem List: Decreased strength;Decreased coordination;Decreased activity tolerance;Decreased safety awareness;Impaired balance (sitting and/or standing);Decreased knowledge of use of DME or AE;Decreased range of motion;Impaired UE functional use      OT Treatment/Interventions: Self-care/ADL training;Therapeutic exercise;Therapeutic activities;DME and/or AE instruction;Patient/family education;Balance training;Energy conservation    OT Goals(Current goals can be found in the care plan section) Acute Rehab OT Goals Patient Stated Goal: To go home OT Goal Formulation: With patient/family Time For Goal Achievement: 02/08/23 Potential to Achieve Goals: Good ADL Goals Pt Will Perform Grooming: standing;sitting;with modified independence Pt Will Perform Lower Body Dressing: sit to/from stand;with supervision;with set-up;with adaptive equipment;with caregiver independent in assisting Pt Will Transfer to Toilet: ambulating;bedside commode;with set-up;with supervision Pt Will Perform Toileting - Clothing Manipulation and hygiene: sit to/from stand;with min guard assist;with adaptive equipment;with caregiver independent in assisting  OT Frequency: Min 1X/week    Co-evaluation   Reason  for Co-Treatment: For patient/therapist safety;To address functional/ADL transfers PT goals addressed during session: Mobility/safety with mobility;Balance        AM-PAC OT "6 Clicks" Daily Activity     Outcome Measure Help from another person eating meals?: A Little Help from another person  taking care of personal grooming?: A Little Help from another person toileting, which includes using toliet, bedpan, or urinal?: A Little Help from another person bathing (including washing, rinsing, drying)?: A Little Help from another person to put on and taking off regular upper body clothing?: A Little Help from another person to put on and taking off regular lower body clothing?: A Lot 6 Click Score: 17   End of Session Nurse Communication: Mobility status  Activity Tolerance: Patient tolerated treatment well Patient left: in chair;with family/visitor present  OT Visit Diagnosis: Other abnormalities of gait and mobility (R26.89);Hemiplegia and hemiparesis Hemiplegia - Right/Left: Left Hemiplegia - dominant/non-dominant: Non-Dominant Hemiplegia - caused by: Cerebral infarction                Time: 9563-8756 OT Time Calculation (min): 23 min Charges:  OT General Charges $OT Visit: 1 Visit OT Evaluation $OT Eval Moderate Complexity: 1 Mod  Rockney Ghee, M.S., OTR/L 01/25/23, 3:16 PM

## 2023-01-25 NOTE — ED Notes (Signed)
Speech Therapist present. Shared that passing the swallowing screen gives the ok to give medications. Patient sat up in bed,. Speech therapist gave patient water and coffee.

## 2023-01-25 NOTE — Discharge Summary (Signed)
Physician Discharge Summary   Patient: Victoria Gutierrez MRN: [id number]  DOB: 10-15-1945   Admit:     Date of Admission: 01/24/2023 Admitted from: home   Discharge: Date of discharge: 01/25/23 Disposition: Home w/ home health  Condition at discharge: good  CODE STATUS: FULL CODE      Discharge Physician: Sunnie Nielsen, DO [facility]     PCP: Jerl Mina, MD  Recommendations for Outpatient Follow-up:  Follow up with PCP Jerl Mina, MD in 1-2 weeks Follow up referral for neurology placed Please obtain labs/tests: BMP, CBC in 1-2 weeks. Will need lipids and hepatic profile in 6 weeks on higher dose statin  Please follow up on the following pending results: none  Discharge Instructions     Ambulatory referral to Neurology   Complete by: As directed    Diet - low sodium heart healthy   Complete by: As directed    Increase activity slowly   Complete by: As directed          Discharge Diagnoses: Principal Problem:   Acute/subacute CVA Active Problems:   Hypertensive urgency   Hypokalemia   Dyslipidemia   Stage 3b chronic kidney disease (CKD) (HCC)   CVA (cerebral vascular accident) [facility])       Hospital Course: Victoria Gutierrez is a 77 y.o. Caucasian female with medical history significant for osteoarthritis, hypertension, and CVA who presented to the emergency room with acute onset of difficulty with ambulation with left greater than right weakness over the last few days.  She was at a birthday party over the weekend on Friday night 07/26 when she had trouble walking without significant assistance. Pt and family assumed it was exhaustion from the events of the day but she wasn't improving and was brought to the ED 07/29.   Brain MRI without contrast revealed the following: 1. Multiple small foci of acute ischemia within the right basal ganglia and posterior limb of the right internal capsule. No acute hemorrhage or mass effect. 2. Evolving  late subacute infarct of the right caudate head. 3. Multiple chronic microhemorrhages in a predominantly central distribution, consistent with chronic hypertensive angiopathy.   Head and neck CTA revealed the following: 1. Unchanged left M1 occlusion with diminutive distal branches. 2. 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 3. Bilateral carotid bifurcation atherosclerosis without hemodynamically significant stenosis. 4.  Aortic Atherosclerosis  Neurology evaluated, ok to conintue ASA, Plavix, Statin and follow outpatient. DIscussed w/ patient and family, no concerns for discharge home t     Consultants:  Neurology   Procedures: none      ASSESSMENT & PLAN:   Acute/subacute CVA ASA, Plavix, high potency statin (daughter thinks pt was on 10 mg lipirot at home, increased this to 40) Neurology f/u outpatient PT/OT outpatient w/ home health   Hypokalemia replaced Follow outpatient   Dyslipidemia Statin as above  Hypertensive urgency Allowed permissive hypertension for now Resume meds at home  Stage 3b chronic kidney disease (CKD) (HCC) Creatinine is better than previous levels. BMP outpatient            Discharge Instructions  Allergies as of 01/25/2023   No Known Allergies      Medication List     TAKE these medications    acetaminophen 325 MG tablet Commonly known as: Tylenol Take 2 tablets (650 mg total) by mouth every 6 (six) hours as needed for moderate pain, mild pain, headache or fever.  amLODipine 5 MG tablet Commonly known as: NORVASC Take 2 tablets (10 mg total) by mouth at bedtime as needed (Systolic BP greater than 150 mmHg).   aspirin EC 81 MG tablet Take 81 mg by mouth daily. Swallow whole.   atorvastatin 40 MG tablet Commonly known as: LIPITOR Take 1 tablet (40 mg total) by mouth daily. What changed:  medication strength how much to take when to take this Another medication with the  same name was removed. Continue taking this medication, and follow the directions you see here.   clobetasol 0.05 % external solution Commonly known as: TEMOVATE Continue to apply twice daily to the scalp for itching   clopidogrel 75 MG tablet Commonly known as: PLAVIX Take 1 tablet (75 mg total) by mouth daily.   cyanocobalamin 1000 MCG tablet Commonly known as: VITAMIN B12 Take 1 tablet (1,000 mcg total) by mouth daily.   lisinopril 20 MG tablet Commonly known as: ZESTRIL Take 1 tablet (20 mg total) by mouth daily. Skip the dose if systolic BP less than 130 mmHg   meclizine 12.5 MG tablet Commonly known as: ANTIVERT Take 12.5 mg by mouth 3 (three) times daily as needed for dizziness.   mirtazapine 15 MG tablet Commonly known as: REMERON Take 15 mg by mouth at bedtime.          No Known Allergies   Subjective: Pt reports feling tired, L side still weak, no other concerns,    Discharge Exam: BP 128/66   Pulse 70   Temp 97.9 F (36.6 C) (Oral)   Resp (!) 23   Ht 5\' 7"  (1.702 m)   Wt 72.8 kg   SpO2 97%   BMI 25.14 kg/m  General: Pt is alert, awake, not in acute distress Cardiovascular: RRR, S1/S2 +, no rubs, no gallops Respiratory: CTA bilaterally, no wheezing, no rhonchi Abdominal: Soft, NT, ND, bowel sounds + Extremities: no edema, no cyanosis Neuro: reduced strength L arm and L leg     The results of significant diagnostics from this hospitalization (including imaging, microbiology, ancillary and laboratory) are listed below for reference.     Microbiology: Recent Results (from the past 240 hour(s))  SARS Coronavirus 2 by RT PCR (hospital order, performed in [facility] hospital lab) *cepheid single result test* Anterior Nasal Swab     Status: None   Collection Time: 01/24/23  5:09 PM   Specimen: Anterior Nasal Swab  Result Value Ref Range Status   SARS Coronavirus 2 by RT PCR NEGATIVE NEGATIVE Final    Comment: (NOTE) SARS-CoV-2 target nucleic  acids are NOT DETECTED.  The SARS-CoV-2 RNA is generally detectable in upper and lower respiratory specimens during the acute phase of infection. The lowest concentration of SARS-CoV-2 viral copies this assay can detect is 250 copies / mL. A negative result does not preclude SARS-CoV-2 infection and should not be used as the sole basis for treatment or other patient management decisions.  A negative result may occur with improper specimen collection / handling, submission of specimen other than nasopharyngeal swab, presence of viral mutation(s) within the areas targeted by this assay, and inadequate number of viral copies (<250 copies / mL). A negative result must be combined with clinical observations, patient history, and epidemiological information.  Fact Sheet for Patients:   RoadLapTop.co.za  Fact Sheet for Healthcare Providers: http://kim-miller.com/  This test is not yet approved or  cleared by the [place] FDA and has been authorized for detection and/or diagnosis of SARS-CoV-2 by FDA  under an Emergency Use Authorization (EUA).  This EUA will remain in effect (meaning this test can be used) for the duration of the COVID-19 declaration under Section 564(b)(1) of the Act, 21 U.S.C. section 360bbb-3(b)(1), unless the authorization is terminated or revoked sooner.  Performed at [facility], [address] [place], [place] [address]      Labs: BNP (last 3 results) Recent Labs    10/04/22 0937  BNP 96.6   Basic Metabolic Panel: Recent Labs  Lab 01/24/23 1424 01/25/23 0424  NA 138 140  K 3.3* 3.0*  CL 106 106  CO2 21* 25  GLUCOSE 153* 102*  BUN 21 24*  CREATININE 1.39* 1.17*  CALCIUM 9.1 8.7*   Liver Function Tests: No results for input(s): "AST", "ALT", "ALKPHOS", "BILITOT", "PROT", "ALBUMIN" in the last 168 hours. No results for input(s): "LIPASE", "AMYLASE" in the last 168 hours. No results for  input(s): "AMMONIA" in the last 168 hours. CBC: Recent Labs  Lab 01/24/23 1424 01/25/23 0424  WBC 7.9 7.6  HGB 14.6 13.1  HCT 43.8 38.6  MCV 95.4 94.6  PLT 288 246   Cardiac Enzymes: No results for input(s): "CKTOTAL", "CKMB", "CKMBINDEX", "TROPONINI" in the last 168 hours. BNP: Invalid input(s): "POCBNP" CBG: No results for input(s): "GLUCAP" in the last 168 hours. D-Dimer No results for input(s): "DDIMER" in the last 72 hours. Hgb A1c No results for input(s): "HGBA1C" in the last 72 hours. Lipid Profile Recent Labs    01/25/23 0424  CHOL 195  HDL 36*  LDLCALC 127*  TRIG 162*  CHOLHDL 5.4   Thyroid function studies No results for input(s): "TSH", "T4TOTAL", "T3FREE", "THYROIDAB" in the last 72 hours.  Invalid input(s): "FREET3" Anemia work up No results for input(s): "VITAMINB12", "FOLATE", "FERRITIN", "TIBC", "IRON", "RETICCTPCT" in the last 72 hours. Urinalysis    Component Value Date/Time   COLORURINE YELLOW (A) 01/24/2023 2355   APPEARANCEUR CLEAR (A) 01/24/2023 2355   APPEARANCEUR Cloudy (A) 03/07/2019 1132   LABSPEC 1.038 (H) 01/24/2023 2355   LABSPEC 1.011 08/11/2011 2253   PHURINE 7.0 01/24/2023 2355   GLUCOSEU NEGATIVE 01/24/2023 2355   GLUCOSEU Negative 08/11/2011 2253   HGBUR NEGATIVE 01/24/2023 2355   BILIRUBINUR NEGATIVE 01/24/2023 2355   BILIRUBINUR Negative 03/07/2019 1132   BILIRUBINUR Negative 08/11/2011 2253   KETONESUR NEGATIVE 01/24/2023 2355   PROTEINUR NEGATIVE 01/24/2023 2355   NITRITE NEGATIVE 01/24/2023 2355   LEUKOCYTESUR NEGATIVE 01/24/2023 2355   LEUKOCYTESUR Negative 08/11/2011 2253   Sepsis Labs Recent Labs  Lab 01/24/23 1424 01/25/23 0424  WBC 7.9 7.6   Microbiology Recent Results (from the past 240 hour(s))  SARS Coronavirus 2 by RT PCR (hospital order, performed in [facility] hospital lab) *cepheid single result test* Anterior Nasal Swab     Status: None   Collection Time: 01/24/23  5:09 PM   Specimen: Anterior  Nasal Swab  Result Value Ref Range Status   SARS Coronavirus 2 by RT PCR NEGATIVE NEGATIVE Final    Comment: (NOTE) SARS-CoV-2 target nucleic acids are NOT DETECTED.  The SARS-CoV-2 RNA is generally detectable in upper and lower respiratory specimens during the acute phase of infection. The lowest concentration of SARS-CoV-2 viral copies this assay can detect is 250 copies / mL. A negative result does not preclude SARS-CoV-2 infection and should not be used as the sole basis for treatment or other patient management decisions.  A negative result may occur with improper specimen collection / handling, submission of specimen other than nasopharyngeal  swab, presence of viral mutation(s) within the areas targeted by this assay, and inadequate number of viral copies (<250 copies / mL). A negative result must be combined with clinical observations, patient history, and epidemiological information.  Fact Sheet for Patients:   RoadLapTop.co.za  Fact Sheet for Healthcare Providers: http://kim-miller.com/  This test is not yet approved or  cleared by the [place] FDA and has been authorized for detection and/or diagnosis of SARS-CoV-2 by FDA under an Emergency Use Authorization (EUA).  This EUA will remain in effect (meaning this test can be used) for the duration of the COVID-19 declaration under Section 564(b)(1) of the Act, 21 U.S.C. section 360bbb-3(b)(1), unless the authorization is terminated or revoked sooner.  Performed at [facility], [address] [place], [place] [address]    Imaging ECHOCARDIOGRAM COMPLETE BUBBLE STUDY  Result Date: 01/25/2023    ECHOCARDIOGRAM REPORT   Patient Name:   Victoria Gutierrez Date of Exam: 01/25/2023 Medical Rec #:  [id number]      Height:       67.0 in Accession #:    [id number]     Weight:       160.5 lb Date of Birth:  13-Dec-1945      BSA:          1.842 m Patient Age:    77 years        BP:           163/69 mmHg Patient Gender: F              HR:           65 bpm. Exam Location:  [facility] Procedure: 2D Echo, Cardiac Doppler, Color Doppler and Saline Contrast Bubble            Study Indications:     Stroke  History:         Patient has prior history of Echocardiogram examinations, most                  recent 02/27/2022. CHF, Stroke; Risk Factors:Hypertension,                  Dyslipidemia and Current Smoker. CKD.  Sonographer:     Mikki Harbor Referring Phys:  [id number] JAN A MANSY Diagnosing Phys: Lorine Bears MD IMPRESSIONS  1. Left ventricular ejection fraction, by estimation, is 70 to 75%. The left ventricle has hyperdynamic function. The left ventricle has no regional wall motion abnormalities. There is severe left ventricular hypertrophy. Left ventricular diastolic parameters were normal.  2. Right ventricular systolic function is normal. The right ventricular size is normal. Tricuspid regurgitation signal is inadequate for assessing PA pressure.  3. The mitral valve is normal in structure. No evidence of mitral valve regurgitation. No evidence of mitral stenosis.  4. The aortic valve is normal in structure. Aortic valve regurgitation is not visualized. Aortic valve sclerosis is present, with no evidence of aortic valve stenosis.  5. Agitated saline contrast bubble study was negative, with no evidence of any interatrial shunt. FINDINGS  Left Ventricle: Left ventricular ejection fraction, by estimation, is 70 to 75%. The left ventricle has hyperdynamic function. The left ventricle has no regional wall motion abnormalities. The left ventricular internal cavity size was normal in size. There is severe left ventricular hypertrophy. Left ventricular diastolic parameters were normal. Right Ventricle: The right ventricular size is normal. No increase in right ventricular wall thickness. Right ventricular systolic function is normal. Tricuspid regurgitation signal is inadequate  for assessing PA  pressure. Left Atrium: Left atrial size was normal in size. Right Atrium: Right atrial size was normal in size. Pericardium: There is no evidence of pericardial effusion. Mitral Valve: The mitral valve is normal in structure. There is mild thickening of the mitral valve leaflet(s). Mild mitral annular calcification. No evidence of mitral valve regurgitation. No evidence of mitral valve stenosis. MV peak gradient, 3.6 mmHg. The mean mitral valve gradient is 1.0 mmHg. Tricuspid Valve: The tricuspid valve is normal in structure. Tricuspid valve regurgitation is not demonstrated. No evidence of tricuspid stenosis. Aortic Valve: The aortic valve is normal in structure. Aortic valve regurgitation is not visualized. Aortic valve sclerosis is present, with no evidence of aortic valve stenosis. Aortic valve mean gradient measures 4.0 mmHg. Aortic valve peak gradient measures 9.1 mmHg. Aortic valve area, by VTI measures 2.73 cm. Pulmonic Valve: The pulmonic valve was normal in structure. Pulmonic valve regurgitation is trivial. No evidence of pulmonic stenosis. Aorta: The aortic root is normal in size and structure. Venous: The inferior vena cava was not well visualized. IAS/Shunts: No atrial level shunt detected by color flow Doppler. Agitated saline contrast was given intravenously to evaluate for intracardiac shunting. Agitated saline contrast bubble study was negative, with no evidence of any interatrial shunt.  LEFT VENTRICLE PLAX 2D LVIDd:         4.60 cm   Diastology LVIDs:         2.30 cm   LV e' medial:    6.42 cm/s LV PW:         1.40 cm   LV E/e' medial:  13.2 LV IVS:        1.40 cm   LV e' lateral:   8.38 cm/s LVOT diam:     2.00 cm   LV E/e' lateral: 10.1 LV SV:         79 LV SV Index:   43 LVOT Area:     3.14 cm  RIGHT VENTRICLE RV Basal diam:  3.40 cm RV Mid diam:    3.00 cm RV S prime:     10.30 cm/s TAPSE (M-mode): 2.4 cm LEFT ATRIUM           Index        RIGHT ATRIUM           Index LA diam:      3.20 cm  1.74 cm/m   RA Area:     18.30 cm LA Vol (A2C): 40.8 ml 22.15 ml/m  RA Volume:   46.40 ml  25.19 ml/m LA Vol (A4C): 45.9 ml 24.92 ml/m  AORTIC VALVE                    PULMONIC VALVE AV Area (Vmax):    2.52 cm     PV Vmax:       0.86 m/s AV Area (Vmean):   2.52 cm     PV Peak grad:  3.0 mmHg AV Area (VTI):     2.73 cm AV Vmax:           151.00 cm/s AV Vmean:          97.800 cm/s AV VTI:            0.291 m AV Peak Grad:      9.1 mmHg AV Mean Grad:      4.0 mmHg LVOT Vmax:         121.00 cm/s LVOT Vmean:  78.300 cm/s LVOT VTI:          0.253 m LVOT/AV VTI ratio: 0.87  AORTA Ao Root diam: 3.50 cm MITRAL VALVE MV Area (PHT): 3.31 cm    SHUNTS MV Area VTI:   2.71 cm    Systemic VTI:  0.25 m MV Peak grad:  3.6 mmHg    Systemic Diam: 2.00 cm MV Mean grad:  1.0 mmHg MV Vmax:       0.95 m/s MV Vmean:      55.1 cm/s MV Decel Time: 229 msec MV E velocity: 84.90 cm/s MV A velocity: 76.00 cm/s MV E/A ratio:  1.12 Lorine Bears MD Electronically signed by Lorine Bears MD Signature Date/Time: 01/25/2023/12:25:30 PM    Final       Time coordinating discharge: over 30 minutes  SIGNED:  Sunnie Nielsen DO [facility]

## 2023-01-27 ENCOUNTER — Ambulatory Visit
Admit: 2023-01-27 | Discharge: 2023-01-28 | Payer: MEDICARE | Attending: Student in an Organized Health Care Education/Training Program | Primary: Student in an Organized Health Care Education/Training Program

## 2023-01-27 DIAGNOSIS — R21 Rash and other nonspecific skin eruption: Principal | ICD-10-CM

## 2023-01-27 DIAGNOSIS — Z79899 Other long term (current) drug therapy: Principal | ICD-10-CM

## 2023-01-27 DIAGNOSIS — C44719 Basal cell carcinoma of skin of left lower limb, including hip: Secondary | ICD-10-CM | POA: Diagnosis not present

## 2023-01-27 DIAGNOSIS — D492 Neoplasm of unspecified behavior of bone, soft tissue, and skin: Secondary | ICD-10-CM | POA: Diagnosis not present

## 2023-01-27 DIAGNOSIS — L409 Psoriasis, unspecified: Secondary | ICD-10-CM | POA: Diagnosis not present

## 2023-01-27 DIAGNOSIS — B079 Viral wart, unspecified: Secondary | ICD-10-CM | POA: Diagnosis not present

## 2023-01-27 MED ORDER — TRIAMCINOLONE ACETONIDE 0.1 % TOPICAL OINTMENT
INTRAMUSCULAR | 0 refills | 0.00000 days | Status: CP
Start: 2023-01-27 — End: ?

## 2023-01-27 MED ORDER — CLOBETASOL 0.05 % SCALP SOLUTION
OPHTHALMIC | 5 refills | 0.00000 days | Status: CP
Start: 2023-01-27 — End: ?

## 2023-02-09 DIAGNOSIS — I679 Cerebrovascular disease, unspecified: Secondary | ICD-10-CM | POA: Diagnosis not present

## 2023-02-09 DIAGNOSIS — M069 Rheumatoid arthritis, unspecified: Secondary | ICD-10-CM | POA: Diagnosis not present

## 2023-02-09 DIAGNOSIS — I739 Peripheral vascular disease, unspecified: Secondary | ICD-10-CM | POA: Diagnosis not present

## 2023-02-09 DIAGNOSIS — E785 Hyperlipidemia, unspecified: Secondary | ICD-10-CM | POA: Diagnosis not present

## 2023-02-09 DIAGNOSIS — I10 Essential (primary) hypertension: Secondary | ICD-10-CM | POA: Diagnosis not present

## 2023-02-09 DIAGNOSIS — I69954 Hemiplegia and hemiparesis following unspecified cerebrovascular disease affecting left non-dominant side: Secondary | ICD-10-CM | POA: Diagnosis not present

## 2023-02-11 DIAGNOSIS — M545 Low back pain, unspecified: Secondary | ICD-10-CM | POA: Diagnosis not present

## 2023-02-11 DIAGNOSIS — E0520 Thyrotoxicosis with toxic multinodular goiter without thyrotoxic crisis or storm: Secondary | ICD-10-CM | POA: Diagnosis not present

## 2023-02-11 DIAGNOSIS — N1832 Chronic kidney disease, stage 3b: Secondary | ICD-10-CM | POA: Diagnosis not present

## 2023-02-11 DIAGNOSIS — I739 Peripheral vascular disease, unspecified: Secondary | ICD-10-CM | POA: Diagnosis not present

## 2023-02-11 DIAGNOSIS — M1990 Unspecified osteoarthritis, unspecified site: Secondary | ICD-10-CM | POA: Diagnosis not present

## 2023-02-11 DIAGNOSIS — E876 Hypokalemia: Secondary | ICD-10-CM | POA: Diagnosis not present

## 2023-02-11 DIAGNOSIS — I700 Atherosclerosis of aorta: Secondary | ICD-10-CM | POA: Diagnosis not present

## 2023-02-11 DIAGNOSIS — I129 Hypertensive chronic kidney disease with stage 1 through stage 4 chronic kidney disease, or unspecified chronic kidney disease: Secondary | ICD-10-CM | POA: Diagnosis not present

## 2023-02-11 DIAGNOSIS — I69354 Hemiplegia and hemiparesis following cerebral infarction affecting left non-dominant side: Secondary | ICD-10-CM | POA: Diagnosis not present

## 2023-02-16 DIAGNOSIS — I129 Hypertensive chronic kidney disease with stage 1 through stage 4 chronic kidney disease, or unspecified chronic kidney disease: Secondary | ICD-10-CM | POA: Diagnosis not present

## 2023-02-16 DIAGNOSIS — M545 Low back pain, unspecified: Secondary | ICD-10-CM | POA: Diagnosis not present

## 2023-02-16 DIAGNOSIS — I700 Atherosclerosis of aorta: Secondary | ICD-10-CM | POA: Diagnosis not present

## 2023-02-16 DIAGNOSIS — E0520 Thyrotoxicosis with toxic multinodular goiter without thyrotoxic crisis or storm: Secondary | ICD-10-CM | POA: Diagnosis not present

## 2023-02-16 DIAGNOSIS — E876 Hypokalemia: Secondary | ICD-10-CM | POA: Diagnosis not present

## 2023-02-16 DIAGNOSIS — M1990 Unspecified osteoarthritis, unspecified site: Secondary | ICD-10-CM | POA: Diagnosis not present

## 2023-02-16 DIAGNOSIS — I739 Peripheral vascular disease, unspecified: Secondary | ICD-10-CM | POA: Diagnosis not present

## 2023-02-16 DIAGNOSIS — N1832 Chronic kidney disease, stage 3b: Secondary | ICD-10-CM | POA: Diagnosis not present

## 2023-02-16 DIAGNOSIS — I69354 Hemiplegia and hemiparesis following cerebral infarction affecting left non-dominant side: Secondary | ICD-10-CM | POA: Diagnosis not present

## 2023-02-17 DIAGNOSIS — M545 Low back pain, unspecified: Secondary | ICD-10-CM | POA: Diagnosis not present

## 2023-02-17 DIAGNOSIS — I129 Hypertensive chronic kidney disease with stage 1 through stage 4 chronic kidney disease, or unspecified chronic kidney disease: Secondary | ICD-10-CM | POA: Diagnosis not present

## 2023-02-17 DIAGNOSIS — N1832 Chronic kidney disease, stage 3b: Secondary | ICD-10-CM | POA: Diagnosis not present

## 2023-02-17 DIAGNOSIS — I739 Peripheral vascular disease, unspecified: Secondary | ICD-10-CM | POA: Diagnosis not present

## 2023-02-17 DIAGNOSIS — E0520 Thyrotoxicosis with toxic multinodular goiter without thyrotoxic crisis or storm: Secondary | ICD-10-CM | POA: Diagnosis not present

## 2023-02-17 DIAGNOSIS — M1990 Unspecified osteoarthritis, unspecified site: Secondary | ICD-10-CM | POA: Diagnosis not present

## 2023-02-17 DIAGNOSIS — I700 Atherosclerosis of aorta: Secondary | ICD-10-CM | POA: Diagnosis not present

## 2023-02-17 DIAGNOSIS — I69354 Hemiplegia and hemiparesis following cerebral infarction affecting left non-dominant side: Secondary | ICD-10-CM | POA: Diagnosis not present

## 2023-02-17 DIAGNOSIS — E876 Hypokalemia: Secondary | ICD-10-CM | POA: Diagnosis not present

## 2023-02-21 DIAGNOSIS — I700 Atherosclerosis of aorta: Secondary | ICD-10-CM | POA: Diagnosis not present

## 2023-02-21 DIAGNOSIS — N1832 Chronic kidney disease, stage 3b: Secondary | ICD-10-CM | POA: Diagnosis not present

## 2023-02-21 DIAGNOSIS — I69354 Hemiplegia and hemiparesis following cerebral infarction affecting left non-dominant side: Secondary | ICD-10-CM | POA: Diagnosis not present

## 2023-02-21 DIAGNOSIS — M545 Low back pain, unspecified: Secondary | ICD-10-CM | POA: Diagnosis not present

## 2023-02-21 DIAGNOSIS — E0520 Thyrotoxicosis with toxic multinodular goiter without thyrotoxic crisis or storm: Secondary | ICD-10-CM | POA: Diagnosis not present

## 2023-02-21 DIAGNOSIS — I739 Peripheral vascular disease, unspecified: Secondary | ICD-10-CM | POA: Diagnosis not present

## 2023-02-21 DIAGNOSIS — M1990 Unspecified osteoarthritis, unspecified site: Secondary | ICD-10-CM | POA: Diagnosis not present

## 2023-02-21 DIAGNOSIS — I129 Hypertensive chronic kidney disease with stage 1 through stage 4 chronic kidney disease, or unspecified chronic kidney disease: Secondary | ICD-10-CM | POA: Diagnosis not present

## 2023-02-21 DIAGNOSIS — E876 Hypokalemia: Secondary | ICD-10-CM | POA: Diagnosis not present

## 2023-02-23 DIAGNOSIS — I69354 Hemiplegia and hemiparesis following cerebral infarction affecting left non-dominant side: Secondary | ICD-10-CM | POA: Diagnosis not present

## 2023-02-23 DIAGNOSIS — M1990 Unspecified osteoarthritis, unspecified site: Secondary | ICD-10-CM | POA: Diagnosis not present

## 2023-02-23 DIAGNOSIS — I700 Atherosclerosis of aorta: Secondary | ICD-10-CM | POA: Diagnosis not present

## 2023-02-23 DIAGNOSIS — E0520 Thyrotoxicosis with toxic multinodular goiter without thyrotoxic crisis or storm: Secondary | ICD-10-CM | POA: Diagnosis not present

## 2023-02-23 DIAGNOSIS — N1832 Chronic kidney disease, stage 3b: Secondary | ICD-10-CM | POA: Diagnosis not present

## 2023-02-23 DIAGNOSIS — M545 Low back pain, unspecified: Secondary | ICD-10-CM | POA: Diagnosis not present

## 2023-02-23 DIAGNOSIS — E876 Hypokalemia: Secondary | ICD-10-CM | POA: Diagnosis not present

## 2023-02-23 DIAGNOSIS — I739 Peripheral vascular disease, unspecified: Secondary | ICD-10-CM | POA: Diagnosis not present

## 2023-02-23 DIAGNOSIS — I129 Hypertensive chronic kidney disease with stage 1 through stage 4 chronic kidney disease, or unspecified chronic kidney disease: Secondary | ICD-10-CM | POA: Diagnosis not present

## 2023-03-01 DIAGNOSIS — M545 Low back pain, unspecified: Secondary | ICD-10-CM | POA: Diagnosis not present

## 2023-03-01 DIAGNOSIS — I69354 Hemiplegia and hemiparesis following cerebral infarction affecting left non-dominant side: Secondary | ICD-10-CM | POA: Diagnosis not present

## 2023-03-01 DIAGNOSIS — N1832 Chronic kidney disease, stage 3b: Secondary | ICD-10-CM | POA: Diagnosis not present

## 2023-03-01 DIAGNOSIS — M1990 Unspecified osteoarthritis, unspecified site: Secondary | ICD-10-CM | POA: Diagnosis not present

## 2023-03-01 DIAGNOSIS — E876 Hypokalemia: Secondary | ICD-10-CM | POA: Diagnosis not present

## 2023-03-01 DIAGNOSIS — I129 Hypertensive chronic kidney disease with stage 1 through stage 4 chronic kidney disease, or unspecified chronic kidney disease: Secondary | ICD-10-CM | POA: Diagnosis not present

## 2023-03-01 DIAGNOSIS — E0520 Thyrotoxicosis with toxic multinodular goiter without thyrotoxic crisis or storm: Secondary | ICD-10-CM | POA: Diagnosis not present

## 2023-03-01 DIAGNOSIS — I739 Peripheral vascular disease, unspecified: Secondary | ICD-10-CM | POA: Diagnosis not present

## 2023-03-01 DIAGNOSIS — I700 Atherosclerosis of aorta: Secondary | ICD-10-CM | POA: Diagnosis not present

## 2023-03-07 DIAGNOSIS — E0520 Thyrotoxicosis with toxic multinodular goiter without thyrotoxic crisis or storm: Secondary | ICD-10-CM | POA: Diagnosis not present

## 2023-03-07 DIAGNOSIS — N1832 Chronic kidney disease, stage 3b: Secondary | ICD-10-CM | POA: Diagnosis not present

## 2023-03-07 DIAGNOSIS — M1990 Unspecified osteoarthritis, unspecified site: Secondary | ICD-10-CM | POA: Diagnosis not present

## 2023-03-07 DIAGNOSIS — I129 Hypertensive chronic kidney disease with stage 1 through stage 4 chronic kidney disease, or unspecified chronic kidney disease: Secondary | ICD-10-CM | POA: Diagnosis not present

## 2023-03-07 DIAGNOSIS — I739 Peripheral vascular disease, unspecified: Secondary | ICD-10-CM | POA: Diagnosis not present

## 2023-03-07 DIAGNOSIS — I69354 Hemiplegia and hemiparesis following cerebral infarction affecting left non-dominant side: Secondary | ICD-10-CM | POA: Diagnosis not present

## 2023-03-07 DIAGNOSIS — M545 Low back pain, unspecified: Secondary | ICD-10-CM | POA: Diagnosis not present

## 2023-03-07 DIAGNOSIS — I700 Atherosclerosis of aorta: Secondary | ICD-10-CM | POA: Diagnosis not present

## 2023-03-07 DIAGNOSIS — E876 Hypokalemia: Secondary | ICD-10-CM | POA: Diagnosis not present

## 2023-03-08 DIAGNOSIS — E785 Hyperlipidemia, unspecified: Secondary | ICD-10-CM | POA: Diagnosis not present

## 2023-03-08 DIAGNOSIS — R300 Dysuria: Secondary | ICD-10-CM | POA: Diagnosis not present

## 2023-03-09 DIAGNOSIS — R300 Dysuria: Secondary | ICD-10-CM | POA: Diagnosis not present

## 2023-03-09 DIAGNOSIS — E785 Hyperlipidemia, unspecified: Secondary | ICD-10-CM | POA: Diagnosis not present

## 2023-03-10 DIAGNOSIS — I69354 Hemiplegia and hemiparesis following cerebral infarction affecting left non-dominant side: Secondary | ICD-10-CM | POA: Diagnosis not present

## 2023-03-10 DIAGNOSIS — N1832 Chronic kidney disease, stage 3b: Secondary | ICD-10-CM | POA: Diagnosis not present

## 2023-03-10 DIAGNOSIS — M545 Low back pain, unspecified: Secondary | ICD-10-CM | POA: Diagnosis not present

## 2023-03-10 DIAGNOSIS — E876 Hypokalemia: Secondary | ICD-10-CM | POA: Diagnosis not present

## 2023-03-10 DIAGNOSIS — I700 Atherosclerosis of aorta: Secondary | ICD-10-CM | POA: Diagnosis not present

## 2023-03-10 DIAGNOSIS — I739 Peripheral vascular disease, unspecified: Secondary | ICD-10-CM | POA: Diagnosis not present

## 2023-03-10 DIAGNOSIS — I129 Hypertensive chronic kidney disease with stage 1 through stage 4 chronic kidney disease, or unspecified chronic kidney disease: Secondary | ICD-10-CM | POA: Diagnosis not present

## 2023-03-10 DIAGNOSIS — E0520 Thyrotoxicosis with toxic multinodular goiter without thyrotoxic crisis or storm: Secondary | ICD-10-CM | POA: Diagnosis not present

## 2023-03-10 DIAGNOSIS — M1990 Unspecified osteoarthritis, unspecified site: Secondary | ICD-10-CM | POA: Diagnosis not present

## 2023-03-14 DIAGNOSIS — M1990 Unspecified osteoarthritis, unspecified site: Secondary | ICD-10-CM | POA: Diagnosis not present

## 2023-03-14 DIAGNOSIS — I700 Atherosclerosis of aorta: Secondary | ICD-10-CM | POA: Diagnosis not present

## 2023-03-14 DIAGNOSIS — E0520 Thyrotoxicosis with toxic multinodular goiter without thyrotoxic crisis or storm: Secondary | ICD-10-CM | POA: Diagnosis not present

## 2023-03-14 DIAGNOSIS — I739 Peripheral vascular disease, unspecified: Secondary | ICD-10-CM | POA: Diagnosis not present

## 2023-03-14 DIAGNOSIS — I129 Hypertensive chronic kidney disease with stage 1 through stage 4 chronic kidney disease, or unspecified chronic kidney disease: Secondary | ICD-10-CM | POA: Diagnosis not present

## 2023-03-14 DIAGNOSIS — N1832 Chronic kidney disease, stage 3b: Secondary | ICD-10-CM | POA: Diagnosis not present

## 2023-03-14 DIAGNOSIS — M545 Low back pain, unspecified: Secondary | ICD-10-CM | POA: Diagnosis not present

## 2023-03-14 DIAGNOSIS — E876 Hypokalemia: Secondary | ICD-10-CM | POA: Diagnosis not present

## 2023-03-14 DIAGNOSIS — I69354 Hemiplegia and hemiparesis following cerebral infarction affecting left non-dominant side: Secondary | ICD-10-CM | POA: Diagnosis not present

## 2023-03-15 DIAGNOSIS — I129 Hypertensive chronic kidney disease with stage 1 through stage 4 chronic kidney disease, or unspecified chronic kidney disease: Secondary | ICD-10-CM | POA: Diagnosis not present

## 2023-03-15 DIAGNOSIS — M545 Low back pain, unspecified: Secondary | ICD-10-CM | POA: Diagnosis not present

## 2023-03-15 DIAGNOSIS — N1832 Chronic kidney disease, stage 3b: Secondary | ICD-10-CM | POA: Diagnosis not present

## 2023-03-15 DIAGNOSIS — I739 Peripheral vascular disease, unspecified: Secondary | ICD-10-CM | POA: Diagnosis not present

## 2023-03-15 DIAGNOSIS — I69354 Hemiplegia and hemiparesis following cerebral infarction affecting left non-dominant side: Secondary | ICD-10-CM | POA: Diagnosis not present

## 2023-03-15 DIAGNOSIS — E0520 Thyrotoxicosis with toxic multinodular goiter without thyrotoxic crisis or storm: Secondary | ICD-10-CM | POA: Diagnosis not present

## 2023-03-15 DIAGNOSIS — M1990 Unspecified osteoarthritis, unspecified site: Secondary | ICD-10-CM | POA: Diagnosis not present

## 2023-03-15 DIAGNOSIS — E876 Hypokalemia: Secondary | ICD-10-CM | POA: Diagnosis not present

## 2023-03-15 DIAGNOSIS — I700 Atherosclerosis of aorta: Secondary | ICD-10-CM | POA: Diagnosis not present

## 2023-03-17 ENCOUNTER — Ambulatory Visit
Admit: 2023-03-17 | Discharge: 2023-03-18 | Payer: MEDICARE | Attending: Student in an Organized Health Care Education/Training Program | Primary: Student in an Organized Health Care Education/Training Program

## 2023-03-17 DIAGNOSIS — C4491 Basal cell carcinoma of skin, unspecified: Principal | ICD-10-CM

## 2023-03-17 DIAGNOSIS — L400 Psoriasis vulgaris: Secondary | ICD-10-CM | POA: Diagnosis not present

## 2023-03-17 DIAGNOSIS — R21 Rash and other nonspecific skin eruption: Secondary | ICD-10-CM | POA: Diagnosis not present

## 2023-03-17 MED ORDER — TRIAMCINOLONE ACETONIDE 0.1 % TOPICAL OINTMENT
0 refills | 0.00000 days | Status: CN
Start: 2023-03-17 — End: ?

## 2023-03-24 DIAGNOSIS — I129 Hypertensive chronic kidney disease with stage 1 through stage 4 chronic kidney disease, or unspecified chronic kidney disease: Secondary | ICD-10-CM | POA: Diagnosis not present

## 2023-03-24 DIAGNOSIS — E0520 Thyrotoxicosis with toxic multinodular goiter without thyrotoxic crisis or storm: Secondary | ICD-10-CM | POA: Diagnosis not present

## 2023-03-24 DIAGNOSIS — N1832 Chronic kidney disease, stage 3b: Secondary | ICD-10-CM | POA: Diagnosis not present

## 2023-03-24 DIAGNOSIS — M1990 Unspecified osteoarthritis, unspecified site: Secondary | ICD-10-CM | POA: Diagnosis not present

## 2023-03-24 DIAGNOSIS — I700 Atherosclerosis of aorta: Secondary | ICD-10-CM | POA: Diagnosis not present

## 2023-03-24 DIAGNOSIS — I739 Peripheral vascular disease, unspecified: Secondary | ICD-10-CM | POA: Diagnosis not present

## 2023-03-24 DIAGNOSIS — E876 Hypokalemia: Secondary | ICD-10-CM | POA: Diagnosis not present

## 2023-03-24 DIAGNOSIS — I69354 Hemiplegia and hemiparesis following cerebral infarction affecting left non-dominant side: Secondary | ICD-10-CM | POA: Diagnosis not present

## 2023-03-24 DIAGNOSIS — M545 Low back pain, unspecified: Secondary | ICD-10-CM | POA: Diagnosis not present

## 2023-03-24 DIAGNOSIS — L400 Psoriasis vulgaris: Principal | ICD-10-CM

## 2023-03-24 MED ORDER — RISANKIZUMAB-RZAA 150 MG/ML SUBCUTANEOUS PEN INJECTOR
SUBCUTANEOUS | 0 refills | 0.00000 days | Status: CP
Start: 2023-03-24 — End: ?

## 2023-03-25 DIAGNOSIS — L400 Psoriasis vulgaris: Principal | ICD-10-CM

## 2023-03-25 DIAGNOSIS — M1990 Unspecified osteoarthritis, unspecified site: Secondary | ICD-10-CM | POA: Diagnosis not present

## 2023-03-25 DIAGNOSIS — I700 Atherosclerosis of aorta: Secondary | ICD-10-CM | POA: Diagnosis not present

## 2023-03-25 DIAGNOSIS — M545 Low back pain, unspecified: Secondary | ICD-10-CM | POA: Diagnosis not present

## 2023-03-25 DIAGNOSIS — I69354 Hemiplegia and hemiparesis following cerebral infarction affecting left non-dominant side: Secondary | ICD-10-CM | POA: Diagnosis not present

## 2023-03-25 DIAGNOSIS — E876 Hypokalemia: Secondary | ICD-10-CM | POA: Diagnosis not present

## 2023-03-25 DIAGNOSIS — I739 Peripheral vascular disease, unspecified: Secondary | ICD-10-CM | POA: Diagnosis not present

## 2023-03-25 DIAGNOSIS — N1832 Chronic kidney disease, stage 3b: Secondary | ICD-10-CM | POA: Diagnosis not present

## 2023-03-25 DIAGNOSIS — E0520 Thyrotoxicosis with toxic multinodular goiter without thyrotoxic crisis or storm: Secondary | ICD-10-CM | POA: Diagnosis not present

## 2023-03-25 DIAGNOSIS — I129 Hypertensive chronic kidney disease with stage 1 through stage 4 chronic kidney disease, or unspecified chronic kidney disease: Secondary | ICD-10-CM | POA: Diagnosis not present

## 2023-03-28 DIAGNOSIS — I700 Atherosclerosis of aorta: Secondary | ICD-10-CM | POA: Diagnosis not present

## 2023-03-28 DIAGNOSIS — E876 Hypokalemia: Secondary | ICD-10-CM | POA: Diagnosis not present

## 2023-03-28 DIAGNOSIS — I69354 Hemiplegia and hemiparesis following cerebral infarction affecting left non-dominant side: Secondary | ICD-10-CM | POA: Diagnosis not present

## 2023-03-28 DIAGNOSIS — N1832 Chronic kidney disease, stage 3b: Secondary | ICD-10-CM | POA: Diagnosis not present

## 2023-03-28 DIAGNOSIS — I739 Peripheral vascular disease, unspecified: Secondary | ICD-10-CM | POA: Diagnosis not present

## 2023-03-28 DIAGNOSIS — I129 Hypertensive chronic kidney disease with stage 1 through stage 4 chronic kidney disease, or unspecified chronic kidney disease: Secondary | ICD-10-CM | POA: Diagnosis not present

## 2023-03-28 DIAGNOSIS — M545 Low back pain, unspecified: Secondary | ICD-10-CM | POA: Diagnosis not present

## 2023-03-28 DIAGNOSIS — E0520 Thyrotoxicosis with toxic multinodular goiter without thyrotoxic crisis or storm: Secondary | ICD-10-CM | POA: Diagnosis not present

## 2023-03-28 DIAGNOSIS — M1990 Unspecified osteoarthritis, unspecified site: Secondary | ICD-10-CM | POA: Diagnosis not present

## 2023-04-07 ENCOUNTER — Ambulatory Visit
Admit: 2023-04-07 | Discharge: 2023-04-08 | Payer: MEDICARE | Attending: MOHS-Micrographic Surgery | Primary: MOHS-Micrographic Surgery

## 2023-04-07 DIAGNOSIS — C44719 Basal cell carcinoma of skin of left lower limb, including hip: Secondary | ICD-10-CM | POA: Diagnosis not present

## 2023-04-07 DIAGNOSIS — L814 Other melanin hyperpigmentation: Secondary | ICD-10-CM | POA: Diagnosis not present

## 2023-04-07 DIAGNOSIS — L578 Other skin changes due to chronic exposure to nonionizing radiation: Secondary | ICD-10-CM | POA: Diagnosis not present

## 2023-04-07 DIAGNOSIS — Z85828 Personal history of other malignant neoplasm of skin: Secondary | ICD-10-CM | POA: Diagnosis not present

## 2023-05-10 ENCOUNTER — Ambulatory Visit: Payer: Medicare HMO | Admitting: Neurology

## 2023-05-10 ENCOUNTER — Encounter: Payer: Self-pay | Admitting: Neurology

## 2023-05-10 ENCOUNTER — Telehealth: Payer: Self-pay | Admitting: Neurology

## 2023-05-10 VITALS — BP 138/87 | HR 84 | Ht 66.0 in | Wt 170.0 lb

## 2023-05-10 DIAGNOSIS — I69354 Hemiplegia and hemiparesis following cerebral infarction affecting left non-dominant side: Secondary | ICD-10-CM

## 2023-05-10 DIAGNOSIS — I69398 Other sequelae of cerebral infarction: Secondary | ICD-10-CM | POA: Diagnosis not present

## 2023-05-10 DIAGNOSIS — I679 Cerebrovascular disease, unspecified: Secondary | ICD-10-CM

## 2023-05-10 DIAGNOSIS — R269 Unspecified abnormalities of gait and mobility: Secondary | ICD-10-CM

## 2023-05-10 NOTE — Progress Notes (Signed)
[facility] [address] [place] [place] [address] [phone]       OFFICE CONSULT NOTE  Ms. Herb Grays Date of Birth:  1946-02-07 Medical Record Number:  [id number]   Referring MD:  Jerl Mina  Reason for Referral: Stroke  HPI: Ms. Norgren is a 77 year old pleasant Caucasian lady seen today for office consultation visit for stroke.  History is obtained from the patient and her daughter is accompanying her as well as review of referral notes and electronic medical records.  I personally reviewed pertinent available imaging films in PACS.  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  She presented initially in April 2024 with admission for UTI and hypotension with systolic blood pressure dropping to 70.  During this episode she was noted to be dysarthric with left facial droop.  MRI showed new acute to subacute infarcts in the periventricular white matter in the right frontal lobe and periatrial white matter.  CT angiogram showed multifocal severe stenosis involving different intracranial vessels.  She presented again in July 2024 this time with difficulty walking with bilateral leg weakness following a fall with some hip pain.  Initial hip imaging showed a contusion without fracture but subsequently she was noted to have a small fracture.  She had left-sided weakness at this time.  MRI scan on 01/24/2023 showed multiple small foci of acute ischemia in the right basal ganglia, posterior limb of internal capsule and right caudate head.  There are also changes of chronic microhemorrhages and small vessel disease. CT angiogram of the brain and neck showed chronic left M1 occlusion as well as multifocal moderate stenosis of the right MCA M2 and M3 branches.  There was mild bilateral carotid bifurcation atherosclerosis without significant  stenosis.  Echocardiogram showed ejection fraction of 70 to 75%.  Left atrium size is normal.  Bubble study was negative for right-to-left shunt.  LDL cholesterol 03/08/2023 was 69 mg percent.  Patient was continued on aspirin and Plavix.  She has finished some home physical occupational therapy.  She is still has trouble walking she has use a walker but the daughter does not trust her and is usually closely behind her.  She is not sure if her walking difficulty is limited by her hip pain and for which she needs surgery or evaluated as weakness from her stroke.  She was offered outpatient physical Occupational Therapy but they were not happy with the place and hence did not finish the full course of therapy.  She is tolerating aspirin and Plavix well without bruit bleeding or bruising.  She is starting Lipitor well without muscle aches and pains.  Blood pressure is under good control.  Patient is living with her daughter.  She can get up and walk by her self for a short distance but is dependent on her daughter for a lot of activities of daily living.  Prior Office visit 02/28/2019 : HPI: KINLYNN WIATROWSKI being seen today for in office hospital follow-up regarding multiple small left ACA territory infarcts on 01/20/2019.  History obtained from patient, daughter and chart review. Reviewed all radiology images and labs personally.  Ms. MADAI HERNANDEZGARCI is a 77 y.o. female with history of a TIA / stroke, ongoing tobacco use, and hypertension  presented to [facility] ED on 01/20/2019 with right sided weakness that started improving enroute.   Neurology consulted (Dr. Roda Shutters) and stroke  work-up revealed multiple small left ACA territory infarcts possibly secondary to large vessel disease source but unable to rule out cardioembolic source.  She did not receive IV t-PA due to improving / mild deficits.  MRI head showed left anterior cerebral anterior territory infarcts and severe chronic ischemic microangiopathy.  CTA head/neck showed  chronic proximal left M1 occlusion with distal collaterals, high-grade stenosis of the dominant right vertebral artery at its origin, tandem stenosis of the right vertebral artery at the C6 level and bilateral dense calcification ICA bulbs.  2D echo unremarkable without cardiac source of embolus or PFO.  Recommended undergoing 30-day cardiac event monitor outpatient to rule out atrial fibrillation.  Initiated DAPT for 3 months given intracranial stenosis/occlusion and aspirin alone.  HTN stable with recommendation of BP goal 1 30-1 50 given chronic left M1 occlusion.  LDL 105 initiate atorvastatin 40 mg daily.  A1c 5.7 without evidence of DM.  Current tobacco use of smoking cessation counseling provided.  Other shortness factors include prior stroke, advanced age, THC use and overweight.  She was discharged home in stable condition with with recommendation of outpatient therapy for continued right lower extremity weakness.   Ms. Fayrene Fearing is being seen today for hospital follow-up and is accompanied by her daughter.  She continues to have residual right-sided weakness but does endorse improvement.  Daughter states that initially upon returning home, she did have worsening of right-sided weakness impairing mobility and functional use of right side.  Once therapies were started approximately 1 week later, weakness significantly improved.  No other symptoms accompanied worsening weakness.  She was initially using a rolling walker but has now graduated to a cane that she will use outdoors and is typically not needed indoors.  She did have a couple falls initially returning to home but no falls recently.  She continues to participate in PT/OT along with maintaining HEP.  Continues aspirin 81 mg and clopidogrel 75 mg daily without bleeding or bruising.  Continues atorvastatin without myalgias.  Blood pressure today satisfactory at 134/90.  She completed 30-day cardiac event monitor yesterday and daughter plans on mailing  back today or tomorrow.  Continues tobacco use but has drastically decreased from previously smoking 1 pack/day and now 3 to 5/day.  She does plan on continuation of decreasing now until complete cessation. She was evaluated by vascular surgery who did not recommend any surgical intervention at that time and to follow-up in 1 year for surveillance monitoring.  No further concerns at this time.   ROS:   14 system review of systems is positive for hip pain, leg weakness, difficulty walking, imbalance all other systems negative  PMH:  Past Medical History:  Diagnosis Date   Arthritis    knees, hands   H/O thyroid disease    No issues currently   HOH (hard of hearing)    Hypertension    Stroke (HCC) 12/2018   Wears hearing aid in both ears    has, does not wear    Social History:  Social History   Socioeconomic History   Marital status: Divorced    Spouse name: Not on file   Number of children: Not on file   Years of education: Not on file   Highest education level: Not on file  Occupational History   Not on file  Tobacco Use   Smoking status: Every Day    Current packs/day: 0.50    Average packs/day: 0.5 packs/day for 38.0 years (19.0 ttl pk-yrs)  Types: Cigarettes    Start date: 03/10/1987   Smokeless tobacco: Never   Tobacco comments:    since age 48  Vaping Use   Vaping status: Never Used  Substance and Sexual Activity   Alcohol use: No   Drug use: No   Sexual activity: Not on file  Other Topics Concern   Not on file  Social History Narrative   Not on file   Social Determinants of Health   Financial Resource Strain: Patient Declined (11/23/2022)   Received from [facility]   Overall Financial Resource Strain (CARDIA)    Difficulty of Paying Living Expenses: Patient declined  Food Insecurity: No Food Insecurity (01/25/2023)   Hunger Vital Sign    Worried About Running Out of Food in the Last Year: Never true    Ran Out of Food in the Last  Year: Never true  Transportation Needs: No Transportation Needs (01/25/2023)   PRAPARE - Administrator, Civil Service (Medical): No    Lack of Transportation (Non-Medical): No  Physical Activity: Unknown (06/09/2017)   Exercise Vital Sign    Days of Exercise per Week: Patient declined    Minutes of Exercise per Session: Patient declined  Stress: No Stress Concern Present (06/09/2017)   [employer] of Occupational Health - Occupational Stress Questionnaire    Feeling of Stress : Not at all  Social Connections: Unknown (06/09/2017)   Social Connection and Isolation Panel [NHANES]    Frequency of Communication with Friends and Family: Patient declined    Frequency of Social Gatherings with Friends and Family: Patient declined    Attends Religious Services: Patient declined    Database administrator or Organizations: Patient declined    Attends Banker Meetings: Patient declined    Marital Status: Patient declined  Intimate Partner Violence: Not At Risk (01/25/2023)   Humiliation, Afraid, Rape, and Kick questionnaire    Fear of Current or Ex-Partner: No    Emotionally Abused: No    Physically Abused: No    Sexually Abused: No    Medications:   Current Outpatient Medications on File Prior to Visit  Medication Sig Dispense Refill   acetaminophen (TYLENOL) 325 MG tablet Take 2 tablets (650 mg total) by mouth every 6 (six) hours as needed for moderate pain, mild pain, headache or fever. 100 tablet 2   amLODipine (NORVASC) 5 MG tablet Take 2 tablets (10 mg total) by mouth at bedtime as needed (Systolic BP greater than 150 mmHg). 60 tablet 11   aspirin EC 81 MG tablet Take 81 mg by mouth daily. Swallow whole.     atorvastatin (LIPITOR) 40 MG tablet Take 1 tablet (40 mg total) by mouth daily. 30 tablet 0   clobetasol (TEMOVATE) 0.05 % external solution Continue to apply twice daily to the scalp for itching     clopidogrel (PLAVIX) 75 MG tablet Take 1 tablet (75  mg total) by mouth daily. 90 tablet 3   lisinopril (ZESTRIL) 20 MG tablet Take 1 tablet (20 mg total) by mouth daily. Skip the dose if systolic BP less than 130 mmHg 30 tablet 11   meclizine (ANTIVERT) 12.5 MG tablet Take 12.5 mg by mouth 3 (three) times daily as needed for dizziness.     mirtazapine (REMERON) 15 MG tablet Take 15 mg by mouth at bedtime. (Patient not taking: Reported on 05/10/2023)     No current facility-administered medications on file prior to visit.    Allergies:  No  Known Allergies  Physical Exam General: Frail elderly Caucasian lady seated, in no evident distress Head: head normocephalic and atraumatic.   Neck: supple with no carotid or supraclavicular bruits Cardiovascular: regular rate and rhythm, no murmurs Musculoskeletal: no deformity Skin:  no rash/petichiae Vascular:  Normal pulses all extremities  Neurologic Exam Mental Status: Awake and fully alert. Oriented to place and time. Recent and remote memory intact. Attention span, concentration and fund of knowledge appropriate. Mood and affect appropriate.  Cranial Nerves: Fundoscopic exam reveals sharp disc margins. Pupils equal, briskly reactive to light. Extraocular movements full without nystagmus. Visual fields full to confrontation. Hearing intact. Facial sensation intact.  Mild lower left facial weakness, tongue, palate moves normally and symmetrically.  Motor: Bilateral hip flexor weakness with lower extremity drift.  Mild weakness of left hip flexor, ankle dorsiflexors.  Diminished fine finger movements on the left.  Orbits right over left upper extremity.  Mild left grip weakness.  Normal strength in the right upper extremity. Sensory.: intact to touch , pinprick , position and vibratory sensation.  Coordination: Rapid alternating movements normal in all extremities. Finger-to-nose and heel-to-shin performed accurately bilaterally. Gait and Station: Deferred as patient did not bring her walker and is in a  wheelchair.   Reflexes: 2+ and asymmetric and brisker on the left. Toes downgoing.   NIHSS  3 Modified Rankin  3   ASSESSMENT: 77 year old Caucasian lady with right subcortical infarct due to symptomatic intracranial MCA stenosis in July 2024 with residual left hemiparesis.  She has multifocal gait difficulty secondary to combination of hip fracture and pain as well as deficits from her stroke.  Vascular risk factors of hypertension, hyperlipidemia, smoking, previous stroke and intracranial stenosis     PLAN:I had a long d/w patient and her daughter about her recent stroke, intracranial stenosis, risk for recurrent stroke/TIAs, personally independently reviewed imaging studies and stroke evaluation results and answered questions.patient is not a candidate for angioplasty stenting at this time given multi focal stenosis and poor baseline function.  Continue aspirin 81 mg daily and clopidogrel 75 mg daily  for secondary stroke prevention and maintain strict control of hypertension with blood pressure goal below 130/90, diabetes with hemoglobin A1c goal below 6.5% and lipids with LDL cholesterol goal below 70 mg/dL. I also advised the patient to eat a healthy diet with plenty of whole grains, cereals, fruits and vegetables, exercise regularly and maintain ideal body weight .patient may also consider possible participation in the  CAPTIVA stroke prevention study (aspirin and Plavix versus aspirin and Brilinta versus aspirin and low-dose Xarelto ) if interested and was given information to review at home and decide.  She was also counseled to quit smoking completely.  She was encouraged to use her walker at all times and we discussed fall and safety precautions.  Referred for outpatient physical and Occupational Therapy to improve gait and balance.  Followup in the future with my nurse practitioner in 3 months or call earlier if necessary.  Greater than 50% time during this 45-minute consultation visit was  spent in counseling and coordination of care about her stroke and intracranial stenosis and discussion about treatment and answering questions  Delia Heady, MD Note: This document was prepared with digital dictation and possible smart phrase technology. Any transcriptional errors that result from this process are unintentional.

## 2023-05-10 NOTE — Patient Instructions (Signed)
I had a long d/w patient and her daughter about her recent stroke, intracranial stenosis, risk for recurrent stroke/TIAs, personally independently reviewed imaging studies and stroke evaluation results and answered questions.patient is not a candidate for angioplasty stenting at this time given multi focal stenosis and poor baseline function.  Continue aspirin 81 mg daily and clopidogrel 75 mg daily  for secondary stroke prevention and maintain strict control of hypertension with blood pressure goal below 130/90, diabetes with hemoglobin A1c goal below 6.5% and lipids with LDL cholesterol goal below 70 mg/dL. I also advised the patient to eat a healthy diet with plenty of whole grains, cereals, fruits and vegetables, exercise regularly and maintain ideal body weight .patient may also consider possible participation in the  CAPTIVA stroke prevention study (aspirin and Plavix versus aspirin and Brilinta versus aspirin and low-dose Xarelto ) if interested and was given information to review at home and decide.  She was encouraged to use her walker at all times and we discussed fall and safety precautions.  Referred for outpatient physical and Occupational Therapy to improve gait and balance.  Followup in the future with my nurse practitioner in 3 months or call earlier if necessary.  Stroke Prevention Some medical conditions and behaviors can lead to a higher chance of having a stroke. You can help prevent a stroke by eating healthy, exercising, not smoking, and managing any medical conditions you have. Stroke is a leading cause of functional impairment. Primary prevention is particularly important because a majority of strokes are first-time events. Stroke changes the lives of not only those who experience a stroke but also their family and other caregivers. How can this condition affect me? A stroke is a medical emergency and should be treated right away. A stroke can lead to brain damage and can sometimes be  life-threatening. If a person gets medical treatment right away, there is a better chance of surviving and recovering from a stroke. What can increase my risk? The following medical conditions may increase your risk of a stroke: Cardiovascular disease. High blood pressure (hypertension). Diabetes. High cholesterol. Sickle cell disease. Blood clotting disorders (hypercoagulable state). Obesity. Sleep disorders (obstructive sleep apnea). Other risk factors include: Being older than age 13. Having a history of blood clots, stroke, or mini-stroke (transient ischemic attack, TIA). Genetic factors, such as race, ethnicity, or a family history of stroke. Smoking cigarettes or using other tobacco products. Taking birth control pills, especially if you also use tobacco. Heavy use of alcohol or drugs, especially cocaine and methamphetamine. Physical inactivity. What actions can I take to prevent this? Manage your health conditions High cholesterol levels. Eating a healthy diet is important for preventing high cholesterol. If cholesterol cannot be managed through diet alone, you may need to take medicines. Take any prescribed medicines to control your cholesterol as told by your health care provider. Hypertension. To reduce your risk of stroke, try to keep your blood pressure below 130/80. Eating a healthy diet and exercising regularly are important for controlling blood pressure. If these steps are not enough to manage your blood pressure, you may need to take medicines. Take any prescribed medicines to control hypertension as told by your health care provider. Ask your health care provider if you should monitor your blood pressure at home. Have your blood pressure checked every year, even if your blood pressure is normal. Blood pressure increases with age and some medical conditions. Diabetes. Eating a healthy diet and exercising regularly are important parts of managing your blood  sugar  (glucose). If your blood sugar cannot be managed through diet and exercise, you may need to take medicines. Take any prescribed medicines to control your diabetes as told by your health care provider. Get evaluated for obstructive sleep apnea. Talk to your health care provider about getting a sleep evaluation if you snore a lot or have excessive sleepiness. Make sure that any other medical conditions you have, such as atrial fibrillation or atherosclerosis, are managed. Nutrition Follow instructions from your health care provider about what to eat or drink to help manage your health condition. These instructions may include: Reducing your daily calorie intake. Limiting how much salt (sodium) you use to 1,500 milligrams (mg) each day. Using only healthy fats for cooking, such as olive oil, canola oil, or sunflower oil. Eating healthy foods. You can do this by: Choosing foods that are high in fiber, such as whole grains, and fresh fruits and vegetables. Eating at least 5 servings of fruits and vegetables a day. Try to fill one-half of your plate with fruits and vegetables at each meal. Choosing lean protein foods, such as lean cuts of meat, poultry without skin, fish, tofu, beans, and nuts. Eating low-fat dairy products. Avoiding foods that are high in sodium. This can help lower blood pressure. Avoiding foods that have saturated fat, trans fat, and cholesterol. This can help prevent high cholesterol. Avoiding processed and prepared foods. Counting your daily carbohydrate intake.  Lifestyle If you drink alcohol: Limit how much you have to: 0-1 drink a day for women who are not pregnant. 0-2 drinks a day for men. Know how much alcohol is in your drink. In the [place] one drink equals one 12 oz bottle of beer ( ), one 5 oz glass of wine ( ), or one 1 oz glass of hard liquor (44mL). Do not use any products that contain nicotine or tobacco. These products include cigarettes, chewing  tobacco, and vaping devices, such as e-cigarettes. If you need help quitting, ask your health care provider. Avoid secondhand smoke. Do not use drugs. Activity  Try to stay at a healthy weight. Get at least 30 minutes of exercise on most days, such as: Fast walking. Biking. Swimming. Medicines Take over-the-counter and prescription medicines only as told by your health care provider. Aspirin or blood thinners (antiplatelets or anticoagulants) may be recommended to reduce your risk of forming blood clots that can lead to stroke. Avoid taking birth control pills. Talk to your health care provider about the risks of taking birth control pills if: You are over 70 years old. You smoke. You get very bad headaches. You have had a blood clot. Where to find more information American Stroke Association: www.strokeassociation.org Get help right away if: You or a loved one has any symptoms of a stroke. "BE FAST" is an easy way to remember the main warning signs of a stroke: B - Balance. Signs are dizziness, sudden trouble walking, or loss of balance. E - Eyes. Signs are trouble seeing or a sudden change in vision. F - Face. Signs are sudden weakness or numbness of the face, or the face or eyelid drooping on one side. A - Arms. Signs are weakness or numbness in an arm. This happens suddenly and usually on one side of the body. S - Speech. Signs are sudden trouble speaking, slurred speech, or trouble understanding what people say. T - Time. Time to call emergency services. Write down what time symptoms started. You or a loved one has other signs of a  stroke, such as: A sudden, severe headache with no known cause. Nausea or vomiting. Seizure. These symptoms may represent a serious problem that is an emergency. Do not wait to see if the symptoms will go away. Get medical help right away. Call your local emergency services (911 in the [place] Do not drive yourself to the hospital. Summary You can help  to prevent a stroke by eating healthy, exercising, not smoking, limiting alcohol intake, and managing any medical conditions you may have. Do not use any products that contain nicotine or tobacco. These include cigarettes, chewing tobacco, and vaping devices, such as e-cigarettes. If you need help quitting, ask your health care provider. Remember "BE FAST" for warning signs of a stroke. Get help right away if you or a loved one has any of these signs. This information is not intended to replace advice given to you by your health care provider. Make sure you discuss any questions you have with your health care provider. Document Revised: 05/17/2022 Document Reviewed: 05/17/2022 Elsevier Patient Education  2024 [employer].

## 2023-05-10 NOTE — Telephone Encounter (Signed)
Physical and occupational referrals faxed to [facility] (fax# [phone], phone# [phone])

## 2023-05-16 DIAGNOSIS — I69398 Other sequelae of cerebral infarction: Secondary | ICD-10-CM | POA: Diagnosis not present

## 2023-05-16 DIAGNOSIS — M25551 Pain in right hip: Secondary | ICD-10-CM | POA: Diagnosis not present

## 2023-05-16 DIAGNOSIS — I679 Cerebrovascular disease, unspecified: Secondary | ICD-10-CM | POA: Diagnosis not present

## 2023-05-16 DIAGNOSIS — L409 Psoriasis, unspecified: Secondary | ICD-10-CM | POA: Diagnosis not present

## 2023-05-16 DIAGNOSIS — G4700 Insomnia, unspecified: Secondary | ICD-10-CM | POA: Diagnosis not present

## 2023-05-16 DIAGNOSIS — I129 Hypertensive chronic kidney disease with stage 1 through stage 4 chronic kidney disease, or unspecified chronic kidney disease: Secondary | ICD-10-CM | POA: Diagnosis not present

## 2023-05-16 DIAGNOSIS — R269 Unspecified abnormalities of gait and mobility: Secondary | ICD-10-CM | POA: Diagnosis not present

## 2023-05-16 DIAGNOSIS — N1831 Chronic kidney disease, stage 3a: Secondary | ICD-10-CM | POA: Diagnosis not present

## 2023-05-16 DIAGNOSIS — R531 Weakness: Secondary | ICD-10-CM | POA: Diagnosis not present

## 2023-05-25 DIAGNOSIS — R262 Difficulty in walking, not elsewhere classified: Secondary | ICD-10-CM | POA: Diagnosis not present

## 2023-05-25 DIAGNOSIS — M799 Soft tissue disorder, unspecified: Secondary | ICD-10-CM | POA: Diagnosis not present

## 2023-05-25 DIAGNOSIS — M6281 Muscle weakness (generalized): Secondary | ICD-10-CM | POA: Diagnosis not present

## 2023-05-30 DIAGNOSIS — M6281 Muscle weakness (generalized): Secondary | ICD-10-CM | POA: Diagnosis not present

## 2023-05-30 DIAGNOSIS — M799 Soft tissue disorder, unspecified: Secondary | ICD-10-CM | POA: Diagnosis not present

## 2023-05-30 DIAGNOSIS — R262 Difficulty in walking, not elsewhere classified: Secondary | ICD-10-CM | POA: Diagnosis not present

## 2023-06-01 DIAGNOSIS — M799 Soft tissue disorder, unspecified: Secondary | ICD-10-CM | POA: Diagnosis not present

## 2023-06-01 DIAGNOSIS — M6281 Muscle weakness (generalized): Secondary | ICD-10-CM | POA: Diagnosis not present

## 2023-06-01 DIAGNOSIS — R262 Difficulty in walking, not elsewhere classified: Secondary | ICD-10-CM | POA: Diagnosis not present

## 2023-06-08 DIAGNOSIS — R262 Difficulty in walking, not elsewhere classified: Secondary | ICD-10-CM | POA: Diagnosis not present

## 2023-06-08 DIAGNOSIS — M6281 Muscle weakness (generalized): Secondary | ICD-10-CM | POA: Diagnosis not present

## 2023-06-08 DIAGNOSIS — M799 Soft tissue disorder, unspecified: Secondary | ICD-10-CM | POA: Diagnosis not present

## 2023-06-15 DIAGNOSIS — M799 Soft tissue disorder, unspecified: Secondary | ICD-10-CM | POA: Diagnosis not present

## 2023-06-15 DIAGNOSIS — M6281 Muscle weakness (generalized): Secondary | ICD-10-CM | POA: Diagnosis not present

## 2023-06-15 DIAGNOSIS — R262 Difficulty in walking, not elsewhere classified: Secondary | ICD-10-CM | POA: Diagnosis not present

## 2023-06-16 ENCOUNTER — Ambulatory Visit
Admit: 2023-06-16 | Discharge: 2023-06-17 | Payer: MEDICARE | Attending: Student in an Organized Health Care Education/Training Program | Primary: Student in an Organized Health Care Education/Training Program

## 2023-06-16 DIAGNOSIS — R21 Rash and other nonspecific skin eruption: Principal | ICD-10-CM

## 2023-06-16 DIAGNOSIS — L400 Psoriasis vulgaris: Secondary | ICD-10-CM | POA: Diagnosis not present

## 2023-06-16 DIAGNOSIS — Z79899 Other long term (current) drug therapy: Secondary | ICD-10-CM | POA: Diagnosis not present

## 2023-06-17 DIAGNOSIS — M6281 Muscle weakness (generalized): Secondary | ICD-10-CM | POA: Diagnosis not present

## 2023-06-17 DIAGNOSIS — R262 Difficulty in walking, not elsewhere classified: Secondary | ICD-10-CM | POA: Diagnosis not present

## 2023-06-17 DIAGNOSIS — M799 Soft tissue disorder, unspecified: Secondary | ICD-10-CM | POA: Diagnosis not present

## 2023-07-07 DIAGNOSIS — M799 Soft tissue disorder, unspecified: Secondary | ICD-10-CM | POA: Diagnosis not present

## 2023-07-07 DIAGNOSIS — M6281 Muscle weakness (generalized): Secondary | ICD-10-CM | POA: Diagnosis not present

## 2023-07-07 DIAGNOSIS — R262 Difficulty in walking, not elsewhere classified: Secondary | ICD-10-CM | POA: Diagnosis not present

## 2023-07-13 DIAGNOSIS — R262 Difficulty in walking, not elsewhere classified: Secondary | ICD-10-CM | POA: Diagnosis not present

## 2023-07-13 DIAGNOSIS — M6281 Muscle weakness (generalized): Secondary | ICD-10-CM | POA: Diagnosis not present

## 2023-07-13 DIAGNOSIS — M799 Soft tissue disorder, unspecified: Secondary | ICD-10-CM | POA: Diagnosis not present

## 2023-07-20 DIAGNOSIS — R262 Difficulty in walking, not elsewhere classified: Secondary | ICD-10-CM | POA: Diagnosis not present

## 2023-07-20 DIAGNOSIS — M6281 Muscle weakness (generalized): Secondary | ICD-10-CM | POA: Diagnosis not present

## 2023-07-20 DIAGNOSIS — M799 Soft tissue disorder, unspecified: Secondary | ICD-10-CM | POA: Diagnosis not present

## 2023-08-03 DIAGNOSIS — M6281 Muscle weakness (generalized): Secondary | ICD-10-CM | POA: Diagnosis not present

## 2023-08-03 DIAGNOSIS — M799 Soft tissue disorder, unspecified: Secondary | ICD-10-CM | POA: Diagnosis not present

## 2023-08-03 DIAGNOSIS — R262 Difficulty in walking, not elsewhere classified: Secondary | ICD-10-CM | POA: Diagnosis not present

## 2023-08-05 DIAGNOSIS — M799 Soft tissue disorder, unspecified: Secondary | ICD-10-CM | POA: Diagnosis not present

## 2023-08-05 DIAGNOSIS — M6281 Muscle weakness (generalized): Secondary | ICD-10-CM | POA: Diagnosis not present

## 2023-08-05 DIAGNOSIS — R262 Difficulty in walking, not elsewhere classified: Secondary | ICD-10-CM | POA: Diagnosis not present

## 2023-08-12 DIAGNOSIS — M799 Soft tissue disorder, unspecified: Secondary | ICD-10-CM | POA: Diagnosis not present

## 2023-08-12 DIAGNOSIS — R262 Difficulty in walking, not elsewhere classified: Secondary | ICD-10-CM | POA: Diagnosis not present

## 2023-08-12 DIAGNOSIS — M6281 Muscle weakness (generalized): Secondary | ICD-10-CM | POA: Diagnosis not present

## 2023-08-31 DIAGNOSIS — M6281 Muscle weakness (generalized): Secondary | ICD-10-CM | POA: Diagnosis not present

## 2023-08-31 DIAGNOSIS — R262 Difficulty in walking, not elsewhere classified: Secondary | ICD-10-CM | POA: Diagnosis not present

## 2023-08-31 DIAGNOSIS — M799 Soft tissue disorder, unspecified: Secondary | ICD-10-CM | POA: Diagnosis not present

## 2023-09-02 DIAGNOSIS — M799 Soft tissue disorder, unspecified: Secondary | ICD-10-CM | POA: Diagnosis not present

## 2023-09-02 DIAGNOSIS — M6281 Muscle weakness (generalized): Secondary | ICD-10-CM | POA: Diagnosis not present

## 2023-09-02 DIAGNOSIS — R262 Difficulty in walking, not elsewhere classified: Secondary | ICD-10-CM | POA: Diagnosis not present

## 2023-09-07 DIAGNOSIS — M799 Soft tissue disorder, unspecified: Secondary | ICD-10-CM | POA: Diagnosis not present

## 2023-09-07 DIAGNOSIS — M6281 Muscle weakness (generalized): Secondary | ICD-10-CM | POA: Diagnosis not present

## 2023-09-07 DIAGNOSIS — R262 Difficulty in walking, not elsewhere classified: Secondary | ICD-10-CM | POA: Diagnosis not present

## 2023-09-14 DIAGNOSIS — R262 Difficulty in walking, not elsewhere classified: Secondary | ICD-10-CM | POA: Diagnosis not present

## 2023-09-14 DIAGNOSIS — M6281 Muscle weakness (generalized): Secondary | ICD-10-CM | POA: Diagnosis not present

## 2023-09-14 DIAGNOSIS — M799 Soft tissue disorder, unspecified: Secondary | ICD-10-CM | POA: Diagnosis not present

## 2023-09-14 NOTE — Unmapped (Signed)
 [facility] Specialty Medication Onboarding    Specialty Medication: Careers adviser  Prior Authorization: Approved   Financial Assistance: No - MAPs has reached maximum number of attempts to obtain necessary information from the patient in regards to the financial assistance process  Final Copay/Day Supply: $1961.19 / 28 days (LD) & 84 days (MD)    Insurance Restrictions: None     Notes to Pharmacist: pt eligable for MP3   Credit Card on File: no  Start Date on Rx:      The triage team has completed the benefits investigation and has determined that the patient is able to fill this medication at [facility]. Please contact the patient to complete the onboarding or follow up with the prescribing physician as needed.

## 2023-09-15 NOTE — Unmapped (Signed)
 Specialty Medication(s): Stelara    Diane Pugh has been dis-enrolled from the [facility] specialty pharmacy services as a result of  patient to follow up with [employer] regarding [employer] application if they'd like to apply .    Additional information provided to the patient: Left detailed message on [employer]'s phone (primary phone for patient) that M3P exists now and they can sign up for monthly payment plan with her part D to lower costs if interested - please call us back at pharmacy if they have questions or are interested in receiving medicine through her insurance.     Carnella Fryman [facility] Specialty Pharmacist

## 2023-09-21 DIAGNOSIS — M6281 Muscle weakness (generalized): Secondary | ICD-10-CM | POA: Diagnosis not present

## 2023-09-21 DIAGNOSIS — R262 Difficulty in walking, not elsewhere classified: Secondary | ICD-10-CM | POA: Diagnosis not present

## 2023-09-21 DIAGNOSIS — M799 Soft tissue disorder, unspecified: Secondary | ICD-10-CM | POA: Diagnosis not present

## 2023-10-05 ENCOUNTER — Ambulatory Visit: Payer: Medicare HMO | Admitting: Adult Health

## 2023-11-24 DIAGNOSIS — N1831 Chronic kidney disease, stage 3a: Secondary | ICD-10-CM | POA: Diagnosis not present

## 2023-11-24 DIAGNOSIS — M6281 Muscle weakness (generalized): Secondary | ICD-10-CM | POA: Diagnosis not present

## 2023-11-24 DIAGNOSIS — Z1331 Encounter for screening for depression: Secondary | ICD-10-CM | POA: Diagnosis not present

## 2023-11-24 DIAGNOSIS — L409 Psoriasis, unspecified: Secondary | ICD-10-CM | POA: Diagnosis not present

## 2023-11-24 DIAGNOSIS — I129 Hypertensive chronic kidney disease with stage 1 through stage 4 chronic kidney disease, or unspecified chronic kidney disease: Secondary | ICD-10-CM | POA: Diagnosis not present

## 2023-11-24 DIAGNOSIS — M069 Rheumatoid arthritis, unspecified: Secondary | ICD-10-CM | POA: Diagnosis not present

## 2023-11-24 DIAGNOSIS — F4321 Adjustment disorder with depressed mood: Secondary | ICD-10-CM | POA: Diagnosis not present

## 2023-11-24 DIAGNOSIS — R29898 Other symptoms and signs involving the musculoskeletal system: Secondary | ICD-10-CM | POA: Diagnosis not present

## 2023-11-24 DIAGNOSIS — I69354 Hemiplegia and hemiparesis following cerebral infarction affecting left non-dominant side: Secondary | ICD-10-CM | POA: Diagnosis not present

## 2023-11-24 DIAGNOSIS — I10 Essential (primary) hypertension: Secondary | ICD-10-CM | POA: Diagnosis not present

## 2023-11-24 DIAGNOSIS — R300 Dysuria: Secondary | ICD-10-CM | POA: Diagnosis not present

## 2023-11-24 DIAGNOSIS — Z0000 Encounter for general adult medical examination without abnormal findings: Secondary | ICD-10-CM | POA: Diagnosis not present

## 2023-11-24 DIAGNOSIS — I679 Cerebrovascular disease, unspecified: Secondary | ICD-10-CM | POA: Diagnosis not present

## 2023-12-06 DIAGNOSIS — I69398 Other sequelae of cerebral infarction: Secondary | ICD-10-CM | POA: Diagnosis not present

## 2023-12-06 DIAGNOSIS — M19042 Primary osteoarthritis, left hand: Secondary | ICD-10-CM | POA: Diagnosis not present

## 2023-12-06 DIAGNOSIS — N1831 Chronic kidney disease, stage 3a: Secondary | ICD-10-CM | POA: Diagnosis not present

## 2023-12-06 DIAGNOSIS — M19041 Primary osteoarthritis, right hand: Secondary | ICD-10-CM | POA: Diagnosis not present

## 2023-12-06 DIAGNOSIS — I129 Hypertensive chronic kidney disease with stage 1 through stage 4 chronic kidney disease, or unspecified chronic kidney disease: Secondary | ICD-10-CM | POA: Diagnosis not present

## 2023-12-06 DIAGNOSIS — I69354 Hemiplegia and hemiparesis following cerebral infarction affecting left non-dominant side: Secondary | ICD-10-CM | POA: Diagnosis not present

## 2023-12-06 DIAGNOSIS — F4321 Adjustment disorder with depressed mood: Secondary | ICD-10-CM | POA: Diagnosis not present

## 2023-12-06 DIAGNOSIS — R2689 Other abnormalities of gait and mobility: Secondary | ICD-10-CM | POA: Diagnosis not present

## 2023-12-06 DIAGNOSIS — I2510 Atherosclerotic heart disease of native coronary artery without angina pectoris: Secondary | ICD-10-CM | POA: Diagnosis not present

## 2023-12-09 DIAGNOSIS — M19042 Primary osteoarthritis, left hand: Secondary | ICD-10-CM | POA: Diagnosis not present

## 2023-12-09 DIAGNOSIS — F4321 Adjustment disorder with depressed mood: Secondary | ICD-10-CM | POA: Diagnosis not present

## 2023-12-09 DIAGNOSIS — I69354 Hemiplegia and hemiparesis following cerebral infarction affecting left non-dominant side: Secondary | ICD-10-CM | POA: Diagnosis not present

## 2023-12-09 DIAGNOSIS — N1831 Chronic kidney disease, stage 3a: Secondary | ICD-10-CM | POA: Diagnosis not present

## 2023-12-09 DIAGNOSIS — I69398 Other sequelae of cerebral infarction: Secondary | ICD-10-CM | POA: Diagnosis not present

## 2023-12-09 DIAGNOSIS — M19041 Primary osteoarthritis, right hand: Secondary | ICD-10-CM | POA: Diagnosis not present

## 2023-12-09 DIAGNOSIS — I2510 Atherosclerotic heart disease of native coronary artery without angina pectoris: Secondary | ICD-10-CM | POA: Diagnosis not present

## 2023-12-09 DIAGNOSIS — R2689 Other abnormalities of gait and mobility: Secondary | ICD-10-CM | POA: Diagnosis not present

## 2023-12-09 DIAGNOSIS — I129 Hypertensive chronic kidney disease with stage 1 through stage 4 chronic kidney disease, or unspecified chronic kidney disease: Secondary | ICD-10-CM | POA: Diagnosis not present

## 2023-12-12 DIAGNOSIS — N1831 Chronic kidney disease, stage 3a: Secondary | ICD-10-CM | POA: Diagnosis not present

## 2023-12-12 DIAGNOSIS — I2510 Atherosclerotic heart disease of native coronary artery without angina pectoris: Secondary | ICD-10-CM | POA: Diagnosis not present

## 2023-12-12 DIAGNOSIS — M19041 Primary osteoarthritis, right hand: Secondary | ICD-10-CM | POA: Diagnosis not present

## 2023-12-12 DIAGNOSIS — I69354 Hemiplegia and hemiparesis following cerebral infarction affecting left non-dominant side: Secondary | ICD-10-CM | POA: Diagnosis not present

## 2023-12-12 DIAGNOSIS — I69398 Other sequelae of cerebral infarction: Secondary | ICD-10-CM | POA: Diagnosis not present

## 2023-12-12 DIAGNOSIS — I129 Hypertensive chronic kidney disease with stage 1 through stage 4 chronic kidney disease, or unspecified chronic kidney disease: Secondary | ICD-10-CM | POA: Diagnosis not present

## 2023-12-12 DIAGNOSIS — F4321 Adjustment disorder with depressed mood: Secondary | ICD-10-CM | POA: Diagnosis not present

## 2023-12-12 DIAGNOSIS — R2689 Other abnormalities of gait and mobility: Secondary | ICD-10-CM | POA: Diagnosis not present

## 2023-12-12 DIAGNOSIS — M19042 Primary osteoarthritis, left hand: Secondary | ICD-10-CM | POA: Diagnosis not present

## 2023-12-14 DIAGNOSIS — R2689 Other abnormalities of gait and mobility: Secondary | ICD-10-CM | POA: Diagnosis not present

## 2023-12-14 DIAGNOSIS — I69354 Hemiplegia and hemiparesis following cerebral infarction affecting left non-dominant side: Secondary | ICD-10-CM | POA: Diagnosis not present

## 2023-12-14 DIAGNOSIS — I129 Hypertensive chronic kidney disease with stage 1 through stage 4 chronic kidney disease, or unspecified chronic kidney disease: Secondary | ICD-10-CM | POA: Diagnosis not present

## 2023-12-14 DIAGNOSIS — F4321 Adjustment disorder with depressed mood: Secondary | ICD-10-CM | POA: Diagnosis not present

## 2023-12-14 DIAGNOSIS — N1831 Chronic kidney disease, stage 3a: Secondary | ICD-10-CM | POA: Diagnosis not present

## 2023-12-14 DIAGNOSIS — I69398 Other sequelae of cerebral infarction: Secondary | ICD-10-CM | POA: Diagnosis not present

## 2023-12-14 DIAGNOSIS — I2510 Atherosclerotic heart disease of native coronary artery without angina pectoris: Secondary | ICD-10-CM | POA: Diagnosis not present

## 2023-12-14 DIAGNOSIS — M19041 Primary osteoarthritis, right hand: Secondary | ICD-10-CM | POA: Diagnosis not present

## 2023-12-14 DIAGNOSIS — M19042 Primary osteoarthritis, left hand: Secondary | ICD-10-CM | POA: Diagnosis not present

## 2023-12-15 DIAGNOSIS — R300 Dysuria: Secondary | ICD-10-CM | POA: Diagnosis not present

## 2023-12-17 DIAGNOSIS — F4321 Adjustment disorder with depressed mood: Secondary | ICD-10-CM | POA: Diagnosis not present

## 2023-12-17 DIAGNOSIS — I69354 Hemiplegia and hemiparesis following cerebral infarction affecting left non-dominant side: Secondary | ICD-10-CM | POA: Diagnosis not present

## 2023-12-17 DIAGNOSIS — M19041 Primary osteoarthritis, right hand: Secondary | ICD-10-CM | POA: Diagnosis not present

## 2023-12-17 DIAGNOSIS — M19042 Primary osteoarthritis, left hand: Secondary | ICD-10-CM | POA: Diagnosis not present

## 2023-12-17 DIAGNOSIS — R2689 Other abnormalities of gait and mobility: Secondary | ICD-10-CM | POA: Diagnosis not present

## 2023-12-17 DIAGNOSIS — I129 Hypertensive chronic kidney disease with stage 1 through stage 4 chronic kidney disease, or unspecified chronic kidney disease: Secondary | ICD-10-CM | POA: Diagnosis not present

## 2023-12-17 DIAGNOSIS — I2510 Atherosclerotic heart disease of native coronary artery without angina pectoris: Secondary | ICD-10-CM | POA: Diagnosis not present

## 2023-12-17 DIAGNOSIS — I69398 Other sequelae of cerebral infarction: Secondary | ICD-10-CM | POA: Diagnosis not present

## 2023-12-17 DIAGNOSIS — N1831 Chronic kidney disease, stage 3a: Secondary | ICD-10-CM | POA: Diagnosis not present

## 2023-12-19 DIAGNOSIS — R2689 Other abnormalities of gait and mobility: Secondary | ICD-10-CM | POA: Diagnosis not present

## 2023-12-19 DIAGNOSIS — I2510 Atherosclerotic heart disease of native coronary artery without angina pectoris: Secondary | ICD-10-CM | POA: Diagnosis not present

## 2023-12-19 DIAGNOSIS — M19041 Primary osteoarthritis, right hand: Secondary | ICD-10-CM | POA: Diagnosis not present

## 2023-12-19 DIAGNOSIS — I69398 Other sequelae of cerebral infarction: Secondary | ICD-10-CM | POA: Diagnosis not present

## 2023-12-19 DIAGNOSIS — I69354 Hemiplegia and hemiparesis following cerebral infarction affecting left non-dominant side: Secondary | ICD-10-CM | POA: Diagnosis not present

## 2023-12-19 DIAGNOSIS — I129 Hypertensive chronic kidney disease with stage 1 through stage 4 chronic kidney disease, or unspecified chronic kidney disease: Secondary | ICD-10-CM | POA: Diagnosis not present

## 2023-12-19 DIAGNOSIS — F4321 Adjustment disorder with depressed mood: Secondary | ICD-10-CM | POA: Diagnosis not present

## 2023-12-19 DIAGNOSIS — N1831 Chronic kidney disease, stage 3a: Secondary | ICD-10-CM | POA: Diagnosis not present

## 2023-12-19 DIAGNOSIS — M19042 Primary osteoarthritis, left hand: Secondary | ICD-10-CM | POA: Diagnosis not present

## 2023-12-20 DIAGNOSIS — M19042 Primary osteoarthritis, left hand: Secondary | ICD-10-CM | POA: Diagnosis not present

## 2023-12-20 DIAGNOSIS — I129 Hypertensive chronic kidney disease with stage 1 through stage 4 chronic kidney disease, or unspecified chronic kidney disease: Secondary | ICD-10-CM | POA: Diagnosis not present

## 2023-12-20 DIAGNOSIS — M19041 Primary osteoarthritis, right hand: Secondary | ICD-10-CM | POA: Diagnosis not present

## 2023-12-20 DIAGNOSIS — R2689 Other abnormalities of gait and mobility: Secondary | ICD-10-CM | POA: Diagnosis not present

## 2023-12-20 DIAGNOSIS — I69354 Hemiplegia and hemiparesis following cerebral infarction affecting left non-dominant side: Secondary | ICD-10-CM | POA: Diagnosis not present

## 2023-12-20 DIAGNOSIS — N1831 Chronic kidney disease, stage 3a: Secondary | ICD-10-CM | POA: Diagnosis not present

## 2023-12-20 DIAGNOSIS — I2510 Atherosclerotic heart disease of native coronary artery without angina pectoris: Secondary | ICD-10-CM | POA: Diagnosis not present

## 2023-12-20 DIAGNOSIS — F4321 Adjustment disorder with depressed mood: Secondary | ICD-10-CM | POA: Diagnosis not present

## 2023-12-20 DIAGNOSIS — I69398 Other sequelae of cerebral infarction: Secondary | ICD-10-CM | POA: Diagnosis not present

## 2023-12-23 DIAGNOSIS — M19041 Primary osteoarthritis, right hand: Secondary | ICD-10-CM | POA: Diagnosis not present

## 2023-12-23 DIAGNOSIS — I129 Hypertensive chronic kidney disease with stage 1 through stage 4 chronic kidney disease, or unspecified chronic kidney disease: Secondary | ICD-10-CM | POA: Diagnosis not present

## 2023-12-23 DIAGNOSIS — I69354 Hemiplegia and hemiparesis following cerebral infarction affecting left non-dominant side: Secondary | ICD-10-CM | POA: Diagnosis not present

## 2023-12-23 DIAGNOSIS — I2510 Atherosclerotic heart disease of native coronary artery without angina pectoris: Secondary | ICD-10-CM | POA: Diagnosis not present

## 2023-12-23 DIAGNOSIS — I69398 Other sequelae of cerebral infarction: Secondary | ICD-10-CM | POA: Diagnosis not present

## 2023-12-23 DIAGNOSIS — N1831 Chronic kidney disease, stage 3a: Secondary | ICD-10-CM | POA: Diagnosis not present

## 2023-12-23 DIAGNOSIS — R2689 Other abnormalities of gait and mobility: Secondary | ICD-10-CM | POA: Diagnosis not present

## 2023-12-23 DIAGNOSIS — M19042 Primary osteoarthritis, left hand: Secondary | ICD-10-CM | POA: Diagnosis not present

## 2023-12-23 DIAGNOSIS — F4321 Adjustment disorder with depressed mood: Secondary | ICD-10-CM | POA: Diagnosis not present

## 2023-12-26 DIAGNOSIS — I129 Hypertensive chronic kidney disease with stage 1 through stage 4 chronic kidney disease, or unspecified chronic kidney disease: Secondary | ICD-10-CM | POA: Diagnosis not present

## 2023-12-26 DIAGNOSIS — M19042 Primary osteoarthritis, left hand: Secondary | ICD-10-CM | POA: Diagnosis not present

## 2023-12-26 DIAGNOSIS — F4321 Adjustment disorder with depressed mood: Secondary | ICD-10-CM | POA: Diagnosis not present

## 2023-12-26 DIAGNOSIS — R2689 Other abnormalities of gait and mobility: Secondary | ICD-10-CM | POA: Diagnosis not present

## 2023-12-26 DIAGNOSIS — N1831 Chronic kidney disease, stage 3a: Secondary | ICD-10-CM | POA: Diagnosis not present

## 2023-12-26 DIAGNOSIS — I69398 Other sequelae of cerebral infarction: Secondary | ICD-10-CM | POA: Diagnosis not present

## 2023-12-26 DIAGNOSIS — I69354 Hemiplegia and hemiparesis following cerebral infarction affecting left non-dominant side: Secondary | ICD-10-CM | POA: Diagnosis not present

## 2023-12-26 DIAGNOSIS — M19041 Primary osteoarthritis, right hand: Secondary | ICD-10-CM | POA: Diagnosis not present

## 2023-12-26 DIAGNOSIS — I2510 Atherosclerotic heart disease of native coronary artery without angina pectoris: Secondary | ICD-10-CM | POA: Diagnosis not present

## 2023-12-27 DIAGNOSIS — I69354 Hemiplegia and hemiparesis following cerebral infarction affecting left non-dominant side: Secondary | ICD-10-CM | POA: Diagnosis not present

## 2023-12-27 DIAGNOSIS — M19041 Primary osteoarthritis, right hand: Secondary | ICD-10-CM | POA: Diagnosis not present

## 2023-12-27 DIAGNOSIS — N1831 Chronic kidney disease, stage 3a: Secondary | ICD-10-CM | POA: Diagnosis not present

## 2023-12-27 DIAGNOSIS — I69398 Other sequelae of cerebral infarction: Secondary | ICD-10-CM | POA: Diagnosis not present

## 2023-12-27 DIAGNOSIS — I129 Hypertensive chronic kidney disease with stage 1 through stage 4 chronic kidney disease, or unspecified chronic kidney disease: Secondary | ICD-10-CM | POA: Diagnosis not present

## 2023-12-27 DIAGNOSIS — I2510 Atherosclerotic heart disease of native coronary artery without angina pectoris: Secondary | ICD-10-CM | POA: Diagnosis not present

## 2023-12-27 DIAGNOSIS — F4321 Adjustment disorder with depressed mood: Secondary | ICD-10-CM | POA: Diagnosis not present

## 2023-12-27 DIAGNOSIS — R2689 Other abnormalities of gait and mobility: Secondary | ICD-10-CM | POA: Diagnosis not present

## 2023-12-27 DIAGNOSIS — M19042 Primary osteoarthritis, left hand: Secondary | ICD-10-CM | POA: Diagnosis not present

## 2023-12-28 MED ORDER — EMPTY CONTAINER
2 refills | 0.00000 days
Start: 2023-12-28 — End: ?

## 2023-12-28 NOTE — Unmapped (Addendum)
 [facility] Specialty Medication Onboarding    Specialty Medication: Skyrizi   Prior Authorization: Approved   Financial Assistance: No - patient declined financial assistance per [employer] referral  Final Copay/Day Supply: $1868.36 / 28 days (LD)  & 84 days (MD)    Insurance Restrictions: Yes - max 1 month supply  LD only    Notes to Pharmacist: Pt is enrolled with M3P - co-pay should be $0 once applied  Credit Card on File: not applicable  Start Date on Rx:      The triage team has completed the benefits investigation and has determined that the patient is able to fill this medication at [facility]. Please contact the patient to complete the onboarding or follow up with the prescribing physician as needed.

## 2023-12-28 NOTE — Unmapped (Signed)
 [facility]    Patient Onboarding/Medication Counseling    Diane Pugh is a 78 y.o. female with psoriasis who I am counseling today on initiation of therapy.  I am speaking to the patient's family member, daughter, [name].    Was a Nurse, learning disability used for this call? No    Verified patient's date of birth / HIPAA.    Specialty medication(s) to be sent: Inflammatory Disorders: Skyrizi       Non-specialty medications/supplies to be sent: sharps kit      Medications not needed at this time: na         Skyrizi  (risankizumab )    Medication & Administration     Dosage: Plaque psoriasis: Inject 150mg  under the skin at weeks 0 and 4, then every 12 weeks thereafter    Lab tests required prior to treatment initiation:  Tuberculosis: Tuberculosis screening resulted in a non-reactive Quantiferon TB Gold assay.    Administration:     Artist all supplies needed for injection on a clean, flat working surface: medication syringe(s) removed from packaging, alcohol swab, sharps container, etc.  Look at the medication label - look for correct medication, correct dose, and check the expiration date  Look at the medication - the liquid in the syringe should appear clear and colorless to slightly yellow, you may see tiny white or clear particles  Lay the syringe on a flat surface and allow it to warm up to room temperature for at least 30 minutes  Select injection site - you can use the front of your thigh or your belly (but not the area 2 inches around your belly button); if someone else is giving you the injection you can also use your upper arm in the skin covering your triceps muscle  Prepare injection site - wash your hands and clean the skin at the injection site with an alcohol swab and let it air dry, do not touch the injection site again before the injection  Pull off the needle safety cap, do not remove until immediately prior to injection; turn the syringe so the needle is facing up and hold the syringe at eye level with one hand so you can see the air in the syringe; using your other hand, slowly push the plunger in to push the air out through the needle  Pinch the skin - with your hand not holding the syringe pinch up a fold of skin at the injection site using your forefinger and thumb  Insert the needle into the fold of skin at about a 45 degree angle - it's best to use a quick dart-like motion  Push the plunger down slowly as far as it will go until the syringe is empty, if the plunger is not fully depressed the needle shield will not extend to cover the needle when it is removed, hold the syringe in place for a full 5 seconds  Check that the syringe is empty and keep pressing down on the plunger while you pull the needle out at the same angle as inserted; after the needle is removed completely from the skin, release the plunger allowing the needle shield to activate and cover the used needle  Dispose of the used syringe immediately in your sharps disposal container, do not attempt to recap the needle prior to disposing  If you see any blood at the injection site, press a cotton ball or gauze on the site and maintain pressure until the bleeding stops, do not  rub the injection site    Pen:  Gather all supplies needed for injection on a clean, flat working surface: medication pen removed from packaging, alcohol swab, sharps container, etc.  Look at the medication label - look for correct medication, correct dose, and check the expiration date  Look at the medication through the medication inspection window - the liquid in the pen should appear clear and colorless to slightly yellow, you may see tiny white or clear particles  Lay the pen on a flat surface and allow it to warm up to room temperature for at least 30 minutes  Select injection site - you can use the front of your thigh or your belly (but not the area 2 inches around your belly button); if someone else is giving you the injection you can also use your upper arm in the skin covering your triceps muscle  Prepare injection site - wash your hands and clean the skin at the injection site with an alcohol swab and let it air dry, do not touch the injection site again before the injection  Hold the pen with your fingers on the gray grips so you can see the green activator button (it is on the side of the pen, not the top).  Pinch the skin - with your hand not holding the pen, pinch up a fold of skin at the injection site using your forefinger and thumb  Press the white needle sleeve of the pen down against the pinched skin, then press the green activator button.  You will hear a click, meaning the injection has started.  Note - the pen only activates if the white needle sleeve is pressed down before pressing the green activator button.  Continue holding the pen against your skin for 15 sections, until you hear a second click, or until the yellow indicator has filled the medication inspection window.  The injection is now complete.  Slowly pull the pen straight out from your skin and dispose of the used pen immediately in your sharps disposal container.   If you see any blood at the injection site, press a cotton ball or gauze on the site and maintain pressure until the bleeding stops, do not rub the injection site    On-Body Injector  Gather all supplies needed for injection on a clean, flat working surface: Plastic tray containing the On-body injector and prefilled cartridge, alcohol swab, sharps container, etc.  Remove unopened carton from the refrigerator and allow it to warm up to room temperature for at least 45 but no more than 90 minutes.  Look at the medication label - look for correct medication, correct dose, and check the expiration date  Remove the On-body injector and prefilled cartridge from plastic tray  Look at the On-body injector - check that it is intact and undamaged. Do NOT close the gray door before the prefilled cartridge is loaded  Look at the prefilled cartridge - the liquid should appear clear and colorless to slightly yellow, you may see tiny white or clear particles. Do NOT twist or remove cartridge top.  Use an alcohol swab to clean the smaller bottom tip of the prefilled cartridge and let it air dry. Do not touch the smaller bottom tip after cleaning.  Insert the smaller bottom tip into the On-Body injector first. Firmly push down until you hear a click. There may be a few drops of medicine on the back of the On-body Injector. That is normal. Close the gray door and  squeeze firmly until it snaps closed.   You MUST start the injection within 5 minutes of loading the prefilled cartridge.   Select injection site - you can use the front of your thigh or your belly (but not the area 2 inches around your belly button)  Prepare injection site - wash your hands and clean the skin at the injection site with an alcohol swab and let it air dry, do not touch the injection site again before the injection  Peel the green tabs on the back of the On-body injector to expose the adhesive. This will activate the On-body injector and cause the status light to turn BLUE.   For the belly, move and hold the skin to create a firm, flat surface. You do not need to pull the skin if injecting on the front of the thighs.  When the blue light flashes, it is ready to start the injection.  Place the On-body injector onto the cleaned skin and then firmly press the gray button until you hear a click. This will start the injection and the light will continuously flash GREEN. This may take up to 5 minutes to complete the full dose.  The On-body Injector will automatically stop when the injection is finished. You will hear beeps and the status light will change to SOLID GREEN.   Remove the On-Body Injector by carefully peeling the adhesive from your skin. Avoid touching the needle or needle cover on the back of the On-Body Injector. You will hear several beeps and the status light will turn off.   Dispose of the used On-Body Injector immediately in Engineer, water.      Adherence/Missed dose instructions:  If your injection is given more than 7 days after your scheduled injection date - consult your pharmacist for additional instructions on how to adjust your dosing schedule.    Goals of Therapy     Plaque Psoriasis  Minimize areas of skin involvement (% BSA)  Avoidance of long term glucocorticoid use  Maintenance of effective psychosocial functioning        Side Effects & Monitoring Parameters     Injection site reaction (redness, irritation, inflammation localized to the site of administration)  Signs of a common cold - minor sore throat, runny or stuffy nose, etc.  Felling tired/weak  Headache  Stomach, joint or back pain    The following side effects should be reported to the provider:  Signs of a hypersensitivity reaction - rash; hives; itching; red, swollen, blistered, or peeling skin; wheezing; tightness in the chest or throat; difficulty breathing, swallowing, or talking; swelling of the mouth, face, lips, tongue, or throat; etc.  Reduced immune function - report signs of infection such as fever; chills; body aches; very bad sore throat; ear or sinus pain; cough; more sputum or change in color of sputum; pain with passing urine; wound that will not heal, etc.  Also at a slightly higher risk of some malignancies (mainly skin and blood cancers) due to this reduced immune function.  In the case of signs of infection - the patient should hold the next dose of Skyrizi ?? and call your primary care provider to ensure adequate medical care.  Treatment may be resumed when infection is treated and patient is asymptomatic.  Flu-like symptoms  Warm, red, or painful skin or sores on the body  Severe diarrhea or stomach pain      Contraindications, Warnings, & Precautions     Have your bloodwork checked as  you have been told by your prescriber  Talk with your doctor if you are pregnant, planning to become pregnant, or breastfeeding  Discuss the possible need for holding your dose(s) of Skyrizi ?? when a planned procedure is scheduled with the prescriber as it may delay healing/recovery timeline       Drug/Food Interactions     Medication list reviewed in Epic. The patient was instructed to inform the care team before taking any new medications or supplements. No drug interactions identified.   Talk with you prescriber or pharmacist before receiving any live vaccinations while taking this medication and after you stop taking it    Storage, Handling Precautions, & Disposal     Store this medication in the refrigerator.  Do not freeze   If needed, you may store at room temperature for up to 24 hours  Store in original packaging, protected from light  Do not shake  Dispose of used syringes/pens in a sharps disposal container          Current Medications (including OTC/herbals), Comorbidities and Allergies     Current Medications[1]    Allergies[2]    Problem List[3]    Medication list has been reviewed and updated in Epic: Yes    Allergies have been reviewed and updated in Epic: Yes    Appropriateness of Therapy     Acute infections noted within Epic:  No active infections  Patient reported infection: None    Is the medication and dose appropriate based on diagnosis, medication list, comorbidities, allergies, medical history, patient???s ability to self-administer the medication, and therapeutic goals? Yes    Prescription has been clinically reviewed: Yes      Baseline Quality of Life Assessment      How many days over the past month did your psoriasis  keep you from your normal activities? For example, brushing your teeth or getting up in the morning. Daily - has severe disease impacting QOL and social activities.    Financial Information     Medication Assistance provided: Prior Authorization    Anticipated copay of $0 with M3P reviewed with patient. Verified delivery address.    Delivery Information     Scheduled delivery date: 7/8    Expected start date: 7/8      Medication will be delivered via UPS to the prescription address in [facility].  This shipment will not require a signature.      Explained the services we provide at [facility] and that each month we would call to set up refills.  Stressed importance of returning phone calls so that we could ensure they receive their medications in time each month.  Informed patient that we should be setting up refills 7-10 days prior to when they will run out of medication.  A pharmacist will reach out to perform a clinical assessment periodically.  Informed patient that a welcome packet, containing information about our pharmacy and other support services, a Notice of Privacy Practices, and a drug information handout will be sent.      The patient or caregiver noted above participated in the development of this care plan and knows that they can request review of or adjustments to the care plan at any time.      Patient or caregiver verbalized understanding of the above information as well as how to contact the pharmacy at [phone] option 4 with any questions/concerns.  The pharmacy is open Monday through Friday 8:30am-4:30pm.  A pharmacist is  available 24/7 via pager to answer any clinical questions they may have.    Patient Specific Needs     Does the patient have any physical, cognitive, or cultural barriers? No    Does the patient have adequate living arrangements? (i.e. the ability to store and take their medication appropriately) Yes    Did you identify any home environmental safety or security hazards? No    Patient prefers to have medications discussed with  Patient     Is the patient or caregiver able to read and understand education materials at a high school level or above? Yes    Patient's primary language is  English     Is the patient high risk? No    Does the patient have an additional or emergency contact listed in their chart? Yes    SOCIAL DETERMINANTS OF HEALTH     At the [facility], we have learned that life circumstances - like trouble affording food, housing, utilities, or transportation can affect the health of many of our patients.   That is why we wanted to ask: are you currently experiencing any life circumstances that are negatively impacting your health and/or quality of life? Patient declined to answer    Social Drivers of Health     Food Insecurity: No Food Insecurity (01/25/2023)    Received from [facility]    Hunger Vital Sign     Worried About Running Out of Food in the Last Year: Never true     Ran Out of Food in the Last Year: Never true   Tobacco Use: High Risk (06/16/2023)    Patient History     Smoking Tobacco Use: Every Day     Smokeless Tobacco Use: Never     Passive Exposure: Current   Transportation Needs: No Transportation Needs (01/25/2023)    Received from [facility] - Transportation     Lack of Transportation (Medical): No     Lack of Transportation (Non-Medical): No   Alcohol Use: Not on file   Housing: Patient Declined (11/23/2022)    Received from [facility]    Housing Stability Vital Sign     Unable to Pay for Housing in the Last Year: Patient declined     Number of Times Moved in the Last Year: Not on file     Homeless in the Last Year: Patient declined   Physical Activity: Unknown (06/09/2017)    Received from [facility]    Exercise Vital Sign     Days of Exercise per Week: Patient declined     Minutes of Exercise per Session: Patient declined   Utilities: Not At Risk (01/25/2023)    Received from [facility] Utilities     Threatened with loss of utilities: No   Stress: No Stress Concern Present (06/09/2017)    Received from [facility] - Occupational Stress Questionnaire     Feeling of Stress : Not at all   Interpersonal Safety: Not on file   Substance Use: Not on file (05/03/2023)   Intimate Partner Violence: Not At Risk (01/25/2023)    Received from [facility]    Humiliation, Afraid, Rape, and Kick questionnaire     Fear of Current or Ex-Partner: No     Emotionally Abused: No     Physically Abused: No     Sexually Abused: No   Social Connections: Unknown (06/09/2017)  Received from [facility]    Social Connection and Isolation Panel     Frequency of Communication with Friends and Family: Patient declined     Frequency of Social Gatherings with Friends and Family: Patient declined     Attends Religious Services: Patient declined     Database administrator or Organizations: Patient declined     Attends Banker Meetings: Patient declined     Marital Status: Patient declined   Programmer, applications: Patient Declined (11/23/2022)    Received from [facility]    Overall Financial Resource Strain (CARDIA)     Difficulty of Paying Living Expenses: Patient declined   Health Literacy: Not on file   Internet Connectivity: Not on file       Would you be willing to receive help with any of the needs that you have identified today? Not applicable       Ruhan Borak [facility] Specialty Pharmacist         [1]   Current Outpatient Medications   Medication Sig Dispense Refill    amlodipine (NORVASC) 10 MG tablet Take 1 tablet (10 mg total) by mouth daily.      aspirin (ECOTRIN) 81 MG tablet Take 1 tablet (81 mg total) by mouth.      atorvastatin (LIPITOR) 40 MG tablet Take 1 tablet (40 mg total) by mouth.      citalopram (CELEXA) 20 MG tablet TAKE 1 TABLET EVERY NIGHT      clobetasol  (TEMOVATE ) 0.05 % external solution Continue to apply twice daily to the scalp for itching 50 mL 5    clopidogrel (PLAVIX) 75 mg tablet TAKE 1 TABLET EVERY DAY      empty container Misc Use as directed to dispose of Skyrizi  pens. 1 each 2    folic acid  (FOLVITE ) 1 MG tablet TAKE 1 -2 MG 6 DAYS A WEEK (Patient not taking: Reported on 06/16/2023) 100 tablet 3    hydroCHLOROthiazide (MICROZIDE) 12.5 mg capsule Take 1 capsule (12.5 mg total) by mouth.      ibuprofen (ADVIL,MOTRIN) 800 MG tablet Take 1 tablet (800 mg total) by mouth.      lisinopril (PRINIVIL,ZESTRIL) 20 MG tablet TAKE 1 TABLET EVERY DAY      melatonin 5 mg TbDL       potassium 99 mg Tab Take 0.5 tablets by mouth. (Patient not taking: Reported on 06/16/2023)      pregabalin (LYRICA) 100 MG capsule Take 1 capsule (100 mg total) by mouth. (Patient not taking: Reported on 06/16/2023)      risankizumab -rzaa (SKYRIZI ) 150 mg/mL PnIj Inject  the contents of 1 pen (150 mg) into the skin at weeks 0, 4 and then every 12 weeks thereafter 2 mL 0    risankizumab -rzaa (SKYRIZI ) 150 mg/mL PnIj MAINTENANCE SUBQ: inject the contents of 1 pen (150mg ) into the skin every 12 weeks. 1 mL 3    traMADol (ULTRAM) 50 mg tablet Take 1 tablet (50 mg total) by mouth two (2) times a day as needed.      traZODone (DESYREL) 50 MG tablet Take 1 tablet (50 mg total) by mouth nightly.      triamcinolone  (KENALOG ) 0.1 % ointment Apply to affected area twice daily until improved or for up to 2 weeks, whichever is sooner. Break for 1-2 weeks. Restart as needed. 454 g 0     No current facility-administered medications for this visit.   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

## 2023-12-29 DIAGNOSIS — R2689 Other abnormalities of gait and mobility: Secondary | ICD-10-CM | POA: Diagnosis not present

## 2023-12-29 DIAGNOSIS — F4321 Adjustment disorder with depressed mood: Secondary | ICD-10-CM | POA: Diagnosis not present

## 2023-12-29 DIAGNOSIS — N1831 Chronic kidney disease, stage 3a: Secondary | ICD-10-CM | POA: Diagnosis not present

## 2023-12-29 DIAGNOSIS — M19042 Primary osteoarthritis, left hand: Secondary | ICD-10-CM | POA: Diagnosis not present

## 2023-12-29 DIAGNOSIS — I69354 Hemiplegia and hemiparesis following cerebral infarction affecting left non-dominant side: Secondary | ICD-10-CM | POA: Diagnosis not present

## 2023-12-29 DIAGNOSIS — I129 Hypertensive chronic kidney disease with stage 1 through stage 4 chronic kidney disease, or unspecified chronic kidney disease: Secondary | ICD-10-CM | POA: Diagnosis not present

## 2023-12-29 DIAGNOSIS — M19041 Primary osteoarthritis, right hand: Secondary | ICD-10-CM | POA: Diagnosis not present

## 2023-12-29 DIAGNOSIS — I69398 Other sequelae of cerebral infarction: Secondary | ICD-10-CM | POA: Diagnosis not present

## 2023-12-29 DIAGNOSIS — I2510 Atherosclerotic heart disease of native coronary artery without angina pectoris: Secondary | ICD-10-CM | POA: Diagnosis not present

## 2024-01-02 DIAGNOSIS — I129 Hypertensive chronic kidney disease with stage 1 through stage 4 chronic kidney disease, or unspecified chronic kidney disease: Secondary | ICD-10-CM | POA: Diagnosis not present

## 2024-01-02 DIAGNOSIS — F4321 Adjustment disorder with depressed mood: Secondary | ICD-10-CM | POA: Diagnosis not present

## 2024-01-02 DIAGNOSIS — M19042 Primary osteoarthritis, left hand: Secondary | ICD-10-CM | POA: Diagnosis not present

## 2024-01-02 DIAGNOSIS — I2510 Atherosclerotic heart disease of native coronary artery without angina pectoris: Secondary | ICD-10-CM | POA: Diagnosis not present

## 2024-01-02 DIAGNOSIS — I69398 Other sequelae of cerebral infarction: Secondary | ICD-10-CM | POA: Diagnosis not present

## 2024-01-02 DIAGNOSIS — N1831 Chronic kidney disease, stage 3a: Secondary | ICD-10-CM | POA: Diagnosis not present

## 2024-01-02 DIAGNOSIS — I69354 Hemiplegia and hemiparesis following cerebral infarction affecting left non-dominant side: Secondary | ICD-10-CM | POA: Diagnosis not present

## 2024-01-02 DIAGNOSIS — M19041 Primary osteoarthritis, right hand: Secondary | ICD-10-CM | POA: Diagnosis not present

## 2024-01-02 DIAGNOSIS — R2689 Other abnormalities of gait and mobility: Secondary | ICD-10-CM | POA: Diagnosis not present

## 2024-01-02 MED FILL — EMPTY CONTAINER: 120 days supply | Qty: 1 | Fill #0

## 2024-01-02 MED FILL — SKYRIZI 150 MG/ML SUBCUTANEOUS PEN INJECTOR: SUBCUTANEOUS | 28 days supply | Qty: 1 | Fill #0

## 2024-01-04 DIAGNOSIS — I2510 Atherosclerotic heart disease of native coronary artery without angina pectoris: Secondary | ICD-10-CM | POA: Diagnosis not present

## 2024-01-04 DIAGNOSIS — M19041 Primary osteoarthritis, right hand: Secondary | ICD-10-CM | POA: Diagnosis not present

## 2024-01-04 DIAGNOSIS — F4321 Adjustment disorder with depressed mood: Secondary | ICD-10-CM | POA: Diagnosis not present

## 2024-01-04 DIAGNOSIS — I129 Hypertensive chronic kidney disease with stage 1 through stage 4 chronic kidney disease, or unspecified chronic kidney disease: Secondary | ICD-10-CM | POA: Diagnosis not present

## 2024-01-04 DIAGNOSIS — I69398 Other sequelae of cerebral infarction: Secondary | ICD-10-CM | POA: Diagnosis not present

## 2024-01-04 DIAGNOSIS — N1831 Chronic kidney disease, stage 3a: Secondary | ICD-10-CM | POA: Diagnosis not present

## 2024-01-04 DIAGNOSIS — M19042 Primary osteoarthritis, left hand: Secondary | ICD-10-CM | POA: Diagnosis not present

## 2024-01-04 DIAGNOSIS — I69354 Hemiplegia and hemiparesis following cerebral infarction affecting left non-dominant side: Secondary | ICD-10-CM | POA: Diagnosis not present

## 2024-01-04 DIAGNOSIS — R2689 Other abnormalities of gait and mobility: Secondary | ICD-10-CM | POA: Diagnosis not present

## 2024-01-05 DIAGNOSIS — F4321 Adjustment disorder with depressed mood: Secondary | ICD-10-CM | POA: Diagnosis not present

## 2024-01-05 DIAGNOSIS — I129 Hypertensive chronic kidney disease with stage 1 through stage 4 chronic kidney disease, or unspecified chronic kidney disease: Secondary | ICD-10-CM | POA: Diagnosis not present

## 2024-01-05 DIAGNOSIS — M19042 Primary osteoarthritis, left hand: Secondary | ICD-10-CM | POA: Diagnosis not present

## 2024-01-05 DIAGNOSIS — I2510 Atherosclerotic heart disease of native coronary artery without angina pectoris: Secondary | ICD-10-CM | POA: Diagnosis not present

## 2024-01-05 DIAGNOSIS — R2689 Other abnormalities of gait and mobility: Secondary | ICD-10-CM | POA: Diagnosis not present

## 2024-01-05 DIAGNOSIS — I69398 Other sequelae of cerebral infarction: Secondary | ICD-10-CM | POA: Diagnosis not present

## 2024-01-05 DIAGNOSIS — I69354 Hemiplegia and hemiparesis following cerebral infarction affecting left non-dominant side: Secondary | ICD-10-CM | POA: Diagnosis not present

## 2024-01-05 DIAGNOSIS — M19041 Primary osteoarthritis, right hand: Secondary | ICD-10-CM | POA: Diagnosis not present

## 2024-01-05 DIAGNOSIS — N1831 Chronic kidney disease, stage 3a: Secondary | ICD-10-CM | POA: Diagnosis not present

## 2024-01-09 DIAGNOSIS — R2689 Other abnormalities of gait and mobility: Secondary | ICD-10-CM | POA: Diagnosis not present

## 2024-01-09 DIAGNOSIS — N1831 Chronic kidney disease, stage 3a: Secondary | ICD-10-CM | POA: Diagnosis not present

## 2024-01-09 DIAGNOSIS — I129 Hypertensive chronic kidney disease with stage 1 through stage 4 chronic kidney disease, or unspecified chronic kidney disease: Secondary | ICD-10-CM | POA: Diagnosis not present

## 2024-01-09 DIAGNOSIS — I69354 Hemiplegia and hemiparesis following cerebral infarction affecting left non-dominant side: Secondary | ICD-10-CM | POA: Diagnosis not present

## 2024-01-09 DIAGNOSIS — I2510 Atherosclerotic heart disease of native coronary artery without angina pectoris: Secondary | ICD-10-CM | POA: Diagnosis not present

## 2024-01-09 DIAGNOSIS — I69398 Other sequelae of cerebral infarction: Secondary | ICD-10-CM | POA: Diagnosis not present

## 2024-01-09 DIAGNOSIS — F4321 Adjustment disorder with depressed mood: Secondary | ICD-10-CM | POA: Diagnosis not present

## 2024-01-09 DIAGNOSIS — M19041 Primary osteoarthritis, right hand: Secondary | ICD-10-CM | POA: Diagnosis not present

## 2024-01-09 DIAGNOSIS — M19042 Primary osteoarthritis, left hand: Secondary | ICD-10-CM | POA: Diagnosis not present

## 2024-01-10 DIAGNOSIS — N1831 Chronic kidney disease, stage 3a: Secondary | ICD-10-CM | POA: Diagnosis not present

## 2024-01-10 DIAGNOSIS — M19041 Primary osteoarthritis, right hand: Secondary | ICD-10-CM | POA: Diagnosis not present

## 2024-01-10 DIAGNOSIS — I69354 Hemiplegia and hemiparesis following cerebral infarction affecting left non-dominant side: Secondary | ICD-10-CM | POA: Diagnosis not present

## 2024-01-10 DIAGNOSIS — I129 Hypertensive chronic kidney disease with stage 1 through stage 4 chronic kidney disease, or unspecified chronic kidney disease: Secondary | ICD-10-CM | POA: Diagnosis not present

## 2024-01-10 DIAGNOSIS — R2689 Other abnormalities of gait and mobility: Secondary | ICD-10-CM | POA: Diagnosis not present

## 2024-01-10 DIAGNOSIS — F4321 Adjustment disorder with depressed mood: Secondary | ICD-10-CM | POA: Diagnosis not present

## 2024-01-10 DIAGNOSIS — I69398 Other sequelae of cerebral infarction: Secondary | ICD-10-CM | POA: Diagnosis not present

## 2024-01-10 DIAGNOSIS — M19042 Primary osteoarthritis, left hand: Secondary | ICD-10-CM | POA: Diagnosis not present

## 2024-01-10 DIAGNOSIS — I2510 Atherosclerotic heart disease of native coronary artery without angina pectoris: Secondary | ICD-10-CM | POA: Diagnosis not present

## 2024-01-16 DIAGNOSIS — I69398 Other sequelae of cerebral infarction: Secondary | ICD-10-CM | POA: Diagnosis not present

## 2024-01-16 DIAGNOSIS — F4321 Adjustment disorder with depressed mood: Secondary | ICD-10-CM | POA: Diagnosis not present

## 2024-01-16 DIAGNOSIS — I69354 Hemiplegia and hemiparesis following cerebral infarction affecting left non-dominant side: Secondary | ICD-10-CM | POA: Diagnosis not present

## 2024-01-16 DIAGNOSIS — I2510 Atherosclerotic heart disease of native coronary artery without angina pectoris: Secondary | ICD-10-CM | POA: Diagnosis not present

## 2024-01-16 DIAGNOSIS — R2689 Other abnormalities of gait and mobility: Secondary | ICD-10-CM | POA: Diagnosis not present

## 2024-01-16 DIAGNOSIS — I129 Hypertensive chronic kidney disease with stage 1 through stage 4 chronic kidney disease, or unspecified chronic kidney disease: Secondary | ICD-10-CM | POA: Diagnosis not present

## 2024-01-16 DIAGNOSIS — M19042 Primary osteoarthritis, left hand: Secondary | ICD-10-CM | POA: Diagnosis not present

## 2024-01-16 DIAGNOSIS — N1831 Chronic kidney disease, stage 3a: Secondary | ICD-10-CM | POA: Diagnosis not present

## 2024-01-16 DIAGNOSIS — M19041 Primary osteoarthritis, right hand: Secondary | ICD-10-CM | POA: Diagnosis not present

## 2024-01-17 DIAGNOSIS — I69354 Hemiplegia and hemiparesis following cerebral infarction affecting left non-dominant side: Secondary | ICD-10-CM | POA: Diagnosis not present

## 2024-01-17 DIAGNOSIS — I2510 Atherosclerotic heart disease of native coronary artery without angina pectoris: Secondary | ICD-10-CM | POA: Diagnosis not present

## 2024-01-17 DIAGNOSIS — I129 Hypertensive chronic kidney disease with stage 1 through stage 4 chronic kidney disease, or unspecified chronic kidney disease: Secondary | ICD-10-CM | POA: Diagnosis not present

## 2024-01-17 DIAGNOSIS — N1831 Chronic kidney disease, stage 3a: Secondary | ICD-10-CM | POA: Diagnosis not present

## 2024-01-17 DIAGNOSIS — F4321 Adjustment disorder with depressed mood: Secondary | ICD-10-CM | POA: Diagnosis not present

## 2024-01-17 DIAGNOSIS — M19041 Primary osteoarthritis, right hand: Secondary | ICD-10-CM | POA: Diagnosis not present

## 2024-01-17 DIAGNOSIS — R2689 Other abnormalities of gait and mobility: Secondary | ICD-10-CM | POA: Diagnosis not present

## 2024-01-17 DIAGNOSIS — M19042 Primary osteoarthritis, left hand: Secondary | ICD-10-CM | POA: Diagnosis not present

## 2024-01-17 DIAGNOSIS — I69398 Other sequelae of cerebral infarction: Secondary | ICD-10-CM | POA: Diagnosis not present

## 2024-01-19 DIAGNOSIS — Z79899 Other long term (current) drug therapy: Principal | ICD-10-CM

## 2024-01-19 DIAGNOSIS — R21 Rash and other nonspecific skin eruption: Principal | ICD-10-CM

## 2024-01-19 DIAGNOSIS — L409 Psoriasis, unspecified: Principal | ICD-10-CM

## 2024-01-19 MED ORDER — CLOBETASOL 0.05 % SCALP SOLUTION
TOPICAL | 5 refills | 0.00000 days | Status: CP
Start: 2024-01-19 — End: ?

## 2024-01-19 MED ORDER — TRIAMCINOLONE ACETONIDE 0.1 % TOPICAL OINTMENT
TOPICAL | 0 refills | 0.00000 days | Status: CP
Start: 2024-01-19 — End: ?

## 2024-01-19 NOTE — Unmapped (Signed)
 Diane Pugh's daughter reports her mom has had some improvement in itching but no real skin clearance yet. She continues to use topicals - I encouraged use and reviewed these can be more helpful with a biologic on board. Her topical prescriptions are set to expire next week - will message clinic to see if they can send in refills for her.       [facility] Clinical Assessment & Refill Coordination Note    Diane Pugh, DOB: [birth year]  Phone: There are no phone numbers on file.    All above HIPAA information was verified with patient's family member, daughter, Diane Pugh.     Was a Nurse, learning disability used for this call? No    Specialty Medication(s):   Inflammatory Disorders: Skyrizi      Current Medications[1]     Changes to medications: Diane Pugh reports no changes at this time.    Medication list has been reviewed and updated in Epic: Yes    Allergies[2]    Changes to allergies: No    Allergies have been reviewed and updated in Epic: Yes    SPECIALTY MEDICATION ADHERENCE     Skyrizi  - 0 left  Medication Adherence    Patient reported X missed doses in the last month: 0  Specialty Medication: Skyrizi           Specialty medication(s) dose(s) confirmed: Regimen is correct and unchanged.     Are there any concerns with adherence? No    Adherence counseling provided? Not needed    CLINICAL MANAGEMENT AND INTERVENTION      Clinical Benefit Assessment:    Do you feel the medicine is effective or helping your condition? Yes    Clinical Benefit counseling provided? Not needed    Adverse Effects Assessment:    Are you experiencing any side effects? No    Are you experiencing difficulty administering your medicine? No    Quality of Life Assessment:    Quality of Life    Rheumatology  Oncology  Dermatology  1. What impact has your specialty medication had on the symptoms of your skin condition (i.e. itchiness, soreness, stinging)?: Minimal  2. What impact has your specialty medication had on your comfort level with your skin?: Minimal  Cystic Fibrosis          How many days over the past month did your psoriasis  keep you from your normal activities? For example, brushing your teeth or getting up in the morning. Still daily - severely limits social interaction due to severe scalp involvement    Have you discussed this with your provider? Yes    Acute Infection Status:    Acute infections noted within Epic:  No active infections    Patient reported infection: None    Therapy Appropriateness:    Is therapy appropriate based on current medication list, adverse reactions, adherence, clinical benefit and progress toward achieving therapeutic goals? Yes, therapy is appropriate and should be continued     Clinical Intervention:    Was an intervention completed as part of this clinical assessment? No    DISEASE/MEDICATION-SPECIFIC INFORMATION      For patients on injectable medications: Patient currently has 0 doses left.  Next injection is scheduled for 8.5.    Chronic Inflammatory Diseases: Have you experienced any flares in the last month? No  Has this been reported to your provider? No    PATIENT SPECIFIC NEEDS     Does the patient have any physical, cognitive, or cultural  barriers? No    Is the patient high risk? No    Does the patient require physician intervention or other additional services (i.e., nutrition, smoking cessation, social work)? No    Does the patient have an additional or emergency contact listed in their chart? Yes    SOCIAL DETERMINANTS OF HEALTH     At the [facility], we have learned that life circumstances - like trouble affording food, housing, utilities, or transportation can affect the health of many of our patients.   That is why we wanted to ask: are you currently experiencing any life circumstances that are negatively impacting your health and/or quality of life? Patient declined to answer    Social Drivers of Health     Food Insecurity: No Food Insecurity (01/25/2023)    Received from [facility] Hunger Vital Sign     Worried About Running Out of Food in the Last Year: Never true     Ran Out of Food in the Last Year: Never true   Tobacco Use: High Risk (06/16/2023)    Patient History     Smoking Tobacco Use: Every Day     Smokeless Tobacco Use: Never     Passive Exposure: Current   Transportation Needs: No Transportation Needs (01/25/2023)    Received from [facility] - Transportation     Lack of Transportation (Medical): No     Lack of Transportation (Non-Medical): No   Alcohol Use: Not on file   Housing: Patient Declined (11/23/2022)    Received from [facility]    Housing Stability Vital Sign     Unable to Pay for Housing in the Last Year: Patient declined     Number of Times Moved in the Last Year: Not on file     Homeless in the Last Year: Patient declined   Physical Activity: Unknown (06/09/2017)    Received from [facility]    Exercise Vital Sign     Days of Exercise per Week: Patient declined     Minutes of Exercise per Session: Patient declined   Utilities: Not At Risk (01/25/2023)    Received from [facility] Utilities     Threatened with loss of utilities: No   Stress: No Stress Concern Present (06/09/2017)    Received from [facility] - Occupational Stress Questionnaire     Feeling of Stress : Not at all   Interpersonal Safety: Not on file   Substance Use: Not on file (05/03/2023)   Intimate Partner Violence: Not At Risk (01/25/2023)    Received from [facility]    Humiliation, Afraid, Rape, and Kick questionnaire     Fear of Current or Ex-Partner: No     Emotionally Abused: No     Physically Abused: No     Sexually Abused: No   Social Connections: Unknown (06/09/2017)    Received from [facility]    Social Connection and Isolation Panel     Frequency of Communication with Friends and Family: Patient declined     Frequency of Social Gatherings with Friends and Family: Patient declined     Attends Religious Services: Patient declined     Active Member of Clubs or Organizations: Patient declined     Attends Banker Meetings: Patient declined     Marital Status: Patient declined   Physicist, medical Strain: Patient Declined (11/23/2022)    Received from  [facility]    Overall Financial Resource Strain (CARDIA)     Difficulty of Paying Living Expenses: Patient declined   Health Literacy: Not on file   Internet Connectivity: Not on file       Would you be willing to receive help with any of the needs that you have identified today? Not applicable       SHIPPING     Specialty Medication(s) to be Shipped:   Inflammatory Disorders: Skyrizi     Other medication(s) to be shipped: No additional medications requested for fill at this time     Changes to insurance: No    Cost and Payment: Patient has a $0 copay, payment information is not required. - on M3P    Delivery Scheduled: Yes, Expected medication delivery date: 7/29.     Medication will be delivered via UPS to the confirmed prescription address in [facility].    The patient will receive a drug information handout for each medication shipped and additional FDA Medication Guides as required.  Verified that patient has previously received a Conservation officer, historic buildings and a Surveyor, mining.    The patient or caregiver noted above participated in the development of this care plan and knows that they can request review of or adjustments to the care plan at any time.      All of the patient's questions and concerns have been addressed.    Lashai Grosch [facility] Specialty Pharmacist         [1]   Current Outpatient Medications   Medication Sig Dispense Refill    amlodipine (NORVASC) 10 MG tablet Take 1 tablet (10 mg total) by mouth daily.      aspirin (ECOTRIN) 81 MG tablet Take 1 tablet (81 mg total) by mouth.      atorvastatin (LIPITOR) 40 MG tablet Take 1 tablet (40 mg total) by mouth.      citalopram (CELEXA) 20 MG tablet TAKE 1 TABLET EVERY NIGHT      clobetasol  (TEMOVATE ) 0.05 % external solution Continue to apply twice daily to the scalp for itching 50 mL 5    clopidogrel (PLAVIX) 75 mg tablet TAKE 1 TABLET EVERY DAY      empty container Misc Use as directed to dispose of Skyrizi  pens. 1 each 2    folic acid  (FOLVITE ) 1 MG tablet TAKE 1 -2 MG 6 DAYS A WEEK (Patient not taking: Reported on 06/16/2023) 100 tablet 3    hydroCHLOROthiazide (MICROZIDE) 12.5 mg capsule Take 1 capsule (12.5 mg total) by mouth.      ibuprofen (ADVIL,MOTRIN) 800 MG tablet Take 1 tablet (800 mg total) by mouth.      lisinopril (PRINIVIL,ZESTRIL) 20 MG tablet TAKE 1 TABLET EVERY DAY      melatonin 5 mg TbDL       potassium 99 mg Tab Take 0.5 tablets by mouth. (Patient not taking: Reported on 06/16/2023)      pregabalin (LYRICA) 100 MG capsule Take 1 capsule (100 mg total) by mouth. (Patient not taking: Reported on 06/16/2023)      risankizumab -rzaa (SKYRIZI ) 150 mg/mL PnIj Inject  the contents of 1 pen (150 mg) into the skin at weeks 0, 4 and then every 12 weeks thereafter 2 mL 0    risankizumab -rzaa (SKYRIZI ) 150 mg/mL PnIj MAINTENANCE SUBQ: inject the contents of 1 pen (150mg ) into the skin every 12 weeks. 1 mL 3    traMADol (ULTRAM) 50 mg  tablet Take 1 tablet (50 mg total) by mouth two (2) times a day as needed.      traZODone (DESYREL) 50 MG tablet Take 1 tablet (50 mg total) by mouth nightly.      triamcinolone  (KENALOG ) 0.1 % ointment Apply to affected area twice daily until improved or for up to 2 weeks, whichever is sooner. Break for 1-2 weeks. Restart as needed. 454 g 0     No current facility-administered medications for this visit.   [2] No Known Allergies

## 2024-01-23 NOTE — Unmapped (Signed)
 Joen JONELLE Molt 's SKYRIZI  150 mg/mL Pnij (risankizumab -rzaa) shipment will be rescheduled as a result of the medication is too soon to refill until 01/26/24.     I have reached out to the patient  at [phone] and communicated the delivery change. We will reschedule the medication for the delivery date that the patient agreed upon.  We have confirmed the delivery date as 01/27/24

## 2024-01-26 MED FILL — SKYRIZI 150 MG/ML SUBCUTANEOUS PEN INJECTOR: SUBCUTANEOUS | 84 days supply | Qty: 1 | Fill #1

## 2024-04-17 DIAGNOSIS — L400 Psoriasis vulgaris: Principal | ICD-10-CM

## 2024-04-17 MED ORDER — RISANKIZUMAB-RZAA 150 MG/ML SUBCUTANEOUS PEN INJECTOR
SUBCUTANEOUS | 2 refills | 0.00000 days | Status: CP
Start: 2024-04-17 — End: ?
  Filled 2024-05-03: qty 1, 84d supply, fill #0

## 2024-04-23 NOTE — Progress Notes (Deleted)
 [facility] [address] [place]. [place] [address] [phone]       OFFICE FOLLOW UP NOTE  [name] Date of Birth:  03/08/1946 Medical Record Number:  [id number]   Primary neurologist: Dr. Rosemarie Reason for visit: Stroke follow-up   No chief complaint on file.     HPI:   Update 04/24/2024 JM: Patient returns for follow-up visit after prior visit with Dr. Rosemarie almost 1 year ago (although 3 mo f/u visit recommended).  Overall stable without new stroke/TIA symptoms.       Consult visit 05/10/2023 Dr. Rosemarie: [name] is a 78 year old pleasant Caucasian lady seen today for office consultation visit for stroke.  History is obtained from the patient and her daughter is accompanying her as well as review of referral notes and electronic medical records.  I personally reviewed pertinent available imaging films in PACS.  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  She presented initially in April 2024 with admission for UTI and hypotension with systolic blood pressure dropping to 70.  During this episode she was noted to be dysarthric with left facial droop.  MRI showed new acute to subacute infarcts in the periventricular white matter in the right frontal lobe and periatrial white matter.  CT angiogram showed multifocal severe stenosis involving different intracranial vessels.  She presented again in July 2024 this time with difficulty walking with bilateral leg weakness following a fall with some hip pain.  Initial hip imaging showed a contusion without fracture but subsequently she was noted to have a small fracture.  She had left-sided weakness at this time.  MRI scan on 01/24/2023 showed multiple small foci of acute ischemia in the right basal ganglia, posterior limb of internal capsule and right caudate head.  There are also  changes of chronic microhemorrhages and small vessel disease. CT angiogram of the brain and neck showed chronic left M1 occlusion as well as multifocal moderate stenosis of the right MCA M2 and M3 branches.  There was mild bilateral carotid bifurcation atherosclerosis without significant stenosis.  Echocardiogram showed ejection fraction of 70 to 75%.  Left atrium size is normal.  Bubble study was negative for right-to-left shunt.  LDL cholesterol 03/08/2023 was 69 mg percent.  Patient was continued on aspirin  and Plavix .  She has finished some home physical occupational therapy.  She is still has trouble walking she has use a walker but the daughter does not trust her and is usually closely behind her.  She is not sure if her walking difficulty is limited by her hip pain and for which she needs surgery or evaluated as weakness from her stroke.  She was offered outpatient physical Occupational Therapy but they were not happy with the place and hence did not finish the full course of therapy.  She is tolerating aspirin  and Plavix  well without bruit bleeding or bruising.  She is starting Lipitor  well without muscle aches and pains.  Blood pressure is under good control.  Patient is living with her daughter.  She can get up and walk by her self for a short distance but is dependent on her daughter for a lot of activities of daily living.     ROS:   14 system review of systems is positive for hip pain, leg weakness, difficulty walking, imbalance all other systems negative  PMH:  Past Medical History:  Diagnosis  Date   Arthritis    knees, hands   H/O thyroid  disease    No issues currently   HOH (hard of hearing)    Hypertension    Stroke (HCC) 12/2018   Wears hearing aid in both ears    has, does not wear    Social History:  Social History   Socioeconomic History   Marital status: Divorced    Spouse name: Not on file   Number of children: Not on file   Years of education: Not on file    Highest education level: Not on file  Occupational History   Not on file  Tobacco Use   Smoking status: Every Day    Current packs/day: 0.50    Average packs/day: 0.5 packs/day for 38.0 years (19.0 ttl pk-yrs)    Types: Cigarettes    Start date: 03/10/1987   Smokeless tobacco: Never   Tobacco comments:    since age 78  Vaping Use   Vaping status: Never Used  Substance and Sexual Activity   Alcohol use: No   Drug use: No   Sexual activity: Not on file  Other Topics Concern   Not on file  Social History Narrative   Not on file   Social Drivers of Health   Financial Resource Strain: Low Risk  (11/24/2023)   Received from [facility]   Overall Financial Resource Strain (CARDIA)    Difficulty of Paying Living Expenses: Not very hard  Food Insecurity: No Food Insecurity (11/24/2023)   Received from [facility]   Hunger Vital Sign    Within the past 12 months, you worried that your food would run out before you got the money to buy more.: Never true    Within the past 12 months, the food you bought just didn't last and you didn't have money to get more.: Never true  Transportation Needs: No Transportation Needs (11/24/2023)   Received from [facility] - Transportation    In the past 12 months, has lack of transportation kept you from medical appointments or from getting medications?: No    Lack of Transportation (Non-Medical): No  Physical Activity: Unknown (06/09/2017)   Exercise Vital Sign    Days of Exercise per Week: Patient declined    Minutes of Exercise per Session: Patient declined  Stress: No Stress Concern Present (06/09/2017)   [employer] [facility] Occupational [facility] - Occupational Stress Questionnaire    Feeling of Stress : Not at all  Social Connections: Unknown (06/09/2017)   Social Connection and Isolation Panel    Frequency of Communication with Friends and Family: Patient declined    Frequency of  Social Gatherings with Friends and Family: Patient declined    Attends Religious Services: Patient declined    Database Administrator or Organizations: Patient declined    Attends Banker Meetings: Patient declined    Marital Status: Patient declined  Intimate Partner Violence: Not At Risk (01/25/2023)   Humiliation, Afraid, Rape, and Kick questionnaire    Fear of Current or Ex-Partner: No    Emotionally Abused: No    Physically Abused: No    Sexually Abused: No    Medications:   Current Outpatient Medications on File Prior to Visit  Medication Sig Dispense Refill   amLODipine  (NORVASC ) 5 MG tablet Take 2 tablets (10 mg total) by mouth at bedtime as needed (Systolic BP greater than 150 mmHg). 60 tablet 11   aspirin  EC  81 MG tablet Take 81 mg by mouth daily. Swallow whole.     atorvastatin  (LIPITOR ) 40 MG tablet Take 1 tablet (40 mg total) by mouth daily. 30 tablet 0   clobetasol (TEMOVATE) 0.05 % external solution Continue to apply twice daily to the scalp for itching     lisinopril  (ZESTRIL ) 20 MG tablet Take 1 tablet (20 mg total) by mouth daily. Skip the dose if systolic BP less than 130 mmHg 30 tablet 11   meclizine  (ANTIVERT ) 12.5 MG tablet Take 12.5 mg by mouth 3 (three) times daily as needed for dizziness.     mirtazapine  (REMERON ) 15 MG tablet Take 15 mg by mouth at bedtime. (Patient not taking: Reported on 05/10/2023)     No current facility-administered medications on file prior to visit.    Allergies:  No Known Allergies  Physical Exam There were no vitals filed for this visit. There is no height or weight on file to calculate BMI.   General: Frail elderly Caucasian lady seated, in no evident distress Head: head normocephalic and atraumatic.   Neck: supple with no carotid or supraclavicular bruits Cardiovascular: regular rate and rhythm, no murmurs Musculoskeletal: no deformity Skin:  no rash/petichiae Vascular:  Normal pulses all  extremities  Neurologic Exam Mental Status: Awake and fully alert. Oriented to place and time. Recent and remote memory intact. Attention span, concentration and fund of knowledge appropriate. Mood and affect appropriate.  Cranial Nerves: Pupils equal, briskly reactive to light. Extraocular movements full without nystagmus. Visual fields full to confrontation. Hearing intact. Facial sensation intact.  Mild lower left facial weakness, tongue, palate moves normally and symmetrically.  Motor: Bilateral hip flexor weakness with lower extremity drift.  Mild weakness of left hip flexor, ankle dorsiflexors.  Diminished fine finger movements on the left.  Orbits right over left upper extremity.  Mild left grip weakness.  Normal strength in the right upper extremity. Sensory.: intact to touch , pinprick , position and vibratory sensation.  Coordination: Rapid alternating movements normal in all extremities. Finger-to-nose and heel-to-shin performed accurately bilaterally. Gait and Station: Deferred as patient did not bring her walker and is in a wheelchair.   Reflexes: 2+ and asymmetric and brisker on the left. Toes downgoing.        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  She has multifocal gait difficulty secondary to combination of hip fracture and pain as well as deficits from her stroke.  Vascular risk factors of hypertension, hyperlipidemia, smoking, previous stroke and intracranial stenosis     PLAN:  -Continue aspirin  81 mg daily and atorvastatin  40 mg daily for secondary stroke prevention manner/prescribed by PCP - Discussed close PCP follow-up for aggressive stroke risk factor management   I had a long d/w patient and her daughter about her recent stroke, intracranial stenosis, risk for recurrent stroke/TIAs, personally independently reviewed imaging studies and stroke  evaluation results and answered questions.patient is not a candidate for angioplasty stenting at this time given multi focal stenosis and poor baseline function.  Continue aspirin  81 mg daily and clopidogrel  75 mg daily  for secondary stroke prevention and maintain strict control of hypertension with blood pressure goal below 130/90, diabetes with hemoglobin A1c goal below 6.5% and lipids with LDL cholesterol goal below 70 mg/dL. I also advised the patient to eat a healthy diet with plenty of whole grains, cereals, fruits and vegetables, exercise regularly and maintain ideal body  weight .patient may also consider possible participation in the  CAPTIVA stroke prevention study (aspirin  and Plavix  versus aspirin  and Brilinta versus aspirin  and low-dose Xarelto ) if interested and was given information to review at home and decide.  She was also counseled to quit smoking completely.  She was encouraged to use her walker at all times and we discussed fall and safety precautions.  Referred for outpatient physical and Occupational Therapy to improve gait and balance.  Followup in the future with my nurse practitioner in 3 months or call earlier if necessary.  Greater than 50% time during this 45-minute consultation visit was spent in counseling and coordination of care about her stroke and intracranial stenosis and discussion about treatment and answering questions   I personally spent a total of *** minutes in the care of the patient today including {Time Based Coding:210964241}.  Harlene Bogaert, AGNP-BC  [facility] [address] [place], [place] [address]  Phone [phone] Fax [phone] Note: This document was prepared with digital dictation and possible smart phrase technology. Any transcriptional errors that result from this process are unintentional.

## 2024-04-24 ENCOUNTER — Ambulatory Visit: Admitting: Adult Health

## 2024-05-02 NOTE — Progress Notes (Signed)
 [facility] Refill Coordination Note    Specialty Medication(s) to be Shipped:   Inflammatory Disorders: Skyrizi     Other medication(s) to be shipped: No additional medications requested for fill at this time    Specialty Medications not needed at this time: N/A     Diane Pugh, DOB: 1945/11/26  Phone: There are no phone numbers on file.      All above HIPAA information was verified with patient.     Was a nurse, learning disability used for this call? No    Completed refill call assessment today to schedule patient's medication shipment from the [facility]  [phone]).  All relevant notes have been reviewed.     Specialty medication(s) and dose(s) confirmed: Regimen is correct and unchanged.   Changes to medications: [name] reports no changes at this time.  Changes to insurance: No  New side effects reported not previously addressed with a pharmacist or physician: None reported  Questions for the pharmacist: No    Confirmed patient received a Conservation Officer, Historic Buildings and a Surveyor, Mining with first shipment. The patient will receive a drug information handout for each medication shipped and additional FDA Medication Guides as required.       DISEASE/MEDICATION-SPECIFIC INFORMATION        For patients on injectable medications: Next injection is scheduled for 05/05/24.    SPECIALTY MEDICATION ADHERENCE     Medication Adherence    Patient reported X missed doses in the last month: 0  Specialty Medication: SKYRIZI  150 mg/mL Pnij (risankizumab -rzaa)  Patient is on additional specialty medications: No              Were doses missed due to medication being on hold? No    risankizumab -rzaa 150 mg/mL Pnij (SKYRIZI ): 0 doses of medicine on hand       REFERRAL TO PHARMACIST     Referral to the pharmacist: Not needed      [facility]     Shipping address confirmed in Epic.     Cost and Payment: Patient has a $0 copay, payment information is not required.    Delivery Scheduled: Yes, Expected medication delivery date: 05/04/24.     Medication will be delivered via UPS to the prescription address in Epic WAM.    Diane Pugh   [facility]  Specialty Technician

## 2024-05-28 DIAGNOSIS — I5032 Chronic diastolic (congestive) heart failure: Secondary | ICD-10-CM | POA: Diagnosis not present

## 2024-05-28 DIAGNOSIS — L409 Psoriasis, unspecified: Secondary | ICD-10-CM | POA: Diagnosis not present

## 2024-05-28 DIAGNOSIS — N1831 Chronic kidney disease, stage 3a: Secondary | ICD-10-CM | POA: Diagnosis not present

## 2024-05-28 DIAGNOSIS — F4321 Adjustment disorder with depressed mood: Secondary | ICD-10-CM | POA: Diagnosis not present

## 2024-05-28 DIAGNOSIS — B372 Candidiasis of skin and nail: Secondary | ICD-10-CM | POA: Diagnosis not present

## 2024-05-28 DIAGNOSIS — D751 Secondary polycythemia: Secondary | ICD-10-CM | POA: Diagnosis not present

## 2024-08-02 NOTE — Telephone Encounter (Signed)
 Received message on nurse line from daughter stating she is unable to schedule delivery of patient's Skyrizi .   TC to daughter, [place]. She does not have the correct phone number for [facility] pharmacy in order to schedule delivery of medication. Provided correct number.   Also informed her that patient will need a follow up appointment and we will have our scheduling team reach out to make an appointment.   Ms. Alvidrez expressed appreciation for call.
